# Patient Record
Sex: Male | Born: 1945 | Race: Black or African American | Hispanic: No | Marital: Single | Smoking: Never smoker
Health system: Southern US, Community
[De-identification: ages and names within clinical notes are randomized; demographics above are authoritative.]

## PROBLEM LIST (undated history)

## (undated) DIAGNOSIS — E785 Hyperlipidemia, unspecified: Secondary | ICD-10-CM

## (undated) DIAGNOSIS — I639 Cerebral infarction, unspecified: Secondary | ICD-10-CM

## (undated) DIAGNOSIS — B059 Measles without complication: Secondary | ICD-10-CM

## (undated) DIAGNOSIS — C801 Malignant (primary) neoplasm, unspecified: Secondary | ICD-10-CM

## (undated) DIAGNOSIS — F0390 Unspecified dementia without behavioral disturbance: Secondary | ICD-10-CM

## (undated) DIAGNOSIS — I10 Essential (primary) hypertension: Secondary | ICD-10-CM

## (undated) DIAGNOSIS — B019 Varicella without complication: Secondary | ICD-10-CM

## (undated) DIAGNOSIS — B269 Mumps without complication: Secondary | ICD-10-CM

## (undated) DIAGNOSIS — I519 Heart disease, unspecified: Secondary | ICD-10-CM

## (undated) DIAGNOSIS — F329 Major depressive disorder, single episode, unspecified: Secondary | ICD-10-CM

## (undated) DIAGNOSIS — F32A Depression, unspecified: Secondary | ICD-10-CM

## (undated) DIAGNOSIS — F2089 Other schizophrenia: Secondary | ICD-10-CM

## (undated) HISTORY — DX: Measles without complication: B05.9

## (undated) HISTORY — DX: Depression, unspecified: F32.A

## (undated) HISTORY — DX: Other schizophrenia: F20.89

## (undated) HISTORY — DX: Cerebral infarction, unspecified: I63.9

## (undated) HISTORY — DX: Mumps without complication: B26.9

## (undated) HISTORY — DX: Major depressive disorder, single episode, unspecified: F32.9

## (undated) HISTORY — DX: Varicella without complication: B01.9

## (undated) HISTORY — DX: Heart disease, unspecified: I51.9

---

## 2012-10-23 ENCOUNTER — Encounter (HOSPITAL_BASED_OUTPATIENT_CLINIC_OR_DEPARTMENT_OTHER): Payer: Self-pay | Admitting: *Deleted

## 2012-10-23 ENCOUNTER — Emergency Department (HOSPITAL_BASED_OUTPATIENT_CLINIC_OR_DEPARTMENT_OTHER)
Admission: EM | Admit: 2012-10-23 | Discharge: 2012-10-23 | Disposition: A | Discharge status: Home / Self Care | Payer: Medicare Other | Attending: Emergency Medicine | Admitting: Emergency Medicine

## 2012-10-23 DIAGNOSIS — Z79899 Other long term (current) drug therapy: Secondary | ICD-10-CM | POA: Insufficient documentation

## 2012-10-23 DIAGNOSIS — F3289 Other specified depressive episodes: Secondary | ICD-10-CM | POA: Insufficient documentation

## 2012-10-23 DIAGNOSIS — F0390 Unspecified dementia without behavioral disturbance: Secondary | ICD-10-CM | POA: Insufficient documentation

## 2012-10-23 DIAGNOSIS — I10 Essential (primary) hypertension: Secondary | ICD-10-CM | POA: Insufficient documentation

## 2012-10-23 DIAGNOSIS — F329 Major depressive disorder, single episode, unspecified: Secondary | ICD-10-CM | POA: Insufficient documentation

## 2012-10-23 DIAGNOSIS — E785 Hyperlipidemia, unspecified: Secondary | ICD-10-CM | POA: Insufficient documentation

## 2012-10-23 HISTORY — DX: Hyperlipidemia, unspecified: E78.5

## 2012-10-23 HISTORY — DX: Unspecified dementia, unspecified severity, without behavioral disturbance, psychotic disturbance, mood disturbance, and anxiety: F03.90

## 2012-10-23 HISTORY — DX: Essential (primary) hypertension: I10

## 2012-10-23 LAB — BASIC METABOLIC PANEL
BUN: 11 mg/dL (ref 6–23)
CO2: 28 mEq/L (ref 19–32)
Chloride: 100 mEq/L (ref 96–112)
Creatinine, Ser: 0.8 mg/dL (ref 0.50–1.35)
GFR calc Af Amer: >90 mL/min (ref >90)
Potassium: 3.9 mEq/L (ref 3.5–5.1)

## 2012-10-23 LAB — URINALYSIS, ROUTINE W REFLEX MICROSCOPIC
Bilirubin Urine: NEGATIVE
Glucose, UA: NEGATIVE mg/dL
Ketones, ur: NEGATIVE mg/dL
Leukocytes, UA: NEGATIVE
Nitrite: NEGATIVE
Specific Gravity, Urine: 1.014 (ref 1.005–1.030)
pH: 6.5 (ref 5.0–8.0)

## 2012-10-23 NOTE — ED Provider Notes (Signed)
CSN: [id number]     Arrival date & time 10/23/12  1833 History  This chart was scribed for Ethelda Chick, MD by Bennett Scrape, ED Scribe. This patient was seen in room MH11/MH11 and the patient's care was started at 7:49 PM.   First MD Initiated Contact with Patient 10/23/12 1927     Chief Complaint  Patient presents with  . Hypertension    Patient is a 67 y.o. male presenting with hypertension. The history is provided by the patient. No language interpreter was used.  Hypertension This is a chronic problem. The current episode started less than 1 hour ago. The problem has been gradually improving. Pertinent negatives include no chest pain, no headaches and no shortness of breath. Nothing aggravates the symptoms. Nothing relieves the symptoms. He has tried nothing (ran out of meds today) for the symptoms.    HPI Comments: Ronald [name] is a 67 y.o. male with a h/o HTN who presents to the Emergency Department sent by [facility] for HTN. He states that he was at a regular appointment today and had a BP of 212 systolically. He was told to come to the ED for evaluation. He reports that he ran out of his metoprolol today, but he has the prescription called in and needs to go pick it up. He denies any missed doses. BP in the ED is 188/78. He denies CP, SOB, HA, dizziness and lightheadedness as associated symptoms.   Past Medical History  Diagnosis Date  . Hypertension   . Dementia   . Depressed   . Hyperlipemia    History reviewed. No pertinent past surgical history. History reviewed. No pertinent family history. History  Substance Use Topics  . Smoking status: Never Smoker   . Smokeless tobacco: Not on file  . Alcohol Use: No    Review of Systems  Respiratory: Negative for shortness of breath.   Cardiovascular: Negative for chest pain.  Neurological: Negative for dizziness, light-headedness and headaches.  All other systems reviewed and are  negative.    Allergies  Review of patient's allergies indicates no known allergies.  Home Medications   Current Outpatient Rx  Name  Route  Sig  Dispense  Refill  . ARIPiprazole (ABILIFY) 10 MG tablet   Oral   Take 10 mg by mouth daily.         . metoprolol (LOPRESSOR) 50 MG tablet   Oral   Take 50 mg by mouth 2 (two) times daily.         . simvastatin (ZOCOR) 40 MG tablet   Oral   Take 40 mg by mouth every evening.          Triage Vitals: BP 178/80  Pulse 75  Temp(Src) 98.6 F (37 C) (Oral)  Resp 16  Ht 5\' 7"  (1.702 m)  Wt 250 lb (113.399 kg)  BMI 39.15 kg/m2  SpO2 99%  Physical Exam  Nursing note and vitals reviewed. Constitutional: He is oriented to person, place, and time. He appears well-developed and well-nourished. No distress.  HENT:  Head: Normocephalic and atraumatic.  Eyes: Conjunctivae and EOM are normal.  Neck: Normal range of motion. Neck supple. No tracheal deviation present.  Cardiovascular: Normal rate and regular rhythm.   No murmur heard. Pulmonary/Chest: Effort normal and breath sounds normal. No respiratory distress. He has no wheezes. He has no rales.  Abdominal: Soft. Bowel sounds are normal. There is no tenderness.  Musculoskeletal: Normal range of motion. He exhibits no edema.  Neurological: He is alert and oriented to person, place, and time.  Skin: Skin is warm and dry.  Psychiatric: He has a normal mood and affect. His behavior is normal.    ED Course   Procedures (including critical care time)  DIAGNOSTIC STUDIES: Oxygen Saturation is 99% on room air, normal by my interpretation.    COORDINATION OF CARE: 7:52 PM-Discussed treatment plan which includes BMP and UA with pt at bedside and pt agreed to plan. Advised pt that discharge plan will be picking up prescription.    Date: 10/23/2012  Rate: 62  Rhythm: normal sinus rhythm  QRS Axis: normal  Intervals: normal  ST/T Wave abnormalities: normal  Conduction  Disutrbances: none  Narrative Interpretation: unremarkable, no prior ekg available     Labs Reviewed  BASIC METABOLIC PANEL - Abnormal; Notable for the following:    Glucose, Bld 102 (*)    All other components within normal limits  URINALYSIS, ROUTINE W REFLEX MICROSCOPIC   No results found. 1. Hypertension     MDM  Pt presenting with asymptomatic hypertension.  He was referred to the ED from mental health clinic.  He has rx written and he is going to pick them up later today.  No evidence of end organ damage.  Discharged with strict return precautions.  Pt agreeable with plan.  I personally performed the services described in this documentation, which was scribed in my presence. The recorded information has been reviewed and is accurate.    Ethelda Chick, MD 10/24/12 1534

## 2012-10-23 NOTE — ED Notes (Signed)
Sent here from " mental health" for increased BP

## 2013-02-26 ENCOUNTER — Telehealth: Payer: Self-pay | Admitting: Physician Assistant

## 2013-02-26 ENCOUNTER — Ambulatory Visit (INDEPENDENT_AMBULATORY_CARE_PROVIDER_SITE_OTHER): Payer: Medicare Other | Admitting: Physician Assistant

## 2013-02-26 ENCOUNTER — Encounter: Payer: Self-pay | Admitting: Physician Assistant

## 2013-02-26 VITALS — BP 174/84 | HR 98 | Temp 98.3°F | Resp 18 | Ht 65.25 in | Wt 254.0 lb

## 2013-02-26 DIAGNOSIS — I10 Essential (primary) hypertension: Secondary | ICD-10-CM

## 2013-02-26 DIAGNOSIS — E785 Hyperlipidemia, unspecified: Secondary | ICD-10-CM

## 2013-02-26 DIAGNOSIS — R739 Hyperglycemia, unspecified: Secondary | ICD-10-CM

## 2013-02-26 DIAGNOSIS — R972 Elevated prostate specific antigen [PSA]: Secondary | ICD-10-CM

## 2013-02-26 DIAGNOSIS — Z0000 Encounter for general adult medical examination without abnormal findings: Secondary | ICD-10-CM

## 2013-02-26 DIAGNOSIS — K219 Gastro-esophageal reflux disease without esophagitis: Secondary | ICD-10-CM

## 2013-02-26 DIAGNOSIS — N400 Enlarged prostate without lower urinary tract symptoms: Secondary | ICD-10-CM

## 2013-02-26 DIAGNOSIS — Z23 Encounter for immunization: Secondary | ICD-10-CM

## 2013-02-26 LAB — COMPREHENSIVE METABOLIC PANEL
Albumin: 4.4 g/dL (ref 3.5–5.2)
BUN: 12 mg/dL (ref 6–23)
CO2: 26 mEq/L (ref 19–32)
Calcium: 9.8 mg/dL (ref 8.4–10.5)
Glucose, Bld: 127 mg/dL — ABNORMAL HIGH (ref 70–99)
Potassium: 4.2 mEq/L (ref 3.5–5.3)
Sodium: 136 mEq/L (ref 135–145)
Total Protein: 7.7 g/dL (ref 6.0–8.3)

## 2013-02-26 LAB — CBC WITH DIFFERENTIAL/PLATELET
HCT: 41.2 % (ref 39.0–52.0)
Hemoglobin: 13.9 g/dL (ref 13.0–17.0)
Lymphs Abs: 1.7 10*3/uL (ref 0.7–4.0)
MCH: 29.4 pg (ref 26.0–34.0)
MCHC: 33.7 g/dL (ref 30.0–36.0)
Monocytes Absolute: 0.4 10*3/uL (ref 0.1–1.0)
Monocytes Relative: 6 % (ref 3–12)
Neutro Abs: 4.6 10*3/uL (ref 1.7–7.7)
Neutrophils Relative %: 68 % (ref 43–77)
RBC: 4.72 MIL/uL (ref 4.22–5.81)

## 2013-02-26 LAB — TSH: TSH: 1.133 u[IU]/mL (ref 0.350–4.500)

## 2013-02-26 LAB — PSA, MEDICARE: PSA: 9.82 ng/mL — ABNORMAL HIGH (ref <=4.00)

## 2013-02-26 LAB — LIPID PANEL: Cholesterol: 204 mg/dL — ABNORMAL HIGH (ref 0–200)

## 2013-02-26 MED ORDER — RANITIDINE HCL 150 MG PO CAPS: 150.0000 mg | ORAL_CAPSULE | Freq: Two times a day (BID) | ORAL | Status: DC

## 2013-02-26 MED ORDER — SIMVASTATIN 40 MG PO TABS: 40.0000 mg | ORAL_TABLET | Freq: Every evening | ORAL | Status: DC

## 2013-02-26 MED ORDER — LISINOPRIL 5 MG PO TABS: 5.0000 mg | ORAL_TABLET | Freq: Every day | ORAL | Status: DC

## 2013-02-26 MED ORDER — METOPROLOL TARTRATE 50 MG PO TABS: 50.0000 mg | ORAL_TABLET | Freq: Two times a day (BID) | ORAL | Status: DC

## 2013-02-26 NOTE — Telephone Encounter (Signed)
Please inform patient that his labs showed an elevated glucose level.  I would like to repeat this lab and an A1C in 2 weeks.  Also, patient's LDL cholesterol was slightly elevated, so he should continue Zocor for cholesterol as well as monitor his diet and attempt cardio exercise.  Patient's PSA level is significantly elevated.  I would like for him to schedule an appointment with his Urologist, Dr. Raul Del?), for further evaluation.  He needs to be assessed for BPH or prostate cancer.  If he cannot get in with his doctor, I can make a referral to a different urologist.  It is very important that he sees Urology.

## 2013-02-26 NOTE — Progress Notes (Signed)
Pre visit review using our clinic review tool, if applicable. No additional management support is needed unless otherwise documented below in the visit note/SLS  

## 2013-02-26 NOTE — Patient Instructions (Addendum)
Please obtain labs.  I will call you with your results.  Your medications have been refilled and sent to your pharmacy.  Please follow-up with [facility] Fredric [facility]are at  [facility].  Please read information on high blood pressure and the DASH diet.  Hypertension As your heart beats, it forces blood through your arteries. This force is your blood pressure. If the pressure is too high, it is called hypertension (HTN) or high blood pressure. HTN is dangerous because you may have it and not know it. High blood pressure may mean that your heart has to work harder to pump blood. Your arteries may be narrow or stiff. The extra work puts you at risk for heart disease, stroke, and other problems.  Blood pressure consists of two numbers, a higher number over a lower, 110/72, for example. It is stated as "110 over 72." The ideal is below 120 for the top number (systolic) and under 80 for the bottom (diastolic). Write down your blood pressure today. You should pay close attention to your blood pressure if you have certain conditions such as:  Heart failure.  Prior heart attack.  Diabetes  Chronic kidney disease.  Prior stroke.  Multiple risk factors for heart disease. To see if you have HTN, your blood pressure should be measured while you are seated with your arm held at the level of the heart. It should be measured at least twice. A one-time elevated blood pressure reading (especially in the Emergency Department) does not mean that you need treatment. There may be conditions in which the blood pressure is different between your right and left arms. It is important to see your caregiver soon for a recheck. Most people have essential hypertension which means that there is not a specific cause. This type of high blood pressure may be lowered by changing lifestyle factors such as:  Stress.  Smoking.  Lack of exercise.  Excessive weight.  Drug/tobacco/alcohol use.  Eating less salt. Most people do not  have symptoms from high blood pressure until it has caused damage to the body. Effective treatment can often prevent, delay or reduce that damage. TREATMENT  When a cause has been identified, treatment for high blood pressure is directed at the cause. There are a large number of medications to treat HTN. These fall into several categories, and your caregiver will help you select the medicines that are best for you. Medications may have side effects. You should review side effects with your caregiver. If your blood pressure stays high after you have made lifestyle changes or started on medicines,   Your medication(s) may need to be changed.  Other problems may need to be addressed.  Be certain you understand your prescriptions, and know how and when to take your medicine.  Be sure to follow up with your caregiver within the time frame advised (usually within two weeks) to have your blood pressure rechecked and to review your medications.  If you are taking more than one medicine to lower your blood pressure, make sure you know how and at what times they should be taken. Taking two medicines at the same time can result in blood pressure that is too low. SEEK IMMEDIATE MEDICAL CARE IF:  You develop a severe headache, blurred or changing vision, or confusion.  You have unusual weakness or numbness, or a faint feeling.  You have severe chest or abdominal pain, vomiting, or breathing problems. MAKE SURE YOU:   Understand these instructions.  Will watch your condition.  Will  get help right away if you are not doing well or get worse. Document Released: 03/15/2005 Document Revised: 06/07/2011 Document Reviewed: 11/03/2007 [facility] Patient Information 2014 [place], [place].  DASH Diet The DASH diet stands for "Dietary Approaches to Stop Hypertension." It is a healthy eating plan that has been shown to reduce high blood pressure (hypertension) in as little as 14 days, while also possibly  providing other significant health benefits. These other health benefits include reducing the risk of breast cancer after menopause and reducing the risk of type 2 diabetes, heart disease, colon cancer, and stroke. Health benefits also include weight loss and slowing kidney failure in patients with chronic kidney disease.  DIET GUIDELINES  Limit salt (sodium). Your diet should contain less than 1500 mg of sodium daily.  Limit refined or processed carbohydrates. Your diet should include mostly whole grains. Desserts and added sugars should be used sparingly.  Include small amounts of heart-healthy fats. These types of fats include nuts, oils, and tub margarine. Limit saturated and trans fats. These fats have been shown to be harmful in the body. CHOOSING FOODS  The following food groups are based on a 2000 calorie diet. See your Registered Dietitian for individual calorie needs. Grains and Grain Products (6 to 8 servings daily)  Eat More Often: Whole-wheat bread, brown rice, whole-grain or wheat pasta, quinoa, popcorn without added fat or salt (air popped).  Eat Less Often: White bread, white pasta, white rice, cornbread. Vegetables (4 to 5 servings daily)  Eat More Often: Fresh, frozen, and canned vegetables. Vegetables may be raw, steamed, roasted, or grilled with a minimal amount of fat.  Eat Less Often/Avoid: Creamed or fried vegetables. Vegetables in a cheese sauce. Fruit (4 to 5 servings daily)  Eat More Often: All fresh, canned (in natural juice), or frozen fruits. Dried fruits without added sugar. One hundred percent fruit juice ( cup [237 mL] daily).  Eat Less Often: Dried fruits with added sugar. Canned fruit in light or heavy syrup. [employer], Fish, and Poultry (2 servings or less daily. One serving is 3 to 4 oz [85-114 g]).  Eat More Often: Ninety percent or leaner ground beef, tenderloin, sirloin. Round cuts of beef, chicken breast, [place] breast. All fish. Grill, bake, or  broil your meat. Nothing should be fried.  Eat Less Often/Avoid: Fatty cuts of meat, [place], or chicken leg, thigh, or wing. Fried cuts of meat or fish. Dairy (2 to 3 servings)  Eat More Often: Low-fat or fat-free milk, low-fat plain or light yogurt, reduced-fat or part-skim cheese.  Eat Less Often/Avoid: Milk (whole, 2%).Whole milk yogurt. Full-fat cheeses. Nuts, Seeds, and Legumes (4 to 5 servings per week)  Eat More Often: All without added salt.  Eat Less Often/Avoid: Salted nuts and seeds, canned beans with added salt. Fats and Sweets (limited)  Eat More Often: Vegetable oils, tub margarines without trans fats, sugar-free gelatin. Mayonnaise and salad dressings.  Eat Less Often/Avoid: Coconut oils, palm oils, butter, stick margarine, cream, half and half, cookies, candy, pie. FOR MORE INFORMATION The Dash Diet Eating Plan: www.dashdiet.org Document Released: 03/04/2011 Document Revised: 06/07/2011 Document Reviewed: 03/04/2011 [facility] Patient Information 2014 [place], [place].

## 2013-02-26 NOTE — Progress Notes (Signed)
Patient ID: Ronald [name], male   DOB: 1945-08-31, 67 y.o.   MRN: [id number]  Subjective:   Patient here for Medicare annual wellness visit and management of other chronic and acute problems.    Risk factors:   -- Hypertension, Hyperlipidemia  Chronic Issues: (1) Hypertension -- reports being out of medication for a few months.  Denies chest pain, palpitations, vision changes, LH, syncope.  BP elevated at 174/84  (2) Hyperlipidemia -- currently on zocor 40 mg, but has been out of medication for a few months.  (3) Dementia -- followed by Neurology and Mental Health clinic for associated depression.  Is currently on Abilify and doing well on medication  (4) BPH -- Patient endorses history of enlarged prostate.  States he is followed by Urology, Dr. Lindley Magnus who is monitoring his levels.  Will need records from Urology.  Patient overdue for PSA.  Patient denies symptoms at present.   Roster of Physicians Providing Medical Care to Patient: Dr. Lindley Magnus (Urology) Dr. Maudry Diego (Gastroenterology) Marcelline Mates, PA-C (Primary Care) Sees Neurologist.  Activities of Daily Living  In your present state of health, do you have any difficulty performing the following activities? Preparing food and eating?: No  Bathing yourself: No  Getting dressed: No  Using the toilet:No  Moving around from place to place: No  In the past year have you fallen or had a near fall?:No     Home Safety: Has smoke detector and wears seat belts. 1 firearm in the home.  Is kept locked up.  No children in home. No excess sun exposure.  Diet and Exercise  Current exercise habits: Walks when the weather is warmer; has exercise tape that he uses occasionally Dietary issues discussed: Endorses well-balanced diet.  A lot of fish and chicken in diet.  Depression Screen  (Note: if answer to either of the following is "Yes", then a more complete depression screening is indicated)  Q1: Over the past two weeks, have you felt down,  depressed or hopeless? no  Q2: Over the past two weeks, have you felt little interest or pleasure in doing things?  no   The following portions of the patient's history were reviewed and updated as appropriate: allergies, current medications, past family history, past medical history, past social history, past surgical history and problem list.   Review of Systems  Review of Systems  Constitutional: Negative for fever, weight loss and malaise/fatigue.  HENT: Negative for ear discharge, ear pain, hearing loss and tinnitus.   Eyes: Negative for blurred vision, double vision, photophobia and pain.  Respiratory: Negative for cough, shortness of breath and wheezing.   Cardiovascular: Negative for chest pain and palpitations.  Gastrointestinal: Negative for heartburn, nausea, vomiting, abdominal pain, diarrhea, constipation, blood in stool and melena.  Genitourinary: Negative for dysuria, urgency, frequency, hematuria and flank pain.  Musculoskeletal: Negative for myalgias.  Neurological: Negative for dizziness, seizures, loss of consciousness and headaches.  Psychiatric/Behavioral: Positive for depression and memory loss. Negative for suicidal ideas, hallucinations and substance abuse. The patient is not nervous/anxious and does not have insomnia.     Objective:   Vision: R -- 20/30; L -- 20/40; B -- 20/30 Uncorrected. Hearing: Patient can hear a forced whisper from across the room Body mass index: Body mass index is 41.96 kg/(m^2). Cognitive Impairment Assessment: cognition, memory and judgment appear normal.   Physical Exam  Vitals reviewed. Constitutional: He is oriented to person, place, and time.  Patient is a morbidly obese, pleasant african [place] gentleman,  in no acute distress.  HENT:  Head: Normocephalic and atraumatic.  Right Ear: External ear normal.  Left Ear: External ear normal.  Nose: Nose normal.  Mouth/Throat: Oropharynx is clear and moist. No oropharyngeal exudate.   Tympanic membranes within normal limits bilaterally.  Eyes: Conjunctivae and EOM are normal. Pupils are equal, round, and reactive to light. No scleral icterus.  Neck: Neck supple.  Cardiovascular: Normal rate, regular rhythm, normal heart sounds and intact distal pulses.   Pulmonary/Chest: Effort normal and breath sounds normal. No respiratory distress. He has no wheezes. He has no rales. He exhibits no tenderness.  Abdominal: Soft. Bowel sounds are normal. He exhibits no distension and no mass. There is no tenderness. There is no rebound and no guarding.  Genitourinary:  Patient defers prostate and GU exam to his Urologist.  Lymphadenopathy:    He has no cervical adenopathy.  Neurological: He is alert and oriented to person, place, and time. No cranial nerve deficit. GCS score is 15.  Skin: Skin is warm and dry. No rash noted.  Psychiatric: Mood, memory and affect normal.   Assessment:   Medicare wellness utd on preventive parameters   (1) Hypertension (2) Hyperlipidemia (3) Dementia (4) Hx of BPH  Plan:    During the course of the visit the patient was educated and counseled about appropriate screening and preventive services including:        Fall prevention   Screening PSA Diabetes screening  Nutrition counseling   (1) BP markedly elevated at 178/86 today.  Patient asymptomatic.  Endorses has been out of medication for a couple of months.  Medications refilled.  Patient instructed to restart medications as prescribed.  DASH diet handout given to patient.  Patient counseled on weight loss.  Patient to return to office for repeat BP check in   (2) Will obtain fasting lipid panel.  Medications refilled.  (3) Patient is A/O x 3.  Judgement and Insight intact.  Will continue medication as prescribed by Neurologist.  (4) Patient followed by Urology.  Will request records.  Patient agrees to allowing [place] to check PSA since he is overdue.   Vaccines / LABS Flu vaccine  today. Will obtain CBC, CMP, Lipid, UA and PSA.  Patient Instructions (the written plan) was given to the patient.

## 2013-02-27 NOTE — Telephone Encounter (Signed)
Left message on voicemail for pt to return my call.

## 2013-02-28 ENCOUNTER — Encounter: Payer: Self-pay | Admitting: Physician Assistant

## 2013-02-28 DIAGNOSIS — Z23 Encounter for immunization: Secondary | ICD-10-CM | POA: Insufficient documentation

## 2013-02-28 DIAGNOSIS — Z0000 Encounter for general adult medical examination without abnormal findings: Secondary | ICD-10-CM | POA: Insufficient documentation

## 2013-02-28 DIAGNOSIS — E785 Hyperlipidemia, unspecified: Secondary | ICD-10-CM | POA: Insufficient documentation

## 2013-02-28 DIAGNOSIS — R972 Elevated prostate specific antigen [PSA]: Secondary | ICD-10-CM

## 2013-02-28 DIAGNOSIS — N400 Enlarged prostate without lower urinary tract symptoms: Secondary | ICD-10-CM | POA: Insufficient documentation

## 2013-02-28 DIAGNOSIS — I10 Essential (primary) hypertension: Secondary | ICD-10-CM | POA: Insufficient documentation

## 2013-02-28 NOTE — Assessment & Plan Note (Addendum)
Medications refilled.  Patient to restart medications as prescribed.  BP recheck in 2 weeks.  Gave handout on DASH diet.  Encourage diet, exercise and weight loss.

## 2013-02-28 NOTE — Assessment & Plan Note (Signed)
Will obtain fasting lipid panel.  zocor refilled.

## 2013-02-28 NOTE — Telephone Encounter (Addendum)
Patient informed, understood & agreed;OV note w/labs faxed to [facility] at [phone]; future A1C lab placed/SLS

## 2013-02-28 NOTE — Assessment & Plan Note (Signed)
History updated.  Medications refilled.  Will obtain labs.

## 2013-02-28 NOTE — Assessment & Plan Note (Signed)
Will obtain PSA

## 2013-02-28 NOTE — Assessment & Plan Note (Signed)
Flu shot given by nursing staff. 

## 2013-03-09 ENCOUNTER — Telehealth: Payer: Self-pay | Admitting: Physician Assistant

## 2013-03-09 LAB — HEMOGLOBIN A1C
Hgb A1c MFr Bld: 5.8 % — ABNORMAL HIGH (ref <5.7)
Mean Plasma Glucose: 120 mg/dL — ABNORMAL HIGH (ref <117)

## 2013-03-09 NOTE — Telephone Encounter (Signed)
HE WOULD LIKE TO [facility] AT [facility]

## 2013-03-09 NOTE — Telephone Encounter (Signed)
Pt wants to see the therapist at [facility], he just wants someone to offer counseling services, pt is aware that they don't prescribe any medication.

## 2013-03-09 NOTE — Telephone Encounter (Signed)
For counseling services, the patient needs to set up an appointment on his own.  The number is [phone].  Patient should tell scheduler he wants to see a counselor at the [place] office.

## 2013-03-09 NOTE — Telephone Encounter (Signed)
Can we call patient and see if he is wanting referral to see a therapist or for medication management.  To my knowledge, [facility] only offers counseling services.  He will need to be seen by a psychiatrist if he is wishing for medication management.  I will make a referral for him, but I need to know what services he is requesting.

## 2013-03-12 ENCOUNTER — Encounter: Payer: Self-pay | Admitting: Physician Assistant

## 2013-03-12 ENCOUNTER — Ambulatory Visit (INDEPENDENT_AMBULATORY_CARE_PROVIDER_SITE_OTHER): Payer: Medicare Other | Admitting: Physician Assistant

## 2013-03-12 VITALS — BP 158/88 | HR 82 | Temp 98.0°F | Resp 18 | Wt 258.0 lb

## 2013-03-12 DIAGNOSIS — I10 Essential (primary) hypertension: Secondary | ICD-10-CM

## 2013-03-12 MED ORDER — LISINOPRIL 10 MG PO TABS: 10.0000 mg | ORAL_TABLET | Freq: Every day | ORAL | Status: DC

## 2013-03-12 NOTE — Assessment & Plan Note (Signed)
Increase lisinopril to 10 mg daily.  Continue DASH diet.  Follow-up in 2 weeks for BP recheck.

## 2013-03-12 NOTE — Patient Instructions (Signed)
Please take two 5-mg tablets a day of your current Lisinopril prescription.  When you pick up your next prescription, it will be a bottle of 10 mg tablets.  Take 1 daily.  Return to clinic in 2 weeks for blood pressure recheck.  Please have your records sent from Mental Health so we can assess your need for medication and referral to Behavioral Health here.

## 2013-03-12 NOTE — Progress Notes (Signed)
Patient ID: Ronald [name], male   DOB: November 02, 1945, 67 y.o.   MRN: [id number]  Patient presents to clinic today for 2 week follow-up of HTN.  Patient's BP at last visit was elevated at 174/84.  Patient currently on Lisinopril 5 mg daily and Metoprolol 50 mg BID.  Patient denies chest pain, palpitations, SOB, vision changes, headache, LH or syncope.  Patient's BP elevated at 158/88 today in clinic.     Past Medical History  Diagnosis Date  . Hypertension   . Dementia   . Depressed   . Hyperlipemia   . Chicken pox   . Mumps   . Measles     Current Outpatient Prescriptions on File Prior to Visit  Medication Sig Dispense Refill  . ARIPiprazole (ABILIFY) 10 MG tablet Take 10 mg by mouth daily.      . metoprolol (LOPRESSOR) 50 MG tablet Take 1 tablet (50 mg total) by mouth 2 (two) times daily.  60 tablet  2  . ranitidine (ZANTAC) 150 MG capsule Take 1 capsule (150 mg total) by mouth 2 (two) times daily.  60 capsule  2  . simvastatin (ZOCOR) 40 MG tablet Take 1 tablet (40 mg total) by mouth every evening.  30 tablet  0   No current facility-administered medications on file prior to visit.    No Known Allergies  Family History  Problem Relation Age of Onset  . Hypertension Father   . Hyperlipidemia Father   . Congestive Heart Failure Father 51    Deceased-1995  . Diabetes Mother   . Kidney failure Mother 76    Deceased-2005  . Hypertension Mother   . Congestive Heart Failure Maternal Grandmother   . Heart failure Paternal Grandmother   . Heart attack Paternal Grandmother     History   Social History  . Marital Status: Single    Spouse Name: N/A    Number of Children: N/A  . Years of Education: N/A   Social History Main Topics  . Smoking status: Never Smoker   . Smokeless tobacco: Never Used  . Alcohol Use: No  . Drug Use: No  . Sexual Activity: No   Other Topics Concern  . None   Social History Narrative  . None    Review of Systems - See HPI.  All other ROS  are negative.   Filed Vitals:   03/12/13 0903  BP: 158/88  Pulse: 82  Temp: 98 F (36.7 C)  Resp: 18   Physical Exam  Vitals reviewed. Constitutional: He is oriented to person, place, and time and well-developed, well-nourished, and in no distress.  HENT:  Head: Normocephalic.  Eyes: Conjunctivae are normal. Pupils are equal, round, and reactive to light.  Neck: Neck supple.  Cardiovascular: Normal rate, regular rhythm, normal heart sounds and intact distal pulses.   Pulmonary/Chest: Effort normal and breath sounds normal.  Neurological: He is alert and oriented to person, place, and time.  Skin: Skin is warm and dry. No rash noted.  Psychiatric: Affect normal.     Recent Results (from the past 2160 hour(s))  CBC WITH DIFFERENTIAL     Status: None   Collection Time    02/26/13 10:30 AM      Result Value Range   WBC 6.7  4.0 - 10.5 K/uL   RBC 4.72  4.22 - 5.81 MIL/uL   Hemoglobin 13.9  13.0 - 17.0 g/dL   HCT 40.9  81.1 - 91.4 %   MCV 87.3  78.0 - 100.0  fL   MCH 29.4  26.0 - 34.0 pg   MCHC 33.7  30.0 - 36.0 g/dL   RDW 14.7  82.9 - 56.2 %   Platelets 265  150 - 400 K/uL   Neutrophils Relative % 68  43 - 77 %   Neutro Abs 4.6  1.7 - 7.7 K/uL   Lymphocytes Relative 26  12 - 46 %   Lymphs Abs 1.7  0.7 - 4.0 K/uL   Monocytes Relative 6  3 - 12 %   Monocytes Absolute 0.4  0.1 - 1.0 K/uL   Eosinophils Relative 0  0 - 5 %   Eosinophils Absolute 0.0  0.0 - 0.7 K/uL   Basophils Relative 0  0 - 1 %   Basophils Absolute 0.0  0.0 - 0.1 K/uL   Smear Review Criteria for review not met    COMPREHENSIVE METABOLIC PANEL     Status: Abnormal   Collection Time    02/26/13 10:30 AM      Result Value Range   Sodium 136  135 - 145 mEq/L   Potassium 4.2  3.5 - 5.3 mEq/L   Chloride 96  96 - 112 mEq/L   CO2 26  19 - 32 mEq/L   Glucose, Bld 127 (*) 70 - 99 mg/dL   BUN 12  6 - 23 mg/dL   Creat 1.30  8.65 - 7.84 mg/dL   Total Bilirubin 0.4  0.3 - 1.2 mg/dL   Alkaline Phosphatase 101   39 - 117 U/L   AST 30  0 - 37 U/L   ALT 33  0 - 53 U/L   Total Protein 7.7  6.0 - 8.3 g/dL   Albumin 4.4  3.5 - 5.2 g/dL   Calcium 9.8  8.4 - 69.6 mg/dL  TSH     Status: None   Collection Time    02/26/13 10:30 AM      Result Value Range   TSH 1.133  0.350 - 4.500 uIU/mL  PSA, MEDICARE     Status: Abnormal   Collection Time    02/26/13 10:30 AM      Result Value Range   PSA 9.82 (*) <=4.00 ng/mL   Comment: Test Methodology: ECLIA PSA (Electrochemiluminescence Immunoassay)           For PSA values from 2.5-4.0, particularly in younger men <60 years     old, the AUA and NCCN suggest testing for % Free PSA (3515) and     evaluation of the rate of increase in PSA (PSA velocity).  LIPID PANEL     Status: Abnormal   Collection Time    02/26/13 10:30 AM      Result Value Range   Cholesterol 204 (*) 0 - 200 mg/dL   Comment: ATP III Classification:           < 200        mg/dL        Desirable          200 - 239     mg/dL        Borderline High          >= 240        mg/dL        High         Triglycerides 119  <150 mg/dL   HDL 46  >29 mg/dL   Total CHOL/HDL Ratio 4.4     VLDL 24  0 - 40 mg/dL  LDL Cholesterol 134 (*) 0 - 99 mg/dL   Comment:       Total Cholesterol/HDL Ratio:CHD Risk                            Coronary Heart Disease Risk Table                                            Men       Women              1/2 Average Risk              3.4        3.3                  Average Risk              5.0        4.4               2X Average Risk              9.6        7.1               3X Average Risk             23.4       11.0     Use the calculated Patient Ratio above and the CHD Risk table      to determine the patient's CHD Risk.     ATP III Classification (LDL):           < 100        mg/dL         Optimal          100 - 129     mg/dL         Near or Above Optimal          130 - 159     mg/dL         Borderline High          160 - 189     mg/dL         High            > 190        mg/dL         Very High        HEMOGLOBIN A1C     Status: Abnormal   Collection Time    03/09/13  9:31 AM      Result Value Range   Hemoglobin A1C 5.8 (*) <5.7 %   Comment:                                                                            According to the ADA Clinical Practice Recommendations for 2011, when     HbA1c is used as a screening test:             >=6.5%   Diagnostic of Diabetes Mellitus                (  if abnormal result is confirmed)           5.7-6.4%   Increased risk of developing Diabetes Mellitus           References:Diagnosis and Classification of Diabetes Mellitus,Diabetes     Care,2011,34(Suppl 1):S62-S69 and Standards of Medical Care in             Diabetes - 2011,Diabetes Care,2011,34 (Suppl 1):S11-S61.         Mean Plasma Glucose 120 (*) <117 mg/dL    Assessment/Plan: Essential hypertension, benign Increase lisinopril to 10 mg daily.  Continue DASH diet.  Follow-up in 2 weeks for BP recheck.

## 2013-03-13 NOTE — Telephone Encounter (Signed)
Notified pt and he voices understanding. 

## 2013-03-26 ENCOUNTER — Ambulatory Visit (INDEPENDENT_AMBULATORY_CARE_PROVIDER_SITE_OTHER): Payer: Medicare Other | Admitting: Physician Assistant

## 2013-03-26 ENCOUNTER — Encounter: Payer: Self-pay | Admitting: Physician Assistant

## 2013-03-26 VITALS — BP 140/84 | HR 71 | Temp 98.2°F | Resp 18 | Ht 65.25 in | Wt 253.5 lb

## 2013-03-26 DIAGNOSIS — I10 Essential (primary) hypertension: Secondary | ICD-10-CM

## 2013-03-26 NOTE — Patient Instructions (Signed)
Please continue medications as prescribed.  Return to clinic in 3 months.  Return sooner if you need [place].

## 2013-03-26 NOTE — Progress Notes (Signed)
Pre visit review using our clinic review tool, if applicable. No additional management support is needed unless otherwise documented below in the visit note/SLS  

## 2013-03-27 NOTE — Progress Notes (Signed)
Patient ID: Ronald [name], male   DOB: 10-08-1945, 67 y.o.   MRN: [id number]  Patient presents to clinic today for follow up of hypertension after medication change. Patient is currently taking lisinopril 10 mg daily and metoprolol 50 mg twice a day. Patient endorses taking medications as prescribed. BP at last visit was 158/88.  BP in clinic today is 140/84. Patient denies chest pain, palpitations, shortness of breath, lightheadedness, dizziness or vision changes.  Past Medical History  Diagnosis Date  . Hypertension   . Dementia   . Depressed   . Hyperlipemia   . Chicken pox   . Mumps   . Measles     Current Outpatient Prescriptions on File Prior to Visit  Medication Sig Dispense Refill  . ARIPiprazole (ABILIFY) 10 MG tablet Take 10 mg by mouth daily.      Marland Kitchen lisinopril (PRINIVIL,ZESTRIL) 10 MG tablet Take 1 tablet (10 mg total) by mouth daily.  30 tablet  2  . metoprolol (LOPRESSOR) 50 MG tablet Take 1 tablet (50 mg total) by mouth 2 (two) times daily.  60 tablet  2  . ranitidine (ZANTAC) 150 MG capsule Take 1 capsule (150 mg total) by mouth 2 (two) times daily.  60 capsule  2  . simvastatin (ZOCOR) 40 MG tablet Take 1 tablet (40 mg total) by mouth every evening.  30 tablet  0   No current facility-administered medications on file prior to visit.    No Known Allergies  Family History  Problem Relation Age of Onset  . Hypertension Father   . Hyperlipidemia Father   . Congestive Heart Failure Father 29    Deceased-1995  . Diabetes Mother   . Kidney failure Mother 6    Deceased-2005  . Hypertension Mother   . Congestive Heart Failure Maternal Grandmother   . Heart failure Paternal Grandmother   . Heart attack Paternal Grandmother     History   Social History  . Marital Status: Single    Spouse Name: N/A    Number of Children: N/A  . Years of Education: N/A   Social History Main Topics  . Smoking status: Never Smoker   . Smokeless tobacco: Never Used  . Alcohol  Use: No  . Drug Use: No  . Sexual Activity: No   Other Topics Concern  . None   Social History Narrative  . None   Review of Systems - See HPI.  All other ROS are negative.  Filed Vitals:   03/26/13 1011  BP: 140/84  Pulse: 71  Temp: 98.2 F (36.8 C)  Resp: 18   Physical Exam  Vitals reviewed. Constitutional: He is oriented to person, place, and time and well-developed, well-nourished, and in no distress.  HENT:  Head: Normocephalic and atraumatic.  Eyes: Conjunctivae are normal. Pupils are equal, round, and reactive to light.  Cardiovascular: Normal rate, regular rhythm, normal heart sounds and intact distal pulses.   Pulmonary/Chest: Effort normal and breath sounds normal. No respiratory distress. He has no wheezes. He has no rales. He exhibits no tenderness.  Neurological: He is alert and oriented to person, place, and time.  Skin: Skin is warm and dry. No rash noted.  Psychiatric: Affect normal.    Recent Results (from the past 2160 hour(s))  CBC WITH DIFFERENTIAL     Status: None   Collection Time    02/26/13 10:30 AM      Result Value Range   WBC 6.7  4.0 - 10.5 K/uL   RBC  4.72  4.22 - 5.81 MIL/uL   Hemoglobin 13.9  13.0 - 17.0 g/dL   HCT 45.4  09.8 - 11.9 %   MCV 87.3  78.0 - 100.0 fL   MCH 29.4  26.0 - 34.0 pg   MCHC 33.7  30.0 - 36.0 g/dL   RDW 14.7  82.9 - 56.2 %   Platelets 265  150 - 400 K/uL   Neutrophils Relative % 68  43 - 77 %   Neutro Abs 4.6  1.7 - 7.7 K/uL   Lymphocytes Relative 26  12 - 46 %   Lymphs Abs 1.7  0.7 - 4.0 K/uL   Monocytes Relative 6  3 - 12 %   Monocytes Absolute 0.4  0.1 - 1.0 K/uL   Eosinophils Relative 0  0 - 5 %   Eosinophils Absolute 0.0  0.0 - 0.7 K/uL   Basophils Relative 0  0 - 1 %   Basophils Absolute 0.0  0.0 - 0.1 K/uL   Smear Review Criteria for review not met    COMPREHENSIVE METABOLIC PANEL     Status: Abnormal   Collection Time    02/26/13 10:30 AM      Result Value Range   Sodium 136  135 - 145 mEq/L    Potassium 4.2  3.5 - 5.3 mEq/L   Chloride 96  96 - 112 mEq/L   CO2 26  19 - 32 mEq/L   Glucose, Bld 127 (*) 70 - 99 mg/dL   BUN 12  6 - 23 mg/dL   Creat 1.30  8.65 - 7.84 mg/dL   Total Bilirubin 0.4  0.3 - 1.2 mg/dL   Alkaline Phosphatase 101  39 - 117 U/L   AST 30  0 - 37 U/L   ALT 33  0 - 53 U/L   Total Protein 7.7  6.0 - 8.3 g/dL   Albumin 4.4  3.5 - 5.2 g/dL   Calcium 9.8  8.4 - 69.6 mg/dL  TSH     Status: None   Collection Time    02/26/13 10:30 AM      Result Value Range   TSH 1.133  0.350 - 4.500 uIU/mL  PSA, MEDICARE     Status: Abnormal   Collection Time    02/26/13 10:30 AM      Result Value Range   PSA 9.82 (*) <=4.00 ng/mL   Comment: Test Methodology: ECLIA PSA (Electrochemiluminescence Immunoassay)           For PSA values from 2.5-4.0, particularly in younger men <60 years     old, the AUA and NCCN suggest testing for % Free PSA (3515) and     evaluation of the rate of increase in PSA (PSA velocity).  LIPID PANEL     Status: Abnormal   Collection Time    02/26/13 10:30 AM      Result Value Range   Cholesterol 204 (*) 0 - 200 mg/dL   Comment: ATP III Classification:           < 200        mg/dL        Desirable          200 - 239     mg/dL        Borderline High          >= 240        mg/dL        High  Triglycerides 119  <150 mg/dL   HDL 46  >16 mg/dL   Total CHOL/HDL Ratio 4.4     VLDL 24  0 - 40 mg/dL   LDL Cholesterol 109 (*) 0 - 99 mg/dL   Comment:       Total Cholesterol/HDL Ratio:CHD Risk                            Coronary Heart Disease Risk Table                                            Men       Women              1/2 Average Risk              3.4        3.3                  Average Risk              5.0        4.4               2X Average Risk              9.6        7.1               3X Average Risk             23.4       11.0     Use the calculated Patient Ratio above and the CHD Risk table      to determine the patient's CHD  Risk.     ATP III Classification (LDL):           < 100        mg/dL         Optimal          100 - 129     mg/dL         Near or Above Optimal          130 - 159     mg/dL         Borderline High          160 - 189     mg/dL         High           > 190        mg/dL         Very High        HEMOGLOBIN A1C     Status: Abnormal   Collection Time    03/09/13  9:31 AM      Result Value Range   Hemoglobin A1C 5.8 (*) <5.7 %   Comment:                                                                            According to the ADA Clinical Practice Recommendations for 2011, when  HbA1c is used as a screening test:             >=6.5%   Diagnostic of Diabetes Mellitus                (if abnormal result is confirmed)           5.7-6.4%   Increased risk of developing Diabetes Mellitus           References:Diagnosis and Classification of Diabetes Mellitus,Diabetes     Care,2011,34(Suppl 1):S62-S69 and Standards of Medical Care in             Diabetes - 2011,Diabetes Care,2011,34 (Suppl 1):S11-S61.         Mean Plasma Glucose 120 (*) <117 mg/dL    Assessment/Plan: No problem-specific assessment & plan notes found for this encounter.

## 2013-03-27 NOTE — Assessment & Plan Note (Signed)
Continue medications as prescribed. Continue diet and lifestyle changes.

## 2013-04-09 ENCOUNTER — Other Ambulatory Visit: Payer: Self-pay | Admitting: Physician Assistant

## 2013-04-09 NOTE — Telephone Encounter (Signed)
Rx request to pharmacy/[facility]  

## 2013-05-08 ENCOUNTER — Telehealth: Payer: Self-pay | Admitting: Physician Assistant

## 2013-05-08 NOTE — Telephone Encounter (Signed)
Received medical records from [facility]

## 2013-05-14 ENCOUNTER — Encounter: Payer: Self-pay | Admitting: *Deleted

## 2013-06-20 ENCOUNTER — Other Ambulatory Visit: Payer: Self-pay | Admitting: Physician Assistant

## 2013-06-25 ENCOUNTER — Ambulatory Visit: Payer: Medicare Other | Admitting: Physician Assistant

## 2013-06-28 ENCOUNTER — Ambulatory Visit: Payer: Medicare Other | Admitting: Physician Assistant

## 2013-07-04 ENCOUNTER — Telehealth: Payer: Self-pay | Admitting: Physician Assistant

## 2013-07-04 ENCOUNTER — Ambulatory Visit (INDEPENDENT_AMBULATORY_CARE_PROVIDER_SITE_OTHER): Payer: Medicare Other | Admitting: Physician Assistant

## 2013-07-04 ENCOUNTER — Encounter: Payer: Self-pay | Admitting: Physician Assistant

## 2013-07-04 VITALS — BP 174/100 | HR 62 | Temp 99.1°F | Resp 18 | Ht 65.25 in | Wt 262.0 lb

## 2013-07-04 DIAGNOSIS — E785 Hyperlipidemia, unspecified: Secondary | ICD-10-CM

## 2013-07-04 DIAGNOSIS — K219 Gastro-esophageal reflux disease without esophagitis: Secondary | ICD-10-CM

## 2013-07-04 DIAGNOSIS — I10 Essential (primary) hypertension: Secondary | ICD-10-CM

## 2013-07-04 DIAGNOSIS — R972 Elevated prostate specific antigen [PSA]: Secondary | ICD-10-CM

## 2013-07-04 MED ORDER — RANITIDINE HCL 150 MG PO CAPS: ORAL_CAPSULE | ORAL | Status: DC

## 2013-07-04 MED ORDER — LISINOPRIL 10 MG PO TABS: 10.0000 mg | ORAL_TABLET | Freq: Every day | ORAL | Status: DC

## 2013-07-04 MED ORDER — METOPROLOL TARTRATE 50 MG PO TABS: ORAL_TABLET | ORAL | Status: DC

## 2013-07-04 MED ORDER — SIMVASTATIN 40 MG PO TABS: ORAL_TABLET | ORAL | Status: DC

## 2013-07-04 NOTE — Assessment & Plan Note (Signed)
Medications refilled

## 2013-07-04 NOTE — Telephone Encounter (Signed)
Relevant patient education mailed to patient.  

## 2013-07-04 NOTE — Assessment & Plan Note (Signed)
BP significantly elevated but asymptomatic.  Physical exam unremarkable.  Medications refilled. Patient instructed to take a Lisinopril as soon as he gets medication filled.  Take 2nd dose of Metoprolol this evening as directed.  Follow-up in 2 weeks for BP recheck.  Patient educated on alarm signs/symptoms and when to call 911 or proceed to ER.  Patient voices understanding.

## 2013-07-04 NOTE — Progress Notes (Signed)
Patient presents to clinic today for follow-up of hypertension.  Hypertension has been controlled previously with Metoprolol 50 mg BID and Lisinopril 10 mg daily.  Patient has been out of Lisinopril for 1 month.  BP significantly elevated at 174/100 today.  Patient denies chest pain, headaches, shortness of breath, lightheadedness, dizziness or palpitations.   Patient with elevated PSA ~ 9 at last office visit.  Patient had stated he was followed by [facility]. Records had been sent to urologist after abnormal result was found.  Patient instructed to set up appointment with Urology.  It is now 98-months later and patient states he has not set up an appointment with Urology.    Past Medical History  Diagnosis Date  . Hypertension   . Dementia   . Depressed   . Hyperlipemia   . Chicken pox   . Mumps   . Measles   . Schizophrenia simplex     Symptoms w/depression    Current Outpatient Prescriptions on File Prior to Visit  Medication Sig Dispense Refill  . ARIPiprazole (ABILIFY) 10 MG tablet Take 10 mg by mouth daily.       No current facility-administered medications on file prior to visit.    No Known Allergies  Family History  Problem Relation Age of Onset  . Hypertension Father   . Hyperlipidemia Father   . Congestive Heart Failure Father 38    Deceased-1995  . Diabetes Mother   . Kidney failure Mother 67    Deceased-2005  . Hypertension Mother   . Congestive Heart Failure Maternal Grandmother   . Heart failure Paternal Grandmother   . Heart attack Paternal Grandmother     History   Social History  . Marital Status: Single    Spouse Name: N/A    Number of Children: N/A  . Years of Education: N/A   Social History Main Topics  . Smoking status: Never Smoker   . Smokeless tobacco: Never Used  . Alcohol Use: No  . Drug Use: No  . Sexual Activity: No   Other Topics Concern  . Not on file   Social History Narrative  . No narrative on file   Review of  Systems - See HPI.  All other ROS are negative.  BP 174/100  Pulse 62  Temp(Src) 99.1 F (37.3 C) (Oral)  Resp 18  Ht 5' 5.25" (1.657 m)  Wt 262 lb (118.842 kg)  BMI 43.28 kg/m2  SpO2 98%  Physical Exam  Vitals reviewed. Constitutional: He is oriented to person, place, and time and well-developed, well-nourished, and in no distress.  HENT:  Head: Normocephalic and atraumatic.  Right Ear: External ear normal.  Left Ear: External ear normal.  Nose: Nose normal.  Mouth/Throat: Oropharynx is clear and moist. No oropharyngeal exudate.  Eyes: Conjunctivae and EOM are normal. Pupils are equal, round, and reactive to light.  Neck: Neck supple.  Cardiovascular: Normal rate, regular rhythm, normal heart sounds and intact distal pulses.   Pulmonary/Chest: Effort normal and breath sounds normal. No respiratory distress. He has no wheezes. He has no rales. He exhibits no tenderness.  Neurological: He is alert and oriented to person, place, and time. No cranial nerve deficit.  Skin: Skin is warm and dry. No rash noted.  Psychiatric: Affect normal.   Assessment/Plan: Essential hypertension, benign BP significantly elevated but asymptomatic.  Physical exam unremarkable.  Medications refilled. Patient instructed to take a Lisinopril as soon as he gets medication filled.  Take 2nd dose of Metoprolol  this evening as directed.  Follow-up in 2 weeks for BP recheck.  Patient educated on alarm signs/symptoms and when to call 911 or proceed to ER.  Patient voices understanding.  GERD (gastroesophageal reflux disease) Medications refilled.  Elevated PSA, less than 10 ng/ml Patient has not seen his Urologist as directed.  Called Urologist to see about setting up appointment, they state patient has not been seen in several years and is no longer a patient.  Urgent referral placed to [facility] for further evaluation and treatment.  Hyperlipidemia Medication refilled.  Due for repeat Lipid Panel in  3 months.

## 2013-07-04 NOTE — Patient Instructions (Addendum)
Please restart Lisinopril.  Continue other medications as directed.  Follow-up in 2 weeks for blood pressure recheck with me.  If you develop chest pain, palpitations or SOB, please proceed to the ER.  You will be contacted by Urology for an appointment.  I have made this urgent so they will see you within a week or two.  IT IS VERY IMPORTANT YOU GO TO THIS APPOINTMENT.  Hypertension As your heart beats, it forces blood through your arteries. This force is your blood pressure. If the pressure is too high, it is called hypertension (HTN) or high blood pressure. HTN is dangerous because you may have it and not know it. High blood pressure may mean that your heart has to work harder to pump blood. Your arteries may be narrow or stiff. The extra work puts you at risk for heart disease, stroke, and other problems.  Blood pressure consists of two numbers, a higher number over a lower, 110/72, for example. It is stated as "110 over 72." The ideal is below 120 for the top number (systolic) and under 80 for the bottom (diastolic). Write down your blood pressure today. You should pay close attention to your blood pressure if you have certain conditions such as:  Heart failure.  Prior heart attack.  Diabetes  Chronic kidney disease.  Prior stroke.  Multiple risk factors for heart disease. To see if you have HTN, your blood pressure should be measured while you are seated with your arm held at the level of the heart. It should be measured at least twice. A one-time elevated blood pressure reading (especially in the Emergency Department) does not mean that you need treatment. There may be conditions in which the blood pressure is different between your right and left arms. It is important to see your caregiver soon for a recheck. Most people have essential hypertension which means that there is not a specific cause. This type of high blood pressure may be lowered by changing lifestyle factors such  as:  Stress.  Smoking.  Lack of exercise.  Excessive weight.  Drug/tobacco/alcohol use.  Eating less salt. Most people do not have symptoms from high blood pressure until it has caused damage to the body. Effective treatment can often prevent, delay or reduce that damage. TREATMENT  When a cause has been identified, treatment for high blood pressure is directed at the cause. There are a large number of medications to treat HTN. These fall into several categories, and your caregiver will help you select the medicines that are best for you. Medications may have side effects. You should review side effects with your caregiver. If your blood pressure stays high after you have made lifestyle changes or started on medicines,   Your medication(s) may need to be changed.  Other problems may need to be addressed.  Be certain you understand your prescriptions, and know how and when to take your medicine.  Be sure to follow up with your caregiver within the time frame advised (usually within two weeks) to have your blood pressure rechecked and to review your medications.  If you are taking more than one medicine to lower your blood pressure, make sure you know how and at what times they should be taken. Taking two medicines at the same time can result in blood pressure that is too low. SEEK IMMEDIATE MEDICAL CARE IF:  You develop a severe headache, blurred or changing vision, or confusion.  You have unusual weakness or numbness, or a faint feeling.  You have severe chest or abdominal pain, vomiting, or breathing problems. MAKE SURE YOU:   Understand these instructions.  Will watch your condition.  Will get help right away if you are not doing well or get worse. Document Released: 03/15/2005 Document Revised: 06/07/2011 Document Reviewed: 11/03/2007 [facility] Patient Information 2014 [place].

## 2013-07-04 NOTE — Assessment & Plan Note (Signed)
Patient has not seen his Urologist as directed.  Called Urologist to see about setting up appointment, they state patient has not been seen in several years and is no longer a patient.  Urgent referral placed to [facility] for further evaluation and treatment.

## 2013-07-04 NOTE — Assessment & Plan Note (Signed)
Medication refilled.  Due for repeat Lipid Panel in 3 months.

## 2013-07-04 NOTE — Progress Notes (Signed)
Pre visit review using our clinic review tool, if applicable. No additional management support is needed unless otherwise documented below in the visit note/SLS  

## 2013-07-18 ENCOUNTER — Encounter: Payer: Self-pay | Admitting: Physician Assistant

## 2013-07-18 ENCOUNTER — Ambulatory Visit (INDEPENDENT_AMBULATORY_CARE_PROVIDER_SITE_OTHER): Payer: Medicare Other | Admitting: Physician Assistant

## 2013-07-18 VITALS — BP 180/98 | HR 67 | Temp 98.7°F | Resp 18 | Ht 65.25 in | Wt 258.2 lb

## 2013-07-18 DIAGNOSIS — I10 Essential (primary) hypertension: Secondary | ICD-10-CM

## 2013-07-18 MED ORDER — LISINOPRIL 20 MG PO TABS: 20.0000 mg | ORAL_TABLET | Freq: Every day | ORAL | Status: DC

## 2013-07-18 NOTE — Progress Notes (Signed)
Pre visit review using our clinic review tool, if applicable. No additional management support is needed unless otherwise documented below in the visit note/SLS  

## 2013-07-18 NOTE — Patient Instructions (Signed)
Increase Lisinopril to 20 mg daily -- Finish your current prescription of 10 mg tablets but take 2 tablets daily.  When you pick up your new prescription, it will be for a 20 mg tablet.  Take one of these daily.  Continue the metoprolol twice daily as directed.  Follow-up in 2 weeks.  Call if you develop headaches, vision changes or fatigue. If you develop chest pain or shortness of breath, call 911.  DASH Diet The DASH diet stands for "Dietary Approaches to Stop Hypertension." It is a healthy eating plan that has been shown to reduce high blood pressure (hypertension) in as little as 14 days, while also possibly providing other significant health benefits. These other health benefits include reducing the risk of breast cancer after menopause and reducing the risk of type 2 diabetes, heart disease, colon cancer, and stroke. Health benefits also include weight loss and slowing kidney failure in patients with chronic kidney disease.  DIET GUIDELINES  Limit salt (sodium). Your diet should contain less than 1500 mg of sodium daily.  Limit refined or processed carbohydrates. Your diet should include mostly whole grains. Desserts and added sugars should be used sparingly.  Include small amounts of heart-healthy fats. These types of fats include nuts, oils, and tub margarine. Limit saturated and trans fats. These fats have been shown to be harmful in the body. CHOOSING FOODS  The following food groups are based on a 2000 calorie diet. See your Registered Dietitian for individual calorie needs. Grains and Grain Products (6 to 8 servings daily)  Eat More Often: Whole-wheat bread, brown rice, whole-grain or wheat pasta, quinoa, popcorn without added fat or salt (air popped).  Eat Less Often: White bread, white pasta, white rice, cornbread. Vegetables (4 to 5 servings daily)  Eat More Often: Fresh, frozen, and canned vegetables. Vegetables may be raw, steamed, roasted, or grilled with a minimal amount of  fat.  Eat Less Often/Avoid: Creamed or fried vegetables. Vegetables in a cheese sauce. Fruit (4 to 5 servings daily)  Eat More Often: All fresh, canned (in natural juice), or frozen fruits. Dried fruits without added sugar. One hundred percent fruit juice ( cup [237 mL] daily).  Eat Less Often: Dried fruits with added sugar. Canned fruit in light or heavy syrup. [employer], Fish, and Poultry (2 servings or less daily. One serving is 3 to 4 oz [85-114 g]).  Eat More Often: Ninety percent or leaner ground beef, tenderloin, sirloin. Round cuts of beef, chicken breast, [place] breast. All fish. Grill, bake, or broil your meat. Nothing should be fried.  Eat Less Often/Avoid: Fatty cuts of meat, [place], or chicken leg, thigh, or wing. Fried cuts of meat or fish. Dairy (2 to 3 servings)  Eat More Often: Low-fat or fat-free milk, low-fat plain or light yogurt, reduced-fat or part-skim cheese.  Eat Less Often/Avoid: Milk (whole, 2%).Whole milk yogurt. Full-fat cheeses. Nuts, Seeds, and Legumes (4 to 5 servings per week)  Eat More Often: All without added salt.  Eat Less Often/Avoid: Salted nuts and seeds, canned beans with added salt. Fats and Sweets (limited)  Eat More Often: Vegetable oils, tub margarines without trans fats, sugar-free gelatin. Mayonnaise and salad dressings.  Eat Less Often/Avoid: Coconut oils, palm oils, butter, stick margarine, cream, half and half, cookies, candy, pie. FOR MORE INFORMATION The Dash Diet Eating Plan: www.dashdiet.org Document Released: 03/04/2011 Document Revised: 06/07/2011 Document Reviewed: 03/04/2011 [facility] Patient Information 2014 [place], [place].

## 2013-07-18 NOTE — Progress Notes (Signed)
Patient presents to clinic today for 2-week follow up and blood pressure recheck after his antihypertensive medications were restarted.  BP at last visit was 174/100.  Patient was asymptomatic.  Medications were restarted.  Patient endorses taking medications as directed -- Lisinopril 10 mg daily and Metoprolol 50 mg BID.  Patient still denies chest pain, palpitations, lightheadedness, frequent headaches, shortness of breath.  Patient with elevated PSA -- has upcoming appointment with Urology for evaluation.    Past Medical History  Diagnosis Date  . Hypertension   . Dementia   . Depressed   . Hyperlipemia   . Chicken pox   . Mumps   . Measles   . Schizophrenia simplex     Symptoms w/depression    Current Outpatient Prescriptions on File Prior to Visit  Medication Sig Dispense Refill  . ARIPiprazole (ABILIFY) 10 MG tablet Take 10 mg by mouth daily.      . Calcium-Vitamin D 600-200 MG-UNIT per tablet Take 1 tablet by mouth daily.      . metoprolol (LOPRESSOR) 50 MG tablet TAKE 1 TABLET BY MOUTH TWICE A DAY  60 tablet  2  . ranitidine (ZANTAC) 150 MG capsule TAKE ONE CAPSULE BY MOUTH TWICE A DAY  60 capsule  2  . simvastatin (ZOCOR) 40 MG tablet TAKE 1 TABLET BY MOUTH EVERY EVENING  30 tablet  2   No current facility-administered medications on file prior to visit.    No Known Allergies  Family History  Problem Relation Age of Onset  . Hypertension Father   . Hyperlipidemia Father   . Congestive Heart Failure Father 44    Deceased-1995  . Diabetes Mother   . Kidney failure Mother 44    Deceased-2005  . Hypertension Mother   . Congestive Heart Failure Maternal Grandmother   . Heart failure Paternal Grandmother   . Heart attack Paternal Grandmother     History   Social History  . Marital Status: Single    Spouse Name: N/A    Number of Children: N/A  . Years of Education: N/A   Social History Main Topics  . Smoking status: Never Smoker   . Smokeless tobacco: Never Used   . Alcohol Use: No  . Drug Use: No  . Sexual Activity: No   Other Topics Concern  . None   Social History Narrative  . None   Review of Systems - See HPI.  All other ROS are negative.  BP 180/98  Pulse 67  Temp(Src) 98.7 F (37.1 C) (Oral)  Resp 18  Ht 5' 5.25" (1.657 m)  Wt 258 lb 4 oz (117.141 kg)  BMI 42.66 kg/m2  SpO2 97%  Physical Exam  Vitals reviewed. Constitutional: He is oriented to person, place, and time and well-developed, well-nourished, and in no distress.  HENT:  Head: Normocephalic and atraumatic.  Eyes: Conjunctivae are normal. Pupils are equal, round, and reactive to light.  Neck: Neck supple. No thyromegaly present.  Cardiovascular: Normal rate, regular rhythm, normal heart sounds and intact distal pulses.   No murmur heard. Pulmonary/Chest: Effort normal and breath sounds normal. No respiratory distress. He has no wheezes. He has no rales. He exhibits no tenderness.  Neurological: He is alert and oriented to person, place, and time.  Skin: Skin is warm and dry. No rash noted.  Psychiatric: Affect normal.   Assessment/Plan: Essential hypertension, benign BP remains elevated despite restarting medications.  Asymptomatic.  Increase Lisinopril to 20 mg daily.  Continue current regimen of Metoprolol.  DASH handout given. Follow-up 2 weeks for reassessment and labs.  Some concern of persistent elevation of BP giving patient's elevated PSA.  Referral has already been placed several visits ago, but patient has yet to see the urologist.  States he has an appointment the first week of May. I will call Urologist to verify this.

## 2013-07-18 NOTE — Assessment & Plan Note (Signed)
BP remains elevated despite restarting medications.  Asymptomatic.  Increase Lisinopril to 20 mg daily.  Continue current regimen of Metoprolol.  DASH handout given. Follow-up 2 weeks for reassessment and labs.  Some concern of persistent elevation of BP giving patient's elevated PSA.  Referral has already been placed several visits ago, but patient has yet to see the urologist.  States he has an appointment the first week of May. I will call Urologist to verify this.

## 2013-08-02 ENCOUNTER — Encounter: Payer: Self-pay | Admitting: Physician Assistant

## 2013-08-02 ENCOUNTER — Telehealth: Payer: Self-pay | Admitting: Physician Assistant

## 2013-08-02 ENCOUNTER — Other Ambulatory Visit: Payer: Self-pay | Admitting: Physician Assistant

## 2013-08-02 ENCOUNTER — Ambulatory Visit (INDEPENDENT_AMBULATORY_CARE_PROVIDER_SITE_OTHER): Payer: Medicare Other | Admitting: Physician Assistant

## 2013-08-02 VITALS — BP 172/100 | HR 64 | Temp 98.4°F | Resp 20 | Ht 65.25 in | Wt 260.5 lb

## 2013-08-02 DIAGNOSIS — I10 Essential (primary) hypertension: Secondary | ICD-10-CM

## 2013-08-02 DIAGNOSIS — Z23 Encounter for immunization: Secondary | ICD-10-CM | POA: Insufficient documentation

## 2013-08-02 DIAGNOSIS — F319 Bipolar disorder, unspecified: Secondary | ICD-10-CM

## 2013-08-02 MED ORDER — LISINOPRIL 30 MG PO TABS: 30.0000 mg | ORAL_TABLET | Freq: Every day | ORAL | Status: DC

## 2013-08-02 MED ORDER — ARIPIPRAZOLE 10 MG PO TABS: 10.0000 mg | ORAL_TABLET | Freq: Every day | ORAL | Status: DC

## 2013-08-02 NOTE — Assessment & Plan Note (Addendum)
Increase lisinopril to 30 mg daily. Follow-up 3 weeks for BP check.

## 2013-08-02 NOTE — Progress Notes (Signed)
Pre visit review using our clinic review tool, if applicable. No additional management support is needed unless otherwise documented below in the visit note/SLS  

## 2013-08-02 NOTE — Assessment & Plan Note (Signed)
Referral placed to new psychiatrist.

## 2013-08-02 NOTE — Assessment & Plan Note (Signed)
Prevnar given

## 2013-08-02 NOTE — Patient Instructions (Signed)
Please take medications as directed.  The new prescription of Lisinopril is a 30 mg tablet.  Continue diet and exercise.  Follow-up for a BP recheck in 3 weeks.  Please see Urology as scheduled.  I will place a referral to a new psychiatrist for you.

## 2013-08-02 NOTE — Telephone Encounter (Signed)
Relevant patient education mailed to patient.  

## 2013-08-02 NOTE — Progress Notes (Signed)
Patient presents to clinic today for follow-up of hypertension.  At last visit patient's lisinopril was increased to 20 mg daily.  BP has improved some but is still above goal.  Patient endorses taking medications as directed.  Still without symptoms.  Patient has upcoming appointment with Urology due to elevated PSA.  Patient also requesting referral to new psychiatrist.    Past Medical History  Diagnosis Date  . Hypertension   . Dementia   . Depressed   . Hyperlipemia   . Chicken pox   . Mumps   . Measles   . Schizophrenia simplex     Symptoms w/depression    Current Outpatient Prescriptions on File Prior to Visit  Medication Sig Dispense Refill  . Calcium-Vitamin D 600-200 MG-UNIT per tablet Take 1 tablet by mouth daily.      . metoprolol (LOPRESSOR) 50 MG tablet TAKE 1 TABLET BY MOUTH TWICE A DAY  60 tablet  2  . simvastatin (ZOCOR) 40 MG tablet TAKE 1 TABLET BY MOUTH EVERY EVENING  30 tablet  2  . ranitidine (ZANTAC) 150 MG capsule TAKE ONE CAPSULE BY MOUTH TWICE A DAY  60 capsule  2   No current facility-administered medications on file prior to visit.    No Known Allergies  Family History  Problem Relation Age of Onset  . Hypertension Father   . Hyperlipidemia Father   . Congestive Heart Failure Father 68    Deceased-1995  . Diabetes Mother   . Kidney failure Mother 16    Deceased-2005  . Hypertension Mother   . Congestive Heart Failure Maternal Grandmother   . Heart failure Paternal Grandmother   . Heart attack Paternal Grandmother     History   Social History  . Marital Status: Single    Spouse Name: N/A    Number of Children: N/A  . Years of Education: N/A   Social History Main Topics  . Smoking status: Never Smoker   . Smokeless tobacco: Never Used  . Alcohol Use: No  . Drug Use: No  . Sexual Activity: No   Other Topics Concern  . None   Social History Narrative  . None   Review of Systems - See HPI.  All other ROS are negative.  BP 172/100   Pulse 64  Temp(Src) 98.4 F (36.9 C) (Oral)  Resp 20  Ht 5' 5.25" (1.657 m)  Wt 260 lb 8 oz (118.162 kg)  BMI 43.04 kg/m2  SpO2 97%  Physical Exam  Vitals reviewed. Constitutional: He is oriented to person, place, and time and well-developed, well-nourished, and in no distress.  HENT:  Head: Normocephalic and atraumatic.  Eyes: Conjunctivae and EOM are normal. Pupils are equal, round, and reactive to light.  Neck: Neck supple.  Cardiovascular: Normal rate, regular rhythm, normal heart sounds and intact distal pulses.   No murmur heard. Pulmonary/Chest: Effort normal and breath sounds normal. No respiratory distress. He has no wheezes. He has no rales. He exhibits no tenderness.  Neurological: He is alert and oriented to person, place, and time. No cranial nerve deficit.  Skin: Skin is warm and dry. No rash noted.  Psychiatric: Affect normal.    No results found for this or any previous visit (from the past 2160 hour(s)).  Assessment/Plan: Essential hypertension, benign Increase lisinopril to 30 mg daily. Follow-up 3 weeks for BP check.  Bipolar 1 disorder Referral placed to new psychiatrist.  Need for prophylactic vaccination against Streptococcus pneumoniae (pneumococcus) Prevnar given.

## 2013-08-03 ENCOUNTER — Telehealth: Payer: Self-pay

## 2013-08-03 NOTE — Telephone Encounter (Signed)
Rx request Denied; all Rx[s] were last received by pharmacy 04.08.15 with (2) refills-Too Soon for request/SLS

## 2013-08-06 NOTE — Telephone Encounter (Signed)
Could not leave message, mailbox full. Will try again later °

## 2013-08-07 NOTE — Telephone Encounter (Signed)
Opened in error

## 2013-08-08 NOTE — Telephone Encounter (Signed)
Could not leave message, mailbox full. Will try again later

## 2013-08-13 NOTE — Telephone Encounter (Signed)
Could not leave message, mailbox full

## 2013-08-23 ENCOUNTER — Ambulatory Visit (INDEPENDENT_AMBULATORY_CARE_PROVIDER_SITE_OTHER): Payer: Medicare Other | Admitting: Physician Assistant

## 2013-08-23 ENCOUNTER — Encounter: Payer: Self-pay | Admitting: Physician Assistant

## 2013-08-23 VITALS — BP 168/96 | HR 68 | Temp 98.5°F | Resp 20 | Ht 65.25 in | Wt 254.8 lb

## 2013-08-23 DIAGNOSIS — Z23 Encounter for immunization: Secondary | ICD-10-CM

## 2013-08-23 DIAGNOSIS — I10 Essential (primary) hypertension: Secondary | ICD-10-CM

## 2013-08-23 MED ORDER — HYDROCHLOROTHIAZIDE 12.5 MG PO TABS: 12.5000 mg | ORAL_TABLET | Freq: Every day | ORAL | Status: DC

## 2013-08-23 NOTE — Progress Notes (Signed)
Patient presents to clinic today for follow-up of hypertension since increasing lisinopril to 30 mg. BP has improved but only slightly.  Bp still in 160s/90s.  Patient endorses taking medications as directed. Patient remains asymptomatic. Denies new concerns.   Past Medical History  Diagnosis Date  . Hypertension   . Dementia   . Depressed   . Hyperlipemia   . Chicken pox   . Mumps   . Measles   . Schizophrenia simplex     Symptoms w/depression    Current Outpatient Prescriptions on File Prior to Visit  Medication Sig Dispense Refill  . ARIPiprazole (ABILIFY) 10 MG tablet Take 1 tablet (10 mg total) by mouth daily.  30 tablet  0  . Calcium-Vitamin D 600-200 MG-UNIT per tablet Take 1 tablet by mouth daily.      Marland Kitchen lisinopril (PRINIVIL,ZESTRIL) 30 MG tablet Take 1 tablet (30 mg total) by mouth daily.  30 tablet  2  . metoprolol (LOPRESSOR) 50 MG tablet TAKE 1 TABLET BY MOUTH TWICE A DAY  60 tablet  2  . ranitidine (ZANTAC) 150 MG capsule TAKE ONE CAPSULE BY MOUTH TWICE A DAY  60 capsule  2  . simvastatin (ZOCOR) 40 MG tablet TAKE 1 TABLET BY MOUTH EVERY EVENING  30 tablet  2   No current facility-administered medications on file prior to visit.    No Known Allergies  Family History  Problem Relation Age of Onset  . Hypertension Father   . Hyperlipidemia Father   . Congestive Heart Failure Father 103    Deceased-1995  . Diabetes Mother   . Kidney failure Mother 45    Deceased-2005  . Hypertension Mother   . Congestive Heart Failure Maternal Grandmother   . Heart failure Paternal Grandmother   . Heart attack Paternal Grandmother     History   Social History  . Marital Status: Single    Spouse Name: N/A    Number of Children: N/A  . Years of Education: N/A   Social History Main Topics  . Smoking status: Never Smoker   . Smokeless tobacco: Never Used  . Alcohol Use: No  . Drug Use: No  . Sexual Activity: No   Other Topics Concern  . None   Social History  Narrative  . None   Review of Systems - See HPI.  All other ROS are negative.  BP 168/96  Pulse 68  Temp(Src) 98.5 F (36.9 C) (Oral)  Resp 20  Ht 5' 5.25" (1.657 m)  Wt 254 lb 12 oz (115.554 kg)  BMI 42.09 kg/m2  SpO2 98%  Physical Exam  Vitals reviewed. Constitutional: He is oriented to person, place, and time and well-developed, well-nourished, and in no distress.  HENT:  Head: Normocephalic and atraumatic.  Eyes: Conjunctivae are normal. Pupils are equal, round, and reactive to light.  Neck: Neck supple. No thyromegaly present.  Cardiovascular: Normal rate, regular rhythm, normal heart sounds and intact distal pulses.   Pulmonary/Chest: Effort normal and breath sounds normal. No respiratory distress. He has no wheezes. He has no rales. He exhibits no tenderness.  Neurological: He is alert and oriented to person, place, and time.  Skin: Skin is warm and dry. No rash noted.  Psychiatric: Affect normal.    No results found for this or any previous visit (from the past 2160 hour(s)).  Assessment/Plan: Essential hypertension, benign Will add 12.5 mg HCTZ daily.  Follow-up in 3 weeks. If BP remains elevated will proceed with renal US to assess  for RAS.

## 2013-08-23 NOTE — Assessment & Plan Note (Signed)
Will add 12.5 mg HCTZ daily.  Follow-up in 3 weeks. If BP remains elevated will proceed with renal US to assess for RAS.

## 2013-08-23 NOTE — Patient Instructions (Signed)
Please start taking the Hydrochlorothiazide in addition to your other blood pressure medications.  Follow-up in 3-4 weeks.  If BP remains elevated, we will need to get an ultrasound of your renal arteries.

## 2013-08-23 NOTE — Progress Notes (Signed)
Pre visit review using our clinic review tool, if applicable. No additional management support is needed unless otherwise documented below in the visit note/SLS  

## 2013-08-30 ENCOUNTER — Other Ambulatory Visit: Payer: Self-pay | Admitting: Physician Assistant

## 2013-08-30 NOTE — Telephone Encounter (Signed)
Rx request to pharmacy/[facility]  

## 2013-09-05 ENCOUNTER — Other Ambulatory Visit: Payer: Self-pay | Admitting: Physician Assistant

## 2013-09-05 NOTE — Telephone Encounter (Signed)
Medication Detail      Disp Refills Start End     lisinopril (PRINIVIL,ZESTRIL) 30 MG tablet 30 tablet 2 08/02/2013     Sig - Route: Take 1 tablet (30 mg total) by mouth daily. - Oral    E-Prescribing Status: Receipt confirmed by pharmacy (08/02/2013 11:47 AM EDT)    Associated Diagnoses    Essential hypertension, benign - Primary    Pharmacy    [id number] - [place], [place] - [address] AT [address]    Rx request Denied--too soon/SLS

## 2013-09-13 ENCOUNTER — Other Ambulatory Visit (HOSPITAL_COMMUNITY): Payer: Self-pay | Admitting: Physician Assistant

## 2013-09-13 ENCOUNTER — Encounter: Payer: Self-pay | Admitting: Physician Assistant

## 2013-09-13 ENCOUNTER — Ambulatory Visit (INDEPENDENT_AMBULATORY_CARE_PROVIDER_SITE_OTHER): Payer: Medicare Other | Admitting: Physician Assistant

## 2013-09-13 VITALS — BP 172/74 | HR 61 | Temp 98.5°F | Resp 16 | Ht 65.25 in | Wt 253.2 lb

## 2013-09-13 DIAGNOSIS — I10 Essential (primary) hypertension: Secondary | ICD-10-CM

## 2013-09-13 DIAGNOSIS — I701 Atherosclerosis of renal artery: Secondary | ICD-10-CM

## 2013-09-13 DIAGNOSIS — R7309 Other abnormal glucose: Secondary | ICD-10-CM

## 2013-09-13 LAB — HEMOGLOBIN A1C
HEMOGLOBIN A1C: 5.5 % (ref <5.7)
Mean Plasma Glucose: 111 mg/dL (ref <117)

## 2013-09-13 NOTE — Progress Notes (Signed)
Pre visit review using our clinic review tool, if applicable. No additional management support is needed unless otherwise documented below in the visit note/SLS  

## 2013-09-13 NOTE — Patient Instructions (Signed)
Please obtain labs.  You will be contacted for ultrasound. I will call you with all your results.  Please continue all medications as directed.  I am increasing your HCTZ to 25 mg.  We will schedule follow-up based on results.  Hypertension Hypertension, commonly called high blood pressure, is when the force of blood pumping through your arteries is too strong. Your arteries are the blood vessels that carry blood from your heart throughout your body. A blood pressure reading consists of a higher number over a lower number, such as 110/72. The higher number (systolic) is the pressure inside your arteries when your heart pumps. The lower number (diastolic) is the pressure inside your arteries when your heart relaxes. Ideally you want your blood pressure below 120/80. Hypertension forces your heart to work harder to pump blood. Your arteries may become narrow or stiff. Having hypertension puts you at risk for heart disease, stroke, and other problems.  RISK FACTORS Some risk factors for high blood pressure are controllable. Others are not.  Risk factors you cannot control include:   Race. You may be at higher risk if you are African American.  Age. Risk increases with age.  Gender. Men are at higher risk than women before age 43 years. After age 75, women are at higher risk than men. Risk factors you can control include:  Not getting enough exercise or physical activity.  Being overweight.  Getting too much fat, sugar, calories, or salt in your diet.  Drinking too much alcohol. SIGNS AND SYMPTOMS Hypertension does not usually cause signs or symptoms. Extremely high blood pressure (hypertensive crisis) may cause headache, anxiety, shortness of breath, and nosebleed. DIAGNOSIS  To check if you have hypertension, your health care provider will measure your blood pressure while you are seated, with your arm held at the level of your heart. It should be measured at least twice using the same arm.  Certain conditions can cause a difference in blood pressure between your right and left arms. A blood pressure reading that is higher than normal on one occasion does not mean that you need treatment. If one blood pressure reading is high, ask your health care provider about having it checked again. TREATMENT  Treating high blood pressure includes making lifestyle changes and possibly taking medication. Living a healthy lifestyle can help lower high blood pressure. You may need to change some of your habits. Lifestyle changes may include:  Following the DASH diet. This diet is high in fruits, vegetables, and whole grains. It is low in salt, red meat, and added sugars.  Getting at least 2 1/2 hours of brisk physical activity every week.  Losing weight if necessary.  Not smoking.  Limiting alcoholic beverages.  Learning ways to reduce stress. If lifestyle changes are not enough to get your blood pressure under control, your health care provider may prescribe medicine. You may need to take more than one. Work closely with your health care provider to understand the risks and benefits. HOME CARE INSTRUCTIONS  Have your blood pressure rechecked as directed by your health care provider.   Only take medicine as directed by your health care provider. Follow the directions carefully. Blood pressure medicines must be taken as prescribed. The medicine does not work as well when you skip doses. Skipping doses also puts you at risk for problems.   Do not smoke.   Monitor your blood pressure at home as directed by your health care provider. SEEK MEDICAL CARE IF:   You  think you are having a reaction to medicines taken.  You have recurrent headaches or feel dizzy.  You have swelling in your ankles.  You have trouble with your vision. SEEK IMMEDIATE MEDICAL CARE IF:  You develop a severe headache or confusion.  You have unusual weakness, numbness, or feel faint.  You have severe chest  or abdominal pain.  You vomit repeatedly.  You have trouble breathing. MAKE SURE YOU:   Understand these instructions.  Will watch your condition.  Will get help right away if you are not doing well or get worse. Document Released: 03/15/2005 Document Revised: 03/20/2013 Document Reviewed: 01/05/2013 [facility] Patient Information 2015 [place], [place]. This information is not intended to replace advice given to you by your health care provider. Make sure you discuss any questions you have with your health care provider.

## 2013-09-13 NOTE — Progress Notes (Signed)
Patient presents to clinic today for follow-up of Hypertension.  Patient's hypertension previously controlled with one hypertensive agent, but now requiring three.  At last visit patient was placed on HCTZ with some improvement noted in BP.  Again patient denies chest pain, palpitations, lightheadedness, SOB or vision changes.  Endorses taking medications as prescribed.  Past Medical History  Diagnosis Date  . Hypertension   . Dementia   . Depressed   . Hyperlipemia   . Chicken pox   . Mumps   . Measles   . Schizophrenia simplex     Symptoms w/depression    Current Outpatient Prescriptions on File Prior to Visit  Medication Sig Dispense Refill  . ABILIFY 10 MG tablet TAKE 1 TABLET BY MOUTH EVERY DAY  30 tablet  5  . Calcium-Vitamin D 600-200 MG-UNIT per tablet Take 1 tablet by mouth daily.      . finasteride (PROSCAR) 5 MG tablet Take 5 mg by mouth daily.      . hydrochlorothiazide (HYDRODIURIL) 12.5 MG tablet Take 1 tablet (12.5 mg total) by mouth daily.  30 tablet  3  . lisinopril (PRINIVIL,ZESTRIL) 30 MG tablet Take 1 tablet (30 mg total) by mouth daily.  30 tablet  2  . metoprolol (LOPRESSOR) 50 MG tablet TAKE 1 TABLET BY MOUTH TWICE A DAY  60 tablet  2  . ranitidine (ZANTAC) 150 MG capsule TAKE ONE CAPSULE BY MOUTH TWICE A DAY  60 capsule  2  . simvastatin (ZOCOR) 40 MG tablet TAKE 1 TABLET BY MOUTH EVERY EVENING  30 tablet  2   No current facility-administered medications on file prior to visit.    No Known Allergies  Family History  Problem Relation Age of Onset  . Hypertension Father   . Hyperlipidemia Father   . Congestive Heart Failure Father 50    Deceased-1995  . Diabetes Mother   . Kidney failure Mother 4    Deceased-2005  . Hypertension Mother   . Congestive Heart Failure Maternal Grandmother   . Heart failure Paternal Grandmother   . Heart attack Paternal Grandmother     History   Social History  . Marital Status: Single    Spouse Name: N/A    Number  of Children: N/A  . Years of Education: N/A   Social History Main Topics  . Smoking status: Never Smoker   . Smokeless tobacco: Never Used  . Alcohol Use: No  . Drug Use: No  . Sexual Activity: No   Other Topics Concern  . None   Social History Narrative  . None   Review of Systems - See HPI.  All other ROS are negative.  BP 172/74  Pulse 61  Temp(Src) 98.5 F (36.9 C) (Oral)  Resp 16  Ht 5' 5.25" (1.657 m)  Wt 253 lb 4 oz (114.873 kg)  BMI 41.84 kg/m2  SpO2 98%  Physical Exam  Constitutional: He is oriented to person, place, and time and well-developed, well-nourished, and in no distress.  HENT:  Head: Normocephalic and atraumatic.  Right Ear: External ear normal.  Left Ear: External ear normal.  Nose: Nose normal.  Mouth/Throat: Oropharynx is clear and moist. No oropharyngeal exudate.  Eyes: Conjunctivae are normal. Pupils are equal, round, and reactive to light.  Neck: Neck supple.  Cardiovascular: Normal rate, regular rhythm, normal heart sounds and intact distal pulses.   Pulmonary/Chest: Effort normal and breath sounds normal. No respiratory distress. He has no wheezes. He has no rales. He exhibits no  tenderness.  Abdominal: He exhibits no abdominal bruit.  Neurological: He is alert and oriented to person, place, and time.  Skin: Skin is warm and dry. No rash noted.  Psychiatric: Affect normal.   Assessment/Plan: Essential hypertension, benign 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  Will increase dose to 25 mg daily.  Will obtain US to r/o RAS.  Elevated hemoglobin A1c Due for repeat A1C.  Will obtain at today's visit.

## 2013-09-14 ENCOUNTER — Ambulatory Visit (HOSPITAL_COMMUNITY): Payer: Medicare Other

## 2013-09-16 DIAGNOSIS — R7309 Other abnormal glucose: Secondary | ICD-10-CM | POA: Insufficient documentation

## 2013-09-16 NOTE — Assessment & Plan Note (Signed)
Due for repeat A1C.  Will obtain at today's visit.

## 2013-09-16 NOTE — Assessment & Plan Note (Signed)
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  Will increase dose to 25 mg daily.  Will obtain US to r/o RAS.

## 2013-09-17 ENCOUNTER — Ambulatory Visit (HOSPITAL_COMMUNITY)
Admission: RE | Admit: 2013-09-17 | Discharge: 2013-09-17 | Disposition: A | Discharge status: Home / Self Care | Payer: Medicare Other | Source: Ambulatory Visit | Attending: Physician Assistant | Admitting: Physician Assistant

## 2013-09-17 DIAGNOSIS — I701 Atherosclerosis of renal artery: Secondary | ICD-10-CM

## 2013-09-17 DIAGNOSIS — I10 Essential (primary) hypertension: Secondary | ICD-10-CM

## 2013-09-17 NOTE — Progress Notes (Signed)
*  PRELIMINARY RESULTS* Vascular Ultrasound Renal Artery Duplex has been completed.   There is no obvious evidence of hemodynamically significant renal artery stenosis.  09/17/2013 10:39 AM Maudry Mayhew, RVT, RDCS, RDMS

## 2013-09-18 ENCOUNTER — Telehealth: Payer: Self-pay | Admitting: Physician Assistant

## 2013-09-18 NOTE — Telephone Encounter (Signed)
Patient needs [place] to notify his pharmacy that we increase his Hydrochlorothiazide 25mg  so they can fill it correctly

## 2013-09-19 ENCOUNTER — Telehealth: Payer: Self-pay | Admitting: Physician Assistant

## 2013-09-19 DIAGNOSIS — I10 Essential (primary) hypertension: Secondary | ICD-10-CM

## 2013-09-19 MED ORDER — HYDROCHLOROTHIAZIDE 25 MG PO TABS: 25.0000 mg | ORAL_TABLET | Freq: Every day | ORAL | Status: DC

## 2013-09-19 NOTE — Telephone Encounter (Signed)
Called patient regarding results.  A1C looks good. [place] without evidence of renal artery stenosis.  His new Rx for HCTZ 25 mg daily has been sent to his pharmacy.  Patient did not answer.  LMOM.

## 2013-09-19 NOTE — Telephone Encounter (Signed)
Taken care of per provider/SLS

## 2013-10-03 ENCOUNTER — Telehealth: Payer: Self-pay | Admitting: Physician Assistant

## 2013-10-03 ENCOUNTER — Encounter: Payer: Self-pay | Admitting: *Deleted

## 2013-10-03 DIAGNOSIS — I10 Essential (primary) hypertension: Secondary | ICD-10-CM

## 2013-10-03 DIAGNOSIS — E785 Hyperlipidemia, unspecified: Secondary | ICD-10-CM

## 2013-10-03 DIAGNOSIS — K219 Gastro-esophageal reflux disease without esophagitis: Secondary | ICD-10-CM

## 2013-10-03 MED ORDER — SIMVASTATIN 40 MG PO TABS: ORAL_TABLET | ORAL | Status: DC

## 2013-10-03 MED ORDER — METOPROLOL TARTRATE 50 MG PO TABS: ORAL_TABLET | ORAL | Status: DC

## 2013-10-03 MED ORDER — LISINOPRIL 30 MG PO TABS: 30.0000 mg | ORAL_TABLET | Freq: Every day | ORAL | Status: DC

## 2013-10-03 MED ORDER — RANITIDINE HCL 150 MG PO CAPS: ORAL_CAPSULE | ORAL | Status: DC

## 2013-10-03 NOTE — Telephone Encounter (Signed)
Rx request that were due to pharmacy/SLS

## 2013-10-03 NOTE — Telephone Encounter (Signed)
Refill- lisinopril  Refill- simvastatin  Refill- metoprolol  Refill- hydrochlorothiazide  Refill- ranitidine  Refill- aripiprazole  [facility] [address]

## 2013-10-03 NOTE — Telephone Encounter (Signed)
Patient wants to know when he needs to come back in for a follow up?

## 2013-10-04 ENCOUNTER — Other Ambulatory Visit: Payer: Self-pay | Admitting: *Deleted

## 2013-10-04 MED ORDER — ARIPIPRAZOLE 10 MG PO TABS: ORAL_TABLET | ORAL | Status: DC

## 2013-10-04 NOTE — Progress Notes (Signed)
Per fax request from pharmacy for 90-day request/SLS

## 2013-10-04 NOTE — Telephone Encounter (Signed)
Patient scheduled for 1-mth F/U for Fri, 07.17.15 at 8:15am; discussed lab & [place] results with pt as well/SLS

## 2013-10-05 ENCOUNTER — Other Ambulatory Visit: Payer: Self-pay | Admitting: Physician Assistant

## 2013-10-05 NOTE — Telephone Encounter (Signed)
Per [facility], they did not receive Rx[s] sent over on 07.08.15; resent escript/SLS

## 2013-10-12 ENCOUNTER — Ambulatory Visit (INDEPENDENT_AMBULATORY_CARE_PROVIDER_SITE_OTHER): Payer: Medicare Other | Admitting: Physician Assistant

## 2013-10-12 ENCOUNTER — Encounter: Payer: Self-pay | Admitting: Physician Assistant

## 2013-10-12 VITALS — BP 142/88 | HR 62 | Temp 98.8°F | Resp 18 | Ht 65.25 in | Wt 250.5 lb

## 2013-10-12 DIAGNOSIS — E785 Hyperlipidemia, unspecified: Secondary | ICD-10-CM

## 2013-10-12 DIAGNOSIS — I10 Essential (primary) hypertension: Secondary | ICD-10-CM

## 2013-10-12 NOTE — Patient Instructions (Signed)
Please obtain labs.  I will call you with your results.  Please continue medications as directed.  Stay active in your garden and getting exercise.  Read information below on the DASH.  Follow-up in 3 months.  I am glad you are doing well.  DASH Eating Plan DASH stands for "Dietary Approaches to Stop Hypertension." The DASH eating plan is a healthy eating plan that has been shown to reduce high blood pressure (hypertension). Additional health benefits may include reducing the risk of type 2 diabetes mellitus, heart disease, and stroke. The DASH eating plan may also help with weight loss. WHAT DO I NEED TO KNOW ABOUT THE DASH EATING PLAN? For the DASH eating plan, you will follow these general guidelines:  Choose foods with a percent daily value for sodium of less than 5% (as listed on the food label).  Use salt-free seasonings or herbs instead of table salt or sea salt.  Check with your health care provider or pharmacist before using salt substitutes.  Eat lower-sodium products, often labeled as "lower sodium" or "no salt added."  Eat fresh foods.  Eat more vegetables, fruits, and low-fat dairy products.  Choose whole grains. Look for the word "whole" as the first word in the ingredient list.  Choose fish and skinless chicken or [place] more often than red meat. Limit fish, poultry, and meat to 6 oz (170 g) each day.  Limit sweets, desserts, sugars, and sugary drinks.  Choose heart-healthy fats.  Limit cheese to 1 oz (28 g) per day.  Eat more home-cooked food and less restaurant, buffet, and fast food.  Limit fried foods.  Cook foods using methods other than frying.  Limit canned vegetables. If you do use them, rinse them well to decrease the sodium.  When eating at a restaurant, ask that your food be prepared with less salt, or no salt if possible. WHAT FOODS CAN I EAT? Seek help from a dietitian for individual calorie needs. Grains Whole grain or whole wheat bread. Brown  rice. Whole grain or whole wheat pasta. Quinoa, bulgur, and whole grain cereals. Low-sodium cereals. Corn or whole wheat flour tortillas. Whole grain cornbread. Whole grain crackers. Low-sodium crackers. Vegetables Fresh or frozen vegetables (raw, steamed, roasted, or grilled). Low-sodium or reduced-sodium tomato and vegetable juices. Low-sodium or reduced-sodium tomato sauce and paste. Low-sodium or reduced-sodium canned vegetables.  Fruits All fresh, canned (in natural juice), or frozen fruits. Meat and Other Protein Products Ground beef (85% or leaner), grass-fed beef, or beef trimmed of fat. Skinless chicken or [place]. Ground chicken or [place]. Pork trimmed of fat. All fish and seafood. Eggs. Dried beans, peas, or lentils. Unsalted nuts and seeds. Unsalted canned beans. Dairy Low-fat dairy products, such as skim or 1% milk, 2% or reduced-fat cheeses, low-fat ricotta or cottage cheese, or plain low-fat yogurt. Low-sodium or reduced-sodium cheeses. Fats and Oils Tub margarines without trans fats. Light or reduced-fat mayonnaise and salad dressings (reduced sodium). Avocado. Safflower, olive, or canola oils. Natural peanut or almond butter. Other Unsalted popcorn and pretzels. The items listed above may not be a complete list of recommended foods or beverages. Contact your dietitian for more options. WHAT FOODS ARE NOT RECOMMENDED? Grains White bread. White pasta. White rice. Refined cornbread. Bagels and croissants. Crackers that contain trans fat. Vegetables Creamed or fried vegetables. Vegetables in a cheese sauce. Regular canned vegetables. Regular canned tomato sauce and paste. Regular tomato and vegetable juices. Fruits Dried fruits. Canned fruit in light or heavy syrup. Fruit juice. Meat  and Other Protein Products Fatty cuts of meat. Ribs, chicken wings, bacon, sausage, bologna, salami, chitterlings, fatback, hot dogs, bratwurst, and packaged luncheon meats. Salted nuts and seeds.  Canned beans with salt. Dairy Whole or 2% milk, cream, half-and-half, and cream cheese. Whole-fat or sweetened yogurt. Full-fat cheeses or blue cheese. Nondairy creamers and whipped toppings. Processed cheese, cheese spreads, or cheese curds. Condiments Onion and garlic salt, seasoned salt, table salt, and sea salt. Canned and packaged gravies. Worcestershire sauce. Tartar sauce. Barbecue sauce. Teriyaki sauce. Soy sauce, including reduced sodium. Steak sauce. Fish sauce. Oyster sauce. Cocktail sauce. Horseradish. Ketchup and mustard. Meat flavorings and tenderizers. Bouillon cubes. Hot sauce. Tabasco sauce. Marinades. Taco seasonings. Relishes. Fats and Oils Butter, stick margarine, lard, shortening, ghee, and bacon fat. Coconut, palm kernel, or palm oils. Regular salad dressings. Other Pickles and olives. Salted popcorn and pretzels. The items listed above may not be a complete list of foods and beverages to avoid. Contact your dietitian for more information. WHERE CAN I FIND MORE INFORMATION? National Heart, Lung, and Blood Institute: travelstabloid.com Document Released: 03/04/2011 Document Revised: 03/20/2013 Document Reviewed: 01/17/2013 [facility] Patient Information 2015 [place], [place]. This information is not intended to replace advice given to you by your health care provider. Make sure you discuss any questions you have with your health care provider.

## 2013-10-12 NOTE — Assessment & Plan Note (Signed)
Will obtain fasting lipid panel 

## 2013-10-12 NOTE — Progress Notes (Signed)
Pre visit review using our clinic review tool, if applicable. No additional management support is needed unless otherwise documented below in the visit note/SLS  

## 2013-10-12 NOTE — Assessment & Plan Note (Signed)
Doing better. BP stable.  Continue current regimen. Continue exercise and DASH diet.  Will check BMP today.  Follow-up 3 months.

## 2013-10-12 NOTE — Progress Notes (Signed)
Patient presents to clinic today for follow-up of hypertension.  At last visit, patient has shown some response to the addition of HCTZ12.5 mg to his regimen.  HCTZ was increased to 25 mg.  Renal artery [place] was also obtained to r/o cause of resistant HTN.  US unremarkable. Patient still asymptomatic.  Endorses taking all medications as directed.  BP is 142/88 in clinic today.  Past Medical History  Diagnosis Date  . Hypertension   . Dementia   . Depressed   . Hyperlipemia   . Chicken pox   . Mumps   . Measles   . Schizophrenia simplex     Symptoms w/depression    Current Outpatient Prescriptions on File Prior to Visit  Medication Sig Dispense Refill  . ARIPiprazole (ABILIFY) 10 MG tablet TAKE 1 TABLET BY MOUTH EVERY DAY  90 tablet  1  . Calcium-Vitamin D 600-200 MG-UNIT per tablet Take 1 tablet by mouth daily.      . finasteride (PROSCAR) 5 MG tablet Take 5 mg by mouth daily.      . hydrochlorothiazide (HYDRODIURIL) 25 MG tablet Take 1 tablet (25 mg total) by mouth daily.  30 tablet  3  . lisinopril (PRINIVIL,ZESTRIL) 30 MG tablet Take 1 tablet (30 mg total) by mouth daily.  30 tablet  2  . metoprolol (LOPRESSOR) 50 MG tablet TAKE 1 TABLET BY MOUTH TWICE A DAY  60 tablet  2  . ranitidine (ZANTAC) 150 MG capsule TAKE ONE CAPSULE BY MOUTH TWICE A DAY  60 capsule  2  . simvastatin (ZOCOR) 40 MG tablet TAKE 1 TABLET BY MOUTH EVERY EVENING  30 tablet  2   No current facility-administered medications on file prior to visit.    No Known Allergies  Family History  Problem Relation Age of Onset  . Hypertension Father   . Hyperlipidemia Father   . Congestive Heart Failure Father 64    Deceased-1995  . Diabetes Mother   . Kidney failure Mother 34    Deceased-2005  . Hypertension Mother   . Congestive Heart Failure Maternal Grandmother   . Heart failure Paternal Grandmother   . Heart attack Paternal Grandmother     History   Social History  . Marital Status: Single    Spouse  Name: N/A    Number of Children: N/A  . Years of Education: N/A   Social History Main Topics  . Smoking status: Never Smoker   . Smokeless tobacco: Never Used  . Alcohol Use: No  . Drug Use: No  . Sexual Activity: No   Other Topics Concern  . Not on file   Social History Narrative  . No narrative on file    Review of Systems - See HPI.  All other ROS are negative.  BP 142/88  Pulse 62  Temp(Src) 98.8 F (37.1 C) (Oral)  Resp 18  Ht 5' 5.25" (1.657 m)  Wt 250 lb 8 oz (113.626 kg)  BMI 41.38 kg/m2  SpO2 96%  Physical Exam  Vitals reviewed. Constitutional: He is oriented to person, place, and time.  HENT:  Head: Normocephalic and atraumatic.  Eyes: Conjunctivae are normal. Pupils are equal, round, and reactive to light.  Neck: Neck supple.  Cardiovascular: Normal rate, regular rhythm, normal heart sounds and intact distal pulses.   Pulmonary/Chest: Effort normal and breath sounds normal. No respiratory distress. He has no wheezes. He has no rales. He exhibits no tenderness.  Neurological: He is alert and oriented to person, place, and  time.  Skin: Skin is warm and dry. No rash noted.  Psychiatric: Affect normal.    Recent Results (from the past 2160 hour(s))  HEMOGLOBIN A1C     Status: None   Collection Time    09/13/13 10:49 AM      Result Value Ref Range   Hemoglobin A1C 5.5  <5.7 %   Comment:                                                                            According to the ADA Clinical Practice Recommendations for 2011, when     HbA1c is used as a screening test:             >=6.5%   Diagnostic of Diabetes Mellitus                (if abnormal result is confirmed)           5.7-6.4%   Increased risk of developing Diabetes Mellitus           References:Diagnosis and Classification of Diabetes Mellitus,Diabetes     YWVP,7106,26(RSWNI 1):S62-S69 and Standards of Medical Care in             Diabetes - 2011,Diabetes Care,2011,34 (Suppl 1):S11-S61.          Mean Plasma Glucose 111  <117 mg/dL    Assessment/Plan: Essential hypertension, benign Doing better. BP stable.  Continue current regimen. Continue exercise and DASH diet.  Will check BMP today.  Follow-up 3 months.  Hyperlipidemia Will obtain fasting lipid panel.

## 2013-10-15 ENCOUNTER — Other Ambulatory Visit: Payer: Self-pay | Admitting: Physician Assistant

## 2013-10-15 LAB — BASIC METABOLIC PANEL
BUN: 11 mg/dL (ref 6–23)
CHLORIDE: 100 meq/L (ref 96–112)
CO2: 32 mEq/L (ref 19–32)
Calcium: 9.3 mg/dL (ref 8.4–10.5)
Creat: 0.99 mg/dL (ref 0.50–1.35)
Glucose, Bld: 109 mg/dL — ABNORMAL HIGH (ref 70–99)
Potassium: 4.5 mEq/L (ref 3.5–5.3)
Sodium: 139 mEq/L (ref 135–145)

## 2013-10-15 LAB — LIPID PANEL
Cholesterol: 132 mg/dL (ref 0–200)
HDL: 45 mg/dL (ref >39)
LDL CALC: 69 mg/dL (ref 0–99)
Total CHOL/HDL Ratio: 2.9 Ratio
Triglycerides: 91 mg/dL (ref <150)
VLDL: 18 mg/dL (ref 0–40)

## 2013-10-16 ENCOUNTER — Telehealth: Payer: Self-pay

## 2013-10-16 NOTE — Telephone Encounter (Signed)
Pt was contacted and Lab results were given/Also Pt was advised to cont. Current med regimen as well as RTC  Within 3 months.Pt voiced understanding of this

## 2013-12-31 ENCOUNTER — Encounter: Payer: Self-pay | Admitting: Podiatry

## 2013-12-31 ENCOUNTER — Ambulatory Visit (INDEPENDENT_AMBULATORY_CARE_PROVIDER_SITE_OTHER): Payer: Medicare Other | Admitting: Podiatry

## 2013-12-31 VITALS — BP 199/97 | HR 69 | Resp 14 | Ht 67.0 in | Wt 238.0 lb

## 2013-12-31 DIAGNOSIS — M79676 Pain in unspecified toe(s): Secondary | ICD-10-CM

## 2013-12-31 DIAGNOSIS — B351 Tinea unguium: Secondary | ICD-10-CM

## 2013-12-31 NOTE — Progress Notes (Signed)
   Subjective:    Patient ID: Ronald [name], male    DOB: Jan 21, 1946, 68 y.o.   MRN: [id number]  HPI Comments: N debridement L 10 toenails D and O long-term C enlongated, thick and tender sometimes A difficult to cut T none     Review of Systems  All other systems reviewed and are negative.      Objective:   Physical Exam Orientated x3 patient presents with sister and brother  Vascular: DP pulses 1/4 bilaterally PT pulses 1/4 bilaterally  Neurological: Sensation to 10 g monofilament wire intact 5/5 bilaterally Vibratory sensation intact bilaterally Ankle reflex equal and reactive bilaterally  Dermatological: The toenails are hypertrophic, discolored, hypertrophied 6-10  Musculoskeletal: Pes planus bilaterally No restriction ankle, subtalar, midtarsal joints bilaterally       Assessment & Plan:   Assessment: Symptomatic onychomycoses 6-10  Plan: Debrided toenails x10 without a bleeding  Reappoint at three-month intervals

## 2014-01-01 ENCOUNTER — Encounter: Payer: Self-pay | Admitting: Podiatry

## 2014-01-16 ENCOUNTER — Encounter: Payer: Self-pay | Admitting: Physician Assistant

## 2014-01-16 ENCOUNTER — Ambulatory Visit (INDEPENDENT_AMBULATORY_CARE_PROVIDER_SITE_OTHER): Payer: Medicare Other | Admitting: Physician Assistant

## 2014-01-16 VITALS — BP 166/69 | HR 57 | Temp 97.8°F | Resp 16 | Ht 65.25 in | Wt 247.2 lb

## 2014-01-16 DIAGNOSIS — I10 Essential (primary) hypertension: Secondary | ICD-10-CM

## 2014-01-16 LAB — BASIC METABOLIC PANEL
BUN: 14 mg/dL (ref 6–23)
CHLORIDE: 101 meq/L (ref 96–112)
CO2: 31 mEq/L (ref 19–32)
Calcium: 9.4 mg/dL (ref 8.4–10.5)
Creatinine, Ser: 1.2 mg/dL (ref 0.4–1.5)
GFR: 81.45 mL/min (ref >60.00)
GLUCOSE: 87 mg/dL (ref 70–99)
Potassium: 4 mEq/L (ref 3.5–5.1)
Sodium: 140 mEq/L (ref 135–145)

## 2014-01-16 MED ORDER — HYDROCHLOROTHIAZIDE 25 MG PO TABS: 25.0000 mg | ORAL_TABLET | Freq: Two times a day (BID) | ORAL | Status: DC

## 2014-01-16 MED ORDER — POTASSIUM CHLORIDE CRYS ER 20 MEQ PO TBCR: 20.0000 meq | EXTENDED_RELEASE_TABLET | Freq: Every day | ORAL | Status: DC

## 2014-01-16 NOTE — Assessment & Plan Note (Signed)
Asymptomatic.  Physical exam unremarkable.  Patient endorses taking medications as directed.  Renal Artery US unremarkable.  Increase HCTZ to 25 mg BID.  Potassium supplement given.  Continue other medications as directed. Will check BMP today. Follow-up in 1 month.

## 2014-01-16 NOTE — Patient Instructions (Signed)
I have sent in a new bottle of your Hydrochlorothiazide.  You are to take 1 tablet by mouth twice daily.  Take the potassium supplement I have sent in as directed.  Watch your salt intake.  Follow-up with me in 1 month.   Do not forget to stop by the lab today.  Hypertension Hypertension, commonly called high blood pressure, is when the force of blood pumping through your arteries is too strong. Your arteries are the blood vessels that carry blood from your heart throughout your body. A blood pressure reading consists of a higher number over a lower number, such as 110/72. The higher number (systolic) is the pressure inside your arteries when your heart pumps. The lower number (diastolic) is the pressure inside your arteries when your heart relaxes. Ideally you want your blood pressure below 120/80. Hypertension forces your heart to work harder to pump blood. Your arteries may become narrow or stiff. Having hypertension puts you at risk for heart disease, stroke, and other problems.  RISK FACTORS Some risk factors for high blood pressure are controllable. Others are not.  Risk factors you cannot control include:   Race. You may be at higher risk if you are African American.  Age. Risk increases with age.  Gender. Men are at higher risk than women before age 36 years. After age 70, women are at higher risk than men. Risk factors you can control include:  Not getting enough exercise or physical activity.  Being overweight.  Getting too much fat, sugar, calories, or salt in your diet.  Drinking too much alcohol. SIGNS AND SYMPTOMS Hypertension does not usually cause signs or symptoms. Extremely high blood pressure (hypertensive crisis) may cause headache, anxiety, shortness of breath, and nosebleed. DIAGNOSIS  To check if you have hypertension, your health care provider will measure your blood pressure while you are seated, with your arm held at the level of your heart. It should be measured  at least twice using the same arm. Certain conditions can cause a difference in blood pressure between your right and left arms. A blood pressure reading that is higher than normal on one occasion does not mean that you need treatment. If one blood pressure reading is high, ask your health care provider about having it checked again. TREATMENT  Treating high blood pressure includes making lifestyle changes and possibly taking medicine. Living a healthy lifestyle can help lower high blood pressure. You may need to change some of your habits. Lifestyle changes may include:  Following the DASH diet. This diet is high in fruits, vegetables, and whole grains. It is low in salt, red meat, and added sugars.  Getting at least 2 hours of brisk physical activity every week.  Losing weight if necessary.  Not smoking.  Limiting alcoholic beverages.  Learning ways to reduce stress. If lifestyle changes are not enough to get your blood pressure under control, your health care provider may prescribe medicine. You may need to take more than one. Work closely with your health care provider to understand the risks and benefits. HOME CARE INSTRUCTIONS  Have your blood pressure rechecked as directed by your health care provider.   Take medicines only as directed by your health care provider. Follow the directions carefully. Blood pressure medicines must be taken as prescribed. The medicine does not work as well when you skip doses. Skipping doses also puts you at risk for problems.   Do not smoke.   Monitor your blood pressure at home as directed  by your health care provider. SEEK MEDICAL CARE IF:   You think you are having a reaction to medicines taken.  You have recurrent headaches or feel dizzy.  You have swelling in your ankles.  You have trouble with your vision. SEEK IMMEDIATE MEDICAL CARE IF:  You develop a severe headache or confusion.  You have unusual weakness, numbness, or feel  faint.  You have severe chest or abdominal pain.  You vomit repeatedly.  You have trouble breathing. MAKE SURE YOU:   Understand these instructions.  Will watch your condition.  Will get help right away if you are not doing well or get worse. Document Released: 03/15/2005 Document Revised: 07/30/2013 Document Reviewed: 01/05/2013 [facility] Patient Information 2015 [place], [place]. This information is not intended to replace advice given to you by your health care provider. Make sure you discuss any questions you have with your health care provider.

## 2014-01-16 NOTE — Progress Notes (Signed)
Pre visit review using our clinic review tool, if applicable. No additional management support is needed unless otherwise documented below in the visit note/SLS  

## 2014-01-16 NOTE — Progress Notes (Signed)
Patient presents to clinic today for 68-month follow-up of hypertension.  Patient previously controlled on Metoprolol 50mg  QD, HCTZ 25 mg QD, and Lisinopril 30 mg QD.    BP Readings from Last 3 Encounters:  01/16/14 166/69  12/31/13 199/97  10/12/13 142/88   BP significantly elevated at visit with Podiatry.  Also elevated at 166/69 today.  Pulse at 57.  Patient endorses taking medications as directed.  Denies chest pain, palpitations, vision changes, headaches, dizziness or nosebleed.  Renal artery [place] within normal limits. Endorses limiting salt and drinking plenty of water.  Past Medical History  Diagnosis Date  . Hypertension   . Dementia   . Depressed   . Hyperlipemia   . Chicken pox   . Mumps   . Measles   . Schizophrenia simplex     Symptoms w/depression    Current Outpatient Prescriptions on File Prior to Visit  Medication Sig Dispense Refill  . ARIPiprazole (ABILIFY) 10 MG tablet TAKE 1 TABLET BY MOUTH EVERY DAY  90 tablet  1  . Calcium-Vitamin D 600-200 MG-UNIT per tablet Take 1 tablet by mouth daily.      . finasteride (PROSCAR) 5 MG tablet Take 5 mg by mouth daily.      Marland Kitchen lisinopril (PRINIVIL,ZESTRIL) 30 MG tablet Take 1 tablet (30 mg total) by mouth daily.  30 tablet  2  . metoprolol (LOPRESSOR) 50 MG tablet TAKE 1 TABLET BY MOUTH TWICE A DAY  60 tablet  2  . ranitidine (ZANTAC) 150 MG capsule TAKE ONE CAPSULE BY MOUTH TWICE A DAY  60 capsule  2  . simvastatin (ZOCOR) 40 MG tablet TAKE 1 TABLET BY MOUTH EVERY EVENING  30 tablet  2   No current facility-administered medications on file prior to visit.    No Known Allergies  Family History  Problem Relation Age of Onset  . Hypertension Father   . Hyperlipidemia Father   . Congestive Heart Failure Father 25    Deceased-1995  . Diabetes Mother   . Kidney failure Mother 87    Deceased-2005  . Hypertension Mother   . Congestive Heart Failure Maternal Grandmother   . Heart failure Paternal Grandmother   .  Heart attack Paternal Grandmother     History   Social History  . Marital Status: Single    Spouse Name: N/A    Number of Children: N/A  . Years of Education: N/A   Social History Main Topics  . Smoking status: Never Smoker   . Smokeless tobacco: Never Used  . Alcohol Use: No  . Drug Use: No  . Sexual Activity: No   Other Topics Concern  . None   Social History Narrative  . None   Review of Systems - See HPI.  All other ROS are negative.  BP 166/69  Pulse 57  Temp(Src) 97.8 F (36.6 C) (Oral)  Resp 16  Ht 5' 5.25" (1.657 m)  Wt 247 lb 4 oz (112.152 kg)  BMI 40.85 kg/m2  SpO2 100%  Physical Exam  Vitals reviewed. Constitutional: He is oriented to person, place, and time and well-developed, well-nourished, and in no distress.  HENT:  Head: Normocephalic and atraumatic.  Eyes: Conjunctivae are normal.  Neck: No thyromegaly present.  Cardiovascular: Normal rate, regular rhythm, normal heart sounds and intact distal pulses.   Pulmonary/Chest: Effort normal and breath sounds normal. No respiratory distress. He has no wheezes. He has no rales. He exhibits no tenderness.  Neurological: He is alert and  oriented to person, place, and time.  Skin: Skin is warm and dry. No rash noted.  Psychiatric: Affect normal.   Assessment/Plan: Essential hypertension Asymptomatic.  Physical exam unremarkable.  Patient endorses taking medications as directed.  Renal Artery US unremarkable.  Increase HCTZ to 25 mg BID.  Potassium supplement given.  Continue other medications as directed. Will check BMP today. Follow-up in 1 month.

## 2014-01-28 ENCOUNTER — Telehealth: Payer: Self-pay | Admitting: Physician Assistant

## 2014-01-28 NOTE — Telephone Encounter (Signed)
Received report from patient's urologist, Dr. Estill Dooms in regards to last visit.  Patient with significantly elevated PSA level concerning for prostate cancer.  Patient is refusing biopsy.  Patient has also has resistant HTN recently despite being well-controlled on current regimen. Attempting to contact patient to encourage him to have the prostate biopsy.  Number listed in chart states has been disconnected.  Patient has upcoming follow-up scheduled.  Will address with him at that time.

## 2014-02-02 ENCOUNTER — Other Ambulatory Visit: Payer: Self-pay | Admitting: Physician Assistant

## 2014-02-04 NOTE — Telephone Encounter (Signed)
Rx request to pharmacy/[facility]  

## 2014-02-06 ENCOUNTER — Other Ambulatory Visit: Payer: Self-pay | Admitting: Physician Assistant

## 2014-02-06 NOTE — Telephone Encounter (Signed)
Medication Detail      Disp Refills Start End     hydrochlorothiazide (HYDRODIURIL) 25 MG tablet 180 tablet 1 01/16/2014     Sig - Route: Take 1 tablet (25 mg total) by mouth 2 (two) times daily. - Oral    E-Prescribing Status: Receipt confirmed by pharmacy (01/16/2014 8:06 AM EDT)     Associated Diagnoses    Essential hypertension - Primary       Pharmacy    [id number] [place], [place] - [address]

## 2014-02-18 ENCOUNTER — Ambulatory Visit: Payer: Medicare Other | Admitting: Physician Assistant

## 2014-04-01 ENCOUNTER — Ambulatory Visit: Payer: Medicare Other | Admitting: Podiatry

## 2014-04-02 ENCOUNTER — Encounter: Payer: Self-pay | Admitting: Physician Assistant

## 2014-04-02 ENCOUNTER — Ambulatory Visit (INDEPENDENT_AMBULATORY_CARE_PROVIDER_SITE_OTHER): Payer: Medicare Other | Admitting: Physician Assistant

## 2014-04-02 VITALS — BP 166/66 | HR 59 | Temp 98.0°F | Resp 18 | Ht 65.25 in | Wt 242.1 lb

## 2014-04-02 DIAGNOSIS — I10 Essential (primary) hypertension: Secondary | ICD-10-CM

## 2014-04-02 MED ORDER — POTASSIUM CHLORIDE CRYS ER 20 MEQ PO TBCR: 20.0000 meq | EXTENDED_RELEASE_TABLET | Freq: Every day | ORAL | Status: DC

## 2014-04-02 MED ORDER — LISINOPRIL 30 MG PO TABS: 30.0000 mg | ORAL_TABLET | Freq: Every day | ORAL | Status: DC

## 2014-04-02 NOTE — Patient Instructions (Signed)
Please take medications as directed now that refills have been sent to your pharmacy.  Please call to schedule your prostate biopsy with your Urologist as I am concerned this resistant high blood pressure is a symptom of untreated prostate cancer.  Hypertension Hypertension, commonly called high blood pressure, is when the force of blood pumping through your arteries is too strong. Your arteries are the blood vessels that carry blood from your heart throughout your body. A blood pressure reading consists of a higher number over a lower number, such as 110/72. The higher number (systolic) is the pressure inside your arteries when your heart pumps. The lower number (diastolic) is the pressure inside your arteries when your heart relaxes. Ideally you want your blood pressure below 120/80. Hypertension forces your heart to work harder to pump blood. Your arteries may become narrow or stiff. Having hypertension puts you at risk for heart disease, stroke, and other problems.  RISK FACTORS Some risk factors for high blood pressure are controllable. Others are not.  Risk factors you cannot control include:   Race. You may be at higher risk if you are African American.  Age. Risk increases with age.  Gender. Men are at higher risk than women before age 6 years. After age 30, women are at higher risk than men. Risk factors you can control include:  Not getting enough exercise or physical activity.  Being overweight.  Getting too much fat, sugar, calories, or salt in your diet.  Drinking too much alcohol. SIGNS AND SYMPTOMS Hypertension does not usually cause signs or symptoms. Extremely high blood pressure (hypertensive crisis) may cause headache, anxiety, shortness of breath, and nosebleed. DIAGNOSIS  To check if you have hypertension, your health care provider will measure your blood pressure while you are seated, with your arm held at the level of your heart. It should be measured at least twice  using the same arm. Certain conditions can cause a difference in blood pressure between your right and left arms. A blood pressure reading that is higher than normal on one occasion does not mean that you need treatment. If one blood pressure reading is high, ask your health care provider about having it checked again. TREATMENT  Treating high blood pressure includes making lifestyle changes and possibly taking medicine. Living a healthy lifestyle can help lower high blood pressure. You may need to change some of your habits. Lifestyle changes may include:  Following the DASH diet. This diet is high in fruits, vegetables, and whole grains. It is low in salt, red meat, and added sugars.  Getting at least 2 hours of brisk physical activity every week.  Losing weight if necessary.  Not smoking.  Limiting alcoholic beverages.  Learning ways to reduce stress. If lifestyle changes are not enough to get your blood pressure under control, your health care provider may prescribe medicine. You may need to take more than one. Work closely with your health care provider to understand the risks and benefits. HOME CARE INSTRUCTIONS  Have your blood pressure rechecked as directed by your health care provider.   Take medicines only as directed by your health care provider. Follow the directions carefully. Blood pressure medicines must be taken as prescribed. The medicine does not work as well when you skip doses. Skipping doses also puts you at risk for problems.   Do not smoke.   Monitor your blood pressure at home as directed by your health care provider. SEEK MEDICAL CARE IF:   You think you  are having a reaction to medicines taken.  You have recurrent headaches or feel dizzy.  You have swelling in your ankles.  You have trouble with your vision. SEEK IMMEDIATE MEDICAL CARE IF:  You develop a severe headache or confusion.  You have unusual weakness, numbness, or feel faint.  You  have severe chest or abdominal pain.  You vomit repeatedly.  You have trouble breathing. MAKE SURE YOU:   Understand these instructions.  Will watch your condition.  Will get help right away if you are not doing well or get worse. Document Released: 03/15/2005 Document Revised: 07/30/2013 Document Reviewed: 01/05/2013 [facility] Patient Information 2015 [employer], [place]. This information is not intended to replace advice given to you by your health care provider. Make sure you discuss any questions you have with your health care provider.

## 2014-04-02 NOTE — Progress Notes (Signed)
Pre visit review using our clinic review tool, if applicable. No additional management support is needed unless otherwise documented below in the visit note/SLS  

## 2014-04-06 NOTE — Progress Notes (Signed)
Patient presents to clinic today for follow-up of treatment-resistant hypertension.  Has completed renal US that was without evidence of artery stenosis.  BP medications were adjusted at last visit to help with BP elevation.  Patient endorses taking as directed.  Has been out of his Lisinopril for several days. Denies chest pain, palpitations, lightheadedness or dizziness.  Past Medical History  Diagnosis Date  . Hypertension   . Dementia   . Depressed   . Hyperlipemia   . Chicken pox   . Mumps   . Measles   . Schizophrenia simplex     Symptoms w/depression    Current Outpatient Prescriptions on File Prior to Visit  Medication Sig Dispense Refill  . ARIPiprazole (ABILIFY) 10 MG tablet TAKE 1 TABLET BY MOUTH EVERY DAY 90 tablet 1  . Calcium-Vitamin D 600-200 MG-UNIT per tablet Take 1 tablet by mouth daily.    . finasteride (PROSCAR) 5 MG tablet Take 5 mg by mouth daily.    . hydrochlorothiazide (HYDRODIURIL) 25 MG tablet Take 1 tablet (25 mg total) by mouth 2 (two) times daily. 180 tablet 1  . metoprolol (LOPRESSOR) 50 MG tablet TAKE 1 TABLET BY MOUTH TWICE A DAY 180 tablet 0  . ranitidine (ZANTAC) 150 MG capsule TAKE ONE CAPSULE BY MOUTH TWICE A DAY 60 capsule 2  . simvastatin (ZOCOR) 40 MG tablet TAKE 1 TABLET BY MOUTH EVERY EVENING 30 tablet 2   No current facility-administered medications on file prior to visit.    No Known Allergies  Family History  Problem Relation Age of Onset  . Hypertension Father   . Hyperlipidemia Father   . Congestive Heart Failure Father 60    Deceased-1995  . Diabetes Mother   . Kidney failure Mother 33    Deceased-2005  . Hypertension Mother   . Congestive Heart Failure Maternal Grandmother   . Heart failure Paternal Grandmother   . Heart attack Paternal Grandmother     History   Social History  . Marital Status: Single    Spouse Name: N/A    Number of Children: N/A  . Years of Education: N/A   Social History Main Topics  .  Smoking status: Never Smoker   . Smokeless tobacco: Never Used  . Alcohol Use: No  . Drug Use: No  . Sexual Activity: No   Other Topics Concern  . None   Social History Narrative   Review of Systems - See HPI.  All other ROS are negative.  BP 166/66 mmHg  Pulse 59  Temp(Src) 98 F (36.7 C) (Oral)  Resp 18  Ht 5' 5.25" (1.657 m)  Wt 242 lb 2 oz (109.827 kg)  BMI 40.00 kg/m2  SpO2 97%  Physical Exam  Constitutional: He is oriented to person, place, and time and well-developed, well-nourished, and in no distress.  HENT:  Head: Normocephalic and atraumatic.  Cardiovascular: Normal rate, regular rhythm, normal heart sounds and intact distal pulses.   Pulmonary/Chest: Effort normal and breath sounds normal. No respiratory distress. He has no wheezes. He has no rales. He exhibits no tenderness.  Neurological: He is alert and oriented to person, place, and time.  Skin: Skin is warm and dry. No rash noted.  Psychiatric: Affect normal.  Vitals reviewed.   Recent Results (from the past 2160 hour(s))  Basic Metabolic Panel (BMET)     Status: None   Collection Time: 01/16/14  8:16 AM  Result Value Ref Range   Sodium 140 135 - 145 mEq/L  Potassium 4.0 3.5 - 5.1 mEq/L   Chloride 101 96 - 112 mEq/L   CO2 31 19 - 32 mEq/L   Glucose, Bld 87 70 - 99 mg/dL   BUN 14 6 - 23 mg/dL   Creatinine, Ser 1.2 0.4 - 1.5 mg/dL   Calcium 9.4 8.4 - 10.5 mg/dL   GFR 81.45 >60.00 mL/min    Assessment/Plan: Essential hypertension Medications refilled.  Discussed importance of medication compliance with patient.  Will need to recheck BP when patient is taking all medications as directed.  Does have elevated PSA with significant concern for prostate cancer per Urologist.  Patient previously declined biopsy but is willing now.  Concern in resistant HTN may be symptom of malignancy.

## 2014-04-06 NOTE — Assessment & Plan Note (Signed)
Medications refilled.  Discussed importance of medication compliance with patient.  Will need to recheck BP when patient is taking all medications as directed.  Does have elevated PSA with significant concern for prostate cancer per Urologist.  Patient previously declined biopsy but is willing now.  Concern in resistant HTN may be symptom of malignancy.

## 2014-04-07 ENCOUNTER — Other Ambulatory Visit: Payer: Self-pay | Admitting: Physician Assistant

## 2014-04-08 NOTE — Telephone Encounter (Signed)
Rx request to pharmacy/[facility]  

## 2014-05-03 ENCOUNTER — Other Ambulatory Visit: Payer: Self-pay | Admitting: Physician Assistant

## 2014-05-03 NOTE — Telephone Encounter (Signed)
Rx request to pharmacy/[facility]  

## 2014-05-10 ENCOUNTER — Other Ambulatory Visit: Payer: Self-pay | Admitting: Physician Assistant

## 2014-05-10 NOTE — Telephone Encounter (Signed)
Rx request to pharmacy/[facility]  

## 2014-09-30 ENCOUNTER — Other Ambulatory Visit: Payer: Self-pay | Admitting: Physician Assistant

## 2014-10-02 ENCOUNTER — Encounter: Payer: Self-pay | Admitting: Physician Assistant

## 2014-10-02 ENCOUNTER — Ambulatory Visit (INDEPENDENT_AMBULATORY_CARE_PROVIDER_SITE_OTHER): Payer: Medicare Other | Admitting: Physician Assistant

## 2014-10-02 ENCOUNTER — Other Ambulatory Visit: Payer: Self-pay | Admitting: Physician Assistant

## 2014-10-02 VITALS — BP 186/70 | HR 58 | Temp 97.9°F | Ht 65.25 in | Wt 241.4 lb

## 2014-10-02 DIAGNOSIS — I10 Essential (primary) hypertension: Secondary | ICD-10-CM

## 2014-10-02 LAB — BASIC METABOLIC PANEL
BUN: 14 mg/dL (ref 6–23)
CHLORIDE: 99 meq/L (ref 96–112)
CO2: 31 meq/L (ref 19–32)
Calcium: 9.5 mg/dL (ref 8.4–10.5)
Creatinine, Ser: 1.01 mg/dL (ref 0.40–1.50)
GFR: 94.41 mL/min (ref >60.00)
Glucose, Bld: 111 mg/dL — ABNORMAL HIGH (ref 70–99)
Potassium: 3.9 mEq/L (ref 3.5–5.1)
Sodium: 138 mEq/L (ref 135–145)

## 2014-10-02 LAB — HEPATIC FUNCTION PANEL
ALT: 21 U/L (ref 0–53)
AST: 16 U/L (ref 0–37)
Albumin: 3.8 g/dL (ref 3.5–5.2)
Alkaline Phosphatase: 83 U/L (ref 39–117)
Bilirubin, Direct: 0.1 mg/dL (ref 0.0–0.3)
Total Bilirubin: 0.5 mg/dL (ref 0.2–1.2)
Total Protein: 7.5 g/dL (ref 6.0–8.3)

## 2014-10-02 MED ORDER — METOPROLOL TARTRATE 50 MG PO TABS: 50.0000 mg | ORAL_TABLET | Freq: Two times a day (BID) | ORAL | Status: DC

## 2014-10-02 MED ORDER — LISINOPRIL 30 MG PO TABS: 30.0000 mg | ORAL_TABLET | Freq: Every day | ORAL | Status: DC

## 2014-10-02 MED ORDER — HYDROCHLOROTHIAZIDE 25 MG PO TABS: 25.0000 mg | ORAL_TABLET | Freq: Two times a day (BID) | ORAL | Status: DC

## 2014-10-02 MED ORDER — POTASSIUM CHLORIDE CRYS ER 20 MEQ PO TBCR: 20.0000 meq | EXTENDED_RELEASE_TABLET | Freq: Every day | ORAL | Status: DC

## 2014-10-02 MED ORDER — CLONIDINE HCL 0.1 MG PO TABS: 0.1000 mg | ORAL_TABLET | Freq: Three times a day (TID) | ORAL | Status: DC

## 2014-10-02 MED ORDER — SIMVASTATIN 40 MG PO TABS: 40.0000 mg | ORAL_TABLET | Freq: Every evening | ORAL | Status: DC

## 2014-10-02 MED ORDER — ARIPIPRAZOLE 10 MG PO TABS: 10.0000 mg | ORAL_TABLET | Freq: Every day | ORAL | Status: DC

## 2014-10-02 MED ORDER — RANITIDINE HCL 150 MG PO CAPS: 150.0000 mg | ORAL_CAPSULE | Freq: Two times a day (BID) | ORAL | Status: DC

## 2014-10-02 NOTE — Patient Instructions (Signed)
Please continue medications as directed. Start the Clonidine three times daily. Watch your salt intake.   Please schedule an appointment with Dr. Estill Dooms as soon as possible. There is concern for prostate cancer and you need to be seen.

## 2014-10-02 NOTE — Progress Notes (Signed)
Pre visit review using our clinic review tool, if applicable. No additional management support is needed unless otherwise documented below in the visit note. 

## 2014-10-02 NOTE — Progress Notes (Signed)
    Patient presents to clinic today for follow-up of hypertension. Patient currently on multi-drug regimen including Metoprolol 50 mg BID, HCTZ 25 mg BID and lisinopril 30 mg QD. Endorses taking medications as directed. Pill count correct. Patient denies chest pain, palpitations, lightheadedness, dizziness, vision changes or frequent headaches.  BP Readings from Last 3 Encounters:  10/02/14 186/70  04/02/14 166/66  01/16/14 166/69   Past Medical History  Diagnosis Date  . Hypertension   . Dementia   . Depressed   . Hyperlipemia   . Chicken pox   . Mumps   . Measles   . Schizophrenia simplex     Symptoms w/depression    Current Outpatient Prescriptions on File Prior to Visit  Medication Sig Dispense Refill  . Calcium-Vitamin D 600-200 MG-UNIT per tablet Take 1 tablet by mouth daily.    . finasteride (PROSCAR) 5 MG tablet Take 5 mg by mouth daily.     No current facility-administered medications on file prior to visit.    No Known Allergies  Family History  Problem Relation Age of Onset  . Hypertension Father   . Hyperlipidemia Father   . Congestive Heart Failure Father 59    Deceased-1995  . Diabetes Mother   . Kidney failure Mother 44    Deceased-2005  . Hypertension Mother   . Congestive Heart Failure Maternal Grandmother   . Heart failure Paternal Grandmother   . Heart attack Paternal Grandmother     History   Social History  . Marital Status: Single    Spouse Name: N/A  . Number of Children: N/A  . Years of Education: N/A   Social History Main Topics  . Smoking status: Never Smoker   . Smokeless tobacco: Never Used  . Alcohol Use: No  . Drug Use: No  . Sexual Activity: No   Other Topics Concern  . None   Social History Narrative   Review of Systems - See HPI.  All other ROS are negative.  BP 186/70 mmHg  Pulse 58  Temp(Src) 97.9 F (36.6 C) (Oral)  Ht 5' 5.25" (1.657 m)  Wt 241 lb 6.4 oz (109.498 kg)  BMI 39.88 kg/m2  SpO2  100%  Physical Exam  Constitutional: He is oriented to person, place, and time and well-developed, well-nourished, and in no distress.  HENT:  Head: Normocephalic and atraumatic.  Eyes: Conjunctivae and EOM are normal. Pupils are equal, round, and reactive to light.  Neck: Neck supple.  Cardiovascular: Normal rate, regular rhythm, normal heart sounds and intact distal pulses.   Pulmonary/Chest: Effort normal and breath sounds normal. No respiratory distress. He has no wheezes. He has no rales. He exhibits no tenderness.  Neurological: He is alert and oriented to person, place, and time.  Skin: Skin is warm and dry. No rash noted.  Psychiatric: Affect normal.  Vitals reviewed.  Assessment/Plan: Essential hypertension Still uncontrolled. Previous Renal artery [place] negative for RAS. Labs previously unremarkable. Will continue current regimen and add Clonidine 0.1 mg TID. DASH diet again encouraged. Follow-up 2 weeks for reassessment of BP.

## 2014-10-02 NOTE — Assessment & Plan Note (Signed)
Still uncontrolled. Previous Renal artery [place] negative for RAS. Labs previously unremarkable. Will continue current regimen and add Clonidine 0.1 mg TID. DASH diet again encouraged. Follow-up 2 weeks for reassessment of BP.

## 2014-10-03 ENCOUNTER — Other Ambulatory Visit: Payer: Self-pay | Admitting: Physician Assistant

## 2014-10-16 ENCOUNTER — Ambulatory Visit (INDEPENDENT_AMBULATORY_CARE_PROVIDER_SITE_OTHER): Payer: Medicare Other | Admitting: Physician Assistant

## 2014-10-16 ENCOUNTER — Encounter: Payer: Self-pay | Admitting: Physician Assistant

## 2014-10-16 VITALS — BP 132/82 | HR 57 | Temp 98.5°F | Ht 67.0 in | Wt 239.1 lb

## 2014-10-16 DIAGNOSIS — Z23 Encounter for immunization: Secondary | ICD-10-CM

## 2014-10-16 DIAGNOSIS — I10 Essential (primary) hypertension: Secondary | ICD-10-CM

## 2014-10-16 NOTE — Patient Instructions (Signed)
Your blood pressure is much improved, which is fantastic. Please continue your medications as directed. We will follow-up in 6 months to reassess your BP.  We can do your physical at that time as well.

## 2014-10-16 NOTE — Progress Notes (Signed)
Pre visit review using our clinic review tool, if applicable. No additional management support is needed unless otherwise documented below in the visit note. 

## 2014-10-16 NOTE — Assessment & Plan Note (Signed)
BP finally improved with addition of clonidine 0.1 mg TID. BP normotensive today. Asymptomatic. Will continue current regimen. Will follow-up 6 months.

## 2014-10-16 NOTE — Progress Notes (Signed)
Patient presents to clinic today for follow-up of hypertension, previously uncontrolled. Patient previously on Lisinopril 30 mg QD, HCTZ 25 mg QD and Lopressor 50 mg BID. At last visit patient started on Clonidine 0.1 TID. Patient endorses taking as directed. Denies side effect of medication. Patient denies chest pain, palpitations, lightheadedness, dizziness, vision changes or frequent headaches.  BP Readings from Last 3 Encounters:  10/16/14 132/82  10/02/14 186/70  04/02/14 166/66    Past Medical History  Diagnosis Date  . Hypertension   . Dementia   . Depressed   . Hyperlipemia   . Chicken pox   . Mumps   . Measles   . Schizophrenia simplex     Symptoms w/depression    Current Outpatient Prescriptions on File Prior to Visit  Medication Sig Dispense Refill  . ARIPiprazole (ABILIFY) 10 MG tablet Take 1 tablet (10 mg total) by mouth daily. 90 tablet 1  . Calcium-Vitamin D 600-200 MG-UNIT per tablet Take 1 tablet by mouth daily.    . cloNIDine (CATAPRES) 0.1 MG tablet Take 1 tablet (0.1 mg total) by mouth 3 (three) times daily. 90 tablet 3  . finasteride (PROSCAR) 5 MG tablet Take 5 mg by mouth daily.    . hydrochlorothiazide (HYDRODIURIL) 25 MG tablet Take 1 tablet (25 mg total) by mouth 2 (two) times daily. 180 tablet 0  . KLOR-CON M20 20 MEQ tablet TAKE 1 TABLET (20 MEQ TOTAL) BY MOUTH DAILY. 90 tablet 1  . lisinopril (PRINIVIL,ZESTRIL) 30 MG tablet Take 1 tablet (30 mg total) by mouth daily. 90 tablet 1  . metoprolol (LOPRESSOR) 50 MG tablet Take 1 tablet (50 mg total) by mouth 2 (two) times daily. 180 tablet 1  . potassium chloride SA (K-DUR,KLOR-CON) 20 MEQ tablet Take 1 tablet (20 mEq total) by mouth daily. 90 tablet 1  . ranitidine (ZANTAC) 150 MG capsule Take 1 capsule (150 mg total) by mouth 2 (two) times daily. 180 capsule 1  . simvastatin (ZOCOR) 40 MG tablet Take 1 tablet (40 mg total) by mouth every evening. 90 tablet 1   No current facility-administered  medications on file prior to visit.    No Known Allergies  Family History  Problem Relation Age of Onset  . Hypertension Father   . Hyperlipidemia Father   . Congestive Heart Failure Father 57    Deceased-1995  . Diabetes Mother   . Kidney failure Mother 45    Deceased-2005  . Hypertension Mother   . Congestive Heart Failure Maternal Grandmother   . Heart failure Paternal Grandmother   . Heart attack Paternal Grandmother     History   Social History  . Marital Status: Single    Spouse Name: N/A  . Number of Children: N/A  . Years of Education: N/A   Social History Main Topics  . Smoking status: Never Smoker   . Smokeless tobacco: Never Used  . Alcohol Use: No  . Drug Use: No  . Sexual Activity: No   Other Topics Concern  . None   Social History Narrative   Review of Systems - See HPI.  All other ROS are negative.  BP 132/82 mmHg  Pulse 57  Temp(Src) 98.5 F (36.9 C) (Oral)  Ht 5\' 7"  (1.702 m)  Wt 239 lb 2 oz (108.466 kg)  BMI 37.44 kg/m2  SpO2 96%  Physical Exam  Constitutional: He is oriented to person, place, and time and well-developed, well-nourished, and in no distress.  HENT:  Head: Normocephalic and atraumatic.  Eyes: Conjunctivae are normal.  Neck: Neck supple.  Cardiovascular: Normal rate, regular rhythm, normal heart sounds and intact distal pulses.   Pulmonary/Chest: Effort normal and breath sounds normal. No respiratory distress. He has no wheezes. He has no rales. He exhibits no tenderness.  Neurological: He is alert and oriented to person, place, and time.  Skin: Skin is warm and dry. No rash noted.  Vitals reviewed.   Recent Results (from the past 2160 hour(s))  Basic Metabolic Panel (BMET)     Status: Abnormal   Collection Time: 10/02/14  7:46 AM  Result Value Ref Range   Sodium 138 135 - 145 mEq/L   Potassium 3.9 3.5 - 5.1 mEq/L   Chloride 99 96 - 112 mEq/L   CO2 31 19 - 32 mEq/L   Glucose, Bld 111 (H) 70 - 99 mg/dL   BUN 14 6  - 23 mg/dL   Creatinine, Ser 1.01 0.40 - 1.50 mg/dL   Calcium 9.5 8.4 - 10.5 mg/dL   GFR 94.41 >60.00 mL/min  Hepatic function panel     Status: None   Collection Time: 10/02/14  7:46 AM  Result Value Ref Range   Total Bilirubin 0.5 0.2 - 1.2 mg/dL   Bilirubin, Direct 0.1 0.0 - 0.3 mg/dL   Alkaline Phosphatase 83 39 - 117 U/L   AST 16 0 - 37 U/L   ALT 21 0 - 53 U/L   Total Protein 7.5 6.0 - 8.3 g/dL   Albumin 3.8 3.5 - 5.2 g/dL    Assessment/Plan: Essential hypertension BP finally improved with addition of clonidine 0.1 mg TID. BP normotensive today. Asymptomatic. Will continue current regimen. Will follow-up 6 months.

## 2014-10-16 NOTE — Addendum Note (Signed)
Addended by: Sharon Seller B on: 10/16/2014 07:38 AM   Modules accepted: Orders

## 2014-12-29 ENCOUNTER — Other Ambulatory Visit: Payer: Self-pay | Admitting: Physician Assistant

## 2014-12-30 NOTE — Telephone Encounter (Signed)
Medication Detail      Disp Refills Start End     ARIPiprazole (ABILIFY) 10 MG tablet 90 tablet 1 10/02/2014     Sig - Route: Take 1 tablet (10 mg total) by mouth daily. - Oral    E-Prescribing Status: Receipt confirmed by pharmacy (10/02/2014 7:34 AM EDT)   Pharmacy    [id number] - [place], [place] [address]

## 2015-02-11 ENCOUNTER — Other Ambulatory Visit: Payer: Self-pay | Admitting: Physician Assistant

## 2015-04-18 ENCOUNTER — Other Ambulatory Visit: Payer: Self-pay | Admitting: Physician Assistant

## 2015-04-18 ENCOUNTER — Encounter: Payer: Self-pay | Admitting: Physician Assistant

## 2015-04-18 ENCOUNTER — Ambulatory Visit (INDEPENDENT_AMBULATORY_CARE_PROVIDER_SITE_OTHER): Payer: Medicare Other | Admitting: Physician Assistant

## 2015-04-18 VITALS — BP 118/62 | HR 53 | Temp 95.3°F | Ht 67.0 in | Wt 232.6 lb

## 2015-04-18 DIAGNOSIS — E785 Hyperlipidemia, unspecified: Secondary | ICD-10-CM

## 2015-04-18 DIAGNOSIS — Z23 Encounter for immunization: Secondary | ICD-10-CM

## 2015-04-18 DIAGNOSIS — R739 Hyperglycemia, unspecified: Secondary | ICD-10-CM | POA: Diagnosis not present

## 2015-04-18 DIAGNOSIS — F319 Bipolar disorder, unspecified: Secondary | ICD-10-CM

## 2015-04-18 DIAGNOSIS — Z0000 Encounter for general adult medical examination without abnormal findings: Secondary | ICD-10-CM

## 2015-04-18 DIAGNOSIS — R972 Elevated prostate specific antigen [PSA]: Secondary | ICD-10-CM

## 2015-04-18 DIAGNOSIS — Z136 Encounter for screening for cardiovascular disorders: Secondary | ICD-10-CM

## 2015-04-18 DIAGNOSIS — I10 Essential (primary) hypertension: Secondary | ICD-10-CM | POA: Diagnosis not present

## 2015-04-18 LAB — COMPREHENSIVE METABOLIC PANEL
ALBUMIN: 4 g/dL (ref 3.5–5.2)
ALK PHOS: 82 U/L (ref 39–117)
ALT: 12 U/L (ref 0–53)
AST: 15 U/L (ref 0–37)
BUN: 18 mg/dL (ref 6–23)
CALCIUM: 9.5 mg/dL (ref 8.4–10.5)
CHLORIDE: 98 meq/L (ref 96–112)
CO2: 31 mEq/L (ref 19–32)
CREATININE: 1.15 mg/dL (ref 0.40–1.50)
GFR: 81.15 mL/min (ref >60.00)
Glucose, Bld: 103 mg/dL — ABNORMAL HIGH (ref 70–99)
Potassium: 4.6 mEq/L (ref 3.5–5.1)
Sodium: 135 mEq/L (ref 135–145)
TOTAL PROTEIN: 7.7 g/dL (ref 6.0–8.3)
Total Bilirubin: 0.4 mg/dL (ref 0.2–1.2)

## 2015-04-18 LAB — URINALYSIS, ROUTINE W REFLEX MICROSCOPIC
Bilirubin Urine: NEGATIVE
HGB URINE DIPSTICK: NEGATIVE
Ketones, ur: NEGATIVE
LEUKOCYTES UA: NEGATIVE
Nitrite: NEGATIVE
RBC / HPF: NONE SEEN (ref 0–?)
SPECIFIC GRAVITY, URINE: 1.015 (ref 1.000–1.030)
Total Protein, Urine: NEGATIVE
URINE GLUCOSE: NEGATIVE
Urobilinogen, UA: 0.2 (ref 0.0–1.0)
pH: 7.5 (ref 5.0–8.0)

## 2015-04-18 LAB — LIPID PANEL
CHOLESTEROL: 119 mg/dL (ref 0–200)
HDL: 37.4 mg/dL — AB (ref >39.00)
LDL CALC: 63 mg/dL (ref 0–99)
NonHDL: 81.94
TRIGLYCERIDES: 97 mg/dL (ref 0.0–149.0)
Total CHOL/HDL Ratio: 3
VLDL: 19.4 mg/dL (ref 0.0–40.0)

## 2015-04-18 LAB — CBC
HCT: 42.2 % (ref 39.0–52.0)
Hemoglobin: 13.5 g/dL (ref 13.0–17.0)
MCHC: 32 g/dL (ref 30.0–36.0)
MCV: 91.3 fl (ref 78.0–100.0)
PLATELETS: 233 10*3/uL (ref 150.0–400.0)
RBC: 4.62 Mil/uL (ref 4.22–5.81)
RDW: 13.3 % (ref 11.5–15.5)
WBC: 6 10*3/uL (ref 4.0–10.5)

## 2015-04-18 LAB — TSH: TSH: 1.32 u[IU]/mL (ref 0.35–4.50)

## 2015-04-18 LAB — HEMOGLOBIN A1C: HEMOGLOBIN A1C: 5.2 % (ref 4.6–6.5)

## 2015-04-18 MED ORDER — CLONIDINE HCL 0.1 MG PO TABS: 0.1000 mg | ORAL_TABLET | Freq: Three times a day (TID) | ORAL | Status: DC

## 2015-04-18 NOTE — Patient Instructions (Signed)
Please go to the lab for blood work. I will call you with your results. Please continue medications as directed. I have refilled your clonidine.   You will be contacted to establish care with a new Urologist but recommend you still follow-up with Dr. Estill Dooms this month.   Preventive Care for Adults, Male A healthy lifestyle and preventive care can promote health and wellness. Preventive health guidelines for men include the following key practices:  A routine yearly physical is a good way to check with your health care provider about your health and preventative screening. It is a chance to share any concerns and updates on your health and to receive a thorough exam.  Visit your dentist for a routine exam and preventative care every 6 months. Brush your teeth twice a day and floss once a day. Good oral hygiene prevents tooth decay and gum disease.  The frequency of eye exams is based on your age, health, family medical history, use of contact lenses, and other factors. Follow your health care provider's recommendations for frequency of eye exams.  Eat a healthy diet. Foods such as vegetables, fruits, whole grains, low-fat dairy products, and lean protein foods contain the nutrients you need without too many calories. Decrease your intake of foods high in solid fats, added sugars, and salt. Eat the right amount of calories for you.Get information about a proper diet from your health care provider, if necessary.  Regular physical exercise is one of the most important things you can do for your health. Most adults should get at least 150 minutes of moderate-intensity exercise (any activity that increases your heart rate and causes you to sweat) each week. In addition, most adults need muscle-strengthening exercises on 2 or more days a week.  Maintain a healthy weight. The body mass index (BMI) is a screening tool to identify possible weight problems. It provides an estimate of body fat based on height  and weight. Your health care provider can find your BMI and can help you achieve or maintain a healthy weight.For adults 20 years and older:  A BMI below 18.5 is considered underweight.  A BMI of 18.5 to 24.9 is normal.  A BMI of 25 to 29.9 is considered overweight.  A BMI of 30 and above is considered obese.  Maintain normal blood lipids and cholesterol levels by exercising and minimizing your intake of saturated fat. Eat a balanced diet with plenty of fruit and vegetables. Blood tests for lipids and cholesterol should begin at age 31 and be repeated every 5 years. If your lipid or cholesterol levels are high, you are over 50, or you are at high risk for heart disease, you may need your cholesterol levels checked more frequently.Ongoing high lipid and cholesterol levels should be treated with medicines if diet and exercise are not working.  If you smoke, find out from your health care provider how to quit. If you do not use tobacco, do not start.  Lung cancer screening is recommended for adults aged 5-80 years who are at high risk for developing lung cancer because of a history of smoking. A yearly low-dose CT scan of the lungs is recommended for people who have at least a 30-pack-year history of smoking and are a current smoker or have quit within the past 15 years. A pack year of smoking is smoking an average of 1 pack of cigarettes a day for 1 year (for example: 1 pack a day for 30 years or 2 packs a day  for 15 years). Yearly screening should continue until the smoker has stopped smoking for at least 15 years. Yearly screening should be stopped for people who develop a health problem that would prevent them from having lung cancer treatment.  If you choose to drink alcohol, do not have more than 2 drinks per day. One drink is considered to be 12 ounces (355 mL) of beer, 5 ounces (148 mL) of wine, or 1.5 ounces (44 mL) of liquor.  Avoid use of street drugs. Do not share needles with anyone.  Ask for help if you need support or instructions about stopping the use of drugs.  High blood pressure causes heart disease and increases the risk of stroke. Your blood pressure should be checked at least every 1-2 years. Ongoing high blood pressure should be treated with medicines, if weight loss and exercise are not effective.  If you are 33-57 years old, ask your health care provider if you should take aspirin to prevent heart disease.  Diabetes screening is done by taking a blood sample to check your blood glucose level after you have not eaten for a certain period of time (fasting). If you are not overweight and you do not have risk factors for diabetes, you should be screened once every 3 years starting at age 80. If you are overweight or obese and you are 38-15 years of age, you should be screened for diabetes every year as part of your cardiovascular risk assessment.  Colorectal cancer can be detected and often prevented. Most routine colorectal cancer screening begins at the age of 78 and continues through age 24. However, your health care provider may recommend screening at an earlier age if you have risk factors for colon cancer. On a yearly basis, your health care provider may provide home test kits to check for hidden blood in the stool. Use of a small camera at the end of a tube to directly examine the colon (sigmoidoscopy or colonoscopy) can detect the earliest forms of colorectal cancer. Talk to your health care provider about this at age 11, when routine screening begins. Direct exam of the colon should be repeated every 5-10 years through age 25, unless early forms of precancerous polyps or small growths are found.  People who are at an increased risk for hepatitis B should be screened for this virus. You are considered at high risk for hepatitis B if:  You were born in a country where hepatitis B occurs often. Talk with your health care provider about which countries are considered  high risk.  Your parents were born in a high-risk country and you have not received a shot to protect against hepatitis B (hepatitis B vaccine).  You have HIV or AIDS.  You use needles to inject street drugs.  You live with, or have sex with, someone who has hepatitis B.  You are a man who has sex with other men (MSM).  You get hemodialysis treatment.  You take certain medicines for conditions such as cancer, organ transplantation, and autoimmune conditions.  Hepatitis C blood testing is recommended for all people born from [birth year] through 1965 and any individual with known risks for hepatitis C.  Practice safe sex. Use condoms and avoid high-risk sexual practices to reduce the spread of sexually transmitted infections (STIs). STIs include gonorrhea, chlamydia, syphilis, trichomonas, herpes, HPV, and human immunodeficiency virus (HIV). Herpes, HIV, and HPV are viral illnesses that have no cure. They can result in disability, cancer, and death.  If you  are a man who has sex with other men, you should be screened at least once per year for:  HIV.  Urethral, rectal, and pharyngeal infection of gonorrhea, chlamydia, or both.  If you are at risk of being infected with HIV, it is recommended that you take a prescription medicine daily to prevent HIV infection. This is called preexposure prophylaxis (PrEP). You are considered at risk if:  You are a man who has sex with other men (MSM) and have other risk factors.  You are a heterosexual man, are sexually active, and are at increased risk for HIV infection.  You take drugs by injection.  You are sexually active with a partner who has HIV.  Talk with your health care provider about whether you are at high risk of being infected with HIV. If you choose to begin PrEP, you should first be tested for HIV. You should then be tested every 3 months for as long as you are taking PrEP.  A one-time screening for abdominal aortic aneurysm (AAA) and  surgical repair of large AAAs by ultrasound are recommended for men ages 56 to 43 years who are current or former smokers.  Healthy men should no longer receive prostate-specific antigen (PSA) blood tests as part of routine cancer screening. Talk with your health care provider about prostate cancer screening.  Testicular cancer screening is not recommended for adult males who have no symptoms. Screening includes self-exam, a health care provider exam, and other screening tests. Consult with your health care provider about any symptoms you have or any concerns you have about testicular cancer.  Use sunscreen. Apply sunscreen liberally and repeatedly throughout the day. You should seek shade when your shadow is shorter than you. Protect yourself by wearing long sleeves, pants, a wide-brimmed hat, and sunglasses year round, whenever you are outdoors.  Once a month, do a whole-body skin exam, using a mirror to look at the skin on your back. Tell your health care provider about new moles, moles that have irregular borders, moles that are larger than a pencil eraser, or moles that have changed in shape or color.  Stay current with required vaccines (immunizations).  Influenza vaccine. All adults should be immunized every year.  Tetanus, diphtheria, and acellular pertussis (Td, Tdap) vaccine. An adult who has not previously received Tdap or who does not know his vaccine status should receive 1 dose of Tdap. This initial dose should be followed by tetanus and diphtheria toxoids (Td) booster doses every 10 years. Adults with an unknown or incomplete history of completing a 3-dose immunization series with Td-containing vaccines should begin or complete a primary immunization series including a Tdap dose. Adults should receive a Td booster every 10 years.  Varicella vaccine. An adult without evidence of immunity to varicella should receive 2 doses or a second dose if he has previously received 1 dose.  Human  papillomavirus (HPV) vaccine. Males aged 11-21 years who have not received the vaccine previously should receive the 3-dose series. Males aged 22-26 years may be immunized. Immunization is recommended through the age of 57 years for any male who has sex with males and did not get any or all doses earlier. Immunization is recommended for any person with an immunocompromised condition through the age of 26 years if he did not get any or all doses earlier. During the 3-dose series, the second dose should be obtained 4-8 weeks after the first dose. The third dose should be obtained 24 weeks after the first  dose and 16 weeks after the second dose.  Zoster vaccine. One dose is recommended for adults aged 78 years or older unless certain conditions are present.  Measles, mumps, and rubella (MMR) vaccine. Adults born before [birth year] generally are considered immune to measles and mumps. Adults born in [birth year] or later should have 1 or more doses of MMR vaccine unless there is a contraindication to the vaccine or there is laboratory evidence of immunity to each of the three diseases. A routine second dose of MMR vaccine should be obtained at least 28 days after the first dose for students attending postsecondary schools, health care workers, or international travelers. People who received inactivated measles vaccine or an unknown type of measles vaccine during 1963-1967 should receive 2 doses of MMR vaccine. People who received inactivated mumps vaccine or an unknown type of mumps vaccine before 1979 and are at high risk for mumps infection should consider immunization with 2 doses of MMR vaccine. Unvaccinated health care workers born before [birth year] who lack laboratory evidence of measles, mumps, or rubella immunity or laboratory confirmation of disease should consider measles and mumps immunization with 2 doses of MMR vaccine or rubella immunization with 1 dose of MMR vaccine.  Pneumococcal 13-valent conjugate (PCV13) vaccine.  When indicated, a person who is uncertain of his immunization history and has no record of immunization should receive the PCV13 vaccine. All adults 55 years of age and older should receive this vaccine. An adult aged 1 years or older who has certain medical conditions and has not been previously immunized should receive 1 dose of PCV13 vaccine. This PCV13 should be followed with a dose of pneumococcal polysaccharide (PPSV23) vaccine. Adults who are at high risk for pneumococcal disease should obtain the PPSV23 vaccine at least 8 weeks after the dose of PCV13 vaccine. Adults older than 69 years of age who have normal immune system function should obtain the PPSV23 vaccine dose at least 1 year after the dose of PCV13 vaccine.  Pneumococcal polysaccharide (PPSV23) vaccine. When PCV13 is also indicated, PCV13 should be obtained first. All adults aged 4 years and older should be immunized. An adult younger than age 23 years who has certain medical conditions should be immunized. Any person who resides in a nursing home or long-term care facility should be immunized. An adult smoker should be immunized. People with an immunocompromised condition and certain other conditions should receive both PCV13 and PPSV23 vaccines. People with human immunodeficiency virus (HIV) infection should be immunized as soon as possible after diagnosis. Immunization during chemotherapy or radiation therapy should be avoided. Routine use of PPSV23 vaccine is not recommended for American Indians, Blue Natives, or people younger than 65 years unless there are medical conditions that require PPSV23 vaccine. When indicated, people who have unknown immunization and have no record of immunization should receive PPSV23 vaccine. One-time revaccination 5 years after the first dose of PPSV23 is recommended for people aged 19-64 years who have chronic kidney failure, nephrotic syndrome, asplenia, or immunocompromised conditions. People who received  1-2 doses of PPSV23 before age 7 years should receive another dose of PPSV23 vaccine at age 20 years or later if at least 5 years have passed since the previous dose. Doses of PPSV23 are not needed for people immunized with PPSV23 at or after age 88 years.  Meningococcal vaccine. Adults with asplenia or persistent complement component deficiencies should receive 2 doses of quadrivalent meningococcal conjugate (MenACWY-D) vaccine. The doses should be obtained at least 2 months apart. Microbiologists  working with certain meningococcal bacteria, military recruits, people at risk during an outbreak, and people who travel to or live in countries with a high rate of meningitis should be immunized. A first-year college student up through age 31 years who is living in a residence hall should receive a dose if he did not receive a dose on or after his 16th birthday. Adults who have certain high-risk conditions should receive one or more doses of vaccine.  Hepatitis A vaccine. Adults who wish to be protected from this disease, have chronic liver disease, work with hepatitis A-infected animals, work in hepatitis A research labs, or travel to or work in countries with a high rate of hepatitis A should be immunized. Adults who were previously unvaccinated and who anticipate close contact with an international adoptee during the first 60 days after arrival in the [place] from a country with a high rate of hepatitis A should be immunized.  Hepatitis B vaccine. Adults should be immunized if they wish to be protected from this disease, are under age 24 years and have diabetes, have chronic liver disease, have had more than one sex partner in the past 6 months, may be exposed to blood or other infectious body fluids, are household contacts or sex partners of hepatitis B positive people, are clients or workers in certain care facilities, or travel to or work in countries with a high rate of hepatitis B.  Haemophilus  influenzae type b (Hib) vaccine. A previously unvaccinated person with asplenia or sickle cell disease or having a scheduled splenectomy should receive 1 dose of Hib vaccine. Regardless of previous immunization, a recipient of a hematopoietic stem cell transplant should receive a 3-dose series 6-12 months after his successful transplant. Hib vaccine is not recommended for adults with HIV infection. Preventive Service / Frequency Ages 55 to 31  Blood pressure check.** / Every 3-5 years.  Lipid and cholesterol check.** / Every 5 years beginning at age 72.  Hepatitis C blood test.** / For any individual with known risks for hepatitis C.  Skin self-exam. / Monthly.  Influenza vaccine. / Every year.  Tetanus, diphtheria, and acellular pertussis (Tdap, Td) vaccine.** / Consult your health care provider. 1 dose of Td every 10 years.  Varicella vaccine.** / Consult your health care provider.  HPV vaccine. / 3 doses over 6 months, if 26 or younger.  Measles, mumps, rubella (MMR) vaccine.** / You need at least 1 dose of MMR if you were born in 1957 or later. You may also need a second dose.  Pneumococcal 13-valent conjugate (PCV13) vaccine.** / Consult your health care provider.  Pneumococcal polysaccharide (PPSV23) vaccine.** / 1 to 2 doses if you smoke cigarettes or if you have certain conditions.  Meningococcal vaccine.** / 1 dose if you are age 58 to 98 years and a Orthoptist living in a residence hall, or have one of several medical conditions. You may also need additional booster doses.  Hepatitis A vaccine.** / Consult your health care provider.  Hepatitis B vaccine.** / Consult your health care provider.  Haemophilus influenzae type b (Hib) vaccine.** / Consult your health care provider. Ages 48 to 29  Blood pressure check.** / Every year.  Lipid and cholesterol check.** / Every 5 years beginning at age 56.  Lung cancer screening. / Every year if you are aged  55-80 years and have a 30-pack-year history of smoking and currently smoke or have quit within the past 15 years. Yearly screening is stopped  once you have quit smoking for at least 15 years or develop a health problem that would prevent you from having lung cancer treatment.  Fecal occult blood test (FOBT) of stool. / Every year beginning at age 81 and continuing until age 74. You may not have to do this test if you get a colonoscopy every 10 years.  Flexible sigmoidoscopy** or colonoscopy.** / Every 5 years for a flexible sigmoidoscopy or every 10 years for a colonoscopy beginning at age 6 and continuing until age 79.  Hepatitis C blood test.** / For all people born from [birth year] through 1965 and any individual with known risks for hepatitis C.  Skin self-exam. / Monthly.  Influenza vaccine. / Every year.  Tetanus, diphtheria, and acellular pertussis (Tdap/Td) vaccine.** / Consult your health care provider. 1 dose of Td every 10 years.  Varicella vaccine.** / Consult your health care provider.  Zoster vaccine.** / 1 dose for adults aged 64 years or older.  Measles, mumps, rubella (MMR) vaccine.** / You need at least 1 dose of MMR if you were born in 1956 or later. You may also need a second dose.  Pneumococcal 13-valent conjugate (PCV13) vaccine.** / Consult your health care provider.  Pneumococcal polysaccharide (PPSV23) vaccine.** / 1 to 2 doses if you smoke cigarettes or if you have certain conditions.  Meningococcal vaccine.** / Consult your health care provider.  Hepatitis A vaccine.** / Consult your health care provider.  Hepatitis B vaccine.** / Consult your health care provider.  Haemophilus influenzae type b (Hib) vaccine.** / Consult your health care provider. Ages 4 and over  Blood pressure check.** / Every year.  Lipid and cholesterol check.**/ Every 5 years beginning at age 74.  Lung cancer screening. / Every year if you are aged 55-80 years and have a 30-pack-year  history of smoking and currently smoke or have quit within the past 15 years. Yearly screening is stopped once you have quit smoking for at least 15 years or develop a health problem that would prevent you from having lung cancer treatment.  Fecal occult blood test (FOBT) of stool. / Every year beginning at age 54 and continuing until age 61. You may not have to do this test if you get a colonoscopy every 10 years.  Flexible sigmoidoscopy** or colonoscopy.** / Every 5 years for a flexible sigmoidoscopy or every 10 years for a colonoscopy beginning at age 41 and continuing until age 15.  Hepatitis C blood test.** / For all people born from [birth year] through 1964 and any individual with known risks for hepatitis C.  Abdominal aortic aneurysm (AAA) screening.** / A one-time screening for ages 43 to 73 years who are current or former smokers.  Skin self-exam. / Monthly.  Influenza vaccine. / Every year.  Tetanus, diphtheria, and acellular pertussis (Tdap/Td) vaccine.** / 1 dose of Td every 10 years.  Varicella vaccine.** / Consult your health care provider.  Zoster vaccine.** / 1 dose for adults aged 38 years or older.  Pneumococcal 13-valent conjugate (PCV13) vaccine.** / 1 dose for all adults aged 52 years and older.  Pneumococcal polysaccharide (PPSV23) vaccine.** / 1 dose for all adults aged 25 years and older.  Meningococcal vaccine.** / Consult your health care provider.  Hepatitis A vaccine.** / Consult your health care provider.  Hepatitis B vaccine.** / Consult your health care provider.  Haemophilus influenzae type b (Hib) vaccine.** / Consult your health care provider. **Family history and personal history of risk and conditions may change your health  care provider's recommendations.   This information is not intended to replace advice given to you by your health care provider. Make sure you discuss any questions you have with your health care provider.   Document Released:  05/11/2001 Document Revised: 04/05/2014 Document Reviewed: 08/10/2010 Elsevier Interactive Patient Education [employer].

## 2015-04-18 NOTE — Progress Notes (Signed)
Pre visit review using our clinic review tool, if applicable. No additional management support is needed unless otherwise documented below in the visit note. 

## 2015-04-18 NOTE — Progress Notes (Signed)
Subjective:    Ronald [name] is a 70 y.o. male who presents for Medicare Annual/Subsequent preventive examination and annual physical exam through [facility]  Tobacco History  Smoking status  . Never Smoker   Smokeless tobacco  . Never Used   Problems Prior to Visit Hypertension -- Patient currently on regimen of HCTZ 25 mg daily, lisinopril 30 mg daily, metoprolol 50 mg BID. Endorses taking as directed. Patient denies chest pain, palpitations, lightheadedness, dizziness, vision changes or frequent headaches.  BP Readings from Last 3 Encounters:  04/18/15 118/62  10/16/14 132/82  10/02/14 186/70   Hyperlipidemia -- Endorses taking simvastatin 40 mg daily as directed without myalgias or arthralgias.  Hyperglycemia -- Noted on previous blood work. Patient denies polyuria, polydipsia or polyphagia.   Bipolar Disorder -- Currently on Abilify 10 mg daily. Endorses stable mood with this medication regimen. Denies breakthrough symptoms or manic episode.   Current Problems (verified) Patient Active Problem List   Diagnosis Date Noted  . Essential hypertension 01/16/2014  . Elevated hemoglobin A1c 09/16/2013  . Bipolar 1 disorder ([place]) 08/02/2013  . Need for prophylactic vaccination against Streptococcus pneumoniae (pneumococcus) 08/02/2013  . GERD (gastroesophageal reflux disease) 07/04/2013  . Elevated PSA, less than 10 ng/ml 07/04/2013  . BPH with elevated PSA 02/28/2013  . Encounter for Medicare annual wellness exam 02/28/2013  . Hyperlipidemia 02/28/2013  . Need for prophylactic vaccination and inoculation against influenza 02/28/2013    Medications Prior to Visit Current Outpatient Prescriptions on File Prior to Visit  Medication Sig Dispense Refill  . ARIPiprazole (ABILIFY) 10 MG tablet TAKE 1 TABLET BY MOUTH EVERY DAY 90 tablet 1  . Calcium-Vitamin D 600-200 MG-UNIT per tablet Take 1 tablet by mouth daily.    .  finasteride (PROSCAR) 5 MG tablet Take 5 mg by mouth daily.    Marland Kitchen KLOR-CON M20 20 MEQ tablet TAKE 1 TABLET (20 MEQ TOTAL) BY MOUTH DAILY. 90 tablet 1   No current facility-administered medications on file prior to visit.    Current Medications (verified) Current Outpatient Prescriptions  Medication Sig Dispense Refill  . ARIPiprazole (ABILIFY) 10 MG tablet TAKE 1 TABLET BY MOUTH EVERY DAY 90 tablet 1  . Calcium-Vitamin D 600-200 MG-UNIT per tablet Take 1 tablet by mouth daily.    . cloNIDine (CATAPRES) 0.1 MG tablet Take 1 tablet (0.1 mg total) by mouth 3 (three) times daily. 90 tablet 3  . finasteride (PROSCAR) 5 MG tablet Take 5 mg by mouth daily.    Marland Kitchen KLOR-CON M20 20 MEQ tablet TAKE 1 TABLET (20 MEQ TOTAL) BY MOUTH DAILY. 90 tablet 1  . hydrochlorothiazide (HYDRODIURIL) 25 MG tablet TAKE 1 TABLET BY MOUTH TWICE A DAY 180 tablet 0  . lisinopril (PRINIVIL,ZESTRIL) 30 MG tablet TAKE 1 TABLET BY MOUTH EVERY DAY 90 tablet 1  . metoprolol (LOPRESSOR) 50 MG tablet TAKE 1 TABLET BY MOUTH TWICE A DAY 180 tablet 1  . ranitidine (ZANTAC) 150 MG capsule TAKE ONE CAPSULE BY MOUTH TWICE A DAY 180 capsule 1  . simvastatin (ZOCOR) 40 MG tablet TAKE 1 TABLET BY MOUTH EVERY EVENING 90 tablet 1   No current facility-administered medications for this visit.    Allergies (verified) Review of patient's allergies indicates no known allergies.   PAST HISTORY  Family History Family History  Problem Relation Age of Onset  . Hypertension Father   . Hyperlipidemia Father   . Congestive Heart Failure Father 22    Deceased-1995  .  Diabetes Mother   . Kidney failure Mother 22    Deceased-2005  . Hypertension Mother   . Congestive Heart Failure Maternal Grandmother   . Heart failure Paternal Grandmother   . Heart attack Paternal Grandmother     Social History Social History  Substance Use Topics  . Smoking status: Never Smoker   . Smokeless tobacco: Never Used  . Alcohol Use: No   Are there  smokers in +your home (other than you)?  No  Risk Factors Current exercise habits: Home exercise routine includes walking 1 hrs per day and works in his garden.  Dietary issues discussed: Body mass index is 36.42 kg/(m^2). Endorses well-balanced diet overall. Does snack.   Cardiac risk factors: advanced age (older than 13 for men, 39 for women), dyslipidemia, family history of premature cardiovascular disease, hypertension, male gender and obesity (BMI >= 30 kg/m2).  Depression Screen (Note: if answer to either of the following is "Yes", a more complete depression screening is indicated)   Q1: Over the past two weeks, have you felt down, depressed or hopeless? No  Q2: Over the past two weeks, have you felt little interest or pleasure in doing things? No  Have you lost interest or pleasure in daily life? No  Do you often feel hopeless? No  Do you cry easily over simple problems? No  Activities of Daily Living In your present state of health, do you have any difficulty performing the following activities?:  Driving? Yes -- does not drive. Mana2ging money?  No Feeding yourself? No Getting from bed to chair? No Climbing a flight of stairs? No Preparing food and eating?: No Bathing or showering? No Getting dressed: No Getting to the toilet? No Using the toilet:No Moving around from place to place: No In the past year have you fallen or had a near fall?:No   Are you sexually active?  No  Do you have more than one partner?  N/A  Hearing Difficulties: No Do you often ask people to speak up or repeat themselves? No Do you experience ringing or noises in your ears? No Do you have difficulty understanding soft or whispered voices? No   Do you feel that you have a problem with memory? No  Do you often misplace items? No  Do you feel safe at home?  Yes  Cognitive Testing  Alert? Yes  Normal Appearance?Yes  Oriented to person? Yes  Place? Yes   Time? Yes  Recall of three objects?   Yes  Can perform simple calculations? Yes  Displays appropriate judgment?Yes    Advanced Directives have been discussed with the patient? Yes   List the Names of Other Physician/Practitioners you currently use:  See EMR for care team.  Indicate any recent Medical Services you may have received from other than [facility] providers in the past year (date may be approximate).  Immunization History  Administered Date(s) Administered  . Influenza, High Dose Seasonal PF 02/26/2013  . Influenza,inj,Quad PF,36+ Mos 04/18/2015  . Pneumococcal Conjugate-13 08/02/2013  . Pneumococcal Polysaccharide-23 10/16/2014  . Td 08/23/2013  . Tdap 03/29/1997    Screening Tests Health Maintenance  Topic Date Due  . Hepatitis C Screening  [birth year]  . ZOSTAVAX  07/17/2006  . INFLUENZA VACCINE  10/28/2015  . COLONOSCOPY  05/21/2018  . DTaP/Tdap/Td (3 - Td) 08/24/2023  . TETANUS/TDAP  08/24/2023  . PNA vac Low Risk Adult  Completed    All answers were reviewed with the patient and necessary referrals were made:  Leeanne Rio, PA-C   04/27/2015   History reviewed: allergies, current medications, past family history, past medical history, past social history, past surgical history and problem list  Review of Systems Pertinent items noted in HPI and remainder of comprehensive ROS otherwise negative.    Objective:      Blood pressure 118/62, pulse 53, temperature 95.3 F (35.2 C), temperature source Tympanic, height 5\' 7"  (1.702 m), weight 232 lb 9.6 oz (105.507 kg), SpO2 99 %. Body mass index is 36.42 kg/(m^2).  General appearance: alert, cooperative, appears stated age and no distress Head: Normocephalic, without obvious abnormality, atraumatic Eyes: conjunctivae/corneas clear. PERRL, EOM's intact. Fundi benign. Ears: normal TM's and external ear canals both ears Nose: Nares normal. Septum midline. Mucosa normal. No drainage or sinus tenderness. Throat: lips, mucosa, and tongue normal;  teeth and gums normal Lungs: clear to auscultation bilaterally Heart: regular rate and rhythm, S1, S2 normal, no murmur, click, rub or gallop Abdomen: soft, non-tender; bowel sounds normal; no masses,  no organomegaly Extremities: extremities normal, atraumatic, no cyanosis or edema Pulses: 2+ and symmetric     Assessment:     (1) Medicare Wellness, Subsequent (2) Annual Physical Examination (3) Hypertension (4) Hyperlipidemia (5) Hyperglycemia (6) Bipolar Disorder     Plan:     (1) During the course of the visit the patient was educated and counseled about appropriate screening and preventive services including:    Influenza vaccine  Screening electrocardiogram  Prostate cancer screening  Diabetes screening  Nutrition counseling   Patient Instructions (the written plan) was given to the patient.  (2) Depression screen negative. Health Maintenance reviewed -- Flu shot updated. Colonoscopy, TDaP, Pneumonia all up-to-date. Is followed by Urology who monitors PSA.Marland Kitchen Preventive schedule discussed and handout given in AVS. Will obtain fasting labs today.  (3) Well-controlled. Asymptomatic. Continue current regimen. Will check BMP.  (4) Tolerating statin well. Will check lipid panel today. Continue ASA.   (5) Will repeat BMP and check and A1C today.  (6) Mood has been stable for some time. Continue current regimen.  Medicare Attestation I have personally reviewed: The patient's medical and social history Their use of alcohol, tobacco or illicit drugs Their current medications and supplements The patient's functional ability including ADLs,fall risks, home safety risks, cognitive, and hearing and visual impairment Diet and physical activities Evidence for depression or mood disorders  The patient's weight, height, BMI, and visual acuity have been recorded in the chart.  I have made referrals, counseling, and provided education to the patient based on review of the  above and I have provided the patient with a written personalized care plan for preventive services.     Raiford Noble [place], [place]   04/27/2015

## 2015-04-24 ENCOUNTER — Encounter: Payer: Self-pay | Admitting: Physician Assistant

## 2015-07-27 ENCOUNTER — Other Ambulatory Visit: Payer: Self-pay | Admitting: Physician Assistant

## 2015-08-21 ENCOUNTER — Other Ambulatory Visit: Payer: Self-pay | Admitting: Physician Assistant

## 2015-10-21 ENCOUNTER — Encounter: Payer: Self-pay | Admitting: Physician Assistant

## 2015-10-21 ENCOUNTER — Ambulatory Visit (INDEPENDENT_AMBULATORY_CARE_PROVIDER_SITE_OTHER): Payer: Medicare Other | Admitting: Physician Assistant

## 2015-10-21 VITALS — BP 118/70 | HR 66 | Temp 98.0°F | Resp 20 | Ht 67.0 in | Wt 232.4 lb

## 2015-10-21 DIAGNOSIS — J302 Other seasonal allergic rhinitis: Secondary | ICD-10-CM

## 2015-10-21 DIAGNOSIS — F319 Bipolar disorder, unspecified: Secondary | ICD-10-CM

## 2015-10-21 DIAGNOSIS — I10 Essential (primary) hypertension: Secondary | ICD-10-CM

## 2015-10-21 DIAGNOSIS — C61 Malignant neoplasm of prostate: Secondary | ICD-10-CM | POA: Diagnosis not present

## 2015-10-21 MED ORDER — METOPROLOL TARTRATE 50 MG PO TABS: 50.0000 mg | ORAL_TABLET | Freq: Two times a day (BID) | ORAL | 1 refills | Status: DC

## 2015-10-21 MED ORDER — LISINOPRIL 30 MG PO TABS: 30.0000 mg | ORAL_TABLET | Freq: Every day | ORAL | 1 refills | Status: DC

## 2015-10-21 MED ORDER — FLUTICASONE PROPIONATE 50 MCG/ACT NA SUSP: 2.0000 | Freq: Every day | NASAL | 6 refills | Status: DC

## 2015-10-21 MED ORDER — CLONIDINE HCL 0.1 MG PO TABS: 0.1000 mg | ORAL_TABLET | Freq: Three times a day (TID) | ORAL | 5 refills | Status: DC

## 2015-10-21 MED ORDER — HYDROCHLOROTHIAZIDE 25 MG PO TABS: 25.0000 mg | ORAL_TABLET | Freq: Two times a day (BID) | ORAL | 0 refills | Status: DC

## 2015-10-21 MED ORDER — SIMVASTATIN 40 MG PO TABS: 40.0000 mg | ORAL_TABLET | Freq: Every evening | ORAL | 1 refills | Status: DC

## 2015-10-21 NOTE — Patient Instructions (Signed)
Please go to the lab for blood work. I will call you with your results.  Please continue chronic medications as directed. Stay active and exercise.  Start the Flonase as directed for nasal allergy symptoms.  Follow-up with me in 6 months for an annual exam.

## 2015-10-21 NOTE — Assessment & Plan Note (Signed)
Will avoid use of antihistamines for this due to prostate enlargement and cancer. Rx Flonase to use as directed. FU if symptoms are not improving.

## 2015-10-21 NOTE — Progress Notes (Signed)
Patient presents to clinic today for follow-up regarding chronic medical issues.  Hypertension -- Endorses taking medications as directed. Has been well-controlled recently. Patient denies chest pain, palpitations, lightheadedness, dizziness, vision changes or frequent headaches.  BP Readings from Last 3 Encounters:  10/21/15 118/70  04/18/15 118/62  10/16/14 132/82   Bipolar Disorder -- Is currently on Abilify 10 mg daily. Has done extremely well on this regimen for some time. Denies breakthrough depressed mood or anhedonia. Denies anxiety or manic episodes. Loves to work in his garden. Has been mindful of sun and heat. Is happy that he is able to get out and go.   Prostate Cancer -- Followed by Urology, Dr. Estill [name]. Is doing very well. Is currently under radiation therapy and Lupron injections. Last appointment was 10/17/15. Patient scheduled for next FU in 6 months.  Patient also wishes to discuss treatment for nasal allergy symptoms. Endorses runny nose and sneezing when working outside. Denies fever, chills, cough or chest congestion.  Past Medical History:  Diagnosis Date  . Chicken pox   . Dementia   . Depressed   . Hyperlipemia   . Hypertension   . Measles   . Mumps   . 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 ARIPiprazole (ABILIFY) 10 MG tablet TAKE 1 TABLET BY MOUTH EVERY DAY 90 tablet 1  . Calcium-Vitamin D 600-200 MG-UNIT per tablet Take 1 tablet by mouth daily.    . cloNIDine (CATAPRES) 0.1 MG tablet Take 1 tablet (0.1 mg total) by mouth 3 (three) times daily. 90 tablet 3  . finasteride (PROSCAR) 5 MG tablet Take 5 mg by mouth daily.    . hydrochlorothiazide (HYDRODIURIL) 25 MG tablet Take 1 tablet (25 mg total) by mouth 2 (two) times daily. 180 tablet 0  . KLOR-CON M20 20 MEQ tablet TAKE 1 TABLET (20 MEQ TOTAL) BY MOUTH DAILY. 90 tablet 1  . lisinopril  (PRINIVIL,ZESTRIL) 30 MG tablet TAKE 1 TABLET BY MOUTH EVERY DAY 90 tablet 1  . metoprolol (LOPRESSOR) 50 MG tablet TAKE 1 TABLET BY MOUTH TWICE A DAY 180 tablet 1  . ranitidine (ZANTAC) 150 MG capsule TAKE ONE CAPSULE BY MOUTH TWICE A DAY 180 capsule 1  . simvastatin (ZOCOR) 40 MG tablet TAKE 1 TABLET BY MOUTH EVERY EVENING 90 tablet 1   No current facility-administered medications on file prior to visit.     Allergies  Allergen Reactions  . Dust Mite Extract Other (See Comments)    Allergy symptoms   . Pollen Extract Other (See Comments)    Allergy symptoms   . Rye Grass Flower Pollen Extract [Gramineae Pollens] Other (See Comments)    Allergy Symptoms     Family History  Problem Relation Age of Onset  . Hypertension Father   . Hyperlipidemia Father   . Congestive Heart Failure Father 10    Deceased-1995  . Diabetes Mother   . Kidney failure Mother 36    Deceased-2005  . Hypertension Mother   . Congestive Heart Failure Maternal Grandmother   . Heart failure Paternal Grandmother   . Heart attack Paternal Grandmother     Social History   Social History  . Marital status: Single    Spouse name: N/A  . Number of children: N/A  . Years of education: N/A   Social History Main Topics  . Smoking status: Never Smoker  . Smokeless tobacco: Never  Used  . Alcohol use No  . Drug use: No  . Sexual activity: No   Other Topics Concern  . None   Social History Narrative  . None   Review of Systems - See HPI.  All other ROS are negative.  BP 118/70 (BP Location: Left Arm, Patient Position: Sitting, Cuff Size: Large)   Pulse 66   Temp 98 F (36.7 C) (Oral)   Resp 20   Ht 5\' 7"  (1.702 m)   Wt 232 lb 6 oz (105.4 kg)   SpO2 98%   BMI 36.40 kg/m   Physical Exam  Constitutional: He is oriented to person, place, and time and well-developed, well-nourished, and in no distress.  HENT:  Head: Normocephalic and atraumatic.  Nose: Rhinorrhea present.  Eyes: Conjunctivae  are normal.  Neck: Neck supple.  Cardiovascular: Normal rate, regular rhythm, normal heart sounds and intact distal pulses.   Pulmonary/Chest: Effort normal and breath sounds normal. No respiratory distress. He has no wheezes. He has no rales. He exhibits no tenderness.  Neurological: He is alert and oriented to person, place, and time.  Skin: Skin is warm and dry. No rash noted.  Psychiatric: Affect normal.  Vitals reviewed.  Assessment/Plan: Essential hypertension BP well-controlled. Asymptomatic. Will check CMP today. Continue current medication regimen.  Other seasonal allergic rhinitis Will avoid use of antihistamines for this due to prostate enlargement and cancer. Rx Flonase to use as directed. FU if symptoms are not improving.  Prostate cancer [facility]) Followed by Urology. Improving. Is completing radiation therapy and Lupron injections. FU scheduled for 6 months with Urology. No complaints today.  Bipolar 1 disorder Mood has been stable for quite some time on Abilify. Patient compliant with medications. Continue care as directed. FU 6 months.    Leeanne Rio, PA-C

## 2015-10-21 NOTE — Assessment & Plan Note (Signed)
Followed by Urology. Improving. Is completing radiation therapy and Lupron injections. FU scheduled for 6 months with Urology. No complaints today.

## 2015-10-21 NOTE — Assessment & Plan Note (Signed)
Mood has been stable for quite some time on Abilify. Patient compliant with medications. Continue care as directed. FU 6 months.

## 2015-10-21 NOTE — Assessment & Plan Note (Signed)
BP well-controlled. Asymptomatic. Will check CMP today. Continue current medication regimen.

## 2015-10-22 LAB — COMPREHENSIVE METABOLIC PANEL
ALBUMIN: 4 g/dL (ref 3.5–5.2)
ALK PHOS: 82 U/L (ref 39–117)
ALT: 24 U/L (ref 0–53)
AST: 17 U/L (ref 0–37)
BILIRUBIN TOTAL: 0.3 mg/dL (ref 0.2–1.2)
BUN: 28 mg/dL — ABNORMAL HIGH (ref 6–23)
CALCIUM: 9.8 mg/dL (ref 8.4–10.5)
CO2: 31 meq/L (ref 19–32)
CREATININE: 1.02 mg/dL (ref 0.40–1.50)
Chloride: 98 mEq/L (ref 96–112)
GFR: 93.06 mL/min (ref >60.00)
Glucose, Bld: 77 mg/dL (ref 70–99)
Potassium: 4 mEq/L (ref 3.5–5.1)
Sodium: 135 mEq/L (ref 135–145)
TOTAL PROTEIN: 7.9 g/dL (ref 6.0–8.3)

## 2015-10-30 ENCOUNTER — Other Ambulatory Visit: Payer: Self-pay | Admitting: Physician Assistant

## 2015-10-31 ENCOUNTER — Other Ambulatory Visit: Payer: Self-pay | Admitting: *Deleted

## 2015-10-31 MED ORDER — POTASSIUM CHLORIDE CRYS ER 20 MEQ PO TBCR: EXTENDED_RELEASE_TABLET | ORAL | 1 refills | Status: DC

## 2015-10-31 NOTE — Telephone Encounter (Signed)
Rx sent to the pharmacy by e-script,and was also phoned to the pharmacy.//AB/CMA

## 2015-10-31 NOTE — Telephone Encounter (Signed)
Error//AB/CMA 

## 2015-10-31 NOTE — Telephone Encounter (Signed)
Rx requested Denied, already sent 90-day supply with refill on 10/21/15; too soon/SLS 08/04

## 2016-01-14 ENCOUNTER — Encounter (INDEPENDENT_AMBULATORY_CARE_PROVIDER_SITE_OTHER): Payer: Medicare Other | Admitting: Podiatry

## 2016-01-15 ENCOUNTER — Other Ambulatory Visit: Payer: Self-pay | Admitting: Physician Assistant

## 2016-01-15 DIAGNOSIS — I10 Essential (primary) hypertension: Secondary | ICD-10-CM

## 2016-01-15 NOTE — Progress Notes (Signed)
Patient ID: Ronald [name], male   DOB: 1945-08-28, 70 y.o.   MRN: [id number]   No show

## 2016-04-14 ENCOUNTER — Other Ambulatory Visit: Payer: Self-pay | Admitting: Physician Assistant

## 2016-04-14 DIAGNOSIS — I10 Essential (primary) hypertension: Secondary | ICD-10-CM

## 2016-04-26 ENCOUNTER — Encounter: Payer: Medicare Other | Admitting: Physician Assistant

## 2016-05-12 ENCOUNTER — Other Ambulatory Visit: Payer: Self-pay | Admitting: Physician Assistant

## 2016-05-26 ENCOUNTER — Other Ambulatory Visit: Payer: Self-pay | Admitting: Physician Assistant

## 2016-05-26 DIAGNOSIS — I10 Essential (primary) hypertension: Secondary | ICD-10-CM

## 2016-05-26 NOTE — Telephone Encounter (Signed)
Spoke with patient about scheduling and CPE since last visit was 10/21/15. He states he is not out of Clonidine, he just picked up rx. He is transferring to [facility].

## 2016-05-27 ENCOUNTER — Other Ambulatory Visit: Payer: Self-pay | Admitting: Physician Assistant

## 2016-05-27 DIAGNOSIS — I10 Essential (primary) hypertension: Secondary | ICD-10-CM

## 2016-06-03 ENCOUNTER — Encounter: Payer: Medicare Other | Admitting: Physician Assistant

## 2016-06-03 DIAGNOSIS — Z0289 Encounter for other administrative examinations: Secondary | ICD-10-CM

## 2016-07-08 ENCOUNTER — Other Ambulatory Visit: Payer: Self-pay | Admitting: Physician Assistant

## 2016-07-08 DIAGNOSIS — I10 Essential (primary) hypertension: Secondary | ICD-10-CM

## 2016-07-14 ENCOUNTER — Encounter (HOSPITAL_BASED_OUTPATIENT_CLINIC_OR_DEPARTMENT_OTHER): Payer: Self-pay | Admitting: Emergency Medicine

## 2016-07-14 ENCOUNTER — Emergency Department (HOSPITAL_BASED_OUTPATIENT_CLINIC_OR_DEPARTMENT_OTHER): Payer: Medicare Other

## 2016-07-14 ENCOUNTER — Inpatient Hospital Stay (HOSPITAL_BASED_OUTPATIENT_CLINIC_OR_DEPARTMENT_OTHER)
Admission: EM | Admit: 2016-07-14 | Discharge: 2016-07-21 | DRG: 193 | Disposition: A | Discharge status: Home / Self Care | Payer: Medicare Other | Attending: Internal Medicine | Admitting: Internal Medicine

## 2016-07-14 DIAGNOSIS — Z7951 Long term (current) use of inhaled steroids: Secondary | ICD-10-CM

## 2016-07-14 DIAGNOSIS — R8290 Unspecified abnormal findings in urine: Secondary | ICD-10-CM | POA: Diagnosis present

## 2016-07-14 DIAGNOSIS — W07XXXA Fall from chair, initial encounter: Secondary | ICD-10-CM | POA: Diagnosis present

## 2016-07-14 DIAGNOSIS — Z91048 Other nonmedicinal substance allergy status: Secondary | ICD-10-CM

## 2016-07-14 DIAGNOSIS — F0390 Unspecified dementia without behavioral disturbance: Secondary | ICD-10-CM | POA: Diagnosis present

## 2016-07-14 DIAGNOSIS — R4781 Slurred speech: Secondary | ICD-10-CM | POA: Diagnosis present

## 2016-07-14 DIAGNOSIS — R29898 Other symptoms and signs involving the musculoskeletal system: Secondary | ICD-10-CM

## 2016-07-14 DIAGNOSIS — I639 Cerebral infarction, unspecified: Secondary | ICD-10-CM | POA: Diagnosis present

## 2016-07-14 DIAGNOSIS — I6330 Cerebral infarction due to thrombosis of unspecified cerebral artery: Secondary | ICD-10-CM

## 2016-07-14 DIAGNOSIS — M79602 Pain in left arm: Secondary | ICD-10-CM | POA: Diagnosis present

## 2016-07-14 DIAGNOSIS — G8194 Hemiplegia, unspecified affecting left nondominant side: Secondary | ICD-10-CM | POA: Diagnosis present

## 2016-07-14 DIAGNOSIS — R778 Other specified abnormalities of plasma proteins: Secondary | ICD-10-CM | POA: Diagnosis present

## 2016-07-14 DIAGNOSIS — R7401 Elevation of levels of liver transaminase levels: Secondary | ICD-10-CM | POA: Diagnosis present

## 2016-07-14 DIAGNOSIS — Z79899 Other long term (current) drug therapy: Secondary | ICD-10-CM

## 2016-07-14 DIAGNOSIS — K219 Gastro-esophageal reflux disease without esophagitis: Secondary | ICD-10-CM | POA: Diagnosis present

## 2016-07-14 DIAGNOSIS — E860 Dehydration: Secondary | ICD-10-CM | POA: Diagnosis present

## 2016-07-14 DIAGNOSIS — M6282 Rhabdomyolysis: Secondary | ICD-10-CM | POA: Diagnosis present

## 2016-07-14 DIAGNOSIS — Z8249 Family history of ischemic heart disease and other diseases of the circulatory system: Secondary | ICD-10-CM

## 2016-07-14 DIAGNOSIS — J189 Pneumonia, unspecified organism: Principal | ICD-10-CM | POA: Diagnosis present

## 2016-07-14 DIAGNOSIS — I6523 Occlusion and stenosis of bilateral carotid arteries: Secondary | ICD-10-CM | POA: Diagnosis present

## 2016-07-14 DIAGNOSIS — N390 Urinary tract infection, site not specified: Secondary | ICD-10-CM | POA: Diagnosis present

## 2016-07-14 DIAGNOSIS — I10 Essential (primary) hypertension: Secondary | ICD-10-CM | POA: Diagnosis present

## 2016-07-14 DIAGNOSIS — J181 Lobar pneumonia, unspecified organism: Secondary | ICD-10-CM

## 2016-07-14 DIAGNOSIS — Z923 Personal history of irradiation: Secondary | ICD-10-CM

## 2016-07-14 DIAGNOSIS — R740 Nonspecific elevation of levels of transaminase and lactic acid dehydrogenase [LDH]: Secondary | ICD-10-CM

## 2016-07-14 DIAGNOSIS — Z6835 Body mass index (BMI) 35.0-35.9, adult: Secondary | ICD-10-CM

## 2016-07-14 DIAGNOSIS — E669 Obesity, unspecified: Secondary | ICD-10-CM | POA: Diagnosis present

## 2016-07-14 DIAGNOSIS — R7989 Other specified abnormal findings of blood chemistry: Secondary | ICD-10-CM

## 2016-07-14 DIAGNOSIS — I63231 Cerebral infarction due to unspecified occlusion or stenosis of right carotid arteries: Secondary | ICD-10-CM

## 2016-07-14 DIAGNOSIS — F319 Bipolar disorder, unspecified: Secondary | ICD-10-CM | POA: Diagnosis present

## 2016-07-14 DIAGNOSIS — M1712 Unilateral primary osteoarthritis, left knee: Secondary | ICD-10-CM | POA: Diagnosis present

## 2016-07-14 DIAGNOSIS — C61 Malignant neoplasm of prostate: Secondary | ICD-10-CM | POA: Diagnosis present

## 2016-07-14 DIAGNOSIS — F209 Schizophrenia, unspecified: Secondary | ICD-10-CM | POA: Diagnosis present

## 2016-07-14 DIAGNOSIS — E785 Hyperlipidemia, unspecified: Secondary | ICD-10-CM | POA: Diagnosis present

## 2016-07-14 DIAGNOSIS — R531 Weakness: Secondary | ICD-10-CM

## 2016-07-14 DIAGNOSIS — M25462 Effusion, left knee: Secondary | ICD-10-CM

## 2016-07-14 HISTORY — DX: Malignant (primary) neoplasm, unspecified: C80.1

## 2016-07-14 LAB — CBC WITH DIFFERENTIAL/PLATELET
BASOS PCT: 0 %
Basophils Absolute: 0 10*3/uL (ref 0.0–0.1)
EOS ABS: 0 10*3/uL (ref 0.0–0.7)
EOS PCT: 0 %
HCT: 37 % — ABNORMAL LOW (ref 39.0–52.0)
Hemoglobin: 11.9 g/dL — ABNORMAL LOW (ref 13.0–17.0)
LYMPHS ABS: 1.2 10*3/uL (ref 0.7–4.0)
Lymphocytes Relative: 13 %
MCH: 29.2 pg (ref 26.0–34.0)
MCHC: 32.2 g/dL (ref 30.0–36.0)
MCV: 90.9 fL (ref 78.0–100.0)
Monocytes Absolute: 0.7 10*3/uL (ref 0.1–1.0)
Monocytes Relative: 8 %
Neutro Abs: 7.5 10*3/uL (ref 1.7–7.7)
Neutrophils Relative %: 79 %
PLATELETS: 276 10*3/uL (ref 150–400)
RBC: 4.07 MIL/uL — AB (ref 4.22–5.81)
RDW: 14.1 % (ref 11.5–15.5)
WBC: 9.4 10*3/uL (ref 4.0–10.5)

## 2016-07-14 LAB — COMPREHENSIVE METABOLIC PANEL
ALK PHOS: 84 U/L (ref 38–126)
ALT: 38 U/L (ref 17–63)
AST: 106 U/L — AB (ref 15–41)
Albumin: 3.7 g/dL (ref 3.5–5.0)
Anion gap: 13 (ref 5–15)
BUN: 21 mg/dL — AB (ref 6–20)
CALCIUM: 9.7 mg/dL (ref 8.9–10.3)
CHLORIDE: 97 mmol/L — AB (ref 101–111)
CO2: 24 mmol/L (ref 22–32)
CREATININE: 1.11 mg/dL (ref 0.61–1.24)
GFR calc Af Amer: >60 mL/min (ref >60)
GFR calc non Af Amer: >60 mL/min (ref >60)
GLUCOSE: 108 mg/dL — AB (ref 65–99)
Potassium: 4.2 mmol/L (ref 3.5–5.1)
SODIUM: 134 mmol/L — AB (ref 135–145)
Total Bilirubin: 0.7 mg/dL (ref 0.3–1.2)
Total Protein: 7.5 g/dL (ref 6.5–8.1)

## 2016-07-14 LAB — URINALYSIS, ROUTINE W REFLEX MICROSCOPIC
Glucose, UA: NEGATIVE mg/dL
HGB URINE DIPSTICK: NEGATIVE
Ketones, ur: >80 mg/dL — AB
NITRITE: NEGATIVE
PROTEIN: NEGATIVE mg/dL
Specific Gravity, Urine: 1.028 (ref 1.005–1.030)
pH: 5.5 (ref 5.0–8.0)

## 2016-07-14 LAB — TROPONIN I: TROPONIN I: 0.03 ng/mL — AB (ref <0.03)

## 2016-07-14 LAB — RAPID URINE DRUG SCREEN, HOSP PERFORMED
AMPHETAMINES: NOT DETECTED
BARBITURATES: NOT DETECTED
BENZODIAZEPINES: NOT DETECTED
Cocaine: NOT DETECTED
Opiates: NOT DETECTED
TETRAHYDROCANNABINOL: NOT DETECTED

## 2016-07-14 LAB — CBG MONITORING, ED: Glucose-Capillary: 105 mg/dL — ABNORMAL HIGH (ref 65–99)

## 2016-07-14 LAB — URINALYSIS, MICROSCOPIC (REFLEX)

## 2016-07-14 LAB — ETHANOL

## 2016-07-14 MED ORDER — DEXTROSE 5 % IV SOLN
500.0000 mg | Freq: Once | INTRAVENOUS | Status: AC
  Administered 2016-07-14: 500 mg via INTRAVENOUS

## 2016-07-14 MED ORDER — AZITHROMYCIN 500 MG IV SOLR
INTRAVENOUS | Status: AC
  Filled 2016-07-14: qty 500

## 2016-07-14 MED ORDER — DEXTROSE 5 % IV SOLN
1.0000 g | Freq: Once | INTRAVENOUS | Status: AC
  Administered 2016-07-14: 1 g via INTRAVENOUS
  Filled 2016-07-14: qty 10

## 2016-07-14 MED ORDER — ASPIRIN 81 MG PO CHEW
324.0000 mg | CHEWABLE_TABLET | Freq: Once | ORAL | Status: AC
  Administered 2016-07-14: 324 mg via ORAL
  Filled 2016-07-14: qty 4

## 2016-07-14 NOTE — ED Notes (Signed)
Iv attempt x 2 unsuccessful

## 2016-07-14 NOTE — ED Notes (Signed)
Patient hooked up to cardiac monitor with vitals set to Q 30 minutes.

## 2016-07-14 NOTE — ED Triage Notes (Signed)
Family member at the bedside states that the patient has had increased dizziness today with increased slurred speech. The patient states that he fell out of his chair x 2 today - he is having pain to his left arm. The patient has decreased grip to his left hand, patient reports that he is unable to stand now. Family member reports a course cough "for a while"

## 2016-07-14 NOTE — ED Notes (Signed)
Patient transported to X-ray 

## 2016-07-14 NOTE — ED Notes (Signed)
ED Provider at bedside. 

## 2016-07-14 NOTE — ED Notes (Signed)
Date and time results received: 07/14/16 2044 (use smartphrase ".now" to insert current time)  Test: troponin Critical Value: 0.03  Name of Provider Notified: Dr. Alvino Chapel  Orders Received? Or Actions Taken?: No new orders

## 2016-07-14 NOTE — Progress Notes (Signed)
Called by [place]  Patient came in with intermittent cough, temp 100.21F.  CXR showed a lobar infiltrate consistent with pneumonia (pictures hazy, given clinical picture likely pneumonia).  Also noted to not be moving his left leg very well, patient states it hurts, but EDP concerned for true weakness.  CT scan of head showed only chronic issues.  Ordered therapy for CAP.  Needs re-eval for neuro status when gets here and MRI if deemed appropriate.  EDP will get hip xray prior to leaving [facility] ED.   Accepted to Telemetry bed (vitals are stable with some HTN.  Does not appear to be septic), inpatient.   Gilles Chiquito, MD

## 2016-07-14 NOTE — ED Provider Notes (Signed)
[facility] DEPT [facility] Provider Note   CSN: [id number] Arrival date & time: 07/14/16  9629  By signing my name below, I, [name], attest that this documentation has been prepared under the direction and in the presence of physician practitioner, Davonna Belling, MD. Electronically Signed: [name], Scribe. 07/14/2016. 7:45 PM.  History   Chief Complaint Chief Complaint  Patient presents with  . Dizziness    The history is provided by the patient and a relative. No language interpreter was used.     HPI Comments: Ronald [name] is a 71 y.o. male with PMHx including dementia, HLD, HTN, and prostate cancer who presents to the Emergency Department for evaluation of persistent dizziness and slurred speech today. Patient is reporting some focal weakness of his left lower extremity and some intermittent fevers today as well. He states he has had gradually worsening pain in his left lower extremity for three months that is severe today. He states he cannot walk currently due to his pain and weakness. Patient has additionally had a persistent cough that is intermittently productive for about one month. He has a h/o prostate cancer and is undergoing treatment. He denies numbness/tingling, facial asymmetry, nausea, vomiting, syncope, or any other associated symptoms.  Past Medical History:  Diagnosis Date  . Chicken pox   . Dementia   . Depressed   . Hyperlipemia   . Hypertension   . Measles   . Mumps   . 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 Prostate cancer ([place]) 10/21/2015  . Other seasonal allergic rhinitis 10/21/2015  . Essential hypertension 01/16/2014  . Bipolar 1 disorder ([place]) 08/02/2013  . GERD (gastroesophageal reflux disease) 07/04/2013  . Elevated PSA, less than 10 ng/ml 07/04/2013  . BPH with elevated PSA 02/28/2013  . Encounter for Medicare annual wellness exam 02/28/2013  .  Hyperlipidemia 02/28/2013    Past Surgical History:  Procedure Laterality Date  . Unremarkable         Home Medications    Prior to Admission medications   Medication Sig Start Date End Date Taking? Authorizing Provider  ARIPiprazole (ABILIFY) 10 MG tablet TAKE 1 TABLET BY MOUTH EVERY DAY 01/16/16   Brunetta Jeans, PA-C  Calcium-Vitamin D 600-200 MG-UNIT per tablet Take 1 tablet by mouth daily.    Historical Provider, MD  cloNIDine (CATAPRES) 0.1 MG tablet Take 1 tablet (0.1 mg total) by mouth 3 (three) times daily. 10/21/15   Brunetta Jeans, PA-C  finasteride (PROSCAR) 5 MG tablet Take 5 mg by mouth daily.    Historical Provider, MD  fluticasone (FLONASE) 50 MCG/ACT nasal spray Place 2 sprays into both nostrils daily. 10/21/15   Brunetta Jeans, PA-C  hydrochlorothiazide (HYDRODIURIL) 25 MG tablet TAKE 1 TABLET (25 MG TOTAL) BY MOUTH 2 (TWO) TIMES DAILY. 01/16/16   Brunetta Jeans, PA-C  lisinopril (PRINIVIL,ZESTRIL) 30 MG tablet TAKE 1 TABLET (30 MG TOTAL) BY MOUTH DAILY. 05/28/16   Brunetta Jeans, PA-C  metoprolol (LOPRESSOR) 50 MG tablet TAKE 1 TABLET BY MOUTH TWICE A DAY 05/28/16   Brunetta Jeans, PA-C  potassium chloride SA (KLOR-CON M20) 20 MEQ tablet TAKE 1 TABLET (20 MEQ TOTAL) BY MOUTH DAILY. 10/31/15   Brunetta Jeans, PA-C  ranitidine (ZANTAC) 150 MG capsule TAKE ONE CAPSULE BY MOUTH TWICE A DAY 04/18/15   Brunetta Jeans, PA-C  simvastatin (ZOCOR) 40 MG tablet TAKE 1 TABLET (40 MG  TOTAL) BY MOUTH EVERY EVENING. 04/16/16   Brunetta Jeans, PA-C    Family History Family History  Problem Relation Age of Onset  . Hypertension Father   . Hyperlipidemia Father   . Congestive Heart Failure Father 55    Deceased-1995  . Diabetes Mother   . Kidney failure Mother 12    Deceased-2005  . Hypertension Mother   . Congestive Heart Failure Maternal Grandmother   . Heart failure Paternal Grandmother   . Heart attack Paternal Grandmother     Social History Social History    Substance Use Topics  . Smoking status: Never Smoker  . Smokeless tobacco: Never Used  . Alcohol use No     Allergies   Dust mite extract; Pollen extract; and Rye grass flower pollen extract [gramineae pollens]   Review of Systems Review of Systems  Constitutional: Positive for fever.  Respiratory: Positive for cough.   Gastrointestinal: Negative for nausea and vomiting.  Musculoskeletal: Positive for myalgias.  Allergic/Immunologic: Positive for immunocompromised state.  Neurological: Positive for dizziness, speech difficulty and weakness. Negative for syncope, facial asymmetry and numbness.   Physical Exam Updated Vital Signs BP (!) 150/66 (BP Location: Right Arm) Comment: Simultaneous filing. User may not have seen previous data.  Pulse 98 Comment: Simultaneous filing. User may not have seen previous data.  Temp 98.9 F (37.2 C) (Oral)   Resp (!) 22 Comment: Simultaneous filing. User may not have seen previous data.  Ht 5\' 7"  (1.702 m)   Wt 240 lb (108.9 kg)   SpO2 100% Comment: Simultaneous filing. User may not have seen previous data.  BMI 37.59 kg/m   Physical Exam  Constitutional: He appears well-developed and well-nourished. No distress.  Awake, appropriate. Seems slow to answer questions.  HENT:  Head: Normocephalic and atraumatic.  Face is symmetric. Smile is equal.  Eyes: Conjunctivae and EOM are normal.  Neck: Neck supple. No tracheal deviation present.  Cardiovascular: Normal rate.   Pulmonary/Chest: Effort normal. No respiratory distress.  Abdominal: There is no tenderness. There is no CVA tenderness.  Musculoskeletal: Normal range of motion.  Good flexion and extension of bilateral elbows and wrists.  Neurological: He is alert.  Good grip strength bilaterally. Decreased strength with straight leg raise on the left. Good straight leg raise on the right.   Skin: Skin is warm and dry.  Psychiatric: He has a normal mood and affect. His behavior is normal.   Nursing note and vitals reviewed.  ED Treatments / Results  Labs (all labs ordered are listed, but only abnormal results are displayed) Labs Reviewed  TROPONIN I - Abnormal; Notable for the following:       Result Value   Troponin I 0.03 (*)    All other components within normal limits  COMPREHENSIVE METABOLIC PANEL - Abnormal; Notable for the following:    Sodium 134 (*)    Chloride 97 (*)    Glucose, Bld 108 (*)    BUN 21 (*)    AST 106 (*)    All other components within normal limits  CBC WITH DIFFERENTIAL/PLATELET - Abnormal; Notable for the following:    RBC 4.07 (*)    Hemoglobin 11.9 (*)    HCT 37.0 (*)    All other components within normal limits  URINALYSIS, ROUTINE W REFLEX MICROSCOPIC - Abnormal; Notable for the following:    Color, Urine AMBER (*)    Bilirubin Urine MODERATE (*)    Ketones, ur >80 (*)    Leukocytes,  UA MODERATE (*)    All other components within normal limits  URINALYSIS, MICROSCOPIC (REFLEX) - Abnormal; Notable for the following:    Bacteria, UA RARE (*)    Squamous Epithelial / LPF 0-5 (*)    All other components within normal limits  CBG MONITORING, ED - Abnormal; Notable for the following:    Glucose-Capillary 105 (*)    All other components within normal limits  ETHANOL  RAPID URINE DRUG SCREEN, HOSP PERFORMED    EKG  EKG Interpretation  Date/Time:  Wednesday July 14 2016 19:38:28 EDT Ventricular Rate:  105 PR Interval:    QRS Duration: 96 QT Interval:  332 QTC Calculation: 439 R Axis:   78 Text Interpretation:  Sinus tachycardia Borderline low voltage, extremity leads Confirmed by Alvino Chapel  MD, Brittain Hosie [phone]) on 07/14/2016 8:11:50 PM       Radiology Dg Chest 2 View  Result Date: 07/14/2016 CLINICAL DATA:  Persistent cough. The cough is intermittently productive. EXAM: CHEST  2 VIEW COMPARISON:  Chest and rib radiographs 07/16/2015 FINDINGS: The heart size is exaggerated by low lung volumes. Left lower lobe pneumonia is  present. The right lung is clear. A sclerotic changes are noted at the aortic arch. There is no significant effusion or edema. The visualized soft tissues and bony thorax are unremarkable. IMPRESSION: 1. Left lower lobe pneumonia. 2. Atherosclerosis of the aorta. Electronically Signed   By: San Morelle M.D.   On: 07/14/2016 21:53   Ct Head Wo Contrast  Result Date: 07/14/2016 CLINICAL DATA:  Increased dizziness and slurred speech today. [place] out of chair twice. Initial encounter. EXAM: CT HEAD WITHOUT CONTRAST TECHNIQUE: Contiguous axial images were obtained from the base of the skull through the vertex without intravenous contrast. COMPARISON:  None. FINDINGS: Brain: Mild atrophy and white matter changes are present bilaterally. No acute infarct, hemorrhage, or mass lesion is present. The basal ganglia are intact. Insular ribbon is normal. No focal cortical defect is evident. The ventricles are proportionate to the degree of atrophy. No significant extra-axial fluid collection is present. Incidental calcifications are noted at the ideal gland. Vascular: Negative Skull: Calvarium is intact. No focal lytic or blastic lesions are present. Sinuses/Orbits: The paranasal sinuses an mastoid air cells are clear. The globes and orbits are unremarkable. IMPRESSION: 1. Mild age advanced atrophy and white matter disease likely reflects the sequela of chronic microvascular ischemia. 2. No acute intracranial abnormality. Electronically Signed   By: San Morelle M.D.   On: 07/14/2016 21:57    Procedures Procedures (including critical care time)  DIAGNOSTIC STUDIES: Oxygen Saturation is 100% on RA, normal by my interpretation.    COORDINATION OF CARE: 7:55 PM Discussed treatment plan with pt and his brother at bedside and they agreed to plan.  Medications Ordered in ED Medications  azithromycin (ZITHROMAX) 500 MG injection (not administered)  cefTRIAXone (ROCEPHIN) 1 g in dextrose 5 % 50 mL IVPB  (0 g Intravenous Stopped 07/14/16 2245)  azithromycin (ZITHROMAX) 500 mg in dextrose 5 % 250 mL IVPB (500 mg Intravenous New Bag/Given 07/14/16 2304)  aspirin chewable tablet 324 mg (324 mg Oral Given 07/14/16 2348)     Initial Impression / Assessment and Plan / ED Course  I have reviewed the triage vital signs and the nursing notes.  Pertinent labs & imaging results that were available during my care of the patient were reviewed by me and considered in my medical decision making (see chart for details).     Patient with fever.  Some worsening confusion. Had left leg weakness also. Found to have pneumonia. Troponin minimally elevated. EKG reassuring. Will admit to internal medicine.Head CT reassuring. Weakness potentially could be ischemic. Later told that he did have a fall. X-ray of left hip were ordered. Will admit to Dr. Daryll Drown at [facility].  Final Clinical Impressions(s) / ED Diagnoses   Final diagnoses:  Community acquired pneumonia of left lower lobe of lung ([place])  Weakness of left leg    New Prescriptions New Prescriptions   No medications on file   I personally performed the services described in this documentation, which was scribed in my presence. The recorded information has been reviewed and is accurate.      Davonna Belling, MD 07/15/16 [phone]

## 2016-07-15 ENCOUNTER — Inpatient Hospital Stay (HOSPITAL_COMMUNITY): Payer: Medicare Other

## 2016-07-15 ENCOUNTER — Inpatient Hospital Stay (HOSPITAL_BASED_OUTPATIENT_CLINIC_OR_DEPARTMENT_OTHER): Payer: Medicare Other

## 2016-07-15 DIAGNOSIS — Z91048 Other nonmedicinal substance allergy status: Secondary | ICD-10-CM | POA: Diagnosis not present

## 2016-07-15 DIAGNOSIS — M6289 Other specified disorders of muscle: Secondary | ICD-10-CM

## 2016-07-15 DIAGNOSIS — J181 Lobar pneumonia, unspecified organism: Secondary | ICD-10-CM | POA: Diagnosis not present

## 2016-07-15 DIAGNOSIS — K219 Gastro-esophageal reflux disease without esophagitis: Secondary | ICD-10-CM | POA: Diagnosis present

## 2016-07-15 DIAGNOSIS — I639 Cerebral infarction, unspecified: Secondary | ICD-10-CM | POA: Diagnosis present

## 2016-07-15 DIAGNOSIS — N390 Urinary tract infection, site not specified: Secondary | ICD-10-CM | POA: Diagnosis present

## 2016-07-15 DIAGNOSIS — R4781 Slurred speech: Secondary | ICD-10-CM | POA: Diagnosis present

## 2016-07-15 DIAGNOSIS — Z8249 Family history of ischemic heart disease and other diseases of the circulatory system: Secondary | ICD-10-CM | POA: Diagnosis not present

## 2016-07-15 DIAGNOSIS — I10 Essential (primary) hypertension: Secondary | ICD-10-CM | POA: Diagnosis present

## 2016-07-15 DIAGNOSIS — R748 Abnormal levels of other serum enzymes: Secondary | ICD-10-CM

## 2016-07-15 DIAGNOSIS — G8194 Hemiplegia, unspecified affecting left nondominant side: Secondary | ICD-10-CM | POA: Diagnosis not present

## 2016-07-15 DIAGNOSIS — W19XXXA Unspecified fall, initial encounter: Secondary | ICD-10-CM | POA: Diagnosis not present

## 2016-07-15 DIAGNOSIS — I6521 Occlusion and stenosis of right carotid artery: Secondary | ICD-10-CM | POA: Diagnosis not present

## 2016-07-15 DIAGNOSIS — E785 Hyperlipidemia, unspecified: Secondary | ICD-10-CM | POA: Diagnosis present

## 2016-07-15 DIAGNOSIS — R55 Syncope and collapse: Secondary | ICD-10-CM | POA: Diagnosis not present

## 2016-07-15 DIAGNOSIS — R7401 Elevation of levels of liver transaminase levels: Secondary | ICD-10-CM | POA: Diagnosis present

## 2016-07-15 DIAGNOSIS — M79602 Pain in left arm: Secondary | ICD-10-CM | POA: Diagnosis present

## 2016-07-15 DIAGNOSIS — I679 Cerebrovascular disease, unspecified: Secondary | ICD-10-CM | POA: Diagnosis not present

## 2016-07-15 DIAGNOSIS — E860 Dehydration: Secondary | ICD-10-CM | POA: Diagnosis present

## 2016-07-15 DIAGNOSIS — I6330 Cerebral infarction due to thrombosis of unspecified cerebral artery: Secondary | ICD-10-CM | POA: Diagnosis not present

## 2016-07-15 DIAGNOSIS — E669 Obesity, unspecified: Secondary | ICD-10-CM | POA: Diagnosis present

## 2016-07-15 DIAGNOSIS — C61 Malignant neoplasm of prostate: Secondary | ICD-10-CM

## 2016-07-15 DIAGNOSIS — J40 Bronchitis, not specified as acute or chronic: Secondary | ICD-10-CM | POA: Insufficient documentation

## 2016-07-15 DIAGNOSIS — J189 Pneumonia, unspecified organism: Secondary | ICD-10-CM | POA: Diagnosis present

## 2016-07-15 DIAGNOSIS — M6282 Rhabdomyolysis: Secondary | ICD-10-CM | POA: Diagnosis not present

## 2016-07-15 DIAGNOSIS — M25462 Effusion, left knee: Secondary | ICD-10-CM | POA: Diagnosis present

## 2016-07-15 DIAGNOSIS — R778 Other specified abnormalities of plasma proteins: Secondary | ICD-10-CM | POA: Diagnosis present

## 2016-07-15 DIAGNOSIS — R8290 Unspecified abnormal findings in urine: Secondary | ICD-10-CM | POA: Diagnosis not present

## 2016-07-15 DIAGNOSIS — F319 Bipolar disorder, unspecified: Secondary | ICD-10-CM | POA: Diagnosis not present

## 2016-07-15 DIAGNOSIS — R7989 Other specified abnormal findings of blood chemistry: Secondary | ICD-10-CM | POA: Diagnosis present

## 2016-07-15 DIAGNOSIS — I6523 Occlusion and stenosis of bilateral carotid arteries: Secondary | ICD-10-CM | POA: Diagnosis present

## 2016-07-15 DIAGNOSIS — F0150 Vascular dementia without behavioral disturbance: Secondary | ICD-10-CM

## 2016-07-15 DIAGNOSIS — F209 Schizophrenia, unspecified: Secondary | ICD-10-CM | POA: Diagnosis present

## 2016-07-15 DIAGNOSIS — Z7951 Long term (current) use of inhaled steroids: Secondary | ICD-10-CM | POA: Diagnosis not present

## 2016-07-15 DIAGNOSIS — Z6835 Body mass index (BMI) 35.0-35.9, adult: Secondary | ICD-10-CM | POA: Diagnosis not present

## 2016-07-15 DIAGNOSIS — R740 Nonspecific elevation of levels of transaminase and lactic acid dehydrogenase [LDH]: Secondary | ICD-10-CM

## 2016-07-15 DIAGNOSIS — I63231 Cerebral infarction due to unspecified occlusion or stenosis of right carotid arteries: Secondary | ICD-10-CM | POA: Diagnosis not present

## 2016-07-15 DIAGNOSIS — Z79899 Other long term (current) drug therapy: Secondary | ICD-10-CM | POA: Diagnosis not present

## 2016-07-15 DIAGNOSIS — M1712 Unilateral primary osteoarthritis, left knee: Secondary | ICD-10-CM | POA: Diagnosis present

## 2016-07-15 DIAGNOSIS — F0390 Unspecified dementia without behavioral disturbance: Secondary | ICD-10-CM | POA: Diagnosis present

## 2016-07-15 DIAGNOSIS — W07XXXA Fall from chair, initial encounter: Secondary | ICD-10-CM | POA: Diagnosis present

## 2016-07-15 LAB — STREP PNEUMONIAE URINARY ANTIGEN: STREP PNEUMO URINARY ANTIGEN: NEGATIVE

## 2016-07-15 LAB — MAGNESIUM: MAGNESIUM: 2.1 mg/dL (ref 1.7–2.4)

## 2016-07-15 LAB — INFLUENZA PANEL BY PCR (TYPE A & B)
Influenza A By PCR: NEGATIVE
Influenza B By PCR: NEGATIVE

## 2016-07-15 LAB — TROPONIN I
Troponin I: <0.03 ng/mL (ref <0.03)
Troponin I: <0.03 ng/mL (ref <0.03)

## 2016-07-15 LAB — SEDIMENTATION RATE: SED RATE: 47 mm/h — AB (ref 0–16)

## 2016-07-15 LAB — URIC ACID: Uric Acid, Serum: 8.4 mg/dL — ABNORMAL HIGH (ref 4.4–7.6)

## 2016-07-15 LAB — CK: Total CK: 3148 U/L — ABNORMAL HIGH (ref 49–397)

## 2016-07-15 LAB — HIV ANTIBODY (ROUTINE TESTING W REFLEX): HIV SCREEN 4TH GENERATION: NONREACTIVE

## 2016-07-15 LAB — TSH: TSH: 1.142 u[IU]/mL (ref 0.350–4.500)

## 2016-07-15 LAB — PHOSPHORUS: Phosphorus: 3.4 mg/dL (ref 2.5–4.6)

## 2016-07-15 MED ORDER — CALCIUM CARBONATE-VITAMIN D 500-200 MG-UNIT PO TABS
1.0000 | ORAL_TABLET | Freq: Every day | ORAL | Status: DC
  Administered 2016-07-16 – 2016-07-21 (×6): 1 via ORAL
  Filled 2016-07-15 (×6): qty 1

## 2016-07-15 MED ORDER — CEFTRIAXONE SODIUM 1 G IJ SOLR
1.0000 g | INTRAMUSCULAR | Status: DC
  Administered 2016-07-15: 1 g via INTRAVENOUS
  Filled 2016-07-15 (×2): qty 10

## 2016-07-15 MED ORDER — ACETAMINOPHEN 650 MG RE SUPP: 650.0000 mg | Freq: Four times a day (QID) | RECTAL | Status: DC | PRN

## 2016-07-15 MED ORDER — LISINOPRIL 20 MG PO TABS
30.0000 mg | ORAL_TABLET | Freq: Every day | ORAL | Status: DC
  Administered 2016-07-15 – 2016-07-21 (×7): 30 mg via ORAL
  Filled 2016-07-15 (×7): qty 1

## 2016-07-15 MED ORDER — OXYCODONE HCL 5 MG PO TABS
5.0000 mg | ORAL_TABLET | ORAL | Status: DC | PRN
  Administered 2016-07-17 – 2016-07-19 (×2): 5 mg via ORAL
  Filled 2016-07-15 (×2): qty 1

## 2016-07-15 MED ORDER — SODIUM CHLORIDE 0.9 % IV SOLN
INTRAVENOUS | Status: DC
  Administered 2016-07-15 – 2016-07-17 (×4): via INTRAVENOUS

## 2016-07-15 MED ORDER — ACETAMINOPHEN 325 MG PO TABS: 650.0000 mg | ORAL_TABLET | Freq: Four times a day (QID) | ORAL | Status: DC | PRN

## 2016-07-15 MED ORDER — CLONIDINE HCL 0.1 MG PO TABS
0.1000 mg | ORAL_TABLET | Freq: Three times a day (TID) | ORAL | Status: DC
  Administered 2016-07-15 – 2016-07-21 (×20): 0.1 mg via ORAL
  Filled 2016-07-15 (×20): qty 1

## 2016-07-15 MED ORDER — METOPROLOL TARTRATE 50 MG PO TABS
50.0000 mg | ORAL_TABLET | Freq: Two times a day (BID) | ORAL | Status: DC
  Administered 2016-07-15 – 2016-07-21 (×12): 50 mg via ORAL
  Filled 2016-07-15 (×12): qty 1

## 2016-07-15 MED ORDER — ENOXAPARIN SODIUM 40 MG/0.4ML ~~LOC~~ SOLN
40.0000 mg | SUBCUTANEOUS | Status: DC
  Administered 2016-07-15 – 2016-07-18 (×4): 40 mg via SUBCUTANEOUS
  Filled 2016-07-15 (×4): qty 0.4

## 2016-07-15 MED ORDER — ONDANSETRON HCL 4 MG/2ML IJ SOLN
4.0000 mg | Freq: Four times a day (QID) | INTRAMUSCULAR | Status: DC | PRN
Start: 2016-07-15 — End: 2016-07-21

## 2016-07-15 MED ORDER — ONDANSETRON HCL 4 MG PO TABS: 4.0000 mg | ORAL_TABLET | Freq: Four times a day (QID) | ORAL | Status: DC | PRN

## 2016-07-15 MED ORDER — ARIPIPRAZOLE 10 MG PO TABS
10.0000 mg | ORAL_TABLET | Freq: Every day | ORAL | Status: DC
  Administered 2016-07-15 – 2016-07-21 (×7): 10 mg via ORAL
  Filled 2016-07-15 (×7): qty 1

## 2016-07-15 MED ORDER — FINASTERIDE 5 MG PO TABS
5.0000 mg | ORAL_TABLET | Freq: Every day | ORAL | Status: DC
  Administered 2016-07-16 – 2016-07-21 (×6): 5 mg via ORAL
  Filled 2016-07-15 (×6): qty 1

## 2016-07-15 MED ORDER — FLUTICASONE PROPIONATE 50 MCG/ACT NA SUSP
2.0000 | Freq: Every day | NASAL | Status: DC
  Administered 2016-07-15 – 2016-07-21 (×6): 2 via NASAL
  Filled 2016-07-15: qty 16

## 2016-07-15 MED ORDER — SIMVASTATIN 40 MG PO TABS: 40.0000 mg | ORAL_TABLET | Freq: Every evening | ORAL | Status: DC

## 2016-07-15 MED ORDER — FAMOTIDINE 20 MG PO TABS
20.0000 mg | ORAL_TABLET | Freq: Every day | ORAL | Status: DC
  Administered 2016-07-16 – 2016-07-21 (×6): 20 mg via ORAL
  Filled 2016-07-15 (×6): qty 1

## 2016-07-15 MED ORDER — CALCIUM-VITAMIN D 600-200 MG-UNIT PO TABS: 1.0000 | ORAL_TABLET | Freq: Every day | ORAL | Status: DC

## 2016-07-15 MED ORDER — DEXTROSE 5 % IV SOLN
500.0000 mg | INTRAVENOUS | Status: DC
  Administered 2016-07-16: 500 mg via INTRAVENOUS
  Filled 2016-07-15 (×2): qty 500

## 2016-07-15 NOTE — Progress Notes (Signed)
Patient received from [facility] via bed. Vital signs are stable except temp 99.9. Patient is on telemetry sinus rhythm and continuous pulse oxymetre. Skin assessment done with another nurse small abrasion on left elbow. Patient given instruction about phone, call bell and bed alarm.bed side rail up x 2. Patient is on npo.

## 2016-07-15 NOTE — Progress Notes (Addendum)
Uric acid elevated at 8.4 with CK elevated at 3148 troponin is normal. At this time x-ray of knee and MR brain has not resulted. Check ESR. Unclear at this juncture if dealing with acute gout versus septic arthritis. Has no leukocytosis although does have low-grade fevers with rest x-ray demonstrating left lower lobe pneumonia. Blood cultures are pending-we'll hold his statin in relation to elevated CK and repeat CK in a.m.  Erin Hearing ANP

## 2016-07-15 NOTE — H&P (Signed)
History and Physical    Ronald [name] IWP:[id number] DOB: [birth year] DOA: 07/14/2016   PCP: Leeanne Rio, PA-C   Patient coming from/Resides with: Private residence/family  Admission status: Inpatient/telemetry -medically necessary to stay a minimum 2 midnights to rule out impending and/or unexpected changes in physiologic status that may differ from initial evaluation performed in the ER and/or at time of admission. Presents with cough, fever and x-ray findings c/w left lower lobe pneumonia. Also has left-sided weakness ongoing since April 9 with negative CT of the head. Recent fall now with left knee pain and effusion. Patient will require IV fluids for hydration, empiric antibiotics for community-acquired pneumonia, he will also require MRI of the brain to rule out subtle stroke but more concerning possible metastatic disease given known history of prostate cancer. He will require PT and OT because of the left-sided weakness/left knee pain and recent falls.  Chief Complaint: Recent fall; cough, fever, congestion and left knee pain  HPI: Ronald [name] is a 71 y.o. male with medical history significant for dementia/severe bipolar disorder, prostate cancer on medical therapy, hypertension, dyslipidemia, and GERD. Patient reports that for at least one week he has had nonproductive cough and fevers with congestion. Family reported to triage nurse that the patient had reported increased dizziness and they noticed slurred speech and that he fell out of the chair twice and was reporting left arm pain. He was unable to stand at home. Patient reports that he also fell out of a chair on March 9 went to his physician for evaluation. Since that time he has had left knee pain and has been unable to walk. He noticed on April 9 left side weakness. He is not having any shoulder or arm pain on the left. He denies choking or coughing with eating. He currently denies shortness of breath.   ED Course:    Vital Signs: BP (!) 158/60 (BP Location: Right Arm)   Pulse 93   Temp 99.1 F (37.3 C) (Oral)   Resp 18   Ht 5\' 9"  (1.753 m)   Wt 112.8 kg (248 lb 9.6 oz)   SpO2 97%   BMI 36.71 kg/m  2 view CXR: Left lower lobe pneumonia  CT head without contrast: No acute abnormality DG left hip: Neg Lab data: Sodium 134, chloride 97, potassium 4.2, CO2 24, glucose 108, BUN 21, creatinine 1.11, AST 106 number ALT 38, troponin 0.03, white count 9400 with neutrophils 79% and absolute neutrophils 7.5%, hemoglobin 11.9, platelets 276,000, urinalysis abnormal with rare bacteria, moderate bilirubin, amber color, ketones greater then 80, leukocytes moderate, WBCs 6-30, alcohol level less than 5, urine drug screen negative, blood cultures obtained in the ER. Medications and treatments: Rocephin 1 g IV 1, Zithromax 500 mg IV 1, aspirin 324 mg 1  Review of Systems:  In addition to the HPI above,  No Headache, changes with Vision or hearing, new weakness, tingling, numbness in any extremity,  dysarthria or word finding difficulty, tremors or seizure activity No problems swallowing food or Liquids, indigestion/reflux, choking or coughing while eating, abdominal pain with or after eating No Chest pain, Cough or Shortness of Breath, palpitations, orthopnea or DOE No Abdominal pain, N/V, melena,hematochezia, dark tarry stools, constipation No dysuria, malodorous urine, hematuria or flank pain No new skin rashes, lesions, masses or bruises, No recent unintentional weight gain or loss No polyuria, polydypsia or polyphagia   Past Medical History:  Diagnosis Date  . Chicken pox   . Dementia   . Depressed   .  Hyperlipemia   . Hypertension   . Measles   . Mumps   . 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 Unremarkable      Social History   Social History  . Marital status: Single    Spouse name: N/A  . Number of children: N/A  .  Years of education: N/A   Occupational History  . Not on file.   Social History Main Topics  . Smoking status: Never Smoker  . Smokeless tobacco: Never Used  . Alcohol use No  . Drug use: No  . Sexual activity: No   Other Topics Concern  . Not on file   Social History Narrative  . No narrative on file    Mobility: RW Work history: Not obtained   Allergies  Allergen Reactions  . Dust Mite Extract Other (See Comments)    Allergy symptoms   . Pollen Extract Other (See Comments)    Allergy symptoms   . Rye Grass Flower Pollen Extract [Gramineae Pollens] Other (See Comments)    Allergy Symptoms     Family History  Problem Relation Age of Onset  . Hypertension Father   . Hyperlipidemia Father   . Congestive Heart Failure Father 46    Deceased-1995  . Diabetes Mother   . Kidney failure Mother 73    Deceased-2005  . Hypertension Mother   . Congestive Heart Failure Maternal Grandmother   . Heart failure Paternal Grandmother   . Heart attack Paternal Grandmother       Prior to Admission medications   Medication Sig Start Date End Date Taking? Authorizing Provider  ARIPiprazole (ABILIFY) 10 MG tablet TAKE 1 TABLET BY MOUTH EVERY DAY 01/16/16   Brunetta Jeans, PA-C  Calcium-Vitamin D 600-200 MG-UNIT per tablet Take 1 tablet by mouth daily.    Historical Provider, MD  cloNIDine (CATAPRES) 0.1 MG tablet Take 1 tablet (0.1 mg total) by mouth 3 (three) times daily. 10/21/15   Brunetta Jeans, PA-C  finasteride (PROSCAR) 5 MG tablet Take 5 mg by mouth daily.    Historical Provider, MD  fluticasone (FLONASE) 50 MCG/ACT nasal spray Place 2 sprays into both nostrils daily. 10/21/15   Brunetta Jeans, PA-C  hydrochlorothiazide (HYDRODIURIL) 25 MG tablet TAKE 1 TABLET (25 MG TOTAL) BY MOUTH 2 (TWO) TIMES DAILY. 01/16/16   Brunetta Jeans, PA-C  lisinopril (PRINIVIL,ZESTRIL) 30 MG tablet TAKE 1 TABLET (30 MG TOTAL) BY MOUTH DAILY. 05/28/16   Brunetta Jeans, PA-C  metoprolol  (LOPRESSOR) 50 MG tablet TAKE 1 TABLET BY MOUTH TWICE A DAY 05/28/16   Brunetta Jeans, PA-C  potassium chloride SA (KLOR-CON M20) 20 MEQ tablet TAKE 1 TABLET (20 MEQ TOTAL) BY MOUTH DAILY. 10/31/15   Brunetta Jeans, PA-C  ranitidine (ZANTAC) 150 MG capsule TAKE ONE CAPSULE BY MOUTH TWICE A DAY 04/18/15   Brunetta Jeans, PA-C  simvastatin (ZOCOR) 40 MG tablet TAKE 1 TABLET (40 MG TOTAL) BY MOUTH EVERY EVENING. 04/16/16   Brunetta Jeans, PA-C    Physical Exam: Vitals:   07/15/16 0200 07/15/16 0328 07/15/16 0553 07/15/16 0758  BP: (!) 169/78 (!) 148/69 (!) 149/56 (!) 158/60  Pulse: 97 97 92 93  Resp: (!) 22 19 19 18   Temp:  99.9 F (37.7 C) 98.1 F (36.7 C) 99.1 F (37.3 C)  TempSrc:  Oral Oral Oral  SpO2: 97% 97% 96% 97%  Weight:  112.8  kg (248 lb 9.6 oz)    Height:  5\' 9"  (1.753 m)        Constitutional: NAD, calm, comfortable Eyes: PERRL, lids and conjunctivae normal ENMT: Mucous membranes are moist. Posterior pharynx clear of any exudate or lesions.Normal dentition.  Neck: normal, supple, no masses, no thyromegaly Respiratory: Coarse to auscultation bilaterally, no wheezing, no crackles. Normal respiratory effort without accessory muscle use. RA Cardiovascular: Regular rate and rhythm, no murmurs / rubs / gallops. No extremity edema. 2+ pedal pulses. No carotid bruits.  Abdomen: no tenderness, no masses palpated. No hepatosplenomegaly. Bowel sounds positive.  Musculoskeletal: no clubbing / cyanosis. No joint deformity upper and lower extremities. Left knee swollen and warm to the touch without definitive erythema with effusive changes predominantly over her patella bursa. Good ROM, no contractures. Normal muscle tone.  Skin: no rashes, lesions, ulcers. No induration Neurologic: CN 2-12 grossly intact. Sensation intact, DTR normal (did not test patellar on left secondary to pain). Strength 5/5 on right and 4/5 on left. Notable decreased extensor and flexor strength and left upper  extremity, unable to lift left lower extremity on the bed and decreased extensor and flexor strength. Uncertain how much pain in left lower extremity continuing to neurological abnormalities Psychiatric: Alert and appears to be oriented x 3. Normal mood.    Labs on Admission: I have personally reviewed following labs and imaging studies  CBC:  Recent Labs Lab 07/14/16 1954  WBC 9.4  NEUTROABS 7.5  HGB 11.9*  HCT 37.0*  MCV 90.9  PLT 182   Basic Metabolic Panel:  Recent Labs Lab 07/14/16 1943 07/15/16 0827  NA 134*  --   K 4.2  --   CL 97*  --   CO2 24  --   GLUCOSE 108*  --   BUN 21*  --   CREATININE 1.11  --   CALCIUM 9.7  --   MG  --  2.1  PHOS  --  3.4   GFR: Estimated Creatinine Clearance: 77.7 mL/min (by C-G formula based on SCr of 1.11 mg/dL). Liver Function Tests:  Recent Labs Lab 07/14/16 1943  AST 106*  ALT 38  ALKPHOS 84  BILITOT 0.7  PROT 7.5  ALBUMIN 3.7   No results for input(s): LIPASE, AMYLASE in the last 168 hours. No results for input(s): AMMONIA in the last 168 hours. Coagulation Profile: No results for input(s): INR, PROTIME in the last 168 hours. Cardiac Enzymes:  Recent Labs Lab 07/14/16 1943  TROPONINI 0.03*   BNP (last 3 results) No results for input(s): PROBNP in the last 8760 hours. HbA1C: No results for input(s): HGBA1C in the last 72 hours. CBG:  Recent Labs Lab 07/14/16 1945  GLUCAP 105*   Lipid Profile: No results for input(s): CHOL, HDL, LDLCALC, TRIG, CHOLHDL, LDLDIRECT in the last 72 hours. Thyroid Function Tests: No results for input(s): TSH, T4TOTAL, FREET4, T3FREE, THYROIDAB in the last 72 hours. Anemia Panel: No results for input(s): VITAMINB12, FOLATE, FERRITIN, TIBC, IRON, RETICCTPCT in the last 72 hours. Urine analysis:    Component Value Date/Time   COLORURINE AMBER (A) 07/14/2016 2130   APPEARANCEUR CLEAR 07/14/2016 2130   LABSPEC 1.028 07/14/2016 2130   PHURINE 5.5 07/14/2016 2130   GLUCOSEU  NEGATIVE 07/14/2016 2130   GLUCOSEU NEGATIVE 04/18/2015 0820   HGBUR NEGATIVE 07/14/2016 2130   BILIRUBINUR MODERATE (A) 07/14/2016 2130   KETONESUR >80 (A) 07/14/2016 2130   PROTEINUR NEGATIVE 07/14/2016 2130   UROBILINOGEN 0.2 04/18/2015 0820   NITRITE  NEGATIVE 07/14/2016 2130   LEUKOCYTESUR MODERATE (A) 07/14/2016 2130   Sepsis Labs: @LABRCNTIP (procalcitonin:4,lacticidven:4) )No results found for this or any previous visit (from the past 240 hour(s)).   Radiological Exams on Admission: Dg Chest 2 View  Result Date: 07/14/2016 CLINICAL DATA:  Persistent cough. The cough is intermittently productive. EXAM: CHEST  2 VIEW COMPARISON:  Chest and rib radiographs 07/16/2015 FINDINGS: The heart size is exaggerated by low lung volumes. Left lower lobe pneumonia is present. The right lung is clear. A sclerotic changes are noted at the aortic arch. There is no significant effusion or edema. The visualized soft tissues and bony thorax are unremarkable. IMPRESSION: 1. Left lower lobe pneumonia. 2. Atherosclerosis of the aorta. Electronically Signed   By: San Morelle M.D.   On: 07/14/2016 21:53   Ct Head Wo Contrast  Result Date: 07/14/2016 CLINICAL DATA:  Increased dizziness and slurred speech today. [place] out of chair twice. Initial encounter. EXAM: CT HEAD WITHOUT CONTRAST TECHNIQUE: Contiguous axial images were obtained from the base of the skull through the vertex without intravenous contrast. COMPARISON:  None. FINDINGS: Brain: Mild atrophy and white matter changes are present bilaterally. No acute infarct, hemorrhage, or mass lesion is present. The basal ganglia are intact. Insular ribbon is normal. No focal cortical defect is evident. The ventricles are proportionate to the degree of atrophy. No significant extra-axial fluid collection is present. Incidental calcifications are noted at the ideal gland. Vascular: Negative Skull: Calvarium is intact. No focal lytic or blastic lesions are  present. Sinuses/Orbits: The paranasal sinuses an mastoid air cells are clear. The globes and orbits are unremarkable. IMPRESSION: 1. Mild age advanced atrophy and white matter disease likely reflects the sequela of chronic microvascular ischemia. 2. No acute intracranial abnormality. Electronically Signed   By: San Morelle M.D.   On: 07/14/2016 21:57   Dg Hip Unilat W Or Wo Pelvis 2-3 Views Left  Result Date: 07/15/2016 CLINICAL DATA:  Left hip pain after a fall last night. EXAM: DG HIP (WITH OR WITHOUT PELVIS) 2-3V LEFT COMPARISON:  None. FINDINGS: There is no evidence of hip fracture or dislocation. There is no evidence of arthropathy or other focal bone abnormality. IMPRESSION: Negative. Electronically Signed   By: Lucienne Capers M.D.   On: 07/15/2016 02:48    EKG: (Independently reviewed) sinus tachycardia ventricular rate 105 bpm, QTC 439 ms, normal R-wave rotation, no ischemic changes  Assessment/Plan Principal Problem:   CAP (community acquired pneumonia) -Presents with fever, cough and congestion with left lower lobe pneumonia noted on chest x-ray -Continue empiric Rocephin and Zithromax -Blood cultures (they were not obtained in the ER prior to administration of antibiotics) -Sputum culture -Urinary Legionella and strep -Influenza PCR -Gentle IV fluid hydration for 24 hours -2 view chest x-ray in a.m. after hydration  Active Problems:   Dementia -Monitor for acute delirium in setting of hospitalization and acute illness    Acute left-sided weakness -Left upper and lower extremity weakness without facial involvement noted on exam -MRI brain -History of prostate cancer- ?? Mets -PT/OT evaluation -We'll need to notify neurology and institute ischemic stroke order set if MRI positive but it is noted that patient reported onset of symptoms greater than 10 days ago and initial CT negative    Effusion of left knee -Onset after fall -X-ray left knee -Uric acid to rule  out gout    Essential hypertension -Continue preadmission Catapres, Lopressor and Prinivil -Appears dehydrated in setting of pneumonia so we'll hold Isaiah diuretic  Prostate cancer  -Load by urology/[place] -Previously treated with radiation and Lupron -MRI as above -Continue Proscar    Abnormal urinalysis -Appears consistent with UTI -No symptoms consistent with retention-A she does have history of prostate cancer -Urine culture -Rocephin as ordered above would cover for typical UTI    Elevated AST (SGOT) -New finding -Patient denies use of alcohol -UDS and alcohol screen negative -Repeat lab in a.m. -?? Due to dehydration -Obtain CK    Elevated troponin -No chest pain -EKG unremarkable -Continue telemetry -Cycle    Hyperlipidemia -Continue Zocor    GERD (gastroesophageal reflux disease) -Continue H2 blocker    Bipolar 1 disorder  -Continue Abilify      DVT prophylaxis: Lovenox  Code Status: Full Family Communication: No family at bedside  Disposition Plan: Home Consults called: None    Gearald Stonebraker L. ANP-BC [facility] Pager [phone]   If 7PM-7AM, please contact night-coverage [email or web address] Password [facility]  07/15/2016, 9:53 AM

## 2016-07-16 ENCOUNTER — Inpatient Hospital Stay (HOSPITAL_COMMUNITY): Payer: Medicare Other

## 2016-07-16 DIAGNOSIS — W19XXXA Unspecified fall, initial encounter: Secondary | ICD-10-CM

## 2016-07-16 DIAGNOSIS — I6521 Occlusion and stenosis of right carotid artery: Secondary | ICD-10-CM

## 2016-07-16 DIAGNOSIS — I10 Essential (primary) hypertension: Secondary | ICD-10-CM

## 2016-07-16 DIAGNOSIS — J189 Pneumonia, unspecified organism: Principal | ICD-10-CM

## 2016-07-16 DIAGNOSIS — I679 Cerebrovascular disease, unspecified: Secondary | ICD-10-CM

## 2016-07-16 DIAGNOSIS — G8194 Hemiplegia, unspecified affecting left nondominant side: Secondary | ICD-10-CM

## 2016-07-16 DIAGNOSIS — M6282 Rhabdomyolysis: Secondary | ICD-10-CM

## 2016-07-16 LAB — URINE CULTURE: CULTURE: NO GROWTH

## 2016-07-16 LAB — COMPREHENSIVE METABOLIC PANEL
ALBUMIN: 2.7 g/dL — AB (ref 3.5–5.0)
ALT: 30 U/L (ref 17–63)
ANION GAP: 8 (ref 5–15)
AST: 54 U/L — ABNORMAL HIGH (ref 15–41)
Alkaline Phosphatase: 67 U/L (ref 38–126)
BUN: 17 mg/dL (ref 6–20)
CALCIUM: 8.7 mg/dL — AB (ref 8.9–10.3)
CO2: 26 mmol/L (ref 22–32)
Chloride: 101 mmol/L (ref 101–111)
Creatinine, Ser: 0.96 mg/dL (ref 0.61–1.24)
GFR calc non Af Amer: >60 mL/min (ref >60)
GLUCOSE: 105 mg/dL — AB (ref 65–99)
Potassium: 4.1 mmol/L (ref 3.5–5.1)
SODIUM: 135 mmol/L (ref 135–145)
Total Bilirubin: 0.7 mg/dL (ref 0.3–1.2)
Total Protein: 5.9 g/dL — ABNORMAL LOW (ref 6.5–8.1)

## 2016-07-16 LAB — CBC
HCT: 33.1 % — ABNORMAL LOW (ref 39.0–52.0)
HEMOGLOBIN: 10.1 g/dL — AB (ref 13.0–17.0)
MCH: 28 pg (ref 26.0–34.0)
MCHC: 30.5 g/dL (ref 30.0–36.0)
MCV: 91.7 fL (ref 78.0–100.0)
PLATELETS: 224 10*3/uL (ref 150–400)
RBC: 3.61 MIL/uL — ABNORMAL LOW (ref 4.22–5.81)
RDW: 14.4 % (ref 11.5–15.5)
WBC: 6.1 10*3/uL (ref 4.0–10.5)

## 2016-07-16 LAB — CK: CK TOTAL: 1897 U/L — AB (ref 49–397)

## 2016-07-16 MED ORDER — LEVOFLOXACIN 500 MG PO TABS
500.0000 mg | ORAL_TABLET | Freq: Every day | ORAL | Status: AC
Start: 2016-07-16 — End: 2016-07-19
  Administered 2016-07-16 – 2016-07-19 (×4): 500 mg via ORAL
  Filled 2016-07-16 (×4): qty 1

## 2016-07-16 MED ORDER — ASPIRIN EC 81 MG PO TBEC
81.0000 mg | DELAYED_RELEASE_TABLET | Freq: Every day | ORAL | Status: DC
  Administered 2016-07-16 – 2016-07-19 (×4): 81 mg via ORAL
  Filled 2016-07-16 (×4): qty 1

## 2016-07-16 MED ORDER — ATORVASTATIN CALCIUM 80 MG PO TABS
80.0000 mg | ORAL_TABLET | Freq: Every day | ORAL | Status: DC
  Administered 2016-07-16 – 2016-07-20 (×5): 80 mg via ORAL
  Filled 2016-07-16 (×5): qty 1

## 2016-07-16 NOTE — Care Management Note (Signed)
Case Management Note  Patient Details  Name: Ronald [name] MRN: [id number] Date of Birth: [birth year]  Subjective/Objective:                 Spoke with patient and family at the bedside. Patient would like to have [facility] provided by [facility] at DC, would also like a RW. States family is able to provide 24 hour supervision.    Action/Plan:  CM will continue to follow for DC planning.  Expected Discharge Date:                  Expected Discharge Plan:  [place]  In-House Referral:     Discharge planning Services  CM Consult  Post Acute Care Choice:    Choice offered to:     DME Arranged:    DME Agency:     HH Arranged:    Park Agency:     Status of Service:  In process, will continue to follow  If discussed at Long Length of Stay Meetings, dates discussed:    Additional Comments:  Carles Collet, RN 07/16/2016, 1:38 PM

## 2016-07-16 NOTE — Evaluation (Signed)
Physical Therapy Evaluation Patient Details Name: Ronald [name] MRN: [id number] DOB: [birth year] Today's Date: 07/16/2016    History of Present Illness  71 yo male with onset of CAP, fall after L side became weak, R ICA stenosis noted, gliosis of R cerebellar white matter, L knee OA and L LL PNA.  Clinical Impression  Pt was seen for assessment of mobility and noted some confusion on his part about what his day had been like prior to PT arrival.   He is appropriate for SNF transition due to his loss of independence with gait and new LLE weakness with poor control of transitions out of the bed.  He will be recommended to continue acutely to strengthen LE's and progress with gait as he tolerates,  Focusing on standing control and safety awareness.      Follow Up Recommendations SNF    Equipment Recommendations  None recommended by PT    Recommendations for Other Services       Precautions / Restrictions Precautions Precautions: Fall (telemetry) Restrictions Weight Bearing Restrictions: No      Mobility  Bed Mobility Overal bed mobility: Needs Assistance Bed Mobility: Supine to Sit     Supine to sit: Mod assist     General bed mobility comments: assisted under his trunk to lift up and sit with some control  Transfers Overall transfer level: Needs assistance Equipment used: Rolling walker (2 wheeled);1 person hand held assist Transfers: Sit to/from [facility] Sit to Stand: Min assist Stand pivot transfers: Min assist       General transfer comment: reminders for hand placement and sequence  Ambulation/Gait Ambulation/Gait assistance: Min assist Ambulation Distance (Feet): 6 Feet Assistive device: Rolling walker (2 wheeled);1 person hand held assist Gait Pattern/deviations: Step-through pattern;Trunk flexed;Wide base of support;Shuffle;Decreased stride length Gait velocity: reduced Gait velocity interpretation: Below normal speed for  age/gender General Gait Details: pt is struggling to wb on LLE and move it effectively but improved with practice  Stairs            Wheelchair Mobility    Modified Rankin (Stroke Patients Only) Modified Rankin (Stroke Patients Only) Pre-Morbid Rankin Score: Slight disability Modified Rankin: Moderately severe disability     Balance Overall balance assessment: History of Falls                                           Pertinent Vitals/Pain Pain Assessment: Faces Pain Score: 2  Pain Location: L knee Pain Descriptors / Indicators: Sore Pain Intervention(s): Monitored during session;Repositioned    Home Living Family/patient expects to be discharged to:: Skilled nursing facility                      Prior Function Level of Independence: Independent with assistive device(s)         Comments: used rollator, RW at home     Hand Dominance        Extremity/Trunk Assessment   Upper Extremity Assessment Upper Extremity Assessment: Generalized weakness    Lower Extremity Assessment Lower Extremity Assessment: Generalized weakness    Cervical / Trunk Assessment Cervical / Trunk Assessment: Normal  Communication   Communication: No difficulties  Cognition Arousal/Alertness: Awake/alert Behavior During Therapy: WFL for tasks assessed/performed Overall Cognitive Status: No family/caregiver present to determine baseline cognitive functioning  General Comments: pt is mildly confused about the events of his day      General Comments      Exercises     Assessment/Plan    PT Assessment Patient needs continued PT services  PT Problem List Decreased strength;Decreased range of motion;Decreased activity tolerance;Decreased balance;Decreased mobility;Decreased coordination;Decreased cognition;Decreased knowledge of use of DME;Decreased safety awareness;Cardiopulmonary status limiting  activity;Obesity;Pain       PT Treatment Interventions DME instruction;Gait training;Functional mobility training;Therapeutic exercise;Therapeutic activities;Balance training;Neuromuscular re-education;Patient/family education    PT Goals (Current goals can be found in the Care Plan section)  Acute Rehab PT Goals Patient Stated Goal: to start walking again. PT Goal Formulation: With patient Time For Goal Achievement: 07/30/16 Potential to Achieve Goals: Good    Frequency Min 3X/week   Barriers to discharge Inaccessible home environment stairs to enter house    Co-evaluation               End of Session Equipment Utilized During Treatment: Gait belt Activity Tolerance: Patient tolerated treatment well;Patient limited by fatigue Patient left: in chair;with call bell/phone within reach;with chair alarm set Nurse Communication: Mobility status PT Visit Diagnosis: Unsteadiness on feet (R26.81);History of falling (Z91.81);Ataxic gait (R26.0)    Time: 0962-8366 PT Time Calculation (min) (ACUTE ONLY): 26 min   Charges:   PT Evaluation $PT Eval Moderate Complexity: 1 Procedure PT Treatments $Gait Training: 8-22 mins   PT G Codes:   PT G-Codes **NOT FOR INPATIENT CLASS** Functional Assessment Tool Used: AM-PAC 6 Clicks Basic Mobility    Ramond Dial 07/16/2016, 8:58 PM   Mee Hives, PT MS Acute Rehab Dept. Number: [place] and [place]

## 2016-07-16 NOTE — Consult Note (Signed)
Neurology Consult Note  Reason for Consultation: Abnormal MRI, left-sided weakness  Requesting provider: Lawson Radar, MD  CC: "I'm weak on my left side"   HPI: This is a 71 year old right-handed man who presented to the emergency department with cough and fever on 07/14/16. He also complained of left-sided weakness and fall. History is obtained directly from the patient who is a fair historian. I've also reviewed available medical records for additional information as needed. No family is present at the bedside at the time of my visit.  The patient reports that he has had weakness in his left arm for about 3 weeks. He says that this has improved slightly since its onset but he still has difficulty using his left arm normally. He states that he initially did not have any problems with his left leg. However, he says that on the day of admission, he was eating breakfast when he felt dizzy and thinks he fell asleep sitting in his chair. He says that he then fell out of his chair to the ground, landing on his left side. Ever since this fall, he has had pain in his left leg located between the knee and the hip. He says the pain increases with movement of the leg. He feels like his difficulty moving his leg is related to pain rather than actual weakness.  According to the chart, family reported to ED staff that the patient complained of left arm pain after falling out of his chair. They also noted that he was unable to stand up. They indicated a prior fall on March 9, after which he was seen by his primary physician. After the initial fall he did been complaining of left knee pain and had been unable to walk.  In the emergency department, chest x-ray revealed left lower lobe pneumonia. He was admitted and placed on antibiotics and fluids. MRI scan of the brain was obtained due to concern for possible stroke. This showed no evidence of acute ischemia but did reveal some asymmetric chronic ischemic change in  the right cerebral hemisphere with abnormal flow void in the right internal carotid artery. Neurology consultation is now requested for further recommendations.  PMH:  Past Medical History:  Diagnosis Date  . Chicken pox   . Dementia   . Depressed   . Hyperlipemia   . Hypertension   . Measles   . Mumps   . 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 Unremarkable      Family history: Family History  Problem Relation Age of Onset  . Hypertension Father   . Hyperlipidemia Father   . Congestive Heart Failure Father 25    Deceased-1995  . Diabetes Mother   . Kidney failure Mother 59    Deceased-2005  . Hypertension Mother   . Congestive Heart Failure Maternal Grandmother   . Heart failure Paternal Grandmother   . Heart attack Paternal Grandmother     Social history:  Social History   Social History  . Marital status: Single    Spouse name: N/A  . Number of children: N/A  . Years of education: N/A   Occupational History  . Not on file.   Social History Main Topics  . Smoking status: Never Smoker  . Smokeless tobacco: Never Used  . Alcohol use No  . Drug use: No  . Sexual activity: No   Other Topics Concern  . Not on file  Social History Narrative  . No narrative on file    Current outpatient meds: Medications reviewed and reconciled.  Current Meds  Medication Sig  . fluticasone (FLONASE) 50 MCG/ACT nasal spray Place 2 sprays into both nostrils daily.  . hydrochlorothiazide (HYDRODIURIL) 25 MG tablet TAKE 1 TABLET (25 MG TOTAL) BY MOUTH 2 (TWO) TIMES DAILY.  Marland Kitchen lisinopril (PRINIVIL,ZESTRIL) 30 MG tablet TAKE 1 TABLET (30 MG TOTAL) BY MOUTH DAILY.  . metoprolol (LOPRESSOR) 50 MG tablet TAKE 1 TABLET BY MOUTH TWICE A DAY    Current inpatient meds: Medications reviewed and reconciled.  Current Facility-Administered Medications  Medication Dose Route Frequency Provider Last Rate Last  Dose  . 0.9 %  sodium chloride infusion   Intravenous Continuous Samella Parr, NP 75 mL/hr at 07/16/16 0032    . acetaminophen (TYLENOL) tablet 650 mg  650 mg Oral Q6H PRN Samella Parr, NP       Or  . acetaminophen (TYLENOL) suppository 650 mg  650 mg Rectal Q6H PRN Samella Parr, NP      . ARIPiprazole (ABILIFY) tablet 10 mg  10 mg Oral Daily Samella Parr, NP   10 mg at 07/15/16 1205  . azithromycin (ZITHROMAX) 500 mg in dextrose 5 % 250 mL IVPB  500 mg Intravenous Q24H Samella Parr, NP 250 mL/hr at 07/16/16 0032 500 mg at 07/16/16 0032  . calcium-vitamin D (OSCAL WITH D) 500-200 MG-UNIT per tablet 1 tablet  1 tablet Oral Q breakfast Sid Falcon, MD      . cefTRIAXone (ROCEPHIN) 1 g in dextrose 5 % 50 mL IVPB  1 g Intravenous Q24H Samella Parr, NP 100 mL/hr at 07/15/16 2334 1 g at 07/15/16 2334  . cloNIDine (CATAPRES) tablet 0.1 mg  0.1 mg Oral TID Samella Parr, NP   0.1 mg at 07/15/16 2334  . enoxaparin (LOVENOX) injection 40 mg  40 mg Subcutaneous Q24H Samella Parr, NP   40 mg at 07/15/16 9528  . famotidine (PEPCID) tablet 20 mg  20 mg Oral Daily Samella Parr, NP      . finasteride (PROSCAR) tablet 5 mg  5 mg Oral Daily Samella Parr, NP      . fluticasone (FLONASE) 50 MCG/ACT nasal spray 2 spray  2 spray Each Nare Daily Samella Parr, NP   2 spray at 07/15/16 1205  . lisinopril (PRINIVIL,ZESTRIL) tablet 30 mg  30 mg Oral Daily Samella Parr, NP   30 mg at 07/15/16 1811  . metoprolol (LOPRESSOR) tablet 50 mg  50 mg Oral BID Samella Parr, NP   50 mg at 07/15/16 2333  . ondansetron (ZOFRAN) tablet 4 mg  4 mg Oral Q6H PRN Samella Parr, NP       Or  . ondansetron [facility]) injection 4 mg  4 mg Intravenous Q6H PRN Samella Parr, NP      . oxyCODONE (Oxy IR/ROXICODONE) immediate release tablet 5 mg  5 mg Oral Q4H PRN Samella Parr, NP        Allergies: Allergies  Allergen Reactions  . Dust Mite Extract Other (See Comments)    Allergy symptoms   .  Pollen Extract Other (See Comments)    Allergy symptoms   . Rye Grass Flower Pollen Extract [Gramineae Pollens] Other (See Comments)    Allergy Symptoms     ROS: As per HPI. A full 14-point review of systems was performed and is otherwise unremarkable.  PE:  BP (!) 125/56 (BP Location: Right Arm)   Pulse 71   Temp 98.5 F (36.9 C)   Resp 18   Ht '5\' 9"'$  (1.753 m)   Wt 110.5 kg (243 lb 9.7 oz)   SpO2 96%   BMI 35.97 kg/m   General: WD obese African-American man sitting in bed watching TV. He is in no acute distress. AAO x4. Speech clear, no significant dysarthria. No aphasia. Follows commands briskly. Affect is bright with congruent mood. Comportment is normal.  HEENT: Normocephalic. Neck supple without LAD. MMM, OP clear. Dentition poor. Sclerae anicteric. No conjunctival injection.  CV: Regular, no murmur. Carotid pulses full and symmetric, no bruits. Distal pulses 2+ and symmetric.  Lungs: Coarse breath sounds noted both bases. He seems to be breathing comfortably.  Abdomen: Soft, obese, non-distended, non-tender. Bowel sounds present x4.  Extremities: He has mild edema both lower extremities. Significant toenail fungus noted both feet. Neuro:  CN: Pupils are equal and round. They are symmetrically reactive from 3-->2 mm. Visual fields are full to confrontation. EOMI without nystagmus. There is breakup of smooth pursuit in all directions of gaze. No reported diplopia. Facial sensation is intact to light touch. Face is symmetric at rest with normal strength and mobility. Hearing is intact to conversational voice. Palate elevates symmetrically and uvula is midline. Voice is normal in tone, pitch and quality. Bilateral SCM and trapezii are 5/5. Tongue is midline with normal bulk and mobility.  Motor: Normal bulk, tone. He has 5/5 strength on the right side. On the left, he has 4/5 strength with triceps, wrist extension, dorsiflexion, plantar flexion; 4+/5 strength with finger extension,  grip, knee flexion; 4-/5 hip flexion. Testing of the left lower extremity is somewhat clouded by complaints of pain in the left thigh when he moves that leg. There is an element of give way at times but he does appear to have some true weakness in that leg. No tremor or other abnormal movements.  Sensation: Light touch and pinprick are reduced in the left arm and leg.Marland Kitchen  DTRs: 2+ on the right, 3+ on the left. Both ankle jerks are absent. Toes mute bilaterally.  Coordination: Finger-to-nose is slower on the left than the right because of weakness but there is no overt dysmetria. Finger taps are slower on the left than the right.  Gait: This was not attempted due to the patient's left hemiparesis.   Labs:  Lab Results  Component Value Date   WBC 6.1 07/16/2016   HGB 10.1 (L) 07/16/2016   HCT 33.1 (L) 07/16/2016   PLT 224 07/16/2016   GLUCOSE 105 (H) 07/16/2016   CHOL 119 04/18/2015   TRIG 97.0 04/18/2015   HDL 37.40 (L) 04/18/2015   LDLCALC 63 04/18/2015   ALT 30 07/16/2016   AST 54 (H) 07/16/2016   NA 135 07/16/2016   K 4.1 07/16/2016   CL 101 07/16/2016   CREATININE 0.96 07/16/2016   BUN 17 07/16/2016   CO2 26 07/16/2016   TSH 1.142 07/15/2016   PSA 9.82 (H) 02/26/2013   HGBA1C 5.2 04/18/2015   CK 3148--> 1897 Troponin 0.03 -- <0.03 -- <0.03 -- <0.03 ESR 47 Blood cultures pending 2 Phosphorus 3.4 Magnesium 2.1 HIV antibodies nonreactive Urinalysis with moderate bilirubin, ketones greater than 80, moderate leukocyte esterase Urine drug screen negative Urine culture pending  Imaging:  I have personally and independently reviewed the MRI scan of the brain without contrast from 07/15/16. This shows patchy T2/flair hyperintensity in the right hemispheric white  matter with minimal white matter abnormalities noted in the left cerebral hemisphere. Also noted is abnormal flow void in the right internal carotid artery which could represent occlusion versus slow flow. This scan is  somewhat limited by patient motion. There is no evidence of acute ischemic infarction. There is no evidence of hemorrhage.  Other diagnostic studies:  Carotid Dopplers pending  Assessment and Plan:  1. Left hemiparesis: This appears to be subacute as best I can tell, though it somewhat difficult to get a precise timeliness to when symptoms presented. It is unclear to me if the arm and leg became weak at the same time or if this was a sequential process. He clearly has some give way in the left leg due to pain but I do believe that he has actual weakness underlying this. He has not had an acute stroke based on MRI scan, noticed certainly possible that he has had one in the past which has contributed to these symptoms given obvious evidence of chronic ischemic changes in the right hemispheric white matter. Recommend PT/OT/rehabilitation as needed.  2. Cerebrovascular disease: MRI shows asymmetric chronic ischemic change in the right cerebral hemisphere with abnormal flow void in the right internal carotid artery. This is certainly suspicious for right internal carotid artery stenosis/occlusion. Agree with further evaluation. Carotid Dopplers have been ordered. May need to consider CT angiography of the head and neck as well. Known risk factors for cerebrovascular disease in this patient include age, obesity, hyperlipidemia, and hypertension. Continue with risk factor modification, ensuring normotension, normoglycemia, and LDL less than 70. Will check fasting lipids and hemoglobin A1c. Carotid Dopplers have been ordered as noted below. Will defer echocardiogram at this time as imaging findings are not suggestive of embolic stroke. Initiate aspirin 81 mg daily for secondary prevention. I will also initiate atorvastatin 80 mg daily.  3. Possible right internal carotid artery stenosis: MRI findings show an abnormal flow void in the right internal carotid artery. This needs to be investigated further. Carotid  Dopplers are pending. May need to consider CT angiography of the head and neck for full evaluation. In the meantime, continue risk factor modification, aspirin, statin as noted above.  4. Fall: He had reportedly fallen the day of presentation. It is unclear what led up to this fall. The patient states that he fell asleep at the table and then fell out of his chair. This may have been precipitated by dizziness, suggesting potential for a syncopal event. It does not sound as if there is any seizure activity. At this point, he has left hemiparesis which was certainly predispose him to falls. Family had indicated that the patient had a fall in early March and has not been able to walk since. Physical therapy to evaluate and treat with rehabilitation as needed.  The patient would likely benefit from acute rehab services. Recommend consultation of PT/OT/SLP with consideration for PM&R consult as appropriate depending upon clinical progress and therapists' recommendations.   Fall risk: Risk factors for falls include age, prior history of falls, polypharmacy, baseline cognitive dysfunction, male gender, impaired mobility/gait, visual impairments, and pain. Patient educated on risk of falls and use of call bell. Patient was counseled not to get up without assistance. Strict fall precautions. Limit psychoactive medications and sedating medications.  Delirium risk: Risk factors for delirium include history of dementia/cognitive impairment, history of psychiatric illness, age, pneumonia, medications/polypharmacy, immobility, sensory impairment (decreased vision), urinary catheterization, metabolic derangement, decreased albumin. Continue to optimize metabolic status as you are.  Continue to treat any underlying infection. Minimize the use of opiates, benzos or any medication with strong anticholinergic properties as much as possible. Optimize sleep-wake cycles as much as you can by keeping the room bright with activity  during the day and dark and quiet at night.   This was discussed with the patient. Education was provided on the diagnosis and expected evaluation and treatment. He is in agreement with the plan as noted. He was given the opportunity to ask any questions and these were addressed to his satisfaction.

## 2016-07-16 NOTE — Progress Notes (Signed)
CSW received consult regarding PT recommendation of SNF at discharge.  Patient is refusing SNF. He would like to discharge home with his sister and brother. Patient asked CSW for the name of the PT place near his cancer center- [facility]. CSW called them and provided patient with info.  CSW signing off.   Ronald [name] LCSWA [phone]

## 2016-07-16 NOTE — Progress Notes (Signed)
Pharmacy Antibiotic Note  Ronald [name] is a 71 y.o. male admitted on day # 3 antibiotics for CAP. To change from Ceftriaxone and Azithromycin IV to Levaquin.  PO Levaquin okay per Dr. Carolin Sicks. Improving.   Plan:  Levaquin 500 mg PO daily.  Will follow up renal function, but do not expect any need to adjust dose.  Height: 5\' 9"  (175.3 cm) Weight: 243 lb 9.7 oz (110.5 kg) IBW/kg (Calculated) : 70.7  Temp (24hrs), Avg:98.4 F (36.9 C), Min:98.2 F (36.8 C), Max:98.6 F (37 C)   Recent Labs Lab 07/14/16 1943 07/14/16 1954 07/16/16 0506  WBC  --  9.4 6.1  CREATININE 1.11  --  0.96    Estimated Creatinine Clearance: 87.7 mL/min (by C-G formula based on SCr of 0.96 mg/dL).    Allergies  Allergen Reactions  . Dust Mite Extract Other (See Comments)    Allergy symptoms   . Pollen Extract Other (See Comments)    Allergy symptoms   . Rye Grass Flower Pollen Extract [Gramineae Pollens] Other (See Comments)    Allergy Symptoms     Antimicrobials this admission:  Azithromycin 4/19>>4/21  CTX 4/19>>4/20 (dc'd 4/21 but last dose 4/20 pm)  Levaquin PO 4/21>>  Dose adjustments this admission:  n/a  Microbiology results:  4/19 blood x 2 - sent, no results yet  4/19 urine - negative  4/?? Sputum - no sample yet  4/19 Influenza PCR - negative  4/19 Strep pneumo Ag - negative  Thank you for allowing pharmacy to be a part of this patient's care.  Arty Baumgartner, [place] Pager: [phone] 07/16/2016 3:40 PM

## 2016-07-16 NOTE — Progress Notes (Signed)
PROGRESS NOTE    Ronald [name]  JAS:[id number] DOB: 09-22-1945 DOA: 07/14/2016 PCP: Leeanne Rio, PA-C   Brief Narrative: 71 year old male with history of bipolar mood disorder, prostate cancer, hypertension, dyslipidemia, acid reflux presented with fall, nonproductive cough, fever, congestion and dizziness. Patient reported that he had a fall at home and was on the floor associated with weakness on left lower extremities. He also has worsening dizziness, slurred space and confusion. Patient was found to have pneumonia and abnormal MRI finding.  Assessment & Plan:   # Community-acquired pneumonia: Patient presented with a fever, cough, congestion and x-ray consistent with left lower lobe pneumonia on 4/18. Repeat chest x-ray on 4/20 looks clear. Change abx to Levaquin.  Patient is not hypoxic and clinically improving.  #Left hemiparesis likely subacute vs chronic CVA: Patient with fall and left hemiparesis. MRI of the brain showed asymmetric chronic ischemic changes. I discussed with Dr. Shon Hale from neurologist for the consult. The right internal carotid artery has stenosis therefore I ordered carotid Doppler for further evaluation. Check lipid panel and A1c level. -Currently on aspirin, Lipitor -PT, OT evaluation -Further imaging testing and evaluation as per neurologist.  # Left knee osteoarthritis changes: Continue supportive care, PT OT evaluation and pain management.  #Essential hypertension: Continue home medication. Monitor blood pressure closely.  #History of prostate cancer: Outpatient follow-up with urology recommended.  #Asymptomatic bacteriuria: Follow up culture results. Denies urinary symptoms.  #Rhabdomyolysis, nontraumatic: Likely in the setting of fall. Patient reported that he was probably on the ground for some time, exact duration unknown. Continue IV fluid. CK level trending down. Mild elevation in AST is likely due to rhabdomyolysis.  #History of bipolar  mood disorder: Continue home medication, Abilify.   Principal Problem:   CAP (community acquired pneumonia) Active Problems:   Hyperlipidemia   GERD (gastroesophageal reflux disease)   Bipolar 1 disorder (HCC)   Essential hypertension   Prostate cancer ([place])   Acute left-sided weakness   Abnormal urinalysis  Elevated AST (SGOT)   Elevated troponin   DVT prophylaxis: Lovenox subcutaneous Code Status: Full code Family Communication: No family present bedside Disposition Plan: Likely discharge home versus rehabilitation in 1-2 days    Consultants:   Neurologist  Procedures: None Antimicrobials: Ceftriaxone and azithromycin 4/19-4/20 Levaquin 4/20  Subjective: Patient was seen and examined at bedside. He reported weakness and had fall at home. Denied headache, dizziness, nausea, vomiting, chest pressure shortness of breath.  Objective: Vitals:   07/15/16 2122 07/16/16 0613 07/16/16 0614 07/16/16 0948  BP: (!) 107/53  (!) 125/56 131/60  Pulse: 82  71 79  Resp: 18  18   Temp: 98.6 F (37 C)  98.5 F (36.9 C)   TempSrc: Oral     SpO2: 98%  96%   Weight:  110.5 kg (243 lb 9.7 oz)    Height:        Intake/Output Summary (Last 24 hours) at 07/16/16 1449 Last data filed at 07/16/16 4193  Gross per 24 hour  Intake             1320 ml  Output              200 ml  Net             1120 ml   Filed Weights   07/14/16 1930 07/15/16 0328 07/16/16 0613  Weight: 108.9 kg (240 lb) 112.8 kg (248 lb 9.6 oz) 110.5 kg (243 lb 9.7 oz)    Examination:  General exam:  Appears calm and comfortable  Respiratory system: Clear to auscultation. Respiratory effort normal. No wheezing or crackle Cardiovascular system: S1 & S2 heard, RRR.  No pedal edema. Gastrointestinal system: Abdomen is nondistended, soft and nontender. Normal bowel sounds heard. Central nervous system: Alert and oriented.  Extremities: Weakness in left lower extremity. Skin: No rashes, lesions or  ulcers Psychiatry: Judgement and insight appear normal. Mood & affect appropriate.     Data Reviewed: I have personally reviewed following labs and imaging studies  CBC:  Recent Labs Lab 07/14/16 1954 07/16/16 0506  WBC 9.4 6.1  NEUTROABS 7.5  --   HGB 11.9* 10.1*  HCT 37.0* 33.1*  MCV 90.9 91.7  PLT 276 161   Basic Metabolic Panel:  Recent Labs Lab 07/14/16 1943 07/15/16 0827 07/16/16 0506  NA 134*  --  135  K 4.2  --  4.1  CL 97*  --  101  CO2 24  --  26  GLUCOSE 108*  --  105*  BUN 21*  --  17  CREATININE 1.11  --  0.96  CALCIUM 9.7  --  8.7*  MG  --  2.1  --   PHOS  --  3.4  --    GFR: Estimated Creatinine Clearance: 87.7 mL/min (by C-G formula based on SCr of 0.96 mg/dL). Liver Function Tests:  Recent Labs Lab 07/14/16 1943 07/16/16 0506  AST 106* 54*  ALT 38 30  ALKPHOS 84 67  BILITOT 0.7 0.7  PROT 7.5 5.9*  ALBUMIN 3.7 2.7*   No results for input(s): LIPASE, AMYLASE in the last 168 hours. No results for input(s): AMMONIA in the last 168 hours. Coagulation Profile: No results for input(s): INR, PROTIME in the last 168 hours. Cardiac Enzymes:  Recent Labs Lab 07/14/16 1943 07/15/16 1241 07/15/16 1612 07/15/16 2200 07/16/16 0506  CKTOTAL  --  3,148*  --   --  1,897*  TROPONINI 0.03* <0.03 <0.03 <0.03  --    BNP (last 3 results) No results for input(s): PROBNP in the last 8760 hours. HbA1C: No results for input(s): HGBA1C in the last 72 hours. CBG:  Recent Labs Lab 07/14/16 1945  GLUCAP 105*   Lipid Profile: No results for input(s): CHOL, HDL, LDLCALC, TRIG, CHOLHDL, LDLDIRECT in the last 72 hours. Thyroid Function Tests:  Recent Labs  07/15/16 0827  TSH 1.142   Anemia Panel: No results for input(s): VITAMINB12, FOLATE, FERRITIN, TIBC, IRON, RETICCTPCT in the last 72 hours. Sepsis Labs: No results for input(s): PROCALCITON, LATICACIDVEN in the last 168 hours.  No results found for this or any previous visit (from the  past 240 hour(s)).       Radiology Studies: Dg Chest 2 View  Result Date: 07/16/2016 CLINICAL DATA:  Followup pneumonia.  Asymptomatic, but has dementia EXAM: CHEST  2 VIEW COMPARISON:  07/14/2016 FINDINGS: Mild cardiomegaly. Mediastinal contours are distorted by rightward rotation. No acute infiltrate or edema. No effusion or pneumothorax. No acute osseous findings. IMPRESSION: Lungs appear clear today.  No evidence of pneumonia. Electronically Signed   By: Monte Fantasia M.D.   On: 07/16/2016 08:38   Dg Chest 2 View  Result Date: 07/14/2016 CLINICAL DATA:  Persistent cough. The cough is intermittently productive. EXAM: CHEST  2 VIEW COMPARISON:  Chest and rib radiographs 07/16/2015 FINDINGS: The heart size is exaggerated by low lung volumes. Left lower lobe pneumonia is present. The right lung is clear. A sclerotic changes are noted at the aortic arch. There is no significant effusion  or edema. The visualized soft tissues and bony thorax are unremarkable. IMPRESSION: 1. Left lower lobe pneumonia. 2. Atherosclerosis of the aorta. Electronically Signed   By: San Morelle M.D.   On: 07/14/2016 21:53   Dg Knee 1-2 Views Left  Result Date: 07/15/2016 CLINICAL DATA:  Onset of left knee pain yesterday with stiffness. Improved range of motion today. No known injury. EXAM: LEFT KNEE - 1-2 VIEW COMPARISON:  None in PACs FINDINGS: The bones are subjectively adequately mineralized. There is beaking of the tibial spines. Small spurs arise from the articular margins of the femoral condyles and tibial plateaus. There are spurs associated with the superior and inferior articular margins of the patella. There is no acute fracture or dislocation. There is no joint effusion. IMPRESSION: There are mild osteoarthritic changes of all 3 joint compartments. No acute fracture or dislocation. No significant joint space loss. Electronically Signed   By: David  Martinique M.D.   On: 07/15/2016 10:49   Ct Head Wo  Contrast  Result Date: 07/14/2016 CLINICAL DATA:  Increased dizziness and slurred speech today. [place] out of chair twice. Initial encounter. EXAM: CT HEAD WITHOUT CONTRAST TECHNIQUE: Contiguous axial images were obtained from the base of the skull through the vertex without intravenous contrast. COMPARISON:  None. FINDINGS: Brain: Mild atrophy and white matter changes are present bilaterally. No acute infarct, hemorrhage, or mass lesion is present. The basal ganglia are intact. Insular ribbon is normal. No focal cortical defect is evident. The ventricles are proportionate to the degree of atrophy. No significant extra-axial fluid collection is present. Incidental calcifications are noted at the ideal gland. Vascular: Negative Skull: Calvarium is intact. No focal lytic or blastic lesions are present. Sinuses/Orbits: The paranasal sinuses an mastoid air cells are clear. The globes and orbits are unremarkable. IMPRESSION: 1. Mild age advanced atrophy and white matter disease likely reflects the sequela of chronic microvascular ischemia. 2. No acute intracranial abnormality. Electronically Signed   By: San Morelle M.D.   On: 07/14/2016 21:57   Mr Brain Wo Contrast  Result Date: 07/15/2016 CLINICAL DATA:  Left-sided weakness.  History of prostate cancer. EXAM: MRI HEAD WITHOUT CONTRAST TECHNIQUE: Multiplanar, multiecho pulse sequences of the brain and surrounding structures were obtained without intravenous contrast. COMPARISON:  Head CT from yesterday FINDINGS: Brain: No acute infarction, hemorrhage, hydrocephalus, extra-axial collection or mass lesion. Asymmetric white matter gliosis roughly aligned along the right lateral ventricle. The right lateral ventricle is slightly larger than the left, see coronal T2 acquisition. Few small remote infarcts in the peripheral right cerebellum. Vascular: Asymmetric abnormal signal in the right ICA, suspect stenosis and slow flow. Skull and upper cervical spine:  Negative Sinuses/Orbits: Negative Other: Motion degraded study which could obscure pathology. IMPRESSION: 1. No acute finding. 2. Asymmetric gliosis in the right cerebral white matter, deep watershed pattern. Asymmetric abnormal appearance of the right ICA, suspect chronic flow limiting stenosis. Electronically Signed   By: Monte Fantasia M.D.   On: 07/15/2016 13:32   Dg Hip Unilat W Or Wo Pelvis 2-3 Views Left  Result Date: 07/15/2016 CLINICAL DATA:  Left hip pain after a fall last night. EXAM: DG HIP (WITH OR WITHOUT PELVIS) 2-3V LEFT COMPARISON:  None. FINDINGS: There is no evidence of hip fracture or dislocation. There is no evidence of arthropathy or other focal bone abnormality. IMPRESSION: Negative. Electronically Signed   By: Lucienne Capers M.D.   On: 07/15/2016 02:48        Scheduled Meds: . ARIPiprazole  10  mg Oral Daily  . aspirin EC  81 mg Oral Daily  . atorvastatin  80 mg Oral q1800  . calcium-vitamin D  1 tablet Oral Q breakfast  . cloNIDine  0.1 mg Oral TID  . enoxaparin (LOVENOX) injection  40 mg Subcutaneous Q24H  . famotidine  20 mg Oral Daily  . finasteride  5 mg Oral Daily  . fluticasone  2 spray Each Nare Daily  . lisinopril  30 mg Oral Daily  . metoprolol  50 mg Oral BID   Continuous Infusions: . sodium chloride 75 mL/hr at 07/16/16 0032  . azithromycin 500 mg (07/16/16 0032)  . cefTRIAXone (ROCEPHIN)  IV 1 g (07/15/16 2334)     LOS: 1 day    Whitt Auletta Tanna Furry, MD [facility] Pager [phone]  If 7PM-7AM, please contact night-coverage [email or web address] Password [facility] 07/16/2016, 2:49 PM

## 2016-07-17 ENCOUNTER — Inpatient Hospital Stay (HOSPITAL_COMMUNITY): Payer: Medicare Other

## 2016-07-17 DIAGNOSIS — R55 Syncope and collapse: Secondary | ICD-10-CM

## 2016-07-17 LAB — LIPID PANEL
CHOLESTEROL: 146 mg/dL (ref 0–200)
HDL: 40 mg/dL — ABNORMAL LOW (ref >40)
LDL Cholesterol: 86 mg/dL (ref 0–99)
TRIGLYCERIDES: 100 mg/dL (ref <150)
Total CHOL/HDL Ratio: 3.7 RATIO
VLDL: 20 mg/dL (ref 0–40)

## 2016-07-17 LAB — BASIC METABOLIC PANEL
Anion gap: 6 (ref 5–15)
BUN: 12 mg/dL (ref 6–20)
CHLORIDE: 105 mmol/L (ref 101–111)
CO2: 25 mmol/L (ref 22–32)
Calcium: 8.7 mg/dL — ABNORMAL LOW (ref 8.9–10.3)
Creatinine, Ser: 0.81 mg/dL (ref 0.61–1.24)
GFR calc Af Amer: >60 mL/min (ref >60)
GLUCOSE: 104 mg/dL — AB (ref 65–99)
POTASSIUM: 4.1 mmol/L (ref 3.5–5.1)
Sodium: 136 mmol/L (ref 135–145)

## 2016-07-17 LAB — CK: Total CK: 1240 U/L — ABNORMAL HIGH (ref 49–397)

## 2016-07-17 LAB — LEGIONELLA PNEUMOPHILA SEROGP 1 UR AG: L. PNEUMOPHILA SEROGP 1 UR AG: NEGATIVE

## 2016-07-17 LAB — HEMOGLOBIN A1C
HEMOGLOBIN A1C: 5.4 % (ref 4.8–5.6)
Mean Plasma Glucose: 108 mg/dL

## 2016-07-17 NOTE — Progress Notes (Signed)
PROGRESS NOTE    Ronald [name]  POE:[id number] DOB: 24-Feb-1946 DOA: 07/14/2016 PCP: Leeanne Rio, PA-C   Brief Narrative: 71 year old male with history of bipolar mood disorder, prostate cancer, hypertension, dyslipidemia, acid reflux presented with fall, nonproductive cough, fever, congestion and dizziness. Patient reported that he had a fall at home and was on the floor associated with weakness on left lower extremities. He also has worsening dizziness, slurred space and confusion. Patient was found to have pneumonia and abnormal MRI finding.  Assessment & Plan:   # Community-acquired pneumonia: Patient presented with a fever, cough, congestion and x-ray consistent with left lower lobe pneumonia on 4/18. Repeat chest x-ray on 4/20 looks clear. Changed abx to Levaquin, may continue for total 5 days.  Patient is not hypoxic and clinically improving.   #Left hemiparesis likely subacute vs chronic CVA: Patient with fall and left hemiparesis. MRI of the brain showed asymmetric chronic ischemic changes. Neurology consult requested and consult appreciated. The right internal carotid artery has stenosis therefore I ordered carotid Doppler for further evaluation.  -LDL 86, A1c 5.4. Continue aspirin and Lipitor. -PT, OT evaluation recommended SNF however patient declined. He is willing to go home with home care services. -Further imaging testing and evaluation as per neurologist. As per neurology note echocardiogram is deferred at this time  # Left knee osteoarthritis changes: Continue supportive care, PT OT evaluation and pain management.  #Essential hypertension: Continue home medication. Monitor blood pressure closely.  #History of prostate cancer: Outpatient follow-up with urology recommended.  #Asymptomatic bacteriuria: Urine culture negative. Denies urinary symptoms.  #Rhabdomyolysis, nontraumatic: Likely in the setting of fall. Patient reported that he was probably on the ground  for some time, exact duration unknown. Continue IV fluid. CK level trending down to 1240 today. Mild elevation in AST is likely due to rhabdomyolysis. -Currently on normal saline @ 75 mL an hour.  #History of bipolar mood disorder: Continue home medication, Abilify.   Principal Problem:   CAP (community acquired pneumonia) Active Problems:   Hyperlipidemia   GERD (gastroesophageal reflux disease)   Bipolar 1 disorder (HCC)   Essential hypertension   Prostate cancer (HCC)   Acute left-sided weakness   Abnormal urinalysis  Elevated AST (SGOT)   Elevated troponin   DVT prophylaxis: Lovenox subcutaneous Code Status: Full code Family Communication: No family present bedside Disposition Plan: Likely discharge home with home care services in 1-2 days.    Consultants:   Neurologist  Procedures: None Antimicrobials: Ceftriaxone and azithromycin 4/19-4/20 Levaquin 4/20  Subjective: Patient was seen and examined at bedside. Denied headache, dizziness, nausea, vomiting, chest pain or shortness of breath. No cough  Objective: Vitals:   07/16/16 1509 07/16/16 2234 07/17/16 0300 07/17/16 0604  BP: (!) 138/54 139/63  (!) 126/53  Pulse: 79 72  63  Resp: 18 18  18   Temp: 98.2 F (36.8 C) 98.6 F (37 C)  98.6 F (37 C)  TempSrc:      SpO2: 95% 100%  98%  Weight:   113.6 kg (250 lb 7.1 oz)   Height:        Intake/Output Summary (Last 24 hours) at 07/17/16 1229 Last data filed at 07/17/16 0900  Gross per 24 hour  Intake          1821.25 ml  Output             1450 ml  Net           371.25 ml   [employer]  07/15/16 0328 07/16/16 0613 07/17/16 0300  Weight: 112.8 kg (248 lb 9.6 oz) 110.5 kg (243 lb 9.7 oz) 113.6 kg (250 lb 7.1 oz)    Examination:  General exam: Not in distress  Respiratory system: Clear bilateral. Respiratory effort normal. No wheezing or crackle Cardiovascular system: S1 & S2 heard, RRR.  No pedal edema. Gastrointestinal system: Abdomen is  nondistended, soft and nontender. Normal bowel sounds heard. Central nervous system: Alert and oriented.  Extremities: Weakness in left lower extremity, unchanged Skin: No rashes, lesions or ulcers Psychiatry: Judgement and insight appear normal. Mood & affect appropriate.     Data Reviewed: I have personally reviewed following labs and imaging studies  CBC:  Recent Labs Lab 07/14/16 1954 07/16/16 0506  WBC 9.4 6.1  NEUTROABS 7.5  --   HGB 11.9* 10.1*  HCT 37.0* 33.1*  MCV 90.9 91.7  PLT 276 154   Basic Metabolic Panel:  Recent Labs Lab 07/14/16 1943 07/15/16 0827 07/16/16 0506 07/17/16 0743  NA 134*  --  135 136  K 4.2  --  4.1 4.1  CL 97*  --  101 105  CO2 24  --  26 25  GLUCOSE 108*  --  105* 104*  BUN 21*  --  17 12  CREATININE 1.11  --  0.96 0.81  CALCIUM 9.7  --  8.7* 8.7*  MG  --  2.1  --   --   PHOS  --  3.4  --   --    GFR: Estimated Creatinine Clearance: 105.5 mL/min (by C-G formula based on SCr of 0.81 mg/dL). Liver Function Tests:  Recent Labs Lab 07/14/16 1943 07/16/16 0506  AST 106* 54*  ALT 38 30  ALKPHOS 84 67  BILITOT 0.7 0.7  PROT 7.5 5.9*  ALBUMIN 3.7 2.7*   No results for input(s): LIPASE, AMYLASE in the last 168 hours. No results for input(s): AMMONIA in the last 168 hours. Coagulation Profile: No results for input(s): INR, PROTIME in the last 168 hours. Cardiac Enzymes:  Recent Labs Lab 07/14/16 1943 07/15/16 1241 07/15/16 1612 07/15/16 2200 07/16/16 0506 07/17/16 0743  CKTOTAL  --  3,148*  --   --  1,897* 1,240*  TROPONINI 0.03* <0.03 <0.03 <0.03  --   --    BNP (last 3 results) No results for input(s): PROBNP in the last 8760 hours. HbA1C:  Recent Labs  07/16/16 1017  HGBA1C 5.4   CBG:  Recent Labs Lab 07/14/16 1945  GLUCAP 105*   Lipid Profile:  Recent Labs  07/17/16 0743  CHOL 146  HDL 40*  LDLCALC 86  TRIG 100  CHOLHDL 3.7   Thyroid Function Tests:  Recent Labs  07/15/16 0827  TSH  1.142   Anemia Panel: No results for input(s): VITAMINB12, FOLATE, FERRITIN, TIBC, IRON, RETICCTPCT in the last 72 hours. Sepsis Labs: No results for input(s): PROCALCITON, LATICACIDVEN in the last 168 hours.  Recent Results (from the past 240 hour(s))  Urine culture     Status: None   Collection Time: 07/15/16  8:14 AM  Result Value Ref Range Status   Specimen Description URINE, CLEAN CATCH  Final   Special Requests NONE  Final   Culture NO GROWTH  Final   Report Status 07/16/2016 FINAL  Final  Culture, blood (routine x 2) Call MD if unable to obtain prior to antibiotics being given     Status: None (Preliminary result)   Collection Time: 07/15/16  8:27 AM  Result Value Ref Range  Status   Specimen Description BLOOD RIGHT ARM  Final   Special Requests IN PEDIATRIC BOTTLE Blood Culture adequate volume  Final   Culture NO GROWTH 2 DAYS  Final   Report Status PENDING  Incomplete  Culture, blood (routine x 2) Call MD if unable to obtain prior to antibiotics being given     Status: None (Preliminary result)   Collection Time: 07/15/16  8:27 AM  Result Value Ref Range Status   Specimen Description BLOOD RIGHT HAND  Final   Special Requests IN PEDIATRIC BOTTLE Blood Culture adequate volume  Final   Culture NO GROWTH 2 DAYS  Final   Report Status PENDING  Incomplete         Radiology Studies: Dg Chest 2 View  Result Date: 07/16/2016 CLINICAL DATA:  Followup pneumonia.  Asymptomatic, but has dementia EXAM: CHEST  2 VIEW COMPARISON:  07/14/2016 FINDINGS: Mild cardiomegaly. Mediastinal contours are distorted by rightward rotation. No acute infiltrate or edema. No effusion or pneumothorax. No acute osseous findings. IMPRESSION: Lungs appear clear today.  No evidence of pneumonia. Electronically Signed   By: Monte Fantasia M.D.   On: 07/16/2016 08:38        Scheduled Meds: . ARIPiprazole  10 mg Oral Daily  . aspirin EC  81 mg Oral Daily  . atorvastatin  80 mg Oral q1800  .  calcium-vitamin D  1 tablet Oral Q breakfast  . cloNIDine  0.1 mg Oral TID  . enoxaparin (LOVENOX) injection  40 mg Subcutaneous Q24H  . famotidine  20 mg Oral Daily  . finasteride  5 mg Oral Daily  . fluticasone  2 spray Each Nare Daily  . levofloxacin  500 mg Oral Daily  . lisinopril  30 mg Oral Daily  . metoprolol  50 mg Oral BID   Continuous Infusions: . sodium chloride 75 mL/hr at 07/17/16 0526     LOS: 2 days    Leonid Manus Tanna Furry, MD [facility] Pager [phone]  If 7PM-7AM, please contact night-coverage [email or web address] Password [facility] 07/17/2016, 12:29 PM

## 2016-07-17 NOTE — Progress Notes (Addendum)
VASCULAR LAB PRELIMINARY  PRELIMINARY  PRELIMINARY  PRELIMINARY  Carotid duplex completed.    Preliminary report:  The right proximal ICA exhibits a "thumping" signal which may be consistent with a more distal occlusion.  Unable to visualize the distal ICA secondary to high bifurcation and short neck.  There is 40-59% left ICA stenosis, proximally. Unable to visualize distal ICA secondary to tortuosity and high bifurcation. Bilateral vertebral artery flow is antegrade.   Safaa Stingley, RVT 07/17/2016, 6:25 PM

## 2016-07-18 ENCOUNTER — Encounter (HOSPITAL_COMMUNITY): Payer: Self-pay | Admitting: Radiology

## 2016-07-18 ENCOUNTER — Inpatient Hospital Stay (HOSPITAL_COMMUNITY): Payer: Medicare Other

## 2016-07-18 DIAGNOSIS — F319 Bipolar disorder, unspecified: Secondary | ICD-10-CM

## 2016-07-18 LAB — CK
CK TOTAL: 819 U/L — AB (ref 49–397)
CK TOTAL: 788 U/L — AB (ref 49–397)

## 2016-07-18 MED ORDER — ENOXAPARIN SODIUM 60 MG/0.6ML ~~LOC~~ SOLN
60.0000 mg | SUBCUTANEOUS | Status: DC
  Administered 2016-07-19: 60 mg via SUBCUTANEOUS
  Filled 2016-07-18: qty 0.6

## 2016-07-18 MED ORDER — IOPAMIDOL (ISOVUE-370) INJECTION 76%
INTRAVENOUS | Status: AC
  Administered 2016-07-18: 50 mL
  Filled 2016-07-18: qty 50

## 2016-07-18 NOTE — Progress Notes (Signed)
Pharmacy: Lovenox Dose Adjustment for VTE prophylaxis  OBJECTIVE:  Wt: 114   Ht: 69 inches  BMI~36 SCr 0.81  CrCl>80 ml/min  ASSESSMENT:  71 YOM on lovenox for VTE prophylaxis requiring a dose adjustment for BMI>30 and CrCl>30 ml/min  PLAN:  1. Adjust Lovenox to 60 mg (~0.5 mg/kg) SQ every 24 hours 2. Pharmacy will monitor peripherally for s/sx of bleeding and any necessary dose adjustments   Thank you for allowing pharmacy to be a part of this patient's care.  Alycia Rossetti, PharmD, BCPS Clinical Pharmacist 07/18/2016 12:08 PM

## 2016-07-18 NOTE — Progress Notes (Signed)
PROGRESS NOTE    Witten Certain  MBW:[id number] DOB: 11/01/1945 DOA: 07/14/2016 PCP: Leeanne Rio, PA-C    Brief Narrative:  71 yo male presented to the hospital with the chief complain of ambulatory dysfunction, fever, congestion and left knee pain. Know to have dementia and bipolar. Multiple falls at home with right sided weakness. On the initial physical examination, hemodynamically stable with evident left sided weakness. Chest film positive for infiltrate. Admitted with community acquired pneumonia complicated with possible CVA. MRI with ischemic changes.    Assessment & Plan:   Principal Problem:   CAP (community acquired pneumonia) Active Problems:   Hyperlipidemia   GERD (gastroesophageal reflux disease)   Bipolar 1 disorder (HCC)   Essential hypertension   Prostate cancer ([place])   Dementia   Acute left-sided weakness   Abnormal urinalysis   Effusion of left knee   Elevated AST (SGOT)   Elevated troponin   Lobar pneumonia (HCC)   Non-traumatic rhabdomyolysis   1. Community acquired pneumonia. Follow chest film with improvement on infiltrate, patient with no dyspnea or chest pain, wbc at 6,1, cultures no growth. Will plan to complete levofloxacin on 04/23, to complete 5 days.   2. 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 Will continue asa, atorvastatin, continue blood pressure control. Physical therapy evaluation. Carotid US pending, MRI with asymmetric gliosis on the right cerebral white matter. Patient may need clopidogrel, will follow on final neurology recommendations. Patient has chronic left hemiparesis.   3. HTN. Will continue clonidine, lisinopril and metoprolol for blood pressure control.  Blood pressure 130 to 160.   4. Rhabdomyolysis. CPK trending down, renal function preserved with cr at 0.81, patient tolerating po well, will hold on IV fluids and follow on cpk in am.   5. Left knee osteoarthritis. Continue pain control  and physical therapy evaluation.   6. Dementia/ bipolar. No agitation or confusion, continue neuro checks per unit protocol. Continue abilify.   DVT prophylaxis: enoxaparin  Code Status: full  Family Communication: No family at the bedside  Disposition Plan: home    Consultants:   Neurology   Procedures:    Antimicrobials:     Subjective: Patient feeling better, continue to have left hemiparesis, no nausea or vomiting, tolerating po well, no dyspnea or chest pain.   Objective: Vitals:   07/17/16 2158 07/18/16 0637 07/18/16 0939 07/18/16 1239  BP: (!) 148/61 (!) 162/68 (!) 144/63 133/61  Pulse: 65 65  62  Resp: 18 18  18   Temp: 98.4 F (36.9 C) 98.7 F (37.1 C)  98.2 F (36.8 C)  TempSrc: Oral Oral  Oral  SpO2: 98% 99%  98%  Weight:  114 kg (251 lb 5.2 oz)    Height:        Intake/Output Summary (Last 24 hours) at 07/18/16 1241 Last data filed at 07/18/16 0800  Gross per 24 hour  Intake           2122.5 ml  Output             1150 ml  Net            972.5 ml   Filed Weights   07/16/16 0613 07/17/16 0300 07/18/16 0637  Weight: 110.5 kg (243 lb 9.7 oz) 113.6 kg (250 lb 7.1 oz) 114 kg (251 lb 5.2 oz)    Examination:  General exam: not in pain or dyspnea E ENT: mild pallor, oral mucosa moist Respiratory system: Clear to auscultation. Respiratory effort normal. No  wheezing, rales or rhonchi.  Cardiovascular system: S1 & S2 heard, RRR. No JVD, murmurs, rubs, gallops or clicks. No pedal edema. Gastrointestinal system: Abdomen is nondistended, soft and nontender. No organomegaly or masses felt. Normal bowel sounds heard. Central nervous system: Alert and oriented. No focal neurological deficits.  Extremities: Left hemiparesis, decreased hand grip on the left, unable to move left leg against gravity.  Skin: No rashes, lesions or ulcers   Data Reviewed: I have personally reviewed following labs and imaging studies  CBC:  Recent Labs Lab 07/14/16 1954  07/16/16 0506  WBC 9.4 6.1  NEUTROABS 7.5  --   HGB 11.9* 10.1*  HCT 37.0* 33.1*  MCV 90.9 91.7  PLT 276 093   Basic Metabolic Panel:  Recent Labs Lab 07/14/16 1943 07/15/16 0827 07/16/16 0506 07/17/16 0743  NA 134*  --  135 136  K 4.2  --  4.1 4.1  CL 97*  --  101 105  CO2 24  --  26 25  GLUCOSE 108*  --  105* 104*  BUN 21*  --  17 12  CREATININE 1.11  --  0.96 0.81  CALCIUM 9.7  --  8.7* 8.7*  MG  --  2.1  --   --   PHOS  --  3.4  --   --    GFR: Estimated Creatinine Clearance: 105.6 mL/min (by C-G formula based on SCr of 0.81 mg/dL). Liver Function Tests:  Recent Labs Lab 07/14/16 1943 07/16/16 0506  AST 106* 54*  ALT 38 30  ALKPHOS 84 67  BILITOT 0.7 0.7  PROT 7.5 5.9*  ALBUMIN 3.7 2.7*   No results for input(s): LIPASE, AMYLASE in the last 168 hours. No results for input(s): AMMONIA in the last 168 hours. Coagulation Profile: No results for input(s): INR, PROTIME in the last 168 hours. Cardiac Enzymes:  Recent Labs Lab 07/14/16 1943 07/15/16 1241 07/15/16 1612 07/15/16 2200 07/16/16 0506 07/17/16 0743 07/18/16 0710  CKTOTAL  --  3,148*  --   --  1,897* 1,240* 819*  TROPONINI 0.03* <0.03 <0.03 <0.03  --   --   --    BNP (last 3 results) No results for input(s): PROBNP in the last 8760 hours. HbA1C:  Recent Labs  07/16/16 1017  HGBA1C 5.4   CBG:  Recent Labs Lab 07/14/16 1945  GLUCAP 105*   Lipid Profile:  Recent Labs  07/17/16 0743  CHOL 146  HDL 40*  LDLCALC 86  TRIG 100  CHOLHDL 3.7   Thyroid Function Tests: No results for input(s): TSH, T4TOTAL, FREET4, T3FREE, THYROIDAB in the last 72 hours. Anemia Panel: No results for input(s): VITAMINB12, FOLATE, FERRITIN, TIBC, IRON, RETICCTPCT in the last 72 hours. Sepsis Labs: No results for input(s): PROCALCITON, LATICACIDVEN in the last 168 hours.  Recent Results (from the past 240 hour(s))  Urine culture     Status: None   Collection Time: 07/15/16  8:14 AM  Result  Value Ref Range Status   Specimen Description URINE, CLEAN CATCH  Final   Special Requests NONE  Final   Culture NO GROWTH  Final   Report Status 07/16/2016 FINAL  Final  Culture, blood (routine x 2) Call MD if unable to obtain prior to antibiotics being given     Status: None (Preliminary result)   Collection Time: 07/15/16  8:27 AM  Result Value Ref Range Status   Specimen Description BLOOD RIGHT ARM  Final   Special Requests IN PEDIATRIC BOTTLE Blood Culture adequate volume  Final   Culture NO GROWTH 2 DAYS  Final   Report Status PENDING  Incomplete  Culture, blood (routine x 2) Call MD if unable to obtain prior to antibiotics being given     Status: None (Preliminary result)   Collection Time: 07/15/16  8:27 AM  Result Value Ref Range Status   Specimen Description BLOOD RIGHT HAND  Final   Special Requests IN PEDIATRIC BOTTLE Blood Culture adequate volume  Final   Culture NO GROWTH 2 DAYS  Final   Report Status PENDING  Incomplete         Radiology Studies: Ct Angio Head W Or Wo Contrast  Result Date: 07/18/2016 CLINICAL DATA:  Acute left hemiparesis. EXAM: CT ANGIOGRAPHY HEAD AND NECK TECHNIQUE: Multidetector CT imaging of the head and neck was performed using the standard protocol during bolus administration of intravenous contrast. Multiplanar CT image reconstructions and MIPs were obtained to evaluate the vascular anatomy. Carotid stenosis measurements (when applicable) are obtained utilizing NASCET criteria, using the distal internal carotid diameter as the denominator. CONTRAST:  50 mL Isovue 370 COMPARISON:  Brain MRI 07/15/2016 and CT 07/14/2016. No prior angiographic imaging. FINDINGS: CT HEAD FINDINGS Brain: There is no evidence of acute cortical infarct, intracranial hemorrhage, mass, midline shift, or extra-axial fluid collection. Mild right cerebral white matter hypoattenuation is again noted predominantly at the level of the centrum semiovale corresponding to the  watershed pattern gliosis described on MRI. There is slight enlargement of the right lateral ventricle. Vascular: Calcified atherosclerosis at the skullbase. Skull: No fracture or focal osseous lesion. Sinuses: Minimal right maxillary sinus mucosal thickening. Clear mastoid air cells. Orbits: Unremarkable orbits. Review of the MIP images confirms the above findings CTA NECK FINDINGS Aortic arch: Standard 3 vessel aortic arch with mild-to-moderate calcified and soft plaque. Brachiocephalic and subclavian arteries are widely patent. Right carotid system: Widely patent common carotid artery with mild atherosclerotic luminal irregularity. Widely patent ICA origin with mild calcified plaque. The ICA abruptly tapers in caliber beyond the bulb and is patent but diffusely small throughout the remainder of the neck without focal stenosis or dissection flap identified. This likely reflects decreased flow due to the distal occlusion, although an underlying chronic dissection is not excluded. Left carotid system: Patent without evidence of stenosis or dissection. Mild soft plaque in the bulb. Vertebral arteries: The left vertebral artery is patent without evidence of dissection or significant stenosis. The right vertebral artery is occluded at its origin. There is distal reconstitution of diminutive distal V2 and V3 segments at the C2-3 level. Skeleton: Mild cervical spondylosis. Other neck: Poor dentition with multiple caries and periapical lucencies. No neck mass. Upper chest: Clear lung apices. Review of the MIP images confirms the above findings CTA HEAD FINDINGS Anterior circulation: The intracranial right ICA is patent in the petrous and cavernous segments but occludes at the ophthalmic level. There is distal reconstitution via the right posterior communicating artery. The intracranial left ICA is patent with mild atherosclerotic plaque resulting in mild proximal cavernous stenosis. ACAs are patent with a severe origin  stenosis noted on the right. MCAs are patent with mild-to-moderate branch vessel irregularity but no evidence of proximal branch occlusion or significant proximal stenosis. Moderate ACA branch vessel irregularity is also noted. No aneurysm. Posterior circulation: The intracranial left vertebral artery is patent with mild atherosclerotic irregularity but no significant stenosis. The intracranial right vertebral artery is grossly patent but extremely small in caliber. PICAs and SCAs are grossly patent proximally. The basilar artery is  patent and mildly irregular without significant stenosis. There are right larger than left posterior communicating artery is with both P1 segments appearing mildly hypoplastic. There is a moderate to severe distal right P1 stenosis. Left PCA is patent without significant proximal stenosis. No aneurysm. Venous sinuses: Patent. Anatomic variants: None. Delayed phase: No abnormal enhancement. Review of the MIP images confirms the above findings IMPRESSION: 1. 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 Distal reconstitution via the posterior communicating artery. 2. Patent right MCA without significant proximal stenosis. 3. Severe right ACA origin stenosis. 4. Severe distal right P1 PCA stenosis. 5. 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 Electronically Signed   By: Logan Bores M.D.   On: 07/18/2016 12:25   Ct Angio Neck W Or Wo Contrast  Result Date: 07/18/2016 CLINICAL DATA:  Acute left hemiparesis. EXAM: CT ANGIOGRAPHY HEAD AND NECK TECHNIQUE: Multidetector CT imaging of the head and neck was performed using the standard protocol during bolus administration of intravenous contrast. Multiplanar CT image reconstructions and MIPs were obtained to evaluate the vascular anatomy. Carotid stenosis measurements (when applicable) are obtained utilizing NASCET criteria, using the distal internal carotid diameter as the  denominator. CONTRAST:  50 mL Isovue 370 COMPARISON:  Brain MRI 07/15/2016 and CT 07/14/2016. No prior angiographic imaging. FINDINGS: CT HEAD FINDINGS Brain: There is no evidence of acute cortical infarct, intracranial hemorrhage, mass, midline shift, or extra-axial fluid collection. Mild right cerebral white matter hypoattenuation is again noted predominantly at the level of the centrum semiovale corresponding to the watershed pattern gliosis described on MRI. There is slight enlargement of the right lateral ventricle. Vascular: Calcified atherosclerosis at the skullbase. Skull: No fracture or focal osseous lesion. Sinuses: Minimal right maxillary sinus mucosal thickening. Clear mastoid air cells. Orbits: Unremarkable orbits. Review of the MIP images confirms the above findings CTA NECK FINDINGS Aortic arch: Standard 3 vessel aortic arch with mild-to-moderate calcified and soft plaque. Brachiocephalic and subclavian arteries are widely patent. Right carotid system: Widely patent common carotid artery with mild atherosclerotic luminal irregularity. Widely patent ICA origin with mild calcified plaque. The ICA abruptly tapers in caliber beyond the bulb and is patent but diffusely small throughout the remainder of the neck without focal stenosis or dissection flap identified. This likely reflects decreased flow due to the distal occlusion, although an underlying chronic dissection is not excluded. Left carotid system: Patent without evidence of stenosis or dissection. Mild soft plaque in the bulb. Vertebral arteries: The left vertebral artery is patent without evidence of dissection or significant stenosis. The right vertebral artery is occluded at its origin. There is distal reconstitution of diminutive distal V2 and V3 segments at the C2-3 level. Skeleton: Mild cervical spondylosis. Other neck: Poor dentition with multiple caries and periapical lucencies. No neck mass. Upper chest: Clear lung apices. Review of the  MIP images confirms the above findings CTA HEAD FINDINGS Anterior circulation: The intracranial right ICA is patent in the petrous and cavernous segments but occludes at the ophthalmic level. There is distal reconstitution via the right posterior communicating artery. The intracranial left ICA is patent with mild atherosclerotic plaque resulting in mild proximal cavernous stenosis. ACAs are patent with a severe origin stenosis noted on the right. MCAs are patent with mild-to-moderate branch vessel irregularity but no evidence of proximal branch occlusion or significant proximal stenosis. Moderate ACA branch vessel irregularity is also noted. No aneurysm. Posterior circulation: The intracranial left vertebral artery is patent with mild atherosclerotic  irregularity but no significant stenosis. The intracranial right vertebral artery is grossly patent but extremely small in caliber. PICAs and SCAs are grossly patent proximally. The basilar artery is patent and mildly irregular without significant stenosis. There are right larger than left posterior communicating artery is with both P1 segments appearing mildly hypoplastic. There is a moderate to severe distal right P1 stenosis. Left PCA is patent without significant proximal stenosis. No aneurysm. Venous sinuses: Patent. Anatomic variants: None. Delayed phase: No abnormal enhancement. Review of the MIP images confirms the above findings IMPRESSION: 1. 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 Distal reconstitution via the posterior communicating artery. 2. Patent right MCA without significant proximal stenosis. 3. Severe right ACA origin stenosis. 4. Severe distal right P1 PCA stenosis. 5. 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 Electronically Signed   By: Logan Bores M.D.   On: 07/18/2016 12:25        Scheduled Meds: . ARIPiprazole  10 mg Oral Daily  . aspirin EC  81 mg Oral Daily  .  atorvastatin  80 mg Oral q1800  . calcium-vitamin D  1 tablet Oral Q breakfast  . cloNIDine  0.1 mg Oral TID  . [START ON 07/19/2016] enoxaparin (LOVENOX) injection  60 mg Subcutaneous Q24H  . famotidine  20 mg Oral Daily  . finasteride  5 mg Oral Daily  . fluticasone  2 spray Each Nare Daily  . levofloxacin  500 mg Oral Daily  . lisinopril  30 mg Oral Daily  . metoprolol  50 mg Oral BID   Continuous Infusions: . sodium chloride 75 mL/hr at 07/17/16 0526     LOS: 3 days       Mauricio Gerome Apley, MD [facility] Pager [phone]  If 7PM-7AM, please contact night-coverage [email or web address] Password [facility] 07/18/2016, 12:41 PM

## 2016-07-18 NOTE — Evaluation (Signed)
Occupational Therapy Evaluation Patient Details Name: Ronald [name] MRN: [id number] DOB: 01/04/1946 Today's Date: 07/18/2016    History of Present Illness 71 yo male with onset of CAP, fall after L side became weak, R ICA stenosis noted, gliosis of R cerebellar white matter, L knee OA and L LL PNA.   Clinical Impression   Pt reports he was independent with ADL PTA. Currently pt overall min assist for basic transfers and max assist for LB ADL. Pt presenting with LE weakness, impaired balance, pain, and hx of falls impacting his independence and safety with ADL and functional mobility. Recommending SNF for follow up to maximize independence and safety with ADL and functional mobility prior to return home. Pt would benefit from continued skilled OT to address established goals.    Follow Up Recommendations  SNF;Supervision/Assistance - 24 hour    Equipment Recommendations  Other (comment) (TBD at next venue)    Recommendations for Other Services       Precautions / Restrictions Precautions Precautions: Fall Restrictions Weight Bearing Restrictions: No      Mobility Bed Mobility Overal bed mobility: Needs Assistance Bed Mobility: Supine to Sit     Supine to sit: Mod assist     General bed mobility comments: Assist for LLE to EOB and trunk elevation to sitting.   Transfers Overall transfer level: Needs assistance Equipment used: Rolling walker (2 wheeled) Transfers: Sit to/from [facility] Sit to Stand: Min assist Stand pivot transfers: Min assist       General transfer comment: Cues for hand placement. Min assist to boost up from EOB and for steadying balance with pivot to chair.    Balance Overall balance assessment: Needs assistance;History of Falls Sitting-balance support: Feet supported;Bilateral upper extremity supported Sitting balance-Leahy Scale: Fair     Standing balance support: Bilateral upper extremity supported Standing balance-Leahy  Scale: Poor                             ADL either performed or assessed with clinical judgement   ADL Overall ADL's : Needs assistance/impaired Eating/Feeding: Set up;Sitting   Grooming: Min guard;Sitting;Cueing for safety   Upper Body Bathing: Min guard;Sitting   Lower Body Bathing: Maximal assistance;Sit to/from stand   Upper Body Dressing : Min guard;Sitting   Lower Body Dressing: Maximal assistance;Sit to/from stand   Toilet Transfer: Minimal assistance;Stand-pivot;RW Armed forces technical officer Details (indicate cue type and reason): Simulated by stand pivot from bed > chair         Functional mobility during ADLs: Minimal assistance;Rolling walker (stand pivot)       Vision   Additional Comments: Appears WFL. Needs further assessment.     Perception     Praxis      Pertinent Vitals/Pain Pain Assessment: Faces Faces Pain Scale: Hurts little more Pain Location: L thigh Pain Descriptors / Indicators: Sore Pain Intervention(s): Monitored during session;Repositioned     Hand Dominance     Extremity/Trunk Assessment Upper Extremity Assessment Upper Extremity Assessment: Overall WFL for tasks assessed   Lower Extremity Assessment Lower Extremity Assessment: Defer to PT evaluation   Cervical / Trunk Assessment Cervical / Trunk Assessment: Normal   Communication Communication Communication: No difficulties   Cognition Arousal/Alertness: Awake/alert Behavior During Therapy: WFL for tasks assessed/performed Overall Cognitive Status: Within Functional Limits for tasks assessed  General Comments       Exercises     Shoulder Instructions      Home Living Family/patient expects to be discharged to:: Private residence Living Arrangements: Other relatives Available Help at Discharge: Family;Available 24 hours/day Type of Home: House             Bathroom Shower/Tub: Engineer, site: Standard     Home Equipment: None          Prior Functioning/Environment Level of Independence: Independent with assistive device(s)        Comments: used rollator, RW at home        OT Problem List: Decreased strength;Impaired balance (sitting and/or standing);Decreased safety awareness;Decreased knowledge of use of DME or AE;Obesity;Pain      OT Treatment/Interventions: Self-care/ADL training;Energy conservation;DME and/or AE instruction;Therapeutic activities;Patient/family education;Balance training    OT Goals(Current goals can be found in the care plan section) Acute Rehab OT Goals Patient Stated Goal: get L leg working again OT Goal Formulation: With patient Time For Goal Achievement: 08/01/16 Potential to Achieve Goals: Good ADL Goals Pt Will Perform Grooming: with min guard assist;standing (x3 tasks) Pt Will Perform Lower Body Bathing: with min guard assist;sit to/from stand Pt Will Perform Lower Body Dressing: with min guard assist;sit to/from stand Pt Will Transfer to Toilet: with min guard assist;ambulating;bedside commode (over toilet)  OT Frequency: Min 2X/week   Barriers to D/C:            Co-evaluation              End of Session Equipment Utilized During Treatment: Gait belt;Rolling walker Nurse Communication: Mobility status;Other (comment) (IV beeping; RN tech)  Activity Tolerance: Patient tolerated treatment well Patient left: in chair;with call bell/phone within reach;with chair alarm set  OT Visit Diagnosis: Unsteadiness on feet (R26.81);Other abnormalities of gait and mobility (R26.89);History of falling (Z91.81)                Time: 0301-3143 OT Time Calculation (min): 15 min Charges:  OT General Charges $OT Visit: 1 Procedure OT Evaluation $OT Eval Moderate Complexity: 1 Procedure G-Codes:     Jordain Radin A. Ulice Brilliant, M.S., OTR/L Pager: [place] 07/18/2016, 3:17 PM

## 2016-07-18 NOTE — Progress Notes (Signed)
Neurology progress note  Carotid Dopplers were completed yesterday evening. Preliminary report indicates evidence of distal occlusion of the right internal carotid artery with 40-59 percent stenosis of the proximal left internal carotid artery.  CT angiogram of the head and neck was obtained to further evaluate Doppler findings. This was completed today. This showed occlusion of the intracranial right internal carotid artery at the level of the ophthalmic artery. There is distal reconstitution of the vessel, likely from the posterior communicating artery. Right middle cerebral artery appears to be patent with good flow, no significant stenosis. Severe stenosis is noted the portion of the right anterior cerebral artery. Additional severe stenosis is seen in the P1 segment of the right posterior cerebral artery. The right vertebral artery appears to be occluded at its origin and is diminutive along its course. No significant stenosis is seen in the left carotid. Scattered mild atherosclerotic changes are noted through multiple vessels.  Impression: 1. Intracranial right internal carotid artery occlusion: No intervention available for this occluded vessel, which appears to be chronic in nature. This would certainly help to explain the asymmetric watershed distribution abnormalities seen in the white matter of the right cerebral hemisphere. Management is aggressive risk factor modification with tight control of blood pressure, glucose, and lipids. Continue antiplatelet therapy. Given additional areas of high-grade stenosis in the right ACA and right PCA, consideration should be given to dual antiplatelet therapy with combination Plavix and aspirin for 90 days followed by aspirin monotherapy. Continue high intensity statin with atorvastatin 80 mg daily.  2. Intracranial arterial stenosis: As noted above, he has severe stenosis of the right ACA and PCA. Risk factor modification, antiplatelet therapy, statin as  above. While aggressive management of hypertension is necessary, this will also need to be balanced against possible hypotension as reduced cerebral perfusion could be problematic with stenotic vessels, and could result in infarct.  3. Left hemiparesis: I suspect this may be more chronic than initially reported. MRI scan does not support an acute stroke. His symptoms could be explained by the chronic changes noted on imaging. Avoid hypotension and ensure adequate volume status to optimize cerebral perfusion. PT/OT/rehabilitation as needed.  At this time, I have no additional recommendations and will sign off. Please call if any further questions should arise.

## 2016-07-19 DIAGNOSIS — I63231 Cerebral infarction due to unspecified occlusion or stenosis of right carotid arteries: Secondary | ICD-10-CM

## 2016-07-19 LAB — BASIC METABOLIC PANEL
ANION GAP: 9 (ref 5–15)
BUN: 10 mg/dL (ref 6–20)
CALCIUM: 9.2 mg/dL (ref 8.9–10.3)
CHLORIDE: 101 mmol/L (ref 101–111)
CO2: 28 mmol/L (ref 22–32)
Creatinine, Ser: 0.89 mg/dL (ref 0.61–1.24)
GFR calc non Af Amer: >60 mL/min (ref >60)
Glucose, Bld: 121 mg/dL — ABNORMAL HIGH (ref 65–99)
Potassium: 4.1 mmol/L (ref 3.5–5.1)
Sodium: 138 mmol/L (ref 135–145)

## 2016-07-19 MED ORDER — CLOPIDOGREL BISULFATE 75 MG PO TABS
75.0000 mg | ORAL_TABLET | Freq: Every day | ORAL | Status: DC
  Administered 2016-07-19 – 2016-07-21 (×3): 75 mg via ORAL
  Filled 2016-07-19 (×3): qty 1

## 2016-07-19 MED ORDER — ENOXAPARIN SODIUM 60 MG/0.6ML ~~LOC~~ SOLN
60.0000 mg | SUBCUTANEOUS | Status: DC
  Administered 2016-07-21: 60 mg via SUBCUTANEOUS
  Filled 2016-07-19: qty 0.6

## 2016-07-19 MED ORDER — ASPIRIN EC 325 MG PO TBEC
325.0000 mg | DELAYED_RELEASE_TABLET | Freq: Every day | ORAL | Status: DC
  Administered 2016-07-20 – 2016-07-21 (×2): 325 mg via ORAL
  Filled 2016-07-19 (×2): qty 1

## 2016-07-19 NOTE — Progress Notes (Signed)
PROGRESS NOTE    Ronald [name]  WLN:[id number] DOB: 01/06/1946 DOA: 07/14/2016 PCP: Leeanne Rio, PA-C    Brief Narrative:  71 yo male presented to the hospital with the chief complain of ambulatory dysfunction, fever, congestion and left knee pain. Know to have dementia and bipolar. Multiple falls at home with right sided weakness. On the initial physical examination, hemodynamically stable with evident left sided weakness. Chest film positive for infiltrate. Admitted with community acquired pneumonia complicated with possible CVA. MRI with ischemic changes.    Assessment & Plan:   Principal Problem:   CAP (community acquired pneumonia) Active Problems:   Hyperlipidemia   GERD (gastroesophageal reflux disease)   Bipolar 1 disorder (HCC)   Essential hypertension   Prostate cancer ([place])   Dementia   Acute left-sided weakness   Abnormal urinalysis   Effusion of left knee   Elevated AST (SGOT)   Elevated troponin   Lobar pneumonia (HCC)   Non-traumatic rhabdomyolysis   1. Community acquired pneumonia. Follow chest film with improvement on infiltrate, patient with no dyspnea or chest pain, wbc at 6,1, cultures no growth. Will plan to complete levofloxacin on 04/23, to complete 5 days.   2. 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 With left sided weakness which is improving per patient Severe intracranial stenosis, neurology reconsulted  Who recommended repeat mir brain and cervicocerebral angiogram by interventional radiology.  clopidogrel/asa/ statin.    3. HTN. Will continue clonidine, lisinopril and metoprolol for blood pressure control.  Blood pressure 130 to 160.   4. Rhabdomyolysis. CPK trending down, renal function preserved with cr at 0.81, patient tolerating po well, will hold on IV fluids and follow on cpk in am.   5. Left knee osteoarthritis. Continue pain control and physical therapy evaluation.   6. Dementia/ bipolar. No  agitation or confusion, continue neuro checks per unit protocol. Continue abilify.   DVT prophylaxis: enoxaparin  Code Status: full  Family Communication: No family at the bedside  Disposition Plan: SNF   Consultants:   Neurology   Interventional radiology  Procedures:    Antimicrobials:  levoquin   Subjective: c/o dry cough, report left sided weakness is improving, still has some left sided leg pain, some times wake him up from sleep He is on room air, sitting up in chair.   Objective: Vitals:   07/18/16 2128 07/19/16 0532 07/19/16 0533 07/19/16 0945  BP: (!) 150/77 (!) 117/47  (!) 157/66  Pulse: 69 (!) 55    Resp: 17 16    Temp: 98.1 F (36.7 C) 97.6 F (36.4 C)    TempSrc:      SpO2: 98% 98%    Weight:   115 kg (253 lb 8.5 oz)   Height:        Intake/Output Summary (Last 24 hours) at 07/19/16 1018 Last data filed at 07/19/16 0920  Gross per 24 hour  Intake          1026.25 ml  Output             1875 ml  Net          -848.75 ml   Filed Weights   07/17/16 0300 07/18/16 0637 07/19/16 0533  Weight: 113.6 kg (250 lb 7.1 oz) 114 kg (251 lb 5.2 oz) 115 kg (253 lb 8.5 oz)    Examination:  General exam: not in pain or dyspnea E ENT: mild pallor, oral mucosa moist Respiratory system: Clear to auscultation. Respiratory effort normal. No  wheezing, rales or rhonchi.  Cardiovascular system: S1 & S2 heard, RRR. No JVD, murmurs, rubs, gallops or clicks. No pedal edema. Gastrointestinal system: Abdomen is nondistended, soft and nontender. No organomegaly or masses felt. Normal bowel sounds heard. Central nervous system: Alert and oriented. No focal neurological deficits.  Extremities: Left hemiparesis, decreased hand grip on the left, unable to move left leg against gravity.  Skin: No rashes, lesions or ulcers   Data Reviewed: I have personally reviewed following labs and imaging studies  CBC:  Recent Labs Lab 07/14/16 1954 07/16/16 0506  WBC 9.4 6.1    NEUTROABS 7.5  --   HGB 11.9* 10.1*  HCT 37.0* 33.1*  MCV 90.9 91.7  PLT 276 323   Basic Metabolic Panel:  Recent Labs Lab 07/14/16 1943 07/15/16 0827 07/16/16 0506 07/17/16 0743  NA 134*  --  135 136  K 4.2  --  4.1 4.1  CL 97*  --  101 105  CO2 24  --  26 25  GLUCOSE 108*  --  105* 104*  BUN 21*  --  17 12  CREATININE 1.11  --  0.96 0.81  CALCIUM 9.7  --  8.7* 8.7*  MG  --  2.1  --   --   PHOS  --  3.4  --   --    GFR: Estimated Creatinine Clearance: 106.1 mL/min (by C-G formula based on SCr of 0.81 mg/dL). Liver Function Tests:  Recent Labs Lab 07/14/16 1943 07/16/16 0506  AST 106* 54*  ALT 38 30  ALKPHOS 84 67  BILITOT 0.7 0.7  PROT 7.5 5.9*  ALBUMIN 3.7 2.7*   No results for input(s): LIPASE, AMYLASE in the last 168 hours. No results for input(s): AMMONIA in the last 168 hours. Coagulation Profile: No results for input(s): INR, PROTIME in the last 168 hours. Cardiac Enzymes:  Recent Labs Lab 07/14/16 1943 07/15/16 1241 07/15/16 1612 07/15/16 2200 07/16/16 0506 07/17/16 0743 07/18/16 0710 07/18/16 1414  CKTOTAL  --  3,148*  --   --  1,897* 1,240* 819* 788*  TROPONINI 0.03* <0.03 <0.03 <0.03  --   --   --   --    BNP (last 3 results) No results for input(s): PROBNP in the last 8760 hours. HbA1C: No results for input(s): HGBA1C in the last 72 hours. CBG:  Recent Labs Lab 07/14/16 1945  GLUCAP 105*   Lipid Profile:  Recent Labs  07/17/16 0743  CHOL 146  HDL 40*  LDLCALC 86  TRIG 100  CHOLHDL 3.7   Thyroid Function Tests: No results for input(s): TSH, T4TOTAL, FREET4, T3FREE, THYROIDAB in the last 72 hours. Anemia Panel: No results for input(s): VITAMINB12, FOLATE, FERRITIN, TIBC, IRON, RETICCTPCT in the last 72 hours. Sepsis Labs: No results for input(s): PROCALCITON, LATICACIDVEN in the last 168 hours.  Recent Results (from the past 240 hour(s))  Urine culture     Status: None   Collection Time: 07/15/16  8:14 AM  Result  Value Ref Range Status   Specimen Description URINE, CLEAN CATCH  Final   Special Requests NONE  Final   Culture NO GROWTH  Final   Report Status 07/16/2016 FINAL  Final  Culture, blood (routine x 2) Call MD if unable to obtain prior to antibiotics being given     Status: None (Preliminary result)   Collection Time: 07/15/16  8:27 AM  Result Value Ref Range Status   Specimen Description BLOOD RIGHT ARM  Final   Special Requests IN  PEDIATRIC BOTTLE Blood Culture adequate volume  Final   Culture NO GROWTH 3 DAYS  Final   Report Status PENDING  Incomplete  Culture, blood (routine x 2) Call MD if unable to obtain prior to antibiotics being given     Status: None (Preliminary result)   Collection Time: 07/15/16  8:27 AM  Result Value Ref Range Status   Specimen Description BLOOD RIGHT HAND  Final   Special Requests IN PEDIATRIC BOTTLE Blood Culture adequate volume  Final   Culture NO GROWTH 3 DAYS  Final   Report Status PENDING  Incomplete         Radiology Studies: Ct Angio Head W Or Wo Contrast  Result Date: 07/18/2016 CLINICAL DATA:  Acute left hemiparesis. EXAM: CT ANGIOGRAPHY HEAD AND NECK TECHNIQUE: Multidetector CT imaging of the head and neck was performed using the standard protocol during bolus administration of intravenous contrast. Multiplanar CT image reconstructions and MIPs were obtained to evaluate the vascular anatomy. Carotid stenosis measurements (when applicable) are obtained utilizing NASCET criteria, using the distal internal carotid diameter as the denominator. CONTRAST:  50 mL Isovue 370 COMPARISON:  Brain MRI 07/15/2016 and CT 07/14/2016. No prior angiographic imaging. FINDINGS: CT HEAD FINDINGS Brain: There is no evidence of acute cortical infarct, intracranial hemorrhage, mass, midline shift, or extra-axial fluid collection. Mild right cerebral white matter hypoattenuation is again noted predominantly at the level of the centrum semiovale corresponding to the  watershed pattern gliosis described on MRI. There is slight enlargement of the right lateral ventricle. Vascular: Calcified atherosclerosis at the skullbase. Skull: No fracture or focal osseous lesion. Sinuses: Minimal right maxillary sinus mucosal thickening. Clear mastoid air cells. Orbits: Unremarkable orbits. Review of the MIP images confirms the above findings CTA NECK FINDINGS Aortic arch: Standard 3 vessel aortic arch with mild-to-moderate calcified and soft plaque. Brachiocephalic and subclavian arteries are widely patent. Right carotid system: Widely patent common carotid artery with mild atherosclerotic luminal irregularity. Widely patent ICA origin with mild calcified plaque. The ICA abruptly tapers in caliber beyond the bulb and is patent but diffusely small throughout the remainder of the neck without focal stenosis or dissection flap identified. This likely reflects decreased flow due to the distal occlusion, although an underlying chronic dissection is not excluded. Left carotid system: Patent without evidence of stenosis or dissection. Mild soft plaque in the bulb. Vertebral arteries: The left vertebral artery is patent without evidence of dissection or significant stenosis. The right vertebral artery is occluded at its origin. There is distal reconstitution of diminutive distal V2 and V3 segments at the C2-3 level. Skeleton: Mild cervical spondylosis. Other neck: Poor dentition with multiple caries and periapical lucencies. No neck mass. Upper chest: Clear lung apices. Review of the MIP images confirms the above findings CTA HEAD FINDINGS Anterior circulation: The intracranial right ICA is patent in the petrous and cavernous segments but occludes at the ophthalmic level. There is distal reconstitution via the right posterior communicating artery. The intracranial left ICA is patent with mild atherosclerotic plaque resulting in mild proximal cavernous stenosis. ACAs are patent with a severe origin  stenosis noted on the right. MCAs are patent with mild-to-moderate branch vessel irregularity but no evidence of proximal branch occlusion or significant proximal stenosis. Moderate ACA branch vessel irregularity is also noted. No aneurysm. Posterior circulation: The intracranial left vertebral artery is patent with mild atherosclerotic irregularity but no significant stenosis. The intracranial right vertebral artery is grossly patent but extremely small in caliber. PICAs and SCAs are  grossly patent proximally. The basilar artery is patent and mildly irregular without significant stenosis. There are right larger than left posterior communicating artery is with both P1 segments appearing mildly hypoplastic. There is a moderate to severe distal right P1 stenosis. Left PCA is patent without significant proximal stenosis. No aneurysm. Venous sinuses: Patent. Anatomic variants: None. Delayed phase: No abnormal enhancement. Review of the MIP images confirms the above findings IMPRESSION: 1. 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 Distal reconstitution via the posterior communicating artery. 2. Patent right MCA without significant proximal stenosis. 3. Severe right ACA origin stenosis. 4. Severe distal right P1 PCA stenosis. 5. 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 Electronically Signed   By: Logan Bores M.D.   On: 07/18/2016 12:25   Ct Angio Neck W Or Wo Contrast  Result Date: 07/18/2016 CLINICAL DATA:  Acute left hemiparesis. EXAM: CT ANGIOGRAPHY HEAD AND NECK TECHNIQUE: Multidetector CT imaging of the head and neck was performed using the standard protocol during bolus administration of intravenous contrast. Multiplanar CT image reconstructions and MIPs were obtained to evaluate the vascular anatomy. Carotid stenosis measurements (when applicable) are obtained utilizing NASCET criteria, using the distal internal carotid diameter as the  denominator. CONTRAST:  50 mL Isovue 370 COMPARISON:  Brain MRI 07/15/2016 and CT 07/14/2016. No prior angiographic imaging. FINDINGS: CT HEAD FINDINGS Brain: There is no evidence of acute cortical infarct, intracranial hemorrhage, mass, midline shift, or extra-axial fluid collection. Mild right cerebral white matter hypoattenuation is again noted predominantly at the level of the centrum semiovale corresponding to the watershed pattern gliosis described on MRI. There is slight enlargement of the right lateral ventricle. Vascular: Calcified atherosclerosis at the skullbase. Skull: No fracture or focal osseous lesion. Sinuses: Minimal right maxillary sinus mucosal thickening. Clear mastoid air cells. Orbits: Unremarkable orbits. Review of the MIP images confirms the above findings CTA NECK FINDINGS Aortic arch: Standard 3 vessel aortic arch with mild-to-moderate calcified and soft plaque. Brachiocephalic and subclavian arteries are widely patent. Right carotid system: Widely patent common carotid artery with mild atherosclerotic luminal irregularity. Widely patent ICA origin with mild calcified plaque. The ICA abruptly tapers in caliber beyond the bulb and is patent but diffusely small throughout the remainder of the neck without focal stenosis or dissection flap identified. This likely reflects decreased flow due to the distal occlusion, although an underlying chronic dissection is not excluded. Left carotid system: Patent without evidence of stenosis or dissection. Mild soft plaque in the bulb. Vertebral arteries: The left vertebral artery is patent without evidence of dissection or significant stenosis. The right vertebral artery is occluded at its origin. There is distal reconstitution of diminutive distal V2 and V3 segments at the C2-3 level. Skeleton: Mild cervical spondylosis. Other neck: Poor dentition with multiple caries and periapical lucencies. No neck mass. Upper chest: Clear lung apices. Review of the  MIP images confirms the above findings CTA HEAD FINDINGS Anterior circulation: The intracranial right ICA is patent in the petrous and cavernous segments but occludes at the ophthalmic level. There is distal reconstitution via the right posterior communicating artery. The intracranial left ICA is patent with mild atherosclerotic plaque resulting in mild proximal cavernous stenosis. ACAs are patent with a severe origin stenosis noted on the right. MCAs are patent with mild-to-moderate branch vessel irregularity but no evidence of proximal branch occlusion or significant proximal stenosis. Moderate ACA branch vessel irregularity is also noted. No aneurysm. Posterior circulation: The intracranial left  vertebral artery is patent with mild atherosclerotic irregularity but no significant stenosis. The intracranial right vertebral artery is grossly patent but extremely small in caliber. PICAs and SCAs are grossly patent proximally. The basilar artery is patent and mildly irregular without significant stenosis. There are right larger than left posterior communicating artery is with both P1 segments appearing mildly hypoplastic. There is a moderate to severe distal right P1 stenosis. Left PCA is patent without significant proximal stenosis. No aneurysm. Venous sinuses: Patent. Anatomic variants: None. Delayed phase: No abnormal enhancement. Review of the MIP images confirms the above findings IMPRESSION: 1. 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 Distal reconstitution via the posterior communicating artery. 2. Patent right MCA without significant proximal stenosis. 3. Severe right ACA origin stenosis. 4. Severe distal right P1 PCA stenosis. 5. 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 Electronically Signed   By: Logan Bores M.D.   On: 07/18/2016 12:25        Scheduled Meds: . ARIPiprazole  10 mg Oral Daily  . aspirin EC  81 mg Oral Daily  .  atorvastatin  80 mg Oral q1800  . calcium-vitamin D  1 tablet Oral Q breakfast  . cloNIDine  0.1 mg Oral TID  . enoxaparin (LOVENOX) injection  60 mg Subcutaneous Q24H  . famotidine  20 mg Oral Daily  . finasteride  5 mg Oral Daily  . fluticasone  2 spray Each Nare Daily  . levofloxacin  500 mg Oral Daily  . lisinopril  30 mg Oral Daily  . metoprolol  50 mg Oral BID   Continuous Infusions:    LOS: 4 days    Time spent: >67mins   Gerson Fauth, MD PhD [facility] Pager [phone]  If 7PM-7AM, please contact night-coverage [email or web address] Password [facility] 07/19/2016, 10:18 AM

## 2016-07-19 NOTE — Progress Notes (Signed)
Pharmacy Antibiotic Note  Ronald [name] is a 71 y.o. male admitted on 4/19 on day #5 antibiotics for CAP. Continues on PO Levaquin.  Renal fxn stable.  Plan:  Levaquin 500 mg PO daily.  Rx will sign off.  Height: 5\' 9"  (175.3 cm) Weight: 253 lb 8.5 oz (115 kg) IBW/kg (Calculated) : 70.7  Temp (24hrs), Avg:97.9 F (36.6 C), Min:97.6 F (36.4 C), Max:98.1 F (36.7 C)   Recent Labs Lab 07/14/16 1943 07/14/16 1954 07/16/16 0506 07/17/16 0743 07/19/16 0934  WBC  --  9.4 6.1  --   --   CREATININE 1.11  --  0.96 0.81 0.89    Estimated Creatinine Clearance: 96.6 mL/min (by C-G formula based on SCr of 0.89 mg/dL).    Allergies  Allergen Reactions  . Dust Mite Extract Other (See Comments)    Allergy symptoms   . Pollen Extract Other (See Comments)    Allergy symptoms   . Rye Grass Flower Pollen Extract [Gramineae Pollens] Other (See Comments)    Allergy Symptoms     Antimicrobials this admission: Azithromycin 4/18>>4/20 CTX 4/18>>4/19 (dc'd 4/20 but last dose 4/19 pm) Levaquin 4/21>>  Dose adjustments this admission:  n/a  Microbiology results:  4/19 blood x 2 - ngtd  4/19 urine - negative  4/19 Influenza PCR - negative  4/19 Strep pneumo Ag - negative  Thank you for allowing pharmacy to be a part of this patient's care.  [employer], Pharm.D., BCPS Clinical Pharmacist Pager: [phone] Clinical phone for 07/19/2016 from 8:30-4:00 is [phone]. After 4pm, please call Main Rx [phone]) for assistance. 07/19/2016 1:16 PM

## 2016-07-19 NOTE — Care Management Important Message (Signed)
Important Message  Patient Details  Name: Ronald [name] MRN: [id number] Date of Birth: [birth year]   Medicare Important Message Given:  Yes    Makenize Messman [address] 07/19/2016, 3:13 PM

## 2016-07-19 NOTE — Progress Notes (Signed)
STROKE TEAM PROGRESS NOTE   HISTORY OF PRESENT ILLNESS (per record) This is a 71 year old right-handed man who presented to the emergency department with cough and fever on 07/14/16. He also complained of left-sided weakness and fall. History is obtained directly from the patient who is a fair historian. Dr. Shon Hale also reviewed available medical records for additional information as needed. No family is present at the bedside at the time of his visit.  The patient reports that he has had weakness in his left arm for about 3 weeks. He says that this has improved slightly since its onset but he still has difficulty using his left arm normally. He states that he initially did not have any problems with his left leg. However, he says that on the day of admission, he was eating breakfast when he felt dizzy and thinks he fell asleep sitting in his chair. He says that he then fell out of his chair to the ground, landing on his left side. Ever since this fall, he has had pain in his left leg located between the knee and the hip. He says the pain increases with movement of the leg. He feels like his difficulty moving his leg is related to pain rather than actual weakness.  According to the chart, family reported to ED staff that the patient complained of left arm pain after falling out of his chair. They also noted that he was unable to stand up. They indicated a prior fall on March 9, after which he was seen by his primary physician. After the initial fall he did been complaining of left knee pain and had been unable to walk.  In the emergency department, chest x-ray revealed left lower lobe pneumonia. He was admitted and placed on antibiotics and fluids. MRI scan of the brain was obtained due to concern for possible stroke. This showed no evidence of acute ischemia but did reveal some asymmetric chronic ischemic change in the right cerebral hemisphere with abnormal flow void in the right internal carotid artery.  Neurology consultation is now requested for further recommendations.   SUBJECTIVE (INTERVAL HISTORY) No family is at bedside. Dr. Florencia Reasons was at bedside discussing with pt for his care and placement. Pt still has LUE pronator drift but LLE only 2/5. Will need MRI repeat to confirm the stroke diagnosis and cerebral angio for further evaluation of right ICA occlusion.     OBJECTIVE Temp:  [97.6 F (36.4 C)-98.2 F (36.8 C)] 97.6 F (36.4 C) (04/23 0532) Pulse Rate:  [55-69] 55 (04/23 0532) Cardiac Rhythm: Sinus bradycardia (04/23 0700) Resp:  [16-18] 16 (04/23 0532) BP: (117-157)/(47-77) 157/66 (04/23 0945) SpO2:  [98 %] 98 % (04/23 0532) Weight:  [115 kg (253 lb 8.5 oz)] 115 kg (253 lb 8.5 oz) (04/23 0533)  CBC:  Recent Labs Lab 07/14/16 1954 07/16/16 0506  WBC 9.4 6.1  NEUTROABS 7.5  --   HGB 11.9* 10.1*  HCT 37.0* 33.1*  MCV 90.9 91.7  PLT 276 277    Basic Metabolic Panel:  Recent Labs Lab 07/15/16 0827  07/17/16 0743 07/19/16 0934  NA  --   < > 136 138  K  --   < > 4.1 4.1  CL  --   < > 105 101  CO2  --   < > 25 28  GLUCOSE  --   < > 104* 121*  BUN  --   < > 12 10  CREATININE  --   < > 0.81  0.89  CALCIUM  --   < > 8.7* 9.2  MG 2.1  --   --   --   PHOS 3.4  --   --   --   < > = values in this interval not displayed.  Lipid Panel:    Component Value Date/Time   CHOL 146 07/17/2016 0743   TRIG 100 07/17/2016 0743   HDL 40 (L) 07/17/2016 0743   CHOLHDL 3.7 07/17/2016 0743   VLDL 20 07/17/2016 0743   LDLCALC 86 07/17/2016 0743   HgbA1c:  Lab Results  Component Value Date   HGBA1C 5.4 07/16/2016   Urine Drug Screen:    Component Value Date/Time   LABOPIA NONE DETECTED 07/14/2016 2135   COCAINSCRNUR NONE DETECTED 07/14/2016 2135   LABBENZ NONE DETECTED 07/14/2016 2135   AMPHETMU NONE DETECTED 07/14/2016 2135   THCU NONE DETECTED 07/14/2016 2135   LABBARB NONE DETECTED 07/14/2016 2135    Alcohol Level     Component Value Date/Time   ETH <5  07/14/2016 1952    IMAGING I have personally reviewed the radiological images below and agree with the radiology interpretations.  Ct Head Wo Contrast 07/14/2016 1. Mild age advanced atrophy and white matter disease likely reflects the sequela of chronic microvascular ischemia. 2. No acute intracranial abnormality.   Mr Brain Wo Contrast 07/15/2016 1. No acute finding. 2. Asymmetric gliosis in the right cerebral white matter, deep watershed pattern. Asymmetric abnormal appearance of the right ICA, suspect chronic flow limiting stenosis.   Ct Angio Head W Or Wo Contrast Ct Angio Neck W Or Wo Contrast 07/18/2016 1. 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 Distal reconstitution via the posterior communicating artery. 2. Patent right MCA without significant proximal stenosis. 3. Severe right ACA origin stenosis. 4. Severe distal right P1 PCA stenosis. 5. 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   Dg Hip Unilat W Or Wo Pelvis 2-3 Views Left 07/15/2016 Negative.    Dg Knee 1-2 Views Left 07/15/2016 There are mild osteoarthritic changes of all 3 joint compartments. No acute fracture or dislocation. No significant joint space loss.   MRI limited - pending  Cerebral angio - pending   PHYSICAL EXAM  Temp:  [97.6 F (36.4 C)-98.2 F (36.8 C)] 97.6 F (36.4 C) (04/23 0532) Pulse Rate:  [55-69] 55 (04/23 0532) Resp:  [16-18] 16 (04/23 0532) BP: (117-157)/(47-77) 157/66 (04/23 0945) SpO2:  [98 %] 98 % (04/23 0532) Weight:  [253 lb 8.5 oz (115 kg)] 253 lb 8.5 oz (115 kg) (04/23 0533)  General - Well nourished, well developed, in no apparent distress.  Ophthalmologic - Fundi not visualized due to eye movement.  Cardiovascular - Regular rate and rhythm.  Mental Status -  Level of arousal and orientation to time, place, and person were intact. Language including expression, naming, repetition, comprehension was  assessed and found intact. Fund of Knowledge was assessed and was intact.  Cranial Nerves II - XII - II - Visual field intact OU. III, IV, VI - Extraocular movements intact. V - Facial sensation intact bilaterally. VII - Facial movement intact bilaterally. VIII - Hearing & vestibular intact bilaterally. X - Palate elevates symmetrically. XI - Chin turning & shoulder shrug intact bilaterally. XII - Tongue protrusion intact.  Motor Strength - The patient's strength was 4+/5 LUE with decreased dexterity and 2/5 LLE, 5/5 RUE and RLE, and pronator drift was present on the left.  Bulk was normal and  fasciculations were absent.   Motor Tone - Muscle tone was assessed at the neck and appendages and was increased on the LLE.  Reflexes - The patient's reflexes were 1+ in all extremities and he had no pathological reflexes.  Sensory - Light touch, temperature/pinprick and were decreased on the left UE and LE, about 80% of the right.    Coordination - The patient had left FTN mild dysmetria.  Tremor was absent.  Gait and Station - not able to test   ASSESSMENT/PLAN Ronald [name] is a 71 y.o. male with history of ementia/severe bipolar disorder, prostate cancer on medical therapy, hypertension, dyslipidemia, and GERD presenting with recent fall, L knee pain, inability to walk; cough, fever, congestion. Found to have LLL PNA.MRI negative for acute stroke.   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 High grade stenosis  CT head no acute abnormality. small vessel disease  MRI 07/15/16 No acute stroke. R cerebral WM gliosis. Abnormal appearance R ICA  CTA Head/neck read as R ICA occlusion at opthalmic level with distal reconstitution. Severe R ACA origin and distal R P1 stenosis. Occluded R VA or origin.  Cerebral angio pending  Repeat limited MRI pending  LDL 86  HgbA1c 5.4  Lovenox 40 mg sq daily for VTE prophylaxis  Diet Heart Room service appropriate? Yes;  Fluid consistency: Thin  No antithrombotic prior to admission, now on DAPT due to intracranial stenosis. Continue DAPT for 3 months and then plavix alone.   Patient counseled to be compliant with his antithrombotic medications  Ongoing aggressive stroke risk factor management  Therapy recommendations:  SNF  Disposition:  pending   Right ICA occlusion vs. High grade stenosis  Right ICA decreased caliber at origin  Occlusion vs. High grade stenosis near ophthalmic artery segment  Cerebral angio pending  Hypertension  Stable at goal  On clonidine, metoprolol and lisinopril  BP goal 130-150 due to right ICA occlusion vs. High grade stenosis  Hyperlipidemia  Home meds:  zocor 40  LDL 86, goal < 70  Now on lipitor 80  Continue statin at discharge  Other Stroke Risk Factors  Advanced age  Obesity, Body mass index is 37.44 kg/m., recommend weight loss, diet and exercise as appropriate   Other Active Problems  CAP on abx x 5 days  rhabdomylosis - improving  L knee osteoarthritis  [facility] day # 4  [name], MD PhD Stroke Neurology 07/19/2016 11:42 AM   To contact Stroke Continuity provider, please refer to [email or web address] After hours, contact General Neurology

## 2016-07-19 NOTE — Consult Note (Signed)
Chief Complaint: Patient was seen in consultation today for cerebral arteriogram Chief Complaint  Patient presents with  . Dizziness   at the request of Dr Lavera Guise  Referring Physician(s): Dr Lavera Guise  Supervising Physician: Corrie Mckusick  Patient Status: [facility] - In-pt  History of Present Illness: Ronald [name] is a 71 y.o. male   [address] at home with weakness left low leg Admission 4/18 Hx: Bipolar; prostate Ca; HTN; HLD Work up in ED revealed PNA and CVA  Doppler 4/22: Preliminary report indicates evidence of distal occlusion of the right internal carotid artery with 40-59 percent stenosis of the proximal left internal carotid artery.  CTA 4/22 IMPRESSION: 1. 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 Distal reconstitution via the posterior communicating artery. 2. Patent right MCA without significant proximal stenosis. 3. Severe right ACA origin stenosis. 4. Severe distal right P1 PCA stenosis. 5. 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  Request for cerebral arteriogram in IR Pt has eaten breakfast and has received Lovenox Inj this am Plan for arteriogram 4/24  Past Medical History:  Diagnosis Date  . Cancer ([place])   . Chicken pox   . Dementia   . Depressed   . Hyperlipemia   . Hypertension   . Measles   . Mumps   . 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 Unremarkable      Allergies: Dust mite extract; Pollen extract; and Rye grass flower pollen extract [gramineae pollens]  Medications: Prior to Admission medications   Medication Sig Start Date End Date Taking? Authorizing Provider  fluticasone (FLONASE) 50 MCG/ACT nasal spray Place 2 sprays into both nostrils daily. 10/21/15  Yes Brunetta Jeans, PA-C  hydrochlorothiazide (HYDRODIURIL) 25 MG tablet TAKE 1 TABLET (25 MG TOTAL) BY MOUTH 2 (TWO) TIMES DAILY. 01/16/16   Yes Brunetta Jeans, PA-C  lisinopril (PRINIVIL,ZESTRIL) 30 MG tablet TAKE 1 TABLET (30 MG TOTAL) BY MOUTH DAILY. 05/28/16  Yes Brunetta Jeans, PA-C  metoprolol (LOPRESSOR) 50 MG tablet TAKE 1 TABLET BY MOUTH TWICE A DAY 05/28/16  Yes Brunetta Jeans, PA-C  ARIPiprazole (ABILIFY) 10 MG tablet TAKE 1 TABLET BY MOUTH EVERY DAY Patient not taking: Reported on 07/15/2016 01/16/16   Brunetta Jeans, PA-C  Calcium-Vitamin D 600-200 MG-UNIT per tablet Take 1 tablet by mouth daily.    Historical Provider, MD  cloNIDine (CATAPRES) 0.1 MG tablet Take 1 tablet (0.1 mg total) by mouth 3 (three) times daily. Patient not taking: Reported on 07/15/2016 10/21/15   Brunetta Jeans, PA-C  potassium chloride SA (KLOR-CON M20) 20 MEQ tablet TAKE 1 TABLET (20 MEQ TOTAL) BY MOUTH DAILY. Patient not taking: Reported on 07/15/2016 10/31/15   Brunetta Jeans, PA-C  ranitidine (ZANTAC) 150 MG capsule TAKE ONE CAPSULE BY MOUTH TWICE A DAY Patient not taking: Reported on 07/15/2016 04/18/15   Brunetta Jeans, PA-C  simvastatin (ZOCOR) 40 MG tablet TAKE 1 TABLET (40 MG TOTAL) BY MOUTH EVERY EVENING. Patient not taking: Reported on 07/15/2016 04/16/16   Brunetta Jeans, PA-C     Family History  Problem Relation Age of Onset  . Hypertension Father   . Hyperlipidemia Father   . Congestive Heart Failure Father 27    Deceased-1995  . Diabetes Mother   . Kidney failure Mother 56    Deceased-2005  . Hypertension Mother   . Congestive  Heart Failure Maternal Grandmother   . Heart failure Paternal Grandmother   . Heart attack Paternal Grandmother     Social History   Social History  . Marital status: Single    Spouse name: N/A  . Number of children: N/A  . Years of education: N/A   Social History Main Topics  . Smoking status: Never Smoker  . Smokeless tobacco: Never Used  . Alcohol use No  . Drug use: No  . Sexual activity: No   Other Topics Concern  . None   Social History Narrative  . None    Review of  Systems: A 12 point ROS discussed and pertinent positives are indicated in the HPI above.  All other systems are negative.  Review of Systems  Constitutional: Positive for activity change and fatigue. Negative for appetite change and fever.  HENT: Negative for tinnitus and trouble swallowing.   Eyes: Negative for visual disturbance.  Respiratory: Negative for cough and shortness of breath.   Cardiovascular: Negative for chest pain.  Gastrointestinal: Negative for abdominal pain.  Musculoskeletal: Positive for gait problem. Negative for back pain.  Neurological: Positive for weakness. Negative for dizziness, tremors, seizures, syncope, facial asymmetry, speech difficulty, light-headedness, numbness and headaches.  Psychiatric/Behavioral: Negative for behavioral problems and confusion.    Vital Signs: BP (!) 157/66   Pulse (!) 55   Temp 97.6 F (36.4 C)   Resp 16   Ht 5\' 9"  (1.753 m)   Wt 253 lb 8.5 oz (115 kg)   SpO2 98%   BMI 37.44 kg/m   Physical Exam  Constitutional: He is oriented to person, place, and time. He appears well-nourished.  HENT:  Face symmetrical Tongue midline  Eyes: EOM are normal.  Neck: Neck supple.  Cardiovascular: Normal rate, regular rhythm and normal heart sounds.   Pulmonary/Chest: Effort normal and breath sounds normal. He has no wheezes.  Abdominal: Soft. Bowel sounds are normal. There is no tenderness.  Musculoskeletal: Normal range of motion.  Moves all 4s Left low extr is slow; weaker Sensation intact all 4s   Neurological: He is alert and oriented to person, place, and time.  Skin: Skin is warm and dry.  Psychiatric: He has a normal mood and affect. His behavior is normal. Judgment and thought content normal.  Nursing note and vitals reviewed.   Mallampati Score:  MD Evaluation Airway: WNL Heart: WNL Abdomen: WNL Chest/ Lungs: WNL ASA  Classification: 3 Mallampati/Airway Score: One  Imaging: Ct Angio Head W Or Wo  Contrast  Result Date: 07/18/2016 CLINICAL DATA:  Acute left hemiparesis. EXAM: CT ANGIOGRAPHY HEAD AND NECK TECHNIQUE: Multidetector CT imaging of the head and neck was performed using the standard protocol during bolus administration of intravenous contrast. Multiplanar CT image reconstructions and MIPs were obtained to evaluate the vascular anatomy. Carotid stenosis measurements (when applicable) are obtained utilizing NASCET criteria, using the distal internal carotid diameter as the denominator. CONTRAST:  50 mL Isovue 370 COMPARISON:  Brain MRI 07/15/2016 and CT 07/14/2016. No prior angiographic imaging. FINDINGS: CT HEAD FINDINGS Brain: There is no evidence of acute cortical infarct, intracranial hemorrhage, mass, midline shift, or extra-axial fluid collection. Mild right cerebral white matter hypoattenuation is again noted predominantly at the level of the centrum semiovale corresponding to the watershed pattern gliosis described on MRI. There is slight enlargement of the right lateral ventricle. Vascular: Calcified atherosclerosis at the skullbase. Skull: No fracture or focal osseous lesion. Sinuses: Minimal right maxillary sinus mucosal thickening. Clear mastoid air cells.  Orbits: Unremarkable orbits. Review of the MIP images confirms the above findings CTA NECK FINDINGS Aortic arch: Standard 3 vessel aortic arch with mild-to-moderate calcified and soft plaque. Brachiocephalic and subclavian arteries are widely patent. Right carotid system: Widely patent common carotid artery with mild atherosclerotic luminal irregularity. Widely patent ICA origin with mild calcified plaque. The ICA abruptly tapers in caliber beyond the bulb and is patent but diffusely small throughout the remainder of the neck without focal stenosis or dissection flap identified. This likely reflects decreased flow due to the distal occlusion, although an underlying chronic dissection is not excluded. Left carotid system: Patent without  evidence of stenosis or dissection. Mild soft plaque in the bulb. Vertebral arteries: The left vertebral artery is patent without evidence of dissection or significant stenosis. The right vertebral artery is occluded at its origin. There is distal reconstitution of diminutive distal V2 and V3 segments at the C2-3 level. Skeleton: Mild cervical spondylosis. Other neck: Poor dentition with multiple caries and periapical lucencies. No neck mass. Upper chest: Clear lung apices. Review of the MIP images confirms the above findings CTA HEAD FINDINGS Anterior circulation: The intracranial right ICA is patent in the petrous and cavernous segments but occludes at the ophthalmic level. There is distal reconstitution via the right posterior communicating artery. The intracranial left ICA is patent with mild atherosclerotic plaque resulting in mild proximal cavernous stenosis. ACAs are patent with a severe origin stenosis noted on the right. MCAs are patent with mild-to-moderate branch vessel irregularity but no evidence of proximal branch occlusion or significant proximal stenosis. Moderate ACA branch vessel irregularity is also noted. No aneurysm. Posterior circulation: The intracranial left vertebral artery is patent with mild atherosclerotic irregularity but no significant stenosis. The intracranial right vertebral artery is grossly patent but extremely small in caliber. PICAs and SCAs are grossly patent proximally. The basilar artery is patent and mildly irregular without significant stenosis. There are right larger than left posterior communicating artery is with both P1 segments appearing mildly hypoplastic. There is a moderate to severe distal right P1 stenosis. Left PCA is patent without significant proximal stenosis. No aneurysm. Venous sinuses: Patent. Anatomic variants: None. Delayed phase: No abnormal enhancement. Review of the MIP images confirms the above findings IMPRESSION: 1. Intracranial right ICA occlusion  at the ophthalmic level. Distal reconstitution via the posterior communicating artery. 2. Patent right MCA without significant proximal stenosis. 3. Severe right ACA origin stenosis. 4. Severe distal right P1 PCA stenosis. 5. 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 Electronically Signed   By: Logan Bores M.D.   On: 07/18/2016 12:25   Dg Chest 2 View  Result Date: 07/16/2016 CLINICAL DATA:  Followup pneumonia.  Asymptomatic, but has dementia EXAM: CHEST  2 VIEW COMPARISON:  07/14/2016 FINDINGS: Mild cardiomegaly. Mediastinal contours are distorted by rightward rotation. No acute infiltrate or edema. No effusion or pneumothorax. No acute osseous findings. IMPRESSION: Lungs appear clear today.  No evidence of pneumonia. Electronically Signed   By: Monte Fantasia M.D.   On: 07/16/2016 08:38   Dg Chest 2 View  Result Date: 07/14/2016 CLINICAL DATA:  Persistent cough. The cough is intermittently productive. EXAM: CHEST  2 VIEW COMPARISON:  Chest and rib radiographs 07/16/2015 FINDINGS: The heart size is exaggerated by low lung volumes. Left lower lobe pneumonia is present. The right lung is clear. A sclerotic changes are noted at the aortic arch. There is no significant effusion or edema. The visualized soft tissues  and bony thorax are unremarkable. IMPRESSION: 1. Left lower lobe pneumonia. 2. Atherosclerosis of the aorta. Electronically Signed   By: San Morelle M.D.   On: 07/14/2016 21:53   Dg Knee 1-2 Views Left  Result Date: 07/15/2016 CLINICAL DATA:  Onset of left knee pain yesterday with stiffness. Improved range of motion today. No known injury. EXAM: LEFT KNEE - 1-2 VIEW COMPARISON:  None in PACs FINDINGS: The bones are subjectively adequately mineralized. There is beaking of the tibial spines. Small spurs arise from the articular margins of the femoral condyles and tibial plateaus. There are spurs associated with the superior and  inferior articular margins of the patella. There is no acute fracture or dislocation. There is no joint effusion. IMPRESSION: There are mild osteoarthritic changes of all 3 joint compartments. No acute fracture or dislocation. No significant joint space loss. Electronically Signed   By: David  Martinique M.D.   On: 07/15/2016 10:49   Ct Head Wo Contrast  Result Date: 07/14/2016 CLINICAL DATA:  Increased dizziness and slurred speech today. [place] out of chair twice. Initial encounter. EXAM: CT HEAD WITHOUT CONTRAST TECHNIQUE: Contiguous axial images were obtained from the base of the skull through the vertex without intravenous contrast. COMPARISON:  None. FINDINGS: Brain: Mild atrophy and white matter changes are present bilaterally. No acute infarct, hemorrhage, or mass lesion is present. The basal ganglia are intact. Insular ribbon is normal. No focal cortical defect is evident. The ventricles are proportionate to the degree of atrophy. No significant extra-axial fluid collection is present. Incidental calcifications are noted at the ideal gland. Vascular: Negative Skull: Calvarium is intact. No focal lytic or blastic lesions are present. Sinuses/Orbits: The paranasal sinuses an mastoid air cells are clear. The globes and orbits are unremarkable. IMPRESSION: 1. Mild age advanced atrophy and white matter disease likely reflects the sequela of chronic microvascular ischemia. 2. No acute intracranial abnormality. Electronically Signed   By: San Morelle M.D.   On: 07/14/2016 21:57   Ct Angio Neck W Or Wo Contrast  Result Date: 07/18/2016 CLINICAL DATA:  Acute left hemiparesis. EXAM: CT ANGIOGRAPHY HEAD AND NECK TECHNIQUE: Multidetector CT imaging of the head and neck was performed using the standard protocol during bolus administration of intravenous contrast. Multiplanar CT image reconstructions and MIPs were obtained to evaluate the vascular anatomy. Carotid stenosis measurements (when applicable) are  obtained utilizing NASCET criteria, using the distal internal carotid diameter as the denominator. CONTRAST:  50 mL Isovue 370 COMPARISON:  Brain MRI 07/15/2016 and CT 07/14/2016. No prior angiographic imaging. FINDINGS: CT HEAD FINDINGS Brain: There is no evidence of acute cortical infarct, intracranial hemorrhage, mass, midline shift, or extra-axial fluid collection. Mild right cerebral white matter hypoattenuation is again noted predominantly at the level of the centrum semiovale corresponding to the watershed pattern gliosis described on MRI. There is slight enlargement of the right lateral ventricle. Vascular: Calcified atherosclerosis at the skullbase. Skull: No fracture or focal osseous lesion. Sinuses: Minimal right maxillary sinus mucosal thickening. Clear mastoid air cells. Orbits: Unremarkable orbits. Review of the MIP images confirms the above findings CTA NECK FINDINGS Aortic arch: Standard 3 vessel aortic arch with mild-to-moderate calcified and soft plaque. Brachiocephalic and subclavian arteries are widely patent. Right carotid system: Widely patent common carotid artery with mild atherosclerotic luminal irregularity. Widely patent ICA origin with mild calcified plaque. The ICA abruptly tapers in caliber beyond the bulb and is patent but diffusely small throughout the remainder of the neck without focal stenosis or dissection flap  identified. This likely reflects decreased flow due to the distal occlusion, although an underlying chronic dissection is not excluded. Left carotid system: Patent without evidence of stenosis or dissection. Mild soft plaque in the bulb. Vertebral arteries: The left vertebral artery is patent without evidence of dissection or significant stenosis. The right vertebral artery is occluded at its origin. There is distal reconstitution of diminutive distal V2 and V3 segments at the C2-3 level. Skeleton: Mild cervical spondylosis. Other neck: Poor dentition with multiple caries  and periapical lucencies. No neck mass. Upper chest: Clear lung apices. Review of the MIP images confirms the above findings CTA HEAD FINDINGS Anterior circulation: The intracranial right ICA is patent in the petrous and cavernous segments but occludes at the ophthalmic level. There is distal reconstitution via the right posterior communicating artery. The intracranial left ICA is patent with mild atherosclerotic plaque resulting in mild proximal cavernous stenosis. ACAs are patent with a severe origin stenosis noted on the right. MCAs are patent with mild-to-moderate branch vessel irregularity but no evidence of proximal branch occlusion or significant proximal stenosis. Moderate ACA branch vessel irregularity is also noted. No aneurysm. Posterior circulation: The intracranial left vertebral artery is patent with mild atherosclerotic irregularity but no significant stenosis. The intracranial right vertebral artery is grossly patent but extremely small in caliber. PICAs and SCAs are grossly patent proximally. The basilar artery is patent and mildly irregular without significant stenosis. There are right larger than left posterior communicating artery is with both P1 segments appearing mildly hypoplastic. There is a moderate to severe distal right P1 stenosis. Left PCA is patent without significant proximal stenosis. No aneurysm. Venous sinuses: Patent. Anatomic variants: None. Delayed phase: No abnormal enhancement. Review of the MIP images confirms the above findings IMPRESSION: 1. 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 Distal reconstitution via the posterior communicating artery. 2. Patent right MCA without significant proximal stenosis. 3. Severe right ACA origin stenosis. 4. Severe distal right P1 PCA stenosis. 5. 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 Electronically Signed   By: Logan Bores M.D.   On: 07/18/2016 12:25   Mr Brain Wo  Contrast  Result Date: 07/15/2016 CLINICAL DATA:  Left-sided weakness.  History of prostate cancer. EXAM: MRI HEAD WITHOUT CONTRAST TECHNIQUE: Multiplanar, multiecho pulse sequences of the brain and surrounding structures were obtained without intravenous contrast. COMPARISON:  Head CT from yesterday FINDINGS: Brain: No acute infarction, hemorrhage, hydrocephalus, extra-axial collection or mass lesion. Asymmetric white matter gliosis roughly aligned along the right lateral ventricle. The right lateral ventricle is slightly larger than the left, see coronal T2 acquisition. Few small remote infarcts in the peripheral right cerebellum. Vascular: Asymmetric abnormal signal in the right ICA, suspect stenosis and slow flow. Skull and upper cervical spine: Negative Sinuses/Orbits: Negative Other: Motion degraded study which could obscure pathology. IMPRESSION: 1. No acute finding. 2. Asymmetric gliosis in the right cerebral white matter, deep watershed pattern. Asymmetric abnormal appearance of the right ICA, suspect chronic flow limiting stenosis. Electronically Signed   By: Monte Fantasia M.D.   On: 07/15/2016 13:32   Dg Hip Unilat W Or Wo Pelvis 2-3 Views Left  Result Date: 07/15/2016 CLINICAL DATA:  Left hip pain after a fall last night. EXAM: DG HIP (WITH OR WITHOUT PELVIS) 2-3V LEFT COMPARISON:  None. FINDINGS: There is no evidence of hip fracture or dislocation. There is no evidence of arthropathy or other focal bone abnormality. IMPRESSION: Negative. Electronically Signed  By: Lucienne Capers M.D.   On: 07/15/2016 02:48    Labs:  CBC:  Recent Labs  07/14/16 1954 07/16/16 0506  WBC 9.4 6.1  HGB 11.9* 10.1*  HCT 37.0* 33.1*  PLT 276 224    COAGS: No results for input(s): INR, APTT in the last 8760 hours.  BMP:  Recent Labs  07/14/16 1943 07/16/16 0506 07/17/16 0743 07/19/16 0934  NA 134* 135 136 138  K 4.2 4.1 4.1 4.1  CL 97* 101 105 101  CO2 24 26 25 28   GLUCOSE 108* 105*  104* 121*  BUN 21* 17 12 10   CALCIUM 9.7 8.7* 8.7* 9.2  CREATININE 1.11 0.96 0.81 0.89  GFRNONAA >60 >60 >60 >60  GFRAA >60 >60 >60 >60    LIVER FUNCTION TESTS:  Recent Labs  10/21/15 1456 07/14/16 1943 07/16/16 0506  BILITOT 0.3 0.7 0.7  AST 17 106* 54*  ALT 24 38 30  ALKPHOS 82 84 67  PROT 7.9 7.5 5.9*  ALBUMIN 4.0 3.7 2.7*    TUMOR MARKERS: No results for input(s): AFPTM, CEA, CA199, CHROMGRNA in the last 8760 hours.  Assessment and Plan:  1. Intracranial right internal carotid artery occlusion 2. Intracranial arterial stenosis 3. Left hemiparesis Scheduled for cerebral arteriogram - planned for 4/24 Risks and Benefits discussed with the patient including, but not limited to bleeding, infection, vascular injury, contrast induced renal failure, stroke or even death. All of the patient's questions were answered, patient is agreeable to proceed. Consent signed and in chart.  Thank you for this interesting consult.  I greatly enjoyed meeting Ronald [name] and look forward to participating in their care.  A copy of this report was sent to the requesting provider on this date.  Electronically Signed: Monia Sabal A 07/19/2016, 11:11 AM   I spent a total of 40 Minutes    in face to face in clinical consultation, greater than 50% of which was counseling/coordinating care for cerebral arteriogram

## 2016-07-20 ENCOUNTER — Encounter: Payer: Self-pay | Admitting: *Deleted

## 2016-07-20 ENCOUNTER — Inpatient Hospital Stay (HOSPITAL_COMMUNITY): Payer: Medicare Other

## 2016-07-20 ENCOUNTER — Encounter (HOSPITAL_COMMUNITY): Payer: Self-pay | Admitting: Interventional Radiology

## 2016-07-20 DIAGNOSIS — I6330 Cerebral infarction due to thrombosis of unspecified cerebral artery: Secondary | ICD-10-CM | POA: Insufficient documentation

## 2016-07-20 DIAGNOSIS — Z006 Encounter for examination for normal comparison and control in clinical research program: Secondary | ICD-10-CM

## 2016-07-20 HISTORY — PX: IR ANGIO EXTERNAL CAROTID SEL EXT CAROTID UNI R MOD SED: IMG5371

## 2016-07-20 HISTORY — PX: IR ANGIO INTRA EXTRACRAN SEL INTERNAL CAROTID BILAT MOD SED: IMG5363

## 2016-07-20 HISTORY — PX: IR ANGIO VERTEBRAL SEL SUBCLAVIAN INNOMINATE UNI L MOD SED: IMG5364

## 2016-07-20 HISTORY — PX: IR ANGIOGRAM EXTREMITY RIGHT: IMG652

## 2016-07-20 LAB — PROTIME-INR
INR: 1.1
PROTHROMBIN TIME: 14.2 s (ref 11.4–15.2)

## 2016-07-20 LAB — CULTURE, BLOOD (ROUTINE X 2)
CULTURE: NO GROWTH
Culture: NO GROWTH
SPECIAL REQUESTS: ADEQUATE
Special Requests: ADEQUATE

## 2016-07-20 MED ORDER — IOPAMIDOL (ISOVUE-300) INJECTION 61%
INTRAVENOUS | Status: AC
  Administered 2016-07-20: 100 mL
  Filled 2016-07-20: qty 150

## 2016-07-20 MED ORDER — HEPARIN SODIUM (PORCINE) 1000 UNIT/ML IJ SOLN
INTRAMUSCULAR | Status: AC
  Filled 2016-07-20: qty 2

## 2016-07-20 MED ORDER — FENTANYL CITRATE (PF) 100 MCG/2ML IJ SOLN
INTRAMUSCULAR | Status: AC
  Filled 2016-07-20: qty 2

## 2016-07-20 MED ORDER — FENTANYL CITRATE (PF) 100 MCG/2ML IJ SOLN
INTRAMUSCULAR | Status: AC | PRN
  Administered 2016-07-20: 25 ug via INTRAVENOUS

## 2016-07-20 MED ORDER — MIDAZOLAM HCL 2 MG/2ML IJ SOLN
INTRAMUSCULAR | Status: AC
  Filled 2016-07-20: qty 2

## 2016-07-20 MED ORDER — LIDOCAINE HCL 1 % IJ SOLN
INTRAMUSCULAR | Status: AC
Start: 2016-07-20 — End: 2016-07-21
  Filled 2016-07-20: qty 20

## 2016-07-20 MED ORDER — MIDAZOLAM HCL 2 MG/2ML IJ SOLN
INTRAMUSCULAR | Status: AC | PRN
  Administered 2016-07-20: 1 mg via INTRAVENOUS

## 2016-07-20 MED ORDER — MIDAZOLAM HCL 2 MG/2ML IJ SOLN
INTRAMUSCULAR | Status: AC
  Filled 2016-07-20: qty 4

## 2016-07-20 MED ORDER — LIDOCAINE HCL 1 % IJ SOLN
INTRAMUSCULAR | Status: AC | PRN
  Administered 2016-07-20: 10 mL

## 2016-07-20 MED ORDER — IOPAMIDOL (ISOVUE-300) INJECTION 61%
INTRAVENOUS | Status: AC
  Administered 2016-07-20: 125 mL
  Filled 2016-07-20: qty 100

## 2016-07-20 NOTE — Sedation Documentation (Addendum)
Pt transported to Rm 5W15 on bed with RN.  Groin and pulses assessed with pt nurse, Meredeth Ide, RN,  and unchanged.

## 2016-07-20 NOTE — Progress Notes (Signed)
STROKE TEAM PROGRESS NOTE   SUBJECTIVE (INTERVAL HISTORY) No family is at bedside. Left LE weakness improved. MRI brain showed right hemisphere watershed punctate infarcts. Pending cerebral angiogram. Candidate for STROKE AF trial.      OBJECTIVE Temp:  [97.9 F (36.6 C)-98.9 F (37.2 C)] 97.9 F (36.6 C) (04/24 0520) Pulse Rate:  [54-83] 54 (04/24 1405) Cardiac Rhythm: Sinus bradycardia (04/24 1410) Resp:  [13-21] 21 (04/24 1405) BP: (117-152)/(52-92) 145/68 (04/24 1405) SpO2:  [97 %-100 %] 100 % (04/24 1405) Weight:  [246 lb 4.1 oz (111.7 kg)] 246 lb 4.1 oz (111.7 kg) (04/24 0423)  CBC:   Recent Labs Lab 07/14/16 1954 07/16/16 0506  WBC 9.4 6.1  NEUTROABS 7.5  --   HGB 11.9* 10.1*  HCT 37.0* 33.1*  MCV 90.9 91.7  PLT 276 627    Basic Metabolic Panel:  Recent Labs Lab 07/15/16 0827  07/17/16 0743 07/19/16 0934  NA  --   < > 136 138  K  --   < > 4.1 4.1  CL  --   < > 105 101  CO2  --   < > 25 28  GLUCOSE  --   < > 104* 121*  BUN  --   < > 12 10  CREATININE  --   < > 0.81 0.89  CALCIUM  --   < > 8.7* 9.2  MG 2.1  --   --   --   PHOS 3.4  --   --   --   < > = values in this interval not displayed.  Lipid Panel:     Component Value Date/Time   CHOL 146 07/17/2016 0743   TRIG 100 07/17/2016 0743   HDL 40 (L) 07/17/2016 0743   CHOLHDL 3.7 07/17/2016 0743   VLDL 20 07/17/2016 0743   LDLCALC 86 07/17/2016 0743   HgbA1c:  Lab Results  Component Value Date   HGBA1C 5.4 07/16/2016   Urine Drug Screen:     Component Value Date/Time   LABOPIA NONE DETECTED 07/14/2016 2135   COCAINSCRNUR NONE DETECTED 07/14/2016 2135   LABBENZ NONE DETECTED 07/14/2016 2135   AMPHETMU NONE DETECTED 07/14/2016 2135   THCU NONE DETECTED 07/14/2016 2135   LABBARB NONE DETECTED 07/14/2016 2135    Alcohol Level     Component Value Date/Time   ETH <5 07/14/2016 1952    IMAGING I have personally reviewed the radiological images below and agree with the radiology  interpretations.  Ct Head Wo Contrast 07/14/2016 1. Mild age advanced atrophy and white matter disease likely reflects the sequela of chronic microvascular ischemia. 2. No acute intracranial abnormality.   Mr Brain Wo Contrast 07/15/2016 1. No acute finding. 2. Asymmetric gliosis in the right cerebral white matter, deep watershed pattern. Asymmetric abnormal appearance of the right ICA, suspect chronic flow limiting stenosis.   Ct Angio Head W Or Wo Contrast Ct Angio Neck W Or Wo Contrast 07/18/2016 1. 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 Distal reconstitution via the posterior communicating artery. 2. Patent right MCA without significant proximal stenosis. 3. Severe right ACA origin stenosis. 4. Severe distal right P1 PCA stenosis. 5. 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   Dg Hip Unilat W Or Wo Pelvis 2-3 Views Left 07/15/2016 Negative.    Dg Knee 1-2 Views Left 07/15/2016 There are mild osteoarthritic changes of all 3 joint compartments. No acute fracture or dislocation. No significant joint space loss.  Mr Brain Limited Wo Contrast 07/20/2016 IMPRESSION: In this patient with a pattern of chronic watershed ischemia on the right, there are several punctate foci of acute infarction in the right watershed distribution seen on today's study.     Cerebral angio - pending   PHYSICAL EXAM  Temp:  [97.9 F (36.6 C)-98.9 F (37.2 C)] 97.9 F (36.6 C) (04/24 0520) Pulse Rate:  [54-83] 54 (04/24 1405) Resp:  [13-21] 21 (04/24 1405) BP: (117-152)/(52-92) 145/68 (04/24 1405) SpO2:  [97 %-100 %] 100 % (04/24 1405) Weight:  [246 lb 4.1 oz (111.7 kg)] 246 lb 4.1 oz (111.7 kg) (04/24 0423)  General - Well nourished, well developed, in no apparent distress.  Ophthalmologic - Fundi not visualized due to eye movement.  Cardiovascular - Regular rate and rhythm.  Mental Status -  Level of arousal and orientation  to time, place, and person were intact. Language including expression, naming, repetition, comprehension was assessed and found intact. Fund of Knowledge was assessed and was intact.  Cranial Nerves II - XII - II - Visual field intact OU. III, IV, VI - Extraocular movements intact. V - Facial sensation intact bilaterally. VII - Facial movement intact bilaterally. VIII - Hearing & vestibular intact bilaterally. X - Palate elevates symmetrically. XI - Chin turning & shoulder shrug intact bilaterally. XII - Tongue protrusion intact.  Motor Strength - The patient's strength was 4+/5 LUE with decreased dexterity and 2/5 LLE proximal but 3/5 LLE knee extension, 5/5 RUE and RLE, and pronator drift was present on the left.  Bulk was normal and fasciculations were absent.   Motor Tone - Muscle tone was assessed at the neck and appendages and was increased on the LLE.  Reflexes - The patient's reflexes were 1+ in all extremities and he had no pathological reflexes.  Sensory - Light touch, temperature/pinprick and were decreased on the left UE and LE, about 80% of the right.    Coordination - The patient had left FTN mild dysmetria.  Tremor was absent.  Gait and Station - not able to test   ASSESSMENT/PLAN Mr. Laban Orourke is a 71 y.o. male with history of ementia/severe bipolar disorder, prostate cancer on medical therapy, hypertension, dyslipidemia, and GERD presenting with recent fall, L knee pain, inability to walk; cough, fever, congestion. Found to have LLL PNA.MRI negative for acute stroke.   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 High grade stenosis  CT head no acute abnormality. small vessel disease  MRI 07/15/16 No acute stroke. R cerebral WM gliosis. Abnormal appearance R ICA  CTA Head/neck read as R ICA occlusion at opthalmic level with distal reconstitution. Severe R ACA origin and distal R P1 stenosis. Occluded R VA or origin.  Repeat MRI right  MCA/ACA, MCA/PCA watershed punctate infarcts.  Cerebral angio pending  LDL 86  HgbA1c 5.4  Lovenox 40 mg sq daily for VTE prophylaxis Diet NPO time specified  No antithrombotic prior to admission, now on DAPT due to intracranial stenosis. Continue DAPT for 3 months and then plavix alone.   Patient counseled to be compliant with his antithrombotic medications  Ongoing aggressive stroke risk factor management  Therapy recommendations:  SNF  Disposition:  pending   Right ICA occlusion vs. High grade stenosis  Right ICA decreased caliber at origin  Occlusion vs. High grade stenosis near ophthalmic artery segment  Cerebral angio pending  Hypertension  Stable at goal  On clonidine, metoprolol and lisinopril  BP goal 130-150 due to right  ICA occlusion vs. High grade stenosis  Hyperlipidemia  Home meds:  zocor 40  LDL 86, goal < 70  Now on lipitor 80  Continue statin at discharge  Other Stroke Risk Factors  Advanced age  Obesity, Body mass index is 36.37 kg/m., recommend weight loss, diet and exercise as appropriate   Other Active Problems  CAP on abx x 5 days  rhabdomylosis - improving  L knee osteoarthritis  Dementia/bipolar   Candidate for STROKE AF trial - informed cardiac research coordinator  Hospital day # 5  [name], MD PhD Stroke Neurology 07/20/2016 2:11 PM   To contact Stroke Continuity provider, please refer to [email or web address] After hours, contact General Neurology

## 2016-07-20 NOTE — Progress Notes (Signed)
PROGRESS NOTE    Ronald [name]  VOH:[id number] DOB: Mar 02, 1946 DOA: 07/14/2016 PCP: Leeanne Rio, PA-C    Brief Narrative:  71 yo male presented to the hospital with the chief complain of ambulatory dysfunction, fever, congestion and left knee pain. Know to have dementia and bipolar. Multiple falls at home with right sided weakness. On the initial physical examination, hemodynamically stable with evident left sided weakness. Chest film positive for infiltrate. Admitted with community acquired pneumonia complicated with possible CVA. MRI with ischemic changes.    Assessment & Plan:   Principal Problem:   CAP (community acquired pneumonia) Active Problems:   Hyperlipidemia   GERD (gastroesophageal reflux disease)   Bipolar 1 disorder (HCC)   Essential hypertension   Prostate cancer ([place])   Dementia   Acute left-sided weakness   Abnormal urinalysis   Effusion of left knee   Elevated AST (SGOT)   Elevated troponin   Lobar pneumonia (HCC)   Non-traumatic rhabdomyolysis   Cerebral thrombosis with cerebral infarction   1. Community acquired pneumonia. Follow chest film with improvement on infiltrate, patient with no dyspnea or chest pain, wbc wnl, cultures no growth.  completed levofloxacin on 4/23.   2. 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 With left sided weakness which is improving per patient Severe intracranial stenosis, neurology reconsulted   Repeat mri + acute infarct, cervicocerebral angiogram by interventional radiology. Will need to follow up with IR for possible intervention  clopidogrel/asa/ statin.    3. HTN. Will continue clonidine, lisinopril and metoprolol for blood pressure control.  Blood pressure 130 to 160.   4. Rhabdomyolysis. CPK trending down, renal function preserved with cr at 0.81, patient tolerating po well, will hold on IV fluids and follow on cpk in am.   5. Left knee osteoarthritis. Continue pain control  and physical therapy evaluation.   6. Dementia/ bipolar. No agitation or confusion, continue neuro checks per unit protocol. Continue abilify.   DVT prophylaxis: enoxaparin  Code Status: full  Family Communication: No family at the bedside  Disposition Plan: he refused SNF, will arrange home health, discharge on 4/25 with outpatient neurology follow up and IR follow up   Consultants:   Neurology   Interventional radiology  Procedures:   cervicocerebral angiogram on 4/24  Antimicrobials:  levoquin   Subjective: Report cough has almost resolved, report left sided weakness is improving,  He is on room air, sitting up in chair.   Objective: Vitals:   07/20/16 1500 07/20/16 1505 07/20/16 1510 07/20/16 1722  BP: (!) 142/74 140/76 (!) 160/73 133/60  Pulse: (!) 52 (!) 50 (!) 52 62  Resp: 18 19 18 18   Temp:    98.7 F (37.1 C)  TempSrc:      SpO2: 100% 100% 100% 98%  Weight:      Height:        Intake/Output Summary (Last 24 hours) at 07/20/16 2044 Last data filed at 07/20/16 1652  Gross per 24 hour  Intake                0 ml  Output             2200 ml  Net            -2200 ml   Filed Weights   07/18/16 0637 07/19/16 0533 07/20/16 0423  Weight: 114 kg (251 lb 5.2 oz) 115 kg (253 lb 8.5 oz) 111.7 kg (246 lb 4.1 oz)    Examination:  General  exam: not in pain or dyspnea E ENT: mild pallor, oral mucosa moist Respiratory system: Clear to auscultation. Respiratory effort normal. No wheezing, rales or rhonchi.  Cardiovascular system: S1 & S2 heard, RRR. No JVD, murmurs, rubs, gallops or clicks. No pedal edema. Gastrointestinal system: Abdomen is nondistended, soft and nontender. No organomegaly or masses felt. Normal bowel sounds heard. Central nervous system: Alert and oriented. No focal neurological deficits.  Extremities: Left hemiparesis, decreased hand grip on the left, unable to move left leg against gravity.  Skin: No rashes, lesions or ulcers   Data  Reviewed: I have personally reviewed following labs and imaging studies  CBC:  Recent Labs Lab 07/14/16 1954 07/16/16 0506  WBC 9.4 6.1  NEUTROABS 7.5  --   HGB 11.9* 10.1*  HCT 37.0* 33.1*  MCV 90.9 91.7  PLT 276 333   Basic Metabolic Panel:  Recent Labs Lab 07/14/16 1943 07/15/16 0827 07/16/16 0506 07/17/16 0743 07/19/16 0934  NA 134*  --  135 136 138  K 4.2  --  4.1 4.1 4.1  CL 97*  --  101 105 101  CO2 24  --  26 25 28   GLUCOSE 108*  --  105* 104* 121*  BUN 21*  --  17 12 10   CREATININE 1.11  --  0.96 0.81 0.89  CALCIUM 9.7  --  8.7* 8.7* 9.2  MG  --  2.1  --   --   --   PHOS  --  3.4  --   --   --    GFR: Estimated Creatinine Clearance: 95.1 mL/min (by C-G formula based on SCr of 0.89 mg/dL). Liver Function Tests:  Recent Labs Lab 07/14/16 1943 07/16/16 0506  AST 106* 54*  ALT 38 30  ALKPHOS 84 67  BILITOT 0.7 0.7  PROT 7.5 5.9*  ALBUMIN 3.7 2.7*   No results for input(s): LIPASE, AMYLASE in the last 168 hours. No results for input(s): AMMONIA in the last 168 hours. Coagulation Profile:  Recent Labs Lab 07/20/16 0929  INR 1.10   Cardiac Enzymes:  Recent Labs Lab 07/14/16 1943 07/15/16 1241 07/15/16 1612 07/15/16 2200 07/16/16 0506 07/17/16 0743 07/18/16 0710 07/18/16 1414  CKTOTAL  --  3,148*  --   --  1,897* 1,240* 819* 788*  TROPONINI 0.03* <0.03 <0.03 <0.03  --   --   --   --    BNP (last 3 results) No results for input(s): PROBNP in the last 8760 hours. HbA1C: No results for input(s): HGBA1C in the last 72 hours. CBG:  Recent Labs Lab 07/14/16 1945  GLUCAP 105*   Lipid Profile: No results for input(s): CHOL, HDL, LDLCALC, TRIG, CHOLHDL, LDLDIRECT in the last 72 hours. Thyroid Function Tests: No results for input(s): TSH, T4TOTAL, FREET4, T3FREE, THYROIDAB in the last 72 hours. Anemia Panel: No results for input(s): VITAMINB12, FOLATE, FERRITIN, TIBC, IRON, RETICCTPCT in the last 72 hours. Sepsis Labs: No results for  input(s): PROCALCITON, LATICACIDVEN in the last 168 hours.  Recent Results (from the past 240 hour(s))  Urine culture     Status: None   Collection Time: 07/15/16  8:14 AM  Result Value Ref Range Status   Specimen Description URINE, CLEAN CATCH  Final   Special Requests NONE  Final   Culture NO GROWTH  Final   Report Status 07/16/2016 FINAL  Final  Culture, blood (routine x 2) Call MD if unable to obtain prior to antibiotics being given     Status: None  Collection Time: 07/15/16  8:27 AM  Result Value Ref Range Status   Specimen Description BLOOD RIGHT ARM  Final   Special Requests IN PEDIATRIC BOTTLE Blood Culture adequate volume  Final   Culture NO GROWTH 5 DAYS  Final   Report Status 07/20/2016 FINAL  Final  Culture, blood (routine x 2) Call MD if unable to obtain prior to antibiotics being given     Status: None   Collection Time: 07/15/16  8:27 AM  Result Value Ref Range Status   Specimen Description BLOOD RIGHT HAND  Final   Special Requests IN PEDIATRIC BOTTLE Blood Culture adequate volume  Final   Culture NO GROWTH 5 DAYS  Final   Report Status 07/20/2016 FINAL  Final         Radiology Studies: Ir Angiogram Extremity Right  Result Date: 07/20/2016 INDICATION: 71 year old male with a history of left-sided weakness and evidence of right-sided clinical stroke. Prior MRI demonstrates diffusion restriction in a watershed distribution of the right hemisphere. EXAM: BILATERAL COMMON CAROTID AND INNOMINATE ANGIOGRAPHY AND BILATERAL VERTEBRAL ARTERY ANGIOGRAMS MEDICATIONS: None ANESTHESIA/SEDATION: Versed 1.0 mg IV; Fentanyl 25 mcg IV Moderate Sedation Time:  55 The patient was continuously monitored during the procedure by the interventional radiology nurse under my direct supervision. FLUOROSCOPY TIME:  Fluoroscopy Time: 12 minutes 30 seconds 2950 mGy). COMPLICATIONS: None TECHNIQUE: Informed written consent was obtained from the patient after a thorough discussion of the  procedural risks, benefits and alternatives. All questions were addressed. Maximal Sterile Barrier Technique was utilized including caps, mask, sterile gowns, sterile gloves, sterile drape, hand hygiene and skin antiseptic. A timeout was performed prior to the initiation of the procedure. PROCEDURE: The right inguinal region was prepped and draped in the usual sterile fashion. 1% lidocaine was used for local anesthesia. Ultrasound survey of the right inguinal region was performed with images stored and sent to PACs. A micropuncture needle was used access the right common femoral artery under ultrasound. With excellent arterial blood flow returned, and an .018 micro wire was passed through the needle, observed enter the abdominal aorta under fluoroscopy. The needle was removed, and a micropuncture sheath was placed over the wire. The inner dilator and wire were removed, and an 035 Bentson wire was advanced under fluoroscopy into the abdominal aorta. The sheath was removed and a standard 5 [place] vascular sheath was placed. The dilator was removed and the sheath was flushed. A 5 French JB1 catheter was advanced to the aortic arch region, wire was removed, and double flush was achieved. Catheter was then positioned into the origin of the innominate artery, with roadmap angiogram performed. Catheter was advanced into the subclavian artery over 035 roadrunner wire. Wire was removed and the catheter was withdrawn to the origin of the vertebral artery. Non selective injection of the subclavian artery was performed, with images acquired at the neck and intracranial. Catheter was then withdrawn, double flush was achieved, and the catheter was advanced over the wire into the common carotid artery. Angiogram of the cervical and of the intracranial segments was performed. Independent sub selection of both the external carotid artery and internal carotid artery were performed with cervical and cranial segment angiogram  performed. Catheter was then withdrawn to the aortic arch, with double flush performed. Left common carotid artery was selected, and the catheter was advanced over the wire. Left common carotid artery angiogram was performed. Independent sub selection of the internal carotid artery was then performed, with angiogram of the cervical and cranial segments.  Catheter was withdrawn to the aortic arch with double flush. Left subclavian artery was then selected, with non selective injection of the left vertebral artery origin. Angiogram of the cervical and intracranial segments performed. Catheter was withdrawn from the sheath. Angiogram of the right common femoral artery sheath performed. Exoseal closure was performed. Patient tolerated the procedure well and remained hemodynamically stable throughout. No complications were encountered and no significant blood loss. FINDINGS: Innominate artery: Tortuous innominate artery with no significant atherosclerotic plaque. Subclavian artery remains patent. 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 There is patency of the ascending cervical artery from the thyrocervical trunk, with demonstrates subtle collateral filling of the distal cervical segment of the right vertebral artery as well as the intracranial segment to the vertebrobasilar junction. No significant contribution from the deep cervical artery at this time. Right common carotid artery:  Normal course caliber and contour. Right external carotid artery: Patent with antegrade flow. Selective injection demonstrates no significant contribution to the right hemisphere. Right internal carotid artery: Selective injection of the right internal carotid artery demonstrates uniform small caliber cervical segment from the bifurcation to the skullbase. No dissection flap or significant stenosis. No irregularity of the lumen to suggest  thrombus. The vertical and horizontal petrous segment are patent. Last all segment patent. Cavernous segment patent. The intracranial ICA is patent to the level of the clinoid and terminates at the ophthalmic artery which demonstrates antegrade flow. Beyond this, the artery is occluded with no flow. There is questionable development of Vasa those or Um beyond the occlusion, with no significant contribution to the M1 segment. No meniscus or tapered lumen. Right MCA: No filling of the MCA via the right ICA injection. Right MCA fills from a left-to-right flow across patent anterior communicating artery. There appears to be a irregular stenosis of less than 50% narrowing of the distal M1 segment, corresponding to findings on prior CT scan. Right ACA: No filling of the right ACA via the right IC injection. Right-sided ACA fills via the left or right cross flow, with slightly delayed perfusion through the late arterial and capillary phase compared to the left. Left common carotid artery:  Normal course caliber and contour. Left external carotid artery: Patent with antegrade flow. Left internal carotid artery: Normal course caliber and contour of the cervical segment of the left ICA. Caliber is uniform, larger than the right. Left intracranial ICA patent. Antegrade flow of the left ophthalmic artery. Adequate flow through to the ICA terminus with filling of the M1 and A1 segments. Left MCA: M1 segment patent. Insular and opercular segments patent. Unremarkable caliber and course of the cortical segments. Typical arterial, capillary/ parenchymal, and venous phase. Left ACA: A1 segment patent. Anterior communicating artery is patent with filling of the right MCA from cross flow. Right a 2 segment filling the right ACA territory. Perfusion slightly delayed compared to the left hemisphere. Left vertebral artery: Unremarkable course caliber and contour of the left vertebral artery from the subclavian origin to the skullbase,  with no significant stenosis, dissection flap, or regularity. Injection demonstrates reflux into the right distal vertebral artery at the vertebrobasilar junction, demonstrating small caliber distal right vertebral artery at the junction. Retrograde flow through patent right-sided posterior communicating artery contributes to perfusion of the right MCA territory. Bilateral posterior communicating arteries patent. Tortuous basilar artery with patency of the bilateral posterior cerebral arteries maintained. Right-sided posterior inferior cerebellar artery not visualized.  Unremarkable appearance of the right-sided anterior inferior cerebellar artery. Unremarkable appearance of the left posterior inferior cerebellar artery. Small caliber superior cerebellar arteries. IMPRESSION: Status post cervical and cerebral angiogram demonstrating relatively small caliber cervical segment of the right ICA, which is uniform from the bifurcation to the ophthalmic segment. 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 Perfusion of the right MCA from cross filling of the left intracranial ICA via patent anterior communicating artery, as well as retrograde flow through patent right posterior communicating artery. Questionable atherosclerotic changes in the distal right M1 segment, with less than 50% stenosis. Dominant left vertebral artery perfuses bilateral PCA. These results were called by telephone at the time of interpretation on 07/20/2016 at 4:56 pm to Dr. [name] , who verbally acknowledged these results. Signed, Dulcy Fanny. Earleen Newport, DO [facility] Radiology Electronically Signed   By: Corrie Mckusick D.O.   On: 07/20/2016 16:57   Mr Brain Limited Wo  Contrast  Result Date: 07/20/2016 CLINICAL DATA:  Intracranial right ICA occlusion. Intracranial arterial stenosis. Left hemiparesis. EXAM: MRI HEAD WITHOUT CONTRAST TECHNIQUE: Multiplanar, multiecho pulse sequences of the brain and surrounding structures were obtained without intravenous contrast. COMPARISON:  MRI 07/15/2016.  CT angiography 07/18/2016. FINDINGS: Brain: Diffusion imaging shows scattered punctate acute infarctions along the watershed distribution on the right. There are 3 small foci in the temporal occipital region an a few punctate foci in the frontal and parietal regions. Chronic gliosis in the right watershed distribution remains evident. No sign of hemorrhage. No sign of swelling. No hydrocephalus or extra-axial collection. Vascular: Limited vascular data due to reduced number of sequences performed. Skull and upper cervical spine: No additional data. 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 Electronically Signed   By: Nelson Chimes M.D.   On: 07/20/2016 08:42   Ir Angio Intra Extracran Sel Internal Carotid Bilat Mod Sed  Result Date: 07/20/2016 INDICATION: 71 year old male with a history of left-sided weakness and evidence of right-sided clinical stroke. Prior MRI demonstrates diffusion restriction in a watershed distribution of the right hemisphere. EXAM: BILATERAL COMMON CAROTID AND INNOMINATE ANGIOGRAPHY AND BILATERAL VERTEBRAL ARTERY ANGIOGRAMS MEDICATIONS: None ANESTHESIA/SEDATION: Versed 1.0 mg IV; Fentanyl 25 mcg IV Moderate Sedation Time:  55 The patient was continuously monitored during the procedure by the interventional radiology nurse under my direct supervision. FLUOROSCOPY TIME:  Fluoroscopy Time: 12 minutes 30 seconds 2950 mGy). COMPLICATIONS: None TECHNIQUE: Informed written consent  was obtained from the patient after a thorough discussion of the procedural risks, benefits and alternatives. All questions were addressed. Maximal Sterile Barrier Technique was utilized including caps, mask, sterile gowns, sterile gloves, sterile drape, hand hygiene and skin antiseptic. A timeout was performed prior to the initiation of the procedure. PROCEDURE: The right inguinal region was prepped and draped in the usual sterile fashion. 1% lidocaine was used for local anesthesia. Ultrasound survey of the right inguinal region was performed with images stored and sent to PACs. A micropuncture needle was used access the right common femoral artery under ultrasound. With excellent arterial blood flow returned, and an .018 micro wire was passed through the needle, observed enter the  abdominal aorta under fluoroscopy. The needle was removed, and a micropuncture sheath was placed over the wire. The inner dilator and wire were removed, and an 035 Bentson wire was advanced under fluoroscopy into the abdominal aorta. The sheath was removed and a standard 5 [place] vascular sheath was placed. The dilator was removed and the sheath was flushed. A 5 French JB1 catheter was advanced to the aortic arch region, wire was removed, and double flush was achieved. Catheter was then positioned into the origin of the innominate artery, with roadmap angiogram performed. Catheter was advanced into the subclavian artery over 035 roadrunner wire. Wire was removed and the catheter was withdrawn to the origin of the vertebral artery. Non selective injection of the subclavian artery was performed, with images acquired at the neck and intracranial. Catheter was then withdrawn, double flush was achieved, and the catheter was advanced over the wire into the common carotid artery. Angiogram of the cervical and of the intracranial segments was performed. Independent sub selection of both the external carotid artery and internal carotid artery  were performed with cervical and cranial segment angiogram performed. Catheter was then withdrawn to the aortic arch, with double flush performed. Left common carotid artery was selected, and the catheter was advanced over the wire. Left common carotid artery angiogram was performed. Independent sub selection of the internal carotid artery was then performed, with angiogram of the cervical and cranial segments. Catheter was withdrawn to the aortic arch with double flush. Left subclavian artery was then selected, with non selective injection of the left vertebral artery origin. Angiogram of the cervical and intracranial segments performed. Catheter was withdrawn from the sheath. Angiogram of the right common femoral artery sheath performed. Exoseal closure was performed. Patient tolerated the procedure well and remained hemodynamically stable throughout. No complications were encountered and no significant blood loss. FINDINGS: Innominate artery: Tortuous innominate artery with no significant atherosclerotic plaque. Subclavian artery remains patent. 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 There is patency of the ascending cervical artery from the thyrocervical trunk, with demonstrates subtle collateral filling of the distal cervical segment of the right vertebral artery as well as the intracranial segment to the vertebrobasilar junction. No significant contribution from the deep cervical artery at this time. Right common carotid artery:  Normal course caliber and contour. Right external carotid artery: Patent with antegrade flow. Selective injection demonstrates no significant contribution to the right hemisphere. Right internal carotid artery: Selective injection of the right internal carotid artery demonstrates uniform small caliber cervical segment from the bifurcation to the skullbase. No dissection flap or  significant stenosis. No irregularity of the lumen to suggest thrombus. The vertical and horizontal petrous segment are patent. Last all segment patent. Cavernous segment patent. The intracranial ICA is patent to the level of the clinoid and terminates at the ophthalmic artery which demonstrates antegrade flow. Beyond this, the artery is occluded with no flow. There is questionable development of Vasa those or Um beyond the occlusion, with no significant contribution to the M1 segment. No meniscus or tapered lumen. Right MCA: No filling of the MCA via the right ICA injection. Right MCA fills from a left-to-right flow across patent anterior communicating artery. There appears to be a irregular stenosis of less than 50% narrowing of the distal M1 segment, corresponding to findings on prior CT scan. Right ACA: No filling of the right ACA via the right IC injection. Right-sided ACA  fills via the left or right cross flow, with slightly delayed perfusion through the late arterial and capillary phase compared to the left. Left common carotid artery:  Normal course caliber and contour. Left external carotid artery: Patent with antegrade flow. Left internal carotid artery: Normal course caliber and contour of the cervical segment of the left ICA. Caliber is uniform, larger than the right. Left intracranial ICA patent. Antegrade flow of the left ophthalmic artery. Adequate flow through to the ICA terminus with filling of the M1 and A1 segments. Left MCA: M1 segment patent. Insular and opercular segments patent. Unremarkable caliber and course of the cortical segments. Typical arterial, capillary/ parenchymal, and venous phase. Left ACA: A1 segment patent. Anterior communicating artery is patent with filling of the right MCA from cross flow. Right a 2 segment filling the right ACA territory. Perfusion slightly delayed compared to the left hemisphere. Left vertebral artery: Unremarkable course caliber and contour of the left  vertebral artery from the subclavian origin to the skullbase, with no significant stenosis, dissection flap, or regularity. Injection demonstrates reflux into the right distal vertebral artery at the vertebrobasilar junction, demonstrating small caliber distal right vertebral artery at the junction. Retrograde flow through patent right-sided posterior communicating artery contributes to perfusion of the right MCA territory. Bilateral posterior communicating arteries patent. Tortuous basilar artery with patency of the bilateral posterior cerebral arteries maintained. Right-sided posterior inferior cerebellar artery not visualized. Unremarkable appearance of the right-sided anterior inferior cerebellar artery. Unremarkable appearance of the left posterior inferior cerebellar artery. Small caliber superior cerebellar arteries. IMPRESSION: Status post cervical and cerebral angiogram demonstrating relatively small caliber cervical segment of the right ICA, which is uniform from the bifurcation to the ophthalmic segment. 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 Perfusion of the right MCA from cross filling of the left intracranial ICA via patent anterior communicating artery, as well as retrograde flow through patent right posterior communicating artery. Questionable atherosclerotic changes in the distal right M1 segment, with less than 50% stenosis. Dominant left vertebral artery perfuses bilateral PCA. These results were called by telephone at the time of interpretation on 07/20/2016 at 4:56 pm to Dr. [name] , who verbally acknowledged these results. Signed, Dulcy Fanny. Earleen Newport, DO [facility] Radiology Electronically Signed   By: Corrie Mckusick  D.O.   On: 07/20/2016 16:57   Ir Angio Vertebral Sel Subclavian Innominate Uni L Mod Sed  Result Date: 07/20/2016 INDICATION: 71 year old male with a history of left-sided weakness and evidence of right-sided clinical stroke. Prior MRI demonstrates diffusion restriction in a watershed distribution of the right hemisphere. EXAM: BILATERAL COMMON CAROTID AND INNOMINATE ANGIOGRAPHY AND BILATERAL VERTEBRAL ARTERY ANGIOGRAMS MEDICATIONS: None ANESTHESIA/SEDATION: Versed 1.0 mg IV; Fentanyl 25 mcg IV Moderate Sedation Time:  55 The patient was continuously monitored during the procedure by the interventional radiology nurse under my direct supervision. FLUOROSCOPY TIME:  Fluoroscopy Time: 12 minutes 30 seconds 2950 mGy). COMPLICATIONS: None TECHNIQUE: Informed written consent was obtained from the patient after a thorough discussion of the procedural risks, benefits and alternatives. All questions were addressed. Maximal Sterile Barrier Technique was utilized including caps, mask, sterile gowns, sterile gloves, sterile drape, hand hygiene and skin antiseptic. A timeout was performed prior to the initiation of the procedure. PROCEDURE: The right inguinal region  was prepped and draped in the usual sterile fashion. 1% lidocaine was used for local anesthesia. Ultrasound survey of the right inguinal region was performed with images stored and sent to PACs. A micropuncture needle was used access the right common femoral artery under ultrasound. With excellent arterial blood flow returned, and an .018 micro wire was passed through the needle, observed enter the abdominal aorta under fluoroscopy. The needle was removed, and a micropuncture sheath was placed over the wire. The inner dilator and wire were removed, and an 035 Bentson wire was advanced under fluoroscopy into the abdominal aorta. The sheath was removed and a standard 5 [place] vascular sheath was placed. The dilator was removed and the sheath was flushed. A 5  French JB1 catheter was advanced to the aortic arch region, wire was removed, and double flush was achieved. Catheter was then positioned into the origin of the innominate artery, with roadmap angiogram performed. Catheter was advanced into the subclavian artery over 035 roadrunner wire. Wire was removed and the catheter was withdrawn to the origin of the vertebral artery. Non selective injection of the subclavian artery was performed, with images acquired at the neck and intracranial. Catheter was then withdrawn, double flush was achieved, and the catheter was advanced over the wire into the common carotid artery. Angiogram of the cervical and of the intracranial segments was performed. Independent sub selection of both the external carotid artery and internal carotid artery were performed with cervical and cranial segment angiogram performed. Catheter was then withdrawn to the aortic arch, with double flush performed. Left common carotid artery was selected, and the catheter was advanced over the wire. Left common carotid artery angiogram was performed. Independent sub selection of the internal carotid artery was then performed, with angiogram of the cervical and cranial segments. Catheter was withdrawn to the aortic arch with double flush. Left subclavian artery was then selected, with non selective injection of the left vertebral artery origin. Angiogram of the cervical and intracranial segments performed. Catheter was withdrawn from the sheath. Angiogram of the right common femoral artery sheath performed. Exoseal closure was performed. Patient tolerated the procedure well and remained hemodynamically stable throughout. No complications were encountered and no significant blood loss. FINDINGS: Innominate artery: Tortuous innominate artery with no significant atherosclerotic plaque. Subclavian artery remains patent. 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 There is patency of the ascending cervical artery from the thyrocervical trunk, with demonstrates subtle collateral filling of the distal cervical segment of the right vertebral artery as well as the intracranial segment to the vertebrobasilar junction. No significant contribution from the deep cervical artery at this time. Right common carotid artery:  Normal course caliber and contour. Right external carotid artery: Patent with antegrade flow. Selective injection demonstrates no significant contribution to the right hemisphere. Right internal carotid artery: Selective injection of the right internal carotid artery demonstrates uniform small caliber cervical segment from the bifurcation to the skullbase. No dissection flap or significant stenosis. No irregularity of the lumen to suggest thrombus. The vertical and horizontal petrous segment are patent. Last all segment patent. Cavernous segment patent. The intracranial ICA is patent to the level of the clinoid and terminates at the ophthalmic artery which demonstrates antegrade flow. Beyond this, the artery is occluded with no flow. There is questionable development of Vasa those or Um beyond the occlusion, with no significant contribution to the M1 segment. No meniscus or tapered  lumen. Right MCA: No filling of the MCA via the right ICA injection. Right MCA fills from a left-to-right flow across patent anterior communicating artery. There appears to be a irregular stenosis of less than 50% narrowing of the distal M1 segment, corresponding to findings on prior CT scan. Right ACA: No filling of the right ACA via the right IC injection. Right-sided ACA fills via the left or right cross flow, with slightly delayed perfusion through the late arterial and capillary phase compared to the left. Left common carotid artery:  Normal course caliber and contour. Left external carotid artery: Patent with antegrade flow. Left internal  carotid artery: Normal course caliber and contour of the cervical segment of the left ICA. Caliber is uniform, larger than the right. Left intracranial ICA patent. Antegrade flow of the left ophthalmic artery. Adequate flow through to the ICA terminus with filling of the M1 and A1 segments. Left MCA: M1 segment patent. Insular and opercular segments patent. Unremarkable caliber and course of the cortical segments. Typical arterial, capillary/ parenchymal, and venous phase. Left ACA: A1 segment patent. Anterior communicating artery is patent with filling of the right MCA from cross flow. Right a 2 segment filling the right ACA territory. Perfusion slightly delayed compared to the left hemisphere. Left vertebral artery: Unremarkable course caliber and contour of the left vertebral artery from the subclavian origin to the skullbase, with no significant stenosis, dissection flap, or regularity. Injection demonstrates reflux into the right distal vertebral artery at the vertebrobasilar junction, demonstrating small caliber distal right vertebral artery at the junction. Retrograde flow through patent right-sided posterior communicating artery contributes to perfusion of the right MCA territory. Bilateral posterior communicating arteries patent. Tortuous basilar artery with patency of the bilateral posterior cerebral arteries maintained. Right-sided posterior inferior cerebellar artery not visualized. Unremarkable appearance of the right-sided anterior inferior cerebellar artery. Unremarkable appearance of the left posterior inferior cerebellar artery. Small caliber superior cerebellar arteries. IMPRESSION: Status post cervical and cerebral angiogram demonstrating relatively small caliber cervical segment of the right ICA, which is uniform from the bifurcation to the ophthalmic segment. 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 Occlusion of the nondominant right  vertebral artery just beyond the origin, with collateral flow developing of the distal cervical segment via the ascending cervical artery. Perfusion of the right MCA from cross filling of the left intracranial ICA via patent anterior communicating artery, as well as retrograde flow through patent right posterior communicating artery. Questionable atherosclerotic changes in the distal right M1 segment, with less than 50% stenosis. Dominant left vertebral artery perfuses bilateral PCA. These results were called by telephone at the time of interpretation on 07/20/2016 at 4:56 pm to Dr. [name] , who verbally acknowledged these results. Signed, Dulcy Fanny. Earleen Newport, DO [facility] Radiology Electronically Signed   By: Corrie Mckusick D.O.   On: 07/20/2016 16:57   Ir Angio External Carotid Sel Ext Carotid Uni R Mod Sed  Result Date: 07/20/2016 INDICATION: 71 year old male with a history of left-sided weakness and evidence of right-sided clinical stroke. Prior MRI demonstrates diffusion restriction in a watershed distribution of the right hemisphere. EXAM: BILATERAL COMMON CAROTID AND INNOMINATE ANGIOGRAPHY AND BILATERAL VERTEBRAL ARTERY ANGIOGRAMS MEDICATIONS: None ANESTHESIA/SEDATION: Versed 1.0 mg IV; Fentanyl 25 mcg IV Moderate Sedation Time:  55 The patient was continuously monitored during the procedure by the interventional radiology nurse under my direct supervision. FLUOROSCOPY TIME:  Fluoroscopy Time: 12 minutes 30  seconds 2950 mGy). COMPLICATIONS: None TECHNIQUE: Informed written consent was obtained from the patient after a thorough discussion of the procedural risks, benefits and alternatives. All questions were addressed. Maximal Sterile Barrier Technique was utilized including caps, mask, sterile gowns, sterile gloves, sterile drape, hand hygiene and skin antiseptic. A timeout was performed prior to the initiation of the procedure. PROCEDURE: The right  inguinal region was prepped and draped in the usual sterile fashion. 1% lidocaine was used for local anesthesia. Ultrasound survey of the right inguinal region was performed with images stored and sent to PACs. A micropuncture needle was used access the right common femoral artery under ultrasound. With excellent arterial blood flow returned, and an .018 micro wire was passed through the needle, observed enter the abdominal aorta under fluoroscopy. The needle was removed, and a micropuncture sheath was placed over the wire. The inner dilator and wire were removed, and an 035 Bentson wire was advanced under fluoroscopy into the abdominal aorta. The sheath was removed and a standard 5 [place] vascular sheath was placed. The dilator was removed and the sheath was flushed. A 5 French JB1 catheter was advanced to the aortic arch region, wire was removed, and double flush was achieved. Catheter was then positioned into the origin of the innominate artery, with roadmap angiogram performed. Catheter was advanced into the subclavian artery over 035 roadrunner wire. Wire was removed and the catheter was withdrawn to the origin of the vertebral artery. Non selective injection of the subclavian artery was performed, with images acquired at the neck and intracranial. Catheter was then withdrawn, double flush was achieved, and the catheter was advanced over the wire into the common carotid artery. Angiogram of the cervical and of the intracranial segments was performed. Independent sub selection of both the external carotid artery and internal carotid artery were performed with cervical and cranial segment angiogram performed. Catheter was then withdrawn to the aortic arch, with double flush performed. Left common carotid artery was selected, and the catheter was advanced over the wire. Left common carotid artery angiogram was performed. Independent sub selection of the internal carotid artery was then performed, with angiogram of  the cervical and cranial segments. Catheter was withdrawn to the aortic arch with double flush. Left subclavian artery was then selected, with non selective injection of the left vertebral artery origin. Angiogram of the cervical and intracranial segments performed. Catheter was withdrawn from the sheath. Angiogram of the right common femoral artery sheath performed. Exoseal closure was performed. Patient tolerated the procedure well and remained hemodynamically stable throughout. No complications were encountered and no significant blood loss. FINDINGS: Innominate artery: Tortuous innominate artery with no significant atherosclerotic plaque. Subclavian artery remains patent. 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 There is patency of the ascending cervical artery from the thyrocervical trunk, with demonstrates subtle collateral filling of the distal cervical segment of the right vertebral artery as well as the intracranial segment to the vertebrobasilar junction. No significant contribution from the deep cervical artery at this time. Right common carotid artery:  Normal course caliber and contour. Right external carotid artery: Patent with antegrade flow. Selective injection demonstrates no significant contribution to the right hemisphere. Right internal carotid artery: Selective injection of the right internal carotid artery demonstrates uniform small caliber cervical segment from the bifurcation to the skullbase. No dissection flap or significant stenosis. No irregularity of the lumen to suggest thrombus. The vertical and horizontal  petrous segment are patent. Last all segment patent. Cavernous segment patent. The intracranial ICA is patent to the level of the clinoid and terminates at the ophthalmic artery which demonstrates antegrade flow. Beyond this, the artery is occluded with no flow. There is questionable  development of Vasa those or Um beyond the occlusion, with no significant contribution to the M1 segment. No meniscus or tapered lumen. Right MCA: No filling of the MCA via the right ICA injection. Right MCA fills from a left-to-right flow across patent anterior communicating artery. There appears to be a irregular stenosis of less than 50% narrowing of the distal M1 segment, corresponding to findings on prior CT scan. Right ACA: No filling of the right ACA via the right IC injection. Right-sided ACA fills via the left or right cross flow, with slightly delayed perfusion through the late arterial and capillary phase compared to the left. Left common carotid artery:  Normal course caliber and contour. Left external carotid artery: Patent with antegrade flow. Left internal carotid artery: Normal course caliber and contour of the cervical segment of the left ICA. Caliber is uniform, larger than the right. Left intracranial ICA patent. Antegrade flow of the left ophthalmic artery. Adequate flow through to the ICA terminus with filling of the M1 and A1 segments. Left MCA: M1 segment patent. Insular and opercular segments patent. Unremarkable caliber and course of the cortical segments. Typical arterial, capillary/ parenchymal, and venous phase. Left ACA: A1 segment patent. Anterior communicating artery is patent with filling of the right MCA from cross flow. Right a 2 segment filling the right ACA territory. Perfusion slightly delayed compared to the left hemisphere. Left vertebral artery: Unremarkable course caliber and contour of the left vertebral artery from the subclavian origin to the skullbase, with no significant stenosis, dissection flap, or regularity. Injection demonstrates reflux into the right distal vertebral artery at the vertebrobasilar junction, demonstrating small caliber distal right vertebral artery at the junction. Retrograde flow through patent right-sided posterior communicating artery contributes  to perfusion of the right MCA territory. Bilateral posterior communicating arteries patent. Tortuous basilar artery with patency of the bilateral posterior cerebral arteries maintained. Right-sided posterior inferior cerebellar artery not visualized. Unremarkable appearance of the right-sided anterior inferior cerebellar artery. Unremarkable appearance of the left posterior inferior cerebellar artery. Small caliber superior cerebellar arteries. IMPRESSION: Status post cervical and cerebral angiogram demonstrating relatively small caliber cervical segment of the right ICA, which is uniform from the bifurcation to the ophthalmic segment. 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 Perfusion of the right MCA from cross filling of the left intracranial ICA via patent anterior communicating artery, as well as retrograde flow through patent right posterior communicating artery. Questionable atherosclerotic changes in the distal right M1 segment, with less than 50% stenosis. Dominant left vertebral artery perfuses bilateral PCA. These results were called by telephone at the time of interpretation on 07/20/2016 at 4:56 pm to Dr. [name] , who verbally acknowledged these results. Signed, Dulcy Fanny. Earleen Newport, DO [facility] Radiology Electronically Signed   By: Corrie Mckusick D.O.   On: 07/20/2016 16:57        Scheduled Meds: . ARIPiprazole  10 mg Oral Daily  . aspirin EC  325 mg Oral Daily  . atorvastatin  80 mg Oral q1800  .  calcium-vitamin D  1 tablet Oral Q breakfast  . cloNIDine  0.1 mg Oral TID  . clopidogrel  75 mg Oral Daily  . [START ON 07/21/2016] enoxaparin (LOVENOX) injection  60 mg Subcutaneous Q24H  . famotidine  20 mg Oral Daily   . finasteride  5 mg Oral Daily  . fluticasone  2 spray Each Nare Daily  . lidocaine      . lisinopril  30 mg Oral Daily  . metoprolol  50 mg Oral BID   Continuous Infusions:    LOS: 5 days    Time spent: >45mins   Landis Dowdy, MD PhD [facility] Pager [phone]  If 7PM-7AM, please contact night-coverage [email or web address] Password [facility] 07/20/2016, 8:44 PM

## 2016-07-20 NOTE — Progress Notes (Signed)
CSW spoke with patient again regarding SNF placement. Patient again stated he wants people to come to his house. When talking about "rehab"patient associates it with outpatient PT and thinks we are talking about him going home. When CSW clarified, patient states he would rather go home than stay at a facility.  CSW signing off  Neysa Hotter [phone]

## 2016-07-20 NOTE — Sedation Documentation (Signed)
5 Fr sheath removed for R Femoral artery by Leafy Ro, RTR. Hemostasis achieved using Exoseal closure device. Groin level 0, 2+RDP, 1+RPT.

## 2016-07-20 NOTE — Procedures (Signed)
Interventional Radiology Procedure Note  Procedure:  Cervical-cerebral angiogram, bilateral CCA/ICA. [place] guided RCFA access.  Deployment of exoseal for closure.    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  Occluded origin of the right vertebral artery.  No filling of the distal cervical right vertebral from deep cervical or ascending cervical.   Injection of the left vertebral shows reflux into the distal right vertebral.  Patient bilateral PCA.   Left to right perfusion of the right MCA from patent acom and left ICA.    Right MCA perfusion from the right PComm.   Complications: None  Recommendations:  - Right hip straight for 4 hours - Continue current care - Do not submerge site for 7 days - Routine wound care   Signed,  Dulcy Fanny. Earleen Newport, DO

## 2016-07-20 NOTE — Sedation Documentation (Signed)
R groin level 0, drsg CDI, 3+RDP, 1+RPT.

## 2016-07-20 NOTE — Progress Notes (Signed)
STROKE-AF Research study. Reviewed chart for possible enrollment (for research study) at the request of Dr. Erlinda Hong. Patient has been having symptoms greater than 3 weeks. This is an exclusion for the study.

## 2016-07-20 NOTE — Sedation Documentation (Signed)
Gauze/tegaderm bandage applied to R femoral artery puncture site. Groin level 0, 2+RDP, 1+RPT.

## 2016-07-21 DIAGNOSIS — I63231 Cerebral infarction due to unspecified occlusion or stenosis of right carotid arteries: Secondary | ICD-10-CM

## 2016-07-21 LAB — CBC WITH DIFFERENTIAL/PLATELET
Basophils Absolute: 0 10*3/uL (ref 0.0–0.1)
Basophils Relative: 0 %
EOS ABS: 0.1 10*3/uL (ref 0.0–0.7)
EOS PCT: 1 %
HCT: 34.5 % — ABNORMAL LOW (ref 39.0–52.0)
Hemoglobin: 10.8 g/dL — ABNORMAL LOW (ref 13.0–17.0)
LYMPHS ABS: 1.2 10*3/uL (ref 0.7–4.0)
Lymphocytes Relative: 24 %
MCH: 29 pg (ref 26.0–34.0)
MCHC: 31.3 g/dL (ref 30.0–36.0)
MCV: 92.7 fL (ref 78.0–100.0)
MONO ABS: 0.3 10*3/uL (ref 0.1–1.0)
Monocytes Relative: 6 %
Neutro Abs: 3.5 10*3/uL (ref 1.7–7.7)
Neutrophils Relative %: 69 %
PLATELETS: 241 10*3/uL (ref 150–400)
RBC: 3.72 MIL/uL — AB (ref 4.22–5.81)
RDW: 14.5 % (ref 11.5–15.5)
WBC: 5.2 10*3/uL (ref 4.0–10.5)

## 2016-07-21 LAB — COMPREHENSIVE METABOLIC PANEL
ALT: 46 U/L (ref 17–63)
ANION GAP: 8 (ref 5–15)
AST: 36 U/L (ref 15–41)
Albumin: 3.1 g/dL — ABNORMAL LOW (ref 3.5–5.0)
Alkaline Phosphatase: 64 U/L (ref 38–126)
BUN: 13 mg/dL (ref 6–20)
CHLORIDE: 102 mmol/L (ref 101–111)
CO2: 27 mmol/L (ref 22–32)
CREATININE: 0.87 mg/dL (ref 0.61–1.24)
Calcium: 9.1 mg/dL (ref 8.9–10.3)
Glucose, Bld: 109 mg/dL — ABNORMAL HIGH (ref 65–99)
POTASSIUM: 4.1 mmol/L (ref 3.5–5.1)
SODIUM: 137 mmol/L (ref 135–145)
Total Bilirubin: 0.5 mg/dL (ref 0.3–1.2)
Total Protein: 6.3 g/dL — ABNORMAL LOW (ref 6.5–8.1)

## 2016-07-21 LAB — VAS US CAROTID
LEFT ECA DIAS: -5 cm/s
LEFT VERTEBRAL DIAS: -36 cm/s
LICAPDIAS: -50 cm/s
Left CCA dist dias: -11 cm/s
Left CCA dist sys: -104 cm/s
Left CCA prox dias: 33 cm/s
Left CCA prox sys: 230 cm/s
Left ICA prox sys: -140 cm/s
RIGHT ECA DIAS: -9 cm/s
RIGHT VERTEBRAL DIAS: -22 cm/s
Right CCA prox dias: 3 cm/s
Right CCA prox sys: 67 cm/s

## 2016-07-21 MED ORDER — FINASTERIDE 5 MG PO TABS: 5.0000 mg | ORAL_TABLET | Freq: Every day | ORAL | 0 refills | Status: DC

## 2016-07-21 MED ORDER — OXYCODONE HCL 5 MG PO TABS: 5.0000 mg | ORAL_TABLET | ORAL | 0 refills | Status: DC | PRN

## 2016-07-21 MED ORDER — CLONIDINE HCL 0.1 MG PO TABS: 0.1000 mg | ORAL_TABLET | Freq: Three times a day (TID) | ORAL | 11 refills | Status: DC

## 2016-07-21 MED ORDER — CLOPIDOGREL BISULFATE 75 MG PO TABS: 75.0000 mg | ORAL_TABLET | Freq: Every day | ORAL | 0 refills | Status: AC

## 2016-07-21 MED ORDER — ATORVASTATIN CALCIUM 80 MG PO TABS: 80.0000 mg | ORAL_TABLET | Freq: Every day | ORAL | 0 refills | Status: DC

## 2016-07-21 MED ORDER — ASPIRIN 325 MG PO TBEC: 325.0000 mg | DELAYED_RELEASE_TABLET | Freq: Every day | ORAL | 0 refills | Status: DC

## 2016-07-21 NOTE — Progress Notes (Signed)
Physical Therapy Treatment Patient Details Name: Ronald [name] MRN: [id number] DOB: Aug 06, 1945 Today's Date: 07/21/2016    History of Present Illness 71 yo male with onset of CAP, fall after L side became weak, R ICA stenosis noted, gliosis of R cerebellar white matter, L knee OA and L LL PNA.    PT Comments    Pt has refused SNF and plan is for him to go home with 24 hour family support. Pt reports his brother, sister-in-law, and nephew will be able to help him.  Pt verbalized understanding that he needed to have A with ambulation due to his unsteadiness.  Recommend RW, 3-1 BSC, HHPT and 24 hour S.   Follow Up Recommendations  Home health PT;Supervision/Assistance - 24 hour     Equipment Recommendations  Rolling walker with 5" wheels;3in1 (PT)    Recommendations for Other Services       Precautions / Restrictions Precautions Precautions: Fall Restrictions Weight Bearing Restrictions: No    Mobility  Bed Mobility Overal bed mobility: Needs Assistance Bed Mobility: Supine to Sit;Sit to Supine     Supine to sit: Min assist Sit to supine: Min assist   General bed mobility comments: Assist for LLE into bed. Assist for LLE OOB and HHA for trunk elevation to sitting.  Transfers Overall transfer level: Needs assistance Equipment used: Rolling walker (2 wheeled) Transfers: Sit to/from Stand Sit to Stand: Min assist         General transfer comment: Min assist for balance. Cues for hand placement and technique.  Ambulation/Gait Ambulation/Gait assistance: Min assist Ambulation Distance (Feet): 20 Feet (plus 10 and 10) Assistive device: Rolling walker (2 wheeled) Gait Pattern/deviations: Step-through pattern;Decreased step length - right;Decreased step length - left Gait velocity: decreased   General Gait Details: Step distance improved as gait progressed, but overall pt with unsteadiness and needs MIN A for safety   Stairs            Wheelchair Mobility     Modified Rankin (Stroke Patients Only)       Balance Overall balance assessment: Needs assistance Sitting-balance support: Feet supported;No upper extremity supported Sitting balance-Leahy Scale: Fair     Standing balance support: No upper extremity supported;During functional activity Standing balance-Leahy Scale: Fair                              Cognition Arousal/Alertness: Awake/alert Behavior During Therapy: WFL for tasks assessed/performed Overall Cognitive Status: No family/caregiver present to determine baseline cognitive functioning Area of Impairment: Attention;Memory;Safety/judgement;Problem solving                   Current Attention Level: Sustained Memory: Decreased short-term memory   Safety/Judgement: Decreased awareness of safety   Problem Solving: Slow processing;Decreased initiation;Requires verbal cues;Requires tactile cues;Difficulty sequencing General Comments: Easily distracted and repetitive      Exercises      General Comments        Pertinent Vitals/Pain Pain Assessment: Faces Faces Pain Scale: Hurts a little bit Pain Location: LLE Pain Descriptors / Indicators: Grimacing Pain Intervention(s): Monitored during session    Home Living                      Prior Function            PT Goals (current goals can now be found in the care plan section) Acute Rehab PT Goals Patient Stated Goal: go home PT Goal  Formulation: With patient Time For Goal Achievement: 07/30/16 Potential to Achieve Goals: Good Progress towards PT goals: Progressing toward goals    Frequency    Min 3X/week      PT Plan Discharge plan needs to be updated    Co-evaluation PT/OT/SLP Co-Evaluation/Treatment: Yes Reason for Co-Treatment: For patient/therapist safety;To address functional/ADL transfers PT goals addressed during session: Mobility/safety with mobility;Proper use of DME OT goals addressed during session: ADL's  and self-care     End of Session Equipment Utilized During Treatment: Gait belt Activity Tolerance: Patient tolerated treatment well Patient left: in chair;with call bell/phone within reach;with chair alarm set   PT Visit Diagnosis: Unsteadiness on feet (R26.81);History of falling (Z91.81);Difficulty in walking, not elsewhere classified (R26.2)     Time: 5993-5701 PT Time Calculation (min) (ACUTE ONLY): 25 min  Charges:  $Gait Training: 8-22 mins                    G Codes:       Ronald [name] L. Ronald [name], [place] Pager [phone] 07/21/2016    Ronald [name] 07/21/2016, 11:28 AM

## 2016-07-21 NOTE — Progress Notes (Signed)
Discharge instructions (including medications) discussed with and copy provided to patient/caregiver 

## 2016-07-21 NOTE — Progress Notes (Signed)
Pt given info on heart healthy diet and cooking with less salt. Discussed this info with pt.

## 2016-07-21 NOTE — Progress Notes (Signed)
STROKE TEAM PROGRESS NOTE   SUBJECTIVE (INTERVAL HISTORY) No acute event overnight. 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 Not a candidate for STROKE AF trial determined by cardiology.      OBJECTIVE Temp:  [98.7 F (37.1 C)-99.1 F (37.3 C)] 99 F (37.2 C) (04/25 0700) Pulse Rate:  [58-72] 72 (04/25 1116) Cardiac Rhythm: Normal sinus rhythm (04/25 0730) Resp:  [18] 18 (04/25 0700) BP: (122-138)/(53-68) 127/64 (04/25 1116) SpO2:  [96 %-98 %] 96 % (04/25 0700) Weight:  [245 lb 9.5 oz (111.4 kg)] 245 lb 9.5 oz (111.4 kg) (04/25 0700)  CBC:   Recent Labs Lab 07/14/16 1954 07/16/16 0506 07/21/16 0615  WBC 9.4 6.1 5.2  NEUTROABS 7.5  --  3.5  HGB 11.9* 10.1* 10.8*  HCT 37.0* 33.1* 34.5*  MCV 90.9 91.7 92.7  PLT 276 224 676    Basic Metabolic Panel:  Recent Labs Lab 07/15/16 0827  07/19/16 0934 07/21/16 0615  NA  --   < > 138 137  K  --   < > 4.1 4.1  CL  --   < > 101 102  CO2  --   < > 28 27  GLUCOSE  --   < > 121* 109*  BUN  --   < > 10 13  CREATININE  --   < > 0.89 0.87  CALCIUM  --   < > 9.2 9.1  MG 2.1  --   --   --   PHOS 3.4  --   --   --   < > = values in this interval not displayed.  Lipid Panel:     Component Value Date/Time   CHOL 146 07/17/2016 0743   TRIG 100 07/17/2016 0743   HDL 40 (L) 07/17/2016 0743   CHOLHDL 3.7 07/17/2016 0743   VLDL 20 07/17/2016 0743   LDLCALC 86 07/17/2016 0743   HgbA1c:  Lab Results  Component Value Date   HGBA1C 5.4 07/16/2016   Urine Drug Screen:     Component Value Date/Time   LABOPIA NONE DETECTED 07/14/2016 2135   COCAINSCRNUR NONE DETECTED 07/14/2016 2135   LABBENZ NONE DETECTED 07/14/2016 2135   AMPHETMU NONE DETECTED 07/14/2016 2135   THCU NONE DETECTED 07/14/2016 2135   LABBARB NONE DETECTED 07/14/2016 2135    Alcohol Level     Component Value Date/Time   ETH <5 07/14/2016 1952    IMAGING I have personally reviewed the radiological images below and agree with the radiology  interpretations.  Ct Head Wo Contrast 07/14/2016 1. Mild age advanced atrophy and white matter disease likely reflects the sequela of chronic microvascular ischemia. 2. No acute intracranial abnormality.   Mr Brain Wo Contrast 07/15/2016 1. No acute finding. 2. Asymmetric gliosis in the right cerebral white matter, deep watershed pattern. Asymmetric abnormal appearance of the right ICA, suspect chronic flow limiting stenosis.   Ct Angio Head W Or Wo Contrast Ct Angio Neck W Or Wo Contrast 07/18/2016 1. 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 Distal reconstitution via the posterior communicating artery. 2. Patent right MCA without significant proximal stenosis. 3. Severe right ACA origin stenosis. 4. Severe distal right P1 PCA stenosis. 5. 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   Dg Hip Unilat W Or Wo Pelvis 2-3 Views Left 07/15/2016 Negative.    Dg Knee 1-2 Views Left 07/15/2016 There are mild osteoarthritic changes of all 3 joint compartments. No acute fracture or dislocation.  No significant joint space loss.   Mr Brain Limited Wo Contrast 07/20/2016 IMPRESSION: In this patient with a pattern of chronic watershed ischemia on the right, there are several punctate foci of acute infarction in the right watershed distribution seen on today's study.    Cerebral angio - Right supra-clinoid ICA occlusion, with ICA terminating at the ophthalmic Small caliber right ICA, with no dissection flap or stenosis of the cervical segment. Occluded origin of the right vertebral artery.  No filling of the distal cervical right vertebral from deep cervical or ascending cervical.  Injection of the left vertebral shows reflux into the distal right vertebral.  Patient bilateral PCA.  Left to right perfusion of the right MCA from patent acom and left ICA.   Right MCA perfusion from the right PComm.    PHYSICAL EXAM  Temp:  [98.7 F  (37.1 C)-99.1 F (37.3 C)] 99 F (37.2 C) (04/25 0700) Pulse Rate:  [58-72] 72 (04/25 1116) Resp:  [18] 18 (04/25 0700) BP: (122-138)/(53-68) 127/64 (04/25 1116) SpO2:  [96 %-98 %] 96 % (04/25 0700) Weight:  [245 lb 9.5 oz (111.4 kg)] 245 lb 9.5 oz (111.4 kg) (04/25 0700)  General - Well nourished, well developed, in no apparent distress.  Ophthalmologic - Fundi not visualized due to eye movement.  Cardiovascular - Regular rate and rhythm.  Mental Status -  Level of arousal and orientation to time, place, and person were intact. Language including expression, naming, repetition, comprehension was assessed and found intact. Fund of Knowledge was assessed and was intact.  Cranial Nerves II - XII - II - Visual field intact OU. III, IV, VI - Extraocular movements intact. V - Facial sensation intact bilaterally. VII - Facial movement intact bilaterally. VIII - Hearing & vestibular intact bilaterally. X - Palate elevates symmetrically. XI - Chin turning & shoulder shrug intact bilaterally. XII - Tongue protrusion intact.  Motor Strength - The patient's strength was 4+/5 LUE with decreased dexterity and 2/5 LLE proximal but 3/5 LLE knee extension, 5/5 RUE and RLE, and pronator drift was present on the left.  Bulk was normal and fasciculations were absent.   Motor Tone - Muscle tone was assessed at the neck and appendages and was increased on the LLE.  Reflexes - The patient's reflexes were 1+ in all extremities and he had no pathological reflexes.  Sensory - Light touch, temperature/pinprick and were decreased on the left UE and LE, about 80% of the right.    Coordination - The patient had left FTN mild dysmetria.  Tremor was absent.  Gait and Station - not able to test   ASSESSMENT/PLAN [name] is a 71 y.o. male with history of ementia/severe bipolar disorder, prostate cancer on medical therapy, hypertension, dyslipidemia, and GERD presenting with recent fall, L knee  pain, inability to walk; cough, fever, congestion. Found to have LLL PNA.MRI negative for acute stroke.   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 High grade stenosis  CT head no acute abnormality. small vessel disease  MRI 07/15/16 No acute stroke. R cerebral WM gliosis. Abnormal appearance R ICA  CTA Head/neck read as R ICA occlusion at opthalmic level with distal reconstitution. Severe R ACA origin and distal R P1 stenosis. Occluded R VA or origin.  Repeat MRI right MCA/ACA, MCA/PCA watershed punctate infarcts.  Cerebral angio - right ICA occlusion terminus, right MCA perfusion from right PComm. Right VA origin occlusion  LDL 86  HgbA1c 5.4  Lovenox 40 mg  sq daily for VTE prophylaxis Diet Heart Room service appropriate? Yes; Fluid consistency: Thin  No antithrombotic prior to admission, now on DAPT due to intracranial stenosis. Continue DAPT for 3 months and then plavix alone.   Patient counseled to be compliant with his antithrombotic medications  Ongoing aggressive stroke risk factor management  Therapy recommendations:  SNF  Disposition:  pending   Right ICA occlusion   Right ICA decreased caliber at origin  Occlusion at ophthalmic artery segment  No further intervention needed  Hypertension  Stable at goal  On clonidine, metoprolol and lisinopril  BP goal 130-150 due to right ICA occlusion vs. High grade stenosis  Hyperlipidemia  Home meds:  zocor 40  LDL 86, goal < 70  Now on lipitor 80  Continue statin at discharge  Other Stroke Risk Factors  Advanced age  Obesity, Body mass index is 36.27 kg/m., recommend weight loss, diet and exercise as appropriate   Other Active Problems  CAP on abx x 5 days  rhabdomylosis - improving  L knee osteoarthritis  Dementia/bipolar   Not a candidate for STROKE AF trial as per cardiology  Hospital day # 6   Neurology will sign off. Please call with questions. Pt will follow up  with stroke clinic at [facility] in about 6 weeks. Thanks for the consult.   [name], MD PhD Stroke Neurology 07/21/2016 4:34 PM   To contact Stroke Continuity provider, please refer to [email or web address] After hours, contact General Neurology

## 2016-07-21 NOTE — Progress Notes (Signed)
Occupational Therapy Treatment Patient Details Name: Ronald [name] MRN: [id number] DOB: Sep 16, 1945 Today's Date: 07/21/2016    History of present illness 71 yo male with onset of CAP, fall after L side became weak, R ICA stenosis noted, gliosis of R cerebellar white matter, L knee OA and L LL PNA.   OT comments  Pt able to perform toilet transfer and peri care in standing with min assist. Pt able to perform grooming task x1 standing at the sink with min guard. Practiced bed mobility in flat bed with use of rails and min assist for LLE. Pt refusing SNF; plans to d/c home with 24/7 assist. Educated pt on need for 24/7 supervision upon return home and maximizing functional independence instead of relying on family for assist with ADL. Updated d/c plan to home with [facility] for follow up. Will continue to follow acutely.   Follow Up Recommendations  Home health OT;Supervision/Assistance - 24 hour    Equipment Recommendations  3 in 1 bedside commode;Tub/shower bench    Recommendations for Other Services      Precautions / Restrictions Precautions Precautions: Fall Restrictions Weight Bearing Restrictions: No       Mobility Bed Mobility Overal bed mobility: Needs Assistance Bed Mobility: Supine to Sit;Sit to Supine     Supine to sit: Min assist Sit to supine: Min assist   General bed mobility comments: Assist for LLE into bed. Assist for LLE OOB and HHA for trunk elevation to sitting.  Transfers Overall transfer level: Needs assistance Equipment used: Rolling walker (2 wheeled) Transfers: Sit to/from Stand Sit to Stand: Min assist         General transfer comment: Min assist for balance. Cues for hand placement and technique.    Balance Overall balance assessment: Needs assistance Sitting-balance support: Feet supported;No upper extremity supported Sitting balance-Leahy Scale: Fair     Standing balance support: No upper extremity supported;During functional  activity Standing balance-Leahy Scale: Fair                             ADL either performed or assessed with clinical judgement   ADL Overall ADL's : Needs assistance/impaired     Grooming: Min guard;Standing;Wash/dry hands;Cueing for sequencing               Lower Body Dressing: Minimal assistance;Sit to/from stand   Toilet Transfer: Minimal assistance;Ambulation;BSC;RW   Toileting- Clothing Manipulation and Hygiene: Minimal assistance;Sit to/from stand Toileting - Clothing Manipulation Details (indicate cue type and reason): assist for standing balance     Functional mobility during ADLs: Minimal assistance;Rolling walker;Cueing for sequencing General ADL Comments: Discussed importance of 24/7 close supervision upon return home; pt reports sister in law Suanne Marker) will be with him at all times to assist. Also discussed importance of maximizing functional independence instead of relying on family for assist with everything; pt verbalized understanding.     Vision       Perception     Praxis      Cognition Arousal/Alertness: Awake/alert Behavior During Therapy: WFL for tasks assessed/performed Overall Cognitive Status: No family/caregiver present to determine baseline cognitive functioning Area of Impairment: Attention;Memory;Safety/judgement;Problem solving                   Current Attention Level: Sustained Memory: Decreased short-term memory   Safety/Judgement: Decreased awareness of safety   Problem Solving: Slow processing;Decreased initiation;Requires verbal cues;Requires tactile cues;Difficulty sequencing  Exercises     Shoulder Instructions       General Comments      Pertinent Vitals/ Pain       Pain Assessment: Faces Faces Pain Scale: Hurts a little bit Pain Location: LLE Pain Descriptors / Indicators: Grimacing Pain Intervention(s): Monitored during session  Home Living                                           Prior Functioning/Environment              Frequency  Min 2X/week        Progress Toward Goals  OT Goals(current goals can now be found in the care plan section)  Progress towards OT goals: Progressing toward goals  Acute Rehab OT Goals Patient Stated Goal: go home OT Goal Formulation: With patient  Plan Discharge plan needs to be updated    Co-evaluation    PT/OT/SLP Co-Evaluation/Treatment: Yes Reason for Co-Treatment: For patient/therapist safety;To address functional/ADL transfers PT goals addressed during session: Mobility/safety with mobility OT goals addressed during session: ADL's and self-care      End of Session Equipment Utilized During Treatment: Gait belt;Rolling walker  OT Visit Diagnosis: Unsteadiness on feet (R26.81);Other abnormalities of gait and mobility (R26.89);History of falling (Z91.81)   Activity Tolerance Patient tolerated treatment well   Patient Left in chair;with call bell/phone within reach;with chair alarm set   Nurse Communication          Time: [id number] OT Time Calculation (min): 26 min  Charges: OT General Charges $OT Visit: 1 Procedure OT Treatments $Self Care/Home Management : 8-22 mins  Kelvis Berger A. Ulice Brilliant, M.S., OTR/L Pager: [place] 07/21/2016, 11:02 AM

## 2016-07-21 NOTE — Discharge Summary (Signed)
Neno Hohensee, is a 71 y.o. male  DOB [birth year]  MRN [id number].  Admission date:  07/14/2016  Admitting Physician  Sid Falcon, MD  Discharge Date:  07/21/2016   Primary MD  Leeanne Rio, PA-C  Recommendations for primary care physician for things to follow:    1. Community acquired pneumonia. Left lower lung Completed levaquin iv, recommend a repeat CXR in 4 weeks for resolution  2. 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 With left sided weakness which is improving per patient Severe intracranial stenosis, Cont clopidogrel/asa/ statin.  dual therapy with aspirin and plavix for 3 months and then plavix alone f/u with neurology in 1-2 weeks.   3. HTN.  discontinued hydrochlorothiazide. bp has been controlled on clonidine, metoprolol, and lisinopril pcp to make sure bp well controlled  4. Rhabdomyolysis. Resolved  5. Left knee osteoarthritis. Continue pain control and physical therapy evaluation.   6. Dementia/ bipolar.  Continue abilify.      Admission Diagnosis  Weakness of left leg [R29.898] Community acquired pneumonia of left lower lobe of lung ([place]) [J18.1]   Discharge Diagnosis  Weakness of left leg [R29.898] Community acquired pneumonia of left lower lobe of lung ([place]) [J18.1]    Principal Problem:   CAP (community acquired pneumonia) Active Problems:   Hyperlipidemia   GERD (gastroesophageal reflux disease)   Bipolar 1 disorder ([place])   Essential hypertension   Prostate cancer ([place])   Dementia   Acute left-sided weakness   Abnormal urinalysis   Effusion of left knee   Elevated AST (SGOT)   Elevated troponin   Lobar pneumonia ([facility])   Non-traumatic rhabdomyolysis   Cerebral thrombosis with cerebral infarction      Past Medical History:  Diagnosis Date  . Cancer ([place])   . Chicken pox   . Dementia   . Depressed    . Hyperlipemia   . Hypertension   . Measles   . Mumps   . 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 IR ANGIO EXTERNAL CAROTID SEL EXT CAROTID UNI R MOD SED  07/20/2016  . IR ANGIO INTRA EXTRACRAN SEL INTERNAL CAROTID BILAT MOD SED  07/20/2016  . IR ANGIO VERTEBRAL SEL SUBCLAVIAN INNOMINATE UNI L MOD SED  07/20/2016  . IR ANGIOGRAM EXTREMITY RIGHT  07/20/2016  . Unremarkable         HPI  from the history and physical done on the day of admission:   71 yo male presented to the hospital with the chief complain of ambulatory dysfunction, fever, congestion and left knee pain. Know to have dementia and bipolar. Multiple falls at home with right sided weakness. On the initial physical examination, hemodynamically stable with evident left sided weakness. Chest film positive for infiltrate. Admitted with community acquired pneumonia complicated with possible CVA. MRI with ischemic changes.       Hospital Course:      pt was admitted and found  to have CVA,  And pneumonia,  Started on levaquin iv   MRI brain 4/19=> Asymmetric gliosis in the right cerebral white matter, deep watershed pattern. Asymmetric abnormal appearance of the right ICA, suspect chronic flow limiting stenosis.  Neurology consulted by ED, not a tpa, candidate.  Started on aspirin and plavix.     CTA showed occlusion of the R ICA 4/22=> 1. 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 Distal reconstitution via the posterior communicating artery. 2. Patent right MCA without significant proximal stenosis. 3. Severe right ACA origin stenosis. 4. Severe distal right P1 PCA stenosis. 5. 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  Due to R ica occlusion pt scheduled for IR Angio 4/24=> Status post cervical and cerebral angiogram demonstrating relatively small caliber cervical segment of  the right ICA, which is uniform from the bifurcation to the ophthalmic segment. 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  Perfusion of the right MCA from cross filling of the left intracranial ICA via patent anterior communicating artery, as well as retrograde flow through patent right posterior communicating artery.  Questionable atherosclerotic changes in the distal right M1 segment, with less than 50% stenosis.  No intervention performed.  Pt doing well .  Pt has weakness in the left upper and and left lower ext.  protonator drif on the left.  Muscle tone slightly increased on the left lower ext.   Dominant left vertebral artery perfuses bilateral PCA.  Pt doing well and would like to go home with PT.   Follow UP  Follow-up Information    Leeanne Rio, PA-C Follow up in 1 week(s).   Specialty:  Family Medicine Contact information: [address] [place]  [address] [phone]        Xu,Jindong, MD Follow up in 1 week(s).   Specialty:  Neurology Contact information: [address] [place] [address] [facility].   Why:  3 in 1/ BSC and rolling walker will be delivered to bedside prior to discharge. Contact information: [address] [place] [address] [phone]        [place] .   Contact information: [place] [address] [phone]            Consults obtained - neurology, IR  Discharge Condition: stable  Diet and Activity recommendation: See Discharge Instructions below  Discharge Instructions     Discharge Instructions    DME Hospital bed    Complete by:  As  directed    Patient has (list medical condition):  stroke   The above medical condition requires:  Patient requires the ability to reposition frequently   Bed type:  Semi-electric   DME Other see comment    Complete by:  As directed    Wheel chair  Dx Stroke   DME Other see comment    Complete by:  As directed    Walker  Dx stroke        Discharge Medications     Allergies as of 07/21/2016      Reactions   Dust Mite Extract Other (See Comments)   Allergy symptoms  Pollen Extract Other (See Comments)   Allergy symptoms   Rye Grass Flower Pollen Extract [gramineae Pollens] Other (See Comments)   Allergy Symptoms      Medication List    STOP taking these medications   hydrochlorothiazide 25 MG tablet Commonly known as:  HYDRODIURIL   simvastatin 40 MG tablet Commonly known as:  ZOCOR     TAKE these medications   ARIPiprazole 10 MG tablet Commonly known as:  ABILIFY TAKE 1 TABLET BY MOUTH EVERY DAY   aspirin 325 MG EC tablet Take 1 tablet (325 mg total) by mouth daily. Start taking on:  07/22/2016   atorvastatin 80 MG tablet Commonly known as:  LIPITOR Take 1 tablet (80 mg total) by mouth daily at 6 PM.   Calcium-Vitamin D 600-200 MG-UNIT tablet Take 1 tablet by mouth daily.   cloNIDine 0.1 MG tablet Commonly known as:  CATAPRES Take 1 tablet (0.1 mg total) by mouth 3 (three) times daily.   clopidogrel 75 MG tablet Commonly known as:  PLAVIX Take 1 tablet (75 mg total) by mouth daily. Start taking on:  07/22/2016   finasteride 5 MG tablet Commonly known as:  PROSCAR Take 1 tablet (5 mg total) by mouth daily. Start taking on:  07/22/2016   fluticasone 50 MCG/ACT nasal spray Commonly known as:  FLONASE Place 2 sprays into both nostrils daily.   lisinopril 30 MG tablet Commonly known as:  PRINIVIL,ZESTRIL TAKE 1 TABLET (30 MG TOTAL) BY MOUTH DAILY.   metoprolol 50 MG tablet Commonly known as:  LOPRESSOR TAKE 1 TABLET BY MOUTH TWICE A DAY    oxyCODONE 5 MG immediate release tablet Commonly known as:  Oxy IR/ROXICODONE Take 1 tablet (5 mg total) by mouth every 4 (four) hours as needed for moderate pain.   potassium chloride SA 20 MEQ tablet Commonly known as:  KLOR-CON M20 TAKE 1 TABLET (20 MEQ TOTAL) BY MOUTH DAILY.   ranitidine 150 MG capsule Commonly known as:  ZANTAC TAKE ONE CAPSULE BY MOUTH TWICE A DAY            Durable Medical Equipment        Start     Ordered   07/21/16 1528  For home use only DME high strength lightweight manual wheelchair with seat cushion  Once    Comments:  Patient suffers from stroke which impairs their ability to perform daily activities like bathing in the home.  A cane will not resolve  issue with performing activities of daily living. A wheelchair will allow patient to safely perform daily activities.  (THEN ONE OF THESE TWO:) Patient self-propels the wheelchair while engaging in frequent activities such as meals and toileting which cannot be performed in a standard or lightweight wheelchair due to the weight of the chair. Accessories: elevating leg rests (ELRs), wheel locks, extensions and anti-tippers.   07/21/16 1528   07/21/16 1526  For home use only DME Walker rolling  Once    Question:  Patient needs a walker to treat with the following condition  Answer:  Stroke (cerebrum) ([place])   07/21/16 1528   07/21/16 1504  For home use only DME Bedside commode  Once    Question:  Patient needs a bedside commode to treat with the following condition  Answer:  Stroke [facility])   07/21/16 1503   07/21/16 [facility] bed    Question Answer Comment  Patient has (list medical condition): stroke   The above medical condition requires: Patient requires  the ability to reposition frequently   Bed type Semi-electric      07/21/16 1509   07/21/16 0000  DME Other see comment    Comments:  Wheel chair  Dx Stroke   07/21/16 1509   07/21/16 0000  DME Other see comment    Comments:  Gilford Rile   Dx stroke   07/21/16 1509      Major procedures and Radiology Reports - PLEASE review detailed and final reports for all details, in brief -      Ct Angio Head W Or Wo Contrast  Result Date: 07/18/2016 CLINICAL DATA:  Acute left hemiparesis. EXAM: CT ANGIOGRAPHY HEAD AND NECK TECHNIQUE: Multidetector CT imaging of the head and neck was performed using the standard protocol during bolus administration of intravenous contrast. Multiplanar CT image reconstructions and MIPs were obtained to evaluate the vascular anatomy. Carotid stenosis measurements (when applicable) are obtained utilizing NASCET criteria, using the distal internal carotid diameter as the denominator. CONTRAST:  50 mL Isovue 370 COMPARISON:  Brain MRI 07/15/2016 and CT 07/14/2016. No prior angiographic imaging. FINDINGS: CT HEAD FINDINGS Brain: There is no evidence of acute cortical infarct, intracranial hemorrhage, mass, midline shift, or extra-axial fluid collection. Mild right cerebral white matter hypoattenuation is again noted predominantly at the level of the centrum semiovale corresponding to the watershed pattern gliosis described on MRI. There is slight enlargement of the right lateral ventricle. Vascular: Calcified atherosclerosis at the skullbase. Skull: No fracture or focal osseous lesion. Sinuses: Minimal right maxillary sinus mucosal thickening. Clear mastoid air cells. Orbits: Unremarkable orbits. Review of the MIP images confirms the above findings CTA NECK FINDINGS Aortic arch: Standard 3 vessel aortic arch with mild-to-moderate calcified and soft plaque. Brachiocephalic and subclavian arteries are widely patent. Right carotid system: Widely patent common carotid artery with mild atherosclerotic luminal irregularity. Widely patent ICA origin with mild calcified plaque. The ICA abruptly tapers in caliber beyond the bulb and is patent but diffusely small throughout the remainder of the neck without focal stenosis or  dissection flap identified. This likely reflects decreased flow due to the distal occlusion, although an underlying chronic dissection is not excluded. Left carotid system: Patent without evidence of stenosis or dissection. Mild soft plaque in the bulb. Vertebral arteries: The left vertebral artery is patent without evidence of dissection or significant stenosis. The right vertebral artery is occluded at its origin. There is distal reconstitution of diminutive distal V2 and V3 segments at the C2-3 level. Skeleton: Mild cervical spondylosis. Other neck: Poor dentition with multiple caries and periapical lucencies. No neck mass. Upper chest: Clear lung apices. Review of the MIP images confirms the above findings CTA HEAD FINDINGS Anterior circulation: The intracranial right ICA is patent in the petrous and cavernous segments but occludes at the ophthalmic level. There is distal reconstitution via the right posterior communicating artery. The intracranial left ICA is patent with mild atherosclerotic plaque resulting in mild proximal cavernous stenosis. ACAs are patent with a severe origin stenosis noted on the right. MCAs are patent with mild-to-moderate branch vessel irregularity but no evidence of proximal branch occlusion or significant proximal stenosis. Moderate ACA branch vessel irregularity is also noted. No aneurysm. Posterior circulation: The intracranial left vertebral artery is patent with mild atherosclerotic irregularity but no significant stenosis. The intracranial right vertebral artery is grossly patent but extremely small in caliber. PICAs and SCAs are grossly patent proximally. The basilar artery is patent and mildly irregular without significant stenosis. There are right larger than left posterior  communicating artery is with both P1 segments appearing mildly hypoplastic. There is a moderate to severe distal right P1 stenosis. Left PCA is patent without significant proximal stenosis. No aneurysm.  Venous sinuses: Patent. Anatomic variants: None. Delayed phase: No abnormal enhancement. Review of the MIP images confirms the above findings IMPRESSION: 1. 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 Distal reconstitution via the posterior communicating artery. 2. Patent right MCA without significant proximal stenosis. 3. Severe right ACA origin stenosis. 4. Severe distal right P1 PCA stenosis. 5. 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 Electronically Signed   By: Logan Bores M.D.   On: 07/18/2016 12:25   Dg Chest 2 View  Result Date: 07/16/2016 CLINICAL DATA:  Followup pneumonia.  Asymptomatic, but has dementia EXAM: CHEST  2 VIEW COMPARISON:  07/14/2016 FINDINGS: Mild cardiomegaly. Mediastinal contours are distorted by rightward rotation. No acute infiltrate or edema. No effusion or pneumothorax. No acute osseous findings. IMPRESSION: Lungs appear clear today.  No evidence of pneumonia. Electronically Signed   By: Monte Fantasia M.D.   On: 07/16/2016 08:38   Dg Chest 2 View  Result Date: 07/14/2016 CLINICAL DATA:  Persistent cough. The cough is intermittently productive. EXAM: CHEST  2 VIEW COMPARISON:  Chest and rib radiographs 07/16/2015 FINDINGS: The heart size is exaggerated by low lung volumes. Left lower lobe pneumonia is present. The right lung is clear. A sclerotic changes are noted at the aortic arch. There is no significant effusion or edema. The visualized soft tissues and bony thorax are unremarkable. IMPRESSION: 1. Left lower lobe pneumonia. 2. Atherosclerosis of the aorta. Electronically Signed   By: San Morelle M.D.   On: 07/14/2016 21:53   Dg Knee 1-2 Views Left  Result Date: 07/15/2016 CLINICAL DATA:  Onset of left knee pain yesterday with stiffness. Improved range of motion today. No known injury. EXAM: LEFT KNEE - 1-2 VIEW COMPARISON:  None in PACs FINDINGS: The bones are subjectively adequately  mineralized. There is beaking of the tibial spines. Small spurs arise from the articular margins of the femoral condyles and tibial plateaus. There are spurs associated with the superior and inferior articular margins of the patella. There is no acute fracture or dislocation. There is no joint effusion. IMPRESSION: There are mild osteoarthritic changes of all 3 joint compartments. No acute fracture or dislocation. No significant joint space loss. Electronically Signed   By: David  Martinique M.D.   On: 07/15/2016 10:49   Ct Head Wo Contrast  Result Date: 07/14/2016 CLINICAL DATA:  Increased dizziness and slurred speech today. [place] out of chair twice. Initial encounter. EXAM: CT HEAD WITHOUT CONTRAST TECHNIQUE: Contiguous axial images were obtained from the base of the skull through the vertex without intravenous contrast. COMPARISON:  None. FINDINGS: Brain: Mild atrophy and white matter changes are present bilaterally. No acute infarct, hemorrhage, or mass lesion is present. The basal ganglia are intact. Insular ribbon is normal. No focal cortical defect is evident. The ventricles are proportionate to the degree of atrophy. No significant extra-axial fluid collection is present. Incidental calcifications are noted at the ideal gland. Vascular: Negative Skull: Calvarium is intact. No focal lytic or blastic lesions are present. Sinuses/Orbits: The paranasal sinuses an mastoid air cells are clear. The globes and orbits are unremarkable. IMPRESSION: 1. Mild age advanced atrophy and white matter disease likely reflects the sequela of chronic microvascular ischemia. 2. No acute intracranial abnormality. Electronically Signed   By: Wynetta Fines.D.  On: 07/14/2016 21:57   Ct Angio Neck W Or Wo Contrast  Result Date: 07/18/2016 CLINICAL DATA:  Acute left hemiparesis. EXAM: CT ANGIOGRAPHY HEAD AND NECK TECHNIQUE: Multidetector CT imaging of the head and neck was performed using the standard protocol during  bolus administration of intravenous contrast. Multiplanar CT image reconstructions and MIPs were obtained to evaluate the vascular anatomy. Carotid stenosis measurements (when applicable) are obtained utilizing NASCET criteria, using the distal internal carotid diameter as the denominator. CONTRAST:  50 mL Isovue 370 COMPARISON:  Brain MRI 07/15/2016 and CT 07/14/2016. No prior angiographic imaging. FINDINGS: CT HEAD FINDINGS Brain: There is no evidence of acute cortical infarct, intracranial hemorrhage, mass, midline shift, or extra-axial fluid collection. Mild right cerebral white matter hypoattenuation is again noted predominantly at the level of the centrum semiovale corresponding to the watershed pattern gliosis described on MRI. There is slight enlargement of the right lateral ventricle. Vascular: Calcified atherosclerosis at the skullbase. Skull: No fracture or focal osseous lesion. Sinuses: Minimal right maxillary sinus mucosal thickening. Clear mastoid air cells. Orbits: Unremarkable orbits. Review of the MIP images confirms the above findings CTA NECK FINDINGS Aortic arch: Standard 3 vessel aortic arch with mild-to-moderate calcified and soft plaque. Brachiocephalic and subclavian arteries are widely patent. Right carotid system: Widely patent common carotid artery with mild atherosclerotic luminal irregularity. Widely patent ICA origin with mild calcified plaque. The ICA abruptly tapers in caliber beyond the bulb and is patent but diffusely small throughout the remainder of the neck without focal stenosis or dissection flap identified. This likely reflects decreased flow due to the distal occlusion, although an underlying chronic dissection is not excluded. Left carotid system: Patent without evidence of stenosis or dissection. Mild soft plaque in the bulb. Vertebral arteries: The left vertebral artery is patent without evidence of dissection or significant stenosis. The right vertebral artery is occluded  at its origin. There is distal reconstitution of diminutive distal V2 and V3 segments at the C2-3 level. Skeleton: Mild cervical spondylosis. Other neck: Poor dentition with multiple caries and periapical lucencies. No neck mass. Upper chest: Clear lung apices. Review of the MIP images confirms the above findings CTA HEAD FINDINGS Anterior circulation: The intracranial right ICA is patent in the petrous and cavernous segments but occludes at the ophthalmic level. There is distal reconstitution via the right posterior communicating artery. The intracranial left ICA is patent with mild atherosclerotic plaque resulting in mild proximal cavernous stenosis. ACAs are patent with a severe origin stenosis noted on the right. MCAs are patent with mild-to-moderate branch vessel irregularity but no evidence of proximal branch occlusion or significant proximal stenosis. Moderate ACA branch vessel irregularity is also noted. No aneurysm. Posterior circulation: The intracranial left vertebral artery is patent with mild atherosclerotic irregularity but no significant stenosis. The intracranial right vertebral artery is grossly patent but extremely small in caliber. PICAs and SCAs are grossly patent proximally. The basilar artery is patent and mildly irregular without significant stenosis. There are right larger than left posterior communicating artery is with both P1 segments appearing mildly hypoplastic. There is a moderate to severe distal right P1 stenosis. Left PCA is patent without significant proximal stenosis. No aneurysm. Venous sinuses: Patent. Anatomic variants: None. Delayed phase: No abnormal enhancement. Review of the MIP images confirms the above findings IMPRESSION: 1. 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 Distal reconstitution via the posterior communicating artery. 2. Patent right MCA without significant proximal stenosis. 3. Severe right ACA origin stenosis. 4. Severe distal right  P1 PCA  stenosis. 5. 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 Electronically Signed   By: Logan Bores M.D.   On: 07/18/2016 12:25   Mr Brain Wo Contrast  Result Date: 07/15/2016 CLINICAL DATA:  Left-sided weakness.  History of prostate cancer. EXAM: MRI HEAD WITHOUT CONTRAST TECHNIQUE: Multiplanar, multiecho pulse sequences of the brain and surrounding structures were obtained without intravenous contrast. COMPARISON:  Head CT from yesterday FINDINGS: Brain: No acute infarction, hemorrhage, hydrocephalus, extra-axial collection or mass lesion. Asymmetric white matter gliosis roughly aligned along the right lateral ventricle. The right lateral ventricle is slightly larger than the left, see coronal T2 acquisition. Few small remote infarcts in the peripheral right cerebellum. Vascular: Asymmetric abnormal signal in the right ICA, suspect stenosis and slow flow. Skull and upper cervical spine: Negative Sinuses/Orbits: Negative Other: Motion degraded study which could obscure pathology. IMPRESSION: 1. No acute finding. 2. Asymmetric gliosis in the right cerebral white matter, deep watershed pattern. Asymmetric abnormal appearance of the right ICA, suspect chronic flow limiting stenosis. Electronically Signed   By: Monte Fantasia M.D.   On: 07/15/2016 13:32   Ir Angiogram Extremity Right  Result Date: 07/20/2016 INDICATION: 71 year old male with a history of left-sided weakness and evidence of right-sided clinical stroke. Prior MRI demonstrates diffusion restriction in a watershed distribution of the right hemisphere. EXAM: BILATERAL COMMON CAROTID AND INNOMINATE ANGIOGRAPHY AND BILATERAL VERTEBRAL ARTERY ANGIOGRAMS MEDICATIONS: None ANESTHESIA/SEDATION: Versed 1.0 mg IV; Fentanyl 25 mcg IV Moderate Sedation Time:  55 The patient was continuously monitored during the procedure by the interventional radiology nurse under my direct supervision. FLUOROSCOPY TIME:   Fluoroscopy Time: 12 minutes 30 seconds 2950 mGy). COMPLICATIONS: None TECHNIQUE: Informed written consent was obtained from the patient after a thorough discussion of the procedural risks, benefits and alternatives. All questions were addressed. Maximal Sterile Barrier Technique was utilized including caps, mask, sterile gowns, sterile gloves, sterile drape, hand hygiene and skin antiseptic. A timeout was performed prior to the initiation of the procedure. PROCEDURE: The right inguinal region was prepped and draped in the usual sterile fashion. 1% lidocaine was used for local anesthesia. Ultrasound survey of the right inguinal region was performed with images stored and sent to PACs. A micropuncture needle was used access the right common femoral artery under ultrasound. With excellent arterial blood flow returned, and an .018 micro wire was passed through the needle, observed enter the abdominal aorta under fluoroscopy. The needle was removed, and a micropuncture sheath was placed over the wire. The inner dilator and wire were removed, and an 035 Bentson wire was advanced under fluoroscopy into the abdominal aorta. The sheath was removed and a standard 5 [place] vascular sheath was placed. The dilator was removed and the sheath was flushed. A 5 French JB1 catheter was advanced to the aortic arch region, wire was removed, and double flush was achieved. Catheter was then positioned into the origin of the innominate artery, with roadmap angiogram performed. Catheter was advanced into the subclavian artery over 035 roadrunner wire. Wire was removed and the catheter was withdrawn to the origin of the vertebral artery. Non selective injection of the subclavian artery was performed, with images acquired at the neck and intracranial. Catheter was then withdrawn, double flush was achieved, and the catheter was advanced over the wire into the common carotid artery. Angiogram of the cervical and of the intracranial segments  was performed. Independent sub selection of both the external carotid artery and internal  carotid artery were performed with cervical and cranial segment angiogram performed. Catheter was then withdrawn to the aortic arch, with double flush performed. Left common carotid artery was selected, and the catheter was advanced over the wire. Left common carotid artery angiogram was performed. Independent sub selection of the internal carotid artery was then performed, with angiogram of the cervical and cranial segments. Catheter was withdrawn to the aortic arch with double flush. Left subclavian artery was then selected, with non selective injection of the left vertebral artery origin. Angiogram of the cervical and intracranial segments performed. Catheter was withdrawn from the sheath. Angiogram of the right common femoral artery sheath performed. Exoseal closure was performed. Patient tolerated the procedure well and remained hemodynamically stable throughout. No complications were encountered and no significant blood loss. FINDINGS: Innominate artery: Tortuous innominate artery with no significant atherosclerotic plaque. Subclavian artery remains patent. 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 There is patency of the ascending cervical artery from the thyrocervical trunk, with demonstrates subtle collateral filling of the distal cervical segment of the right vertebral artery as well as the intracranial segment to the vertebrobasilar junction. No significant contribution from the deep cervical artery at this time. Right common carotid artery:  Normal course caliber and contour. Right external carotid artery: Patent with antegrade flow. Selective injection demonstrates no significant contribution to the right hemisphere. Right internal carotid artery: Selective injection of the right internal carotid artery demonstrates  uniform small caliber cervical segment from the bifurcation to the skullbase. No dissection flap or significant stenosis. No irregularity of the lumen to suggest thrombus. The vertical and horizontal petrous segment are patent. Last all segment patent. Cavernous segment patent. The intracranial ICA is patent to the level of the clinoid and terminates at the ophthalmic artery which demonstrates antegrade flow. Beyond this, the artery is occluded with no flow. There is questionable development of Vasa those or Um beyond the occlusion, with no significant contribution to the M1 segment. No meniscus or tapered lumen. Right MCA: No filling of the MCA via the right ICA injection. Right MCA fills from a left-to-right flow across patent anterior communicating artery. There appears to be a irregular stenosis of less than 50% narrowing of the distal M1 segment, corresponding to findings on prior CT scan. Right ACA: No filling of the right ACA via the right IC injection. Right-sided ACA fills via the left or right cross flow, with slightly delayed perfusion through the late arterial and capillary phase compared to the left. Left common carotid artery:  Normal course caliber and contour. Left external carotid artery: Patent with antegrade flow. Left internal carotid artery: Normal course caliber and contour of the cervical segment of the left ICA. Caliber is uniform, larger than the right. Left intracranial ICA patent. Antegrade flow of the left ophthalmic artery. Adequate flow through to the ICA terminus with filling of the M1 and A1 segments. Left MCA: M1 segment patent. Insular and opercular segments patent. Unremarkable caliber and course of the cortical segments. Typical arterial, capillary/ parenchymal, and venous phase. Left ACA: A1 segment patent. Anterior communicating artery is patent with filling of the right MCA from cross flow. Right a 2 segment filling the right ACA territory. Perfusion slightly delayed compared  to the left hemisphere. Left vertebral artery: Unremarkable course caliber and contour of the left vertebral artery from the subclavian origin to the skullbase, with no significant stenosis, dissection flap, or regularity.  Injection demonstrates reflux into the right distal vertebral artery at the vertebrobasilar junction, demonstrating small caliber distal right vertebral artery at the junction. Retrograde flow through patent right-sided posterior communicating artery contributes to perfusion of the right MCA territory. Bilateral posterior communicating arteries patent. Tortuous basilar artery with patency of the bilateral posterior cerebral arteries maintained. Right-sided posterior inferior cerebellar artery not visualized. Unremarkable appearance of the right-sided anterior inferior cerebellar artery. Unremarkable appearance of the left posterior inferior cerebellar artery. Small caliber superior cerebellar arteries. IMPRESSION: Status post cervical and cerebral angiogram demonstrating relatively small caliber cervical segment of the right ICA, which is uniform from the bifurcation to the ophthalmic segment. 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 Perfusion of the right MCA from cross filling of the left intracranial ICA via patent anterior communicating artery, as well as retrograde flow through patent right posterior communicating artery. Questionable atherosclerotic changes in the distal right M1 segment, with less than 50% stenosis. Dominant left vertebral artery perfuses bilateral PCA. These results were called by telephone at the time of interpretation on 07/20/2016 at 4:56 pm to Dr. [name] , who verbally acknowledged these results. Signed, Dulcy Fanny. Earleen Newport, DO [facility] Radiology Electronically Signed   By: Corrie Mckusick D.O.   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 Left hemiparesis. EXAM: MRI HEAD WITHOUT CONTRAST TECHNIQUE: Multiplanar, multiecho pulse sequences of the brain and surrounding structures were obtained without intravenous contrast. COMPARISON:  MRI 07/15/2016.  CT angiography 07/18/2016. FINDINGS: Brain: Diffusion imaging shows scattered punctate acute infarctions along the watershed distribution on the right. There are 3 small foci in the temporal occipital region an a few punctate foci in the frontal and parietal regions. Chronic gliosis in the right watershed distribution remains evident. No sign of hemorrhage. No sign of swelling. No hydrocephalus or extra-axial collection. Vascular: Limited vascular data due to reduced number of sequences performed. Skull and upper cervical spine: No additional data. 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 Electronically Signed   By: Nelson Chimes M.D.   On: 07/20/2016 08:42   Dg Hip Unilat W Or Wo Pelvis 2-3 Views Left  Result Date: 07/15/2016 CLINICAL DATA:  Left hip pain after a fall last night. EXAM: DG HIP (WITH OR WITHOUT PELVIS) 2-3V LEFT COMPARISON:  None. FINDINGS: There is no evidence of hip fracture or dislocation. There is no evidence of arthropathy or other focal bone abnormality. IMPRESSION: Negative. Electronically Signed   By: Lucienne Capers M.D.   On: 07/15/2016 02:48   Ir Angio Intra Extracran Sel Internal Carotid Bilat Mod Sed  Result Date: 07/20/2016 INDICATION: 71 year old male with a history of left-sided weakness and evidence of  right-sided clinical stroke. Prior MRI demonstrates diffusion restriction in a watershed distribution of the right hemisphere. EXAM: BILATERAL COMMON CAROTID AND INNOMINATE ANGIOGRAPHY AND BILATERAL VERTEBRAL ARTERY ANGIOGRAMS MEDICATIONS: None ANESTHESIA/SEDATION: Versed 1.0 mg IV; Fentanyl 25 mcg IV Moderate Sedation Time:  55 The patient was continuously monitored during the procedure by the interventional radiology nurse under my  direct supervision. FLUOROSCOPY TIME:  Fluoroscopy Time: 12 minutes 30 seconds 2950 mGy). COMPLICATIONS: None TECHNIQUE: Informed written consent was obtained from the patient after a thorough discussion of the procedural risks, benefits and alternatives. All questions were addressed. Maximal Sterile Barrier Technique was utilized including caps, mask, sterile gowns, sterile gloves, sterile drape, hand hygiene and skin antiseptic. A timeout was performed prior to the initiation of the procedure. PROCEDURE: The right inguinal region was prepped and draped in the usual sterile fashion. 1% lidocaine was used for local anesthesia. Ultrasound survey of the right inguinal region was performed with images stored and sent to PACs. A micropuncture needle was used access the right common femoral artery under ultrasound. With excellent arterial blood flow returned, and an .018 micro wire was passed through the needle, observed enter the abdominal aorta under fluoroscopy. The needle was removed, and a micropuncture sheath was placed over the wire. The inner dilator and wire were removed, and an 035 Bentson wire was advanced under fluoroscopy into the abdominal aorta. The sheath was removed and a standard 5 [place] vascular sheath was placed. The dilator was removed and the sheath was flushed. A 5 French JB1 catheter was advanced to the aortic arch region, wire was removed, and double flush was achieved. Catheter was then positioned into the origin of the innominate artery, with roadmap angiogram  performed. Catheter was advanced into the subclavian artery over 035 roadrunner wire. Wire was removed and the catheter was withdrawn to the origin of the vertebral artery. Non selective injection of the subclavian artery was performed, with images acquired at the neck and intracranial. Catheter was then withdrawn, double flush was achieved, and the catheter was advanced over the wire into the common carotid artery. Angiogram of the cervical and of the intracranial segments was performed. Independent sub selection of both the external carotid artery and internal carotid artery were performed with cervical and cranial segment angiogram performed. Catheter was then withdrawn to the aortic arch, with double flush performed. Left common carotid artery was selected, and the catheter was advanced over the wire. Left common carotid artery angiogram was performed. Independent sub selection of the internal carotid artery was then performed, with angiogram of the cervical and cranial segments. Catheter was withdrawn to the aortic arch with double flush. Left subclavian artery was then selected, with non selective injection of the left vertebral artery origin. Angiogram of the cervical and intracranial segments performed. Catheter was withdrawn from the sheath. Angiogram of the right common femoral artery sheath performed. Exoseal closure was performed. Patient tolerated the procedure well and remained hemodynamically stable throughout. No complications were encountered and no significant blood loss. FINDINGS: Innominate artery: Tortuous innominate artery with no significant atherosclerotic plaque. Subclavian artery remains patent. 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 There is patency of the ascending cervical artery from the thyrocervical trunk, with demonstrates subtle collateral filling of the distal cervical segment of the  right vertebral artery as well as the intracranial segment to the vertebrobasilar junction. No significant contribution from the deep cervical artery at this time. Right common carotid artery:  Normal course caliber and contour. Right external carotid artery: Patent with antegrade flow. Selective injection demonstrates no significant contribution to the right hemisphere. Right internal carotid artery: Selective injection of the right internal carotid artery demonstrates uniform small caliber cervical segment from the bifurcation to the skullbase. No dissection flap or significant stenosis. No irregularity  of the lumen to suggest thrombus. The vertical and horizontal petrous segment are patent. Last all segment patent. Cavernous segment patent. The intracranial ICA is patent to the level of the clinoid and terminates at the ophthalmic artery which demonstrates antegrade flow. Beyond this, the artery is occluded with no flow. There is questionable development of Vasa those or Um beyond the occlusion, with no significant contribution to the M1 segment. No meniscus or tapered lumen. Right MCA: No filling of the MCA via the right ICA injection. Right MCA fills from a left-to-right flow across patent anterior communicating artery. There appears to be a irregular stenosis of less than 50% narrowing of the distal M1 segment, corresponding to findings on prior CT scan. Right ACA: No filling of the right ACA via the right IC injection. Right-sided ACA fills via the left or right cross flow, with slightly delayed perfusion through the late arterial and capillary phase compared to the left. Left common carotid artery:  Normal course caliber and contour. Left external carotid artery: Patent with antegrade flow. Left internal carotid artery: Normal course caliber and contour of the cervical segment of the left ICA. Caliber is uniform, larger than the right. Left intracranial ICA patent. Antegrade flow of the left ophthalmic  artery. Adequate flow through to the ICA terminus with filling of the M1 and A1 segments. Left MCA: M1 segment patent. Insular and opercular segments patent. Unremarkable caliber and course of the cortical segments. Typical arterial, capillary/ parenchymal, and venous phase. Left ACA: A1 segment patent. Anterior communicating artery is patent with filling of the right MCA from cross flow. Right a 2 segment filling the right ACA territory. Perfusion slightly delayed compared to the left hemisphere. Left vertebral artery: Unremarkable course caliber and contour of the left vertebral artery from the subclavian origin to the skullbase, with no significant stenosis, dissection flap, or regularity. Injection demonstrates reflux into the right distal vertebral artery at the vertebrobasilar junction, demonstrating small caliber distal right vertebral artery at the junction. Retrograde flow through patent right-sided posterior communicating artery contributes to perfusion of the right MCA territory. Bilateral posterior communicating arteries patent. Tortuous basilar artery with patency of the bilateral posterior cerebral arteries maintained. Right-sided posterior inferior cerebellar artery not visualized. Unremarkable appearance of the right-sided anterior inferior cerebellar artery. Unremarkable appearance of the left posterior inferior cerebellar artery. Small caliber superior cerebellar arteries. IMPRESSION: Status post cervical and cerebral angiogram demonstrating relatively small caliber cervical segment of the right ICA, which is uniform from the bifurcation to the ophthalmic segment. 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 Perfusion of the right MCA from cross filling of the left  intracranial ICA via patent anterior communicating artery, as well as retrograde flow through patent right posterior communicating artery. Questionable atherosclerotic changes in the distal right M1 segment, with less than 50% stenosis. Dominant left vertebral artery perfuses bilateral PCA. These results were called by telephone at the time of interpretation on 07/20/2016 at 4:56 pm to Dr. [name] , who verbally acknowledged these results. Signed, Dulcy Fanny. Earleen Newport, DO [facility] Radiology Electronically Signed   By: Corrie Mckusick D.O.   On: 07/20/2016 16:57   Ir Angio Vertebral Sel Subclavian Innominate Uni L Mod Sed  Result Date: 07/20/2016 INDICATION: 71 year old male with a history of  left-sided weakness and evidence of right-sided clinical stroke. Prior MRI demonstrates diffusion restriction in a watershed distribution of the right hemisphere. EXAM: BILATERAL COMMON CAROTID AND INNOMINATE ANGIOGRAPHY AND BILATERAL VERTEBRAL ARTERY ANGIOGRAMS MEDICATIONS: None ANESTHESIA/SEDATION: Versed 1.0 mg IV; Fentanyl 25 mcg IV Moderate Sedation Time:  55 The patient was continuously monitored during the procedure by the interventional radiology nurse under my direct supervision. FLUOROSCOPY TIME:  Fluoroscopy Time: 12 minutes 30 seconds 2950 mGy). COMPLICATIONS: None TECHNIQUE: Informed written consent was obtained from the patient after a thorough discussion of the procedural risks, benefits and alternatives. All questions were addressed. Maximal Sterile Barrier Technique was utilized including caps, mask, sterile gowns, sterile gloves, sterile drape, hand hygiene and skin antiseptic. A timeout was performed prior to the initiation of the procedure. PROCEDURE: The right inguinal region was prepped and draped in the usual sterile fashion. 1% lidocaine was used for local anesthesia. Ultrasound survey of the right inguinal region was performed with images stored and sent  to PACs. A micropuncture needle was used access the right common femoral artery under ultrasound. With excellent arterial blood flow returned, and an .018 micro wire was passed through the needle, observed enter the abdominal aorta under fluoroscopy. The needle was removed, and a micropuncture sheath was placed over the wire. The inner dilator and wire were removed, and an 035 Bentson wire was advanced under fluoroscopy into the abdominal aorta. The sheath was removed and a standard 5 [place] vascular sheath was placed. The dilator was removed and the sheath was flushed. A 5 French JB1 catheter was advanced to the aortic arch region, wire was removed, and double flush was achieved. Catheter was then positioned into the origin of the innominate artery, with roadmap angiogram performed. Catheter was advanced into the subclavian artery over 035 roadrunner wire. Wire was removed and the catheter was withdrawn to the origin of the vertebral artery. Non selective injection of the subclavian artery was performed, with images acquired at the neck and intracranial. Catheter was then withdrawn, double flush was achieved, and the catheter was advanced over the wire into the common carotid artery. Angiogram of the cervical and of the intracranial segments was performed. Independent sub selection of both the external carotid artery and internal carotid artery were performed with cervical and cranial segment angiogram performed. Catheter was then withdrawn to the aortic arch, with double flush performed. Left common carotid artery was selected, and the catheter was advanced over the wire. Left common carotid artery angiogram was performed. Independent sub selection of the internal carotid artery was then performed, with angiogram of the cervical and cranial segments. Catheter was withdrawn to the aortic arch with double flush. Left subclavian artery was then selected, with non selective injection of the left vertebral artery  origin. Angiogram of the cervical and intracranial segments performed. Catheter was withdrawn from the sheath. Angiogram of the right common femoral artery sheath performed. Exoseal closure was performed. Patient tolerated the procedure well and remained hemodynamically stable throughout. No complications were encountered and no significant blood loss. FINDINGS: Innominate artery: Tortuous innominate artery with no significant atherosclerotic plaque. Subclavian artery remains patent. 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 There is patency of the ascending cervical artery from the thyrocervical trunk, with demonstrates subtle collateral filling of the distal cervical segment of the right vertebral artery as well as the intracranial segment to the vertebrobasilar junction. No significant contribution from the deep cervical  artery at this time. Right common carotid artery:  Normal course caliber and contour. Right external carotid artery: Patent with antegrade flow. Selective injection demonstrates no significant contribution to the right hemisphere. Right internal carotid artery: Selective injection of the right internal carotid artery demonstrates uniform small caliber cervical segment from the bifurcation to the skullbase. No dissection flap or significant stenosis. No irregularity of the lumen to suggest thrombus. The vertical and horizontal petrous segment are patent. Last all segment patent. Cavernous segment patent. The intracranial ICA is patent to the level of the clinoid and terminates at the ophthalmic artery which demonstrates antegrade flow. Beyond this, the artery is occluded with no flow. There is questionable development of Vasa those or Um beyond the occlusion, with no significant contribution to the M1 segment. No meniscus or tapered lumen. Right MCA: No filling of the MCA via the right ICA injection.  Right MCA fills from a left-to-right flow across patent anterior communicating artery. There appears to be a irregular stenosis of less than 50% narrowing of the distal M1 segment, corresponding to findings on prior CT scan. Right ACA: No filling of the right ACA via the right IC injection. Right-sided ACA fills via the left or right cross flow, with slightly delayed perfusion through the late arterial and capillary phase compared to the left. Left common carotid artery:  Normal course caliber and contour. Left external carotid artery: Patent with antegrade flow. Left internal carotid artery: Normal course caliber and contour of the cervical segment of the left ICA. Caliber is uniform, larger than the right. Left intracranial ICA patent. Antegrade flow of the left ophthalmic artery. Adequate flow through to the ICA terminus with filling of the M1 and A1 segments. Left MCA: M1 segment patent. Insular and opercular segments patent. Unremarkable caliber and course of the cortical segments. Typical arterial, capillary/ parenchymal, and venous phase. Left ACA: A1 segment patent. Anterior communicating artery is patent with filling of the right MCA from cross flow. Right a 2 segment filling the right ACA territory. Perfusion slightly delayed compared to the left hemisphere. Left vertebral artery: Unremarkable course caliber and contour of the left vertebral artery from the subclavian origin to the skullbase, with no significant stenosis, dissection flap, or regularity. Injection demonstrates reflux into the right distal vertebral artery at the vertebrobasilar junction, demonstrating small caliber distal right vertebral artery at the junction. Retrograde flow through patent right-sided posterior communicating artery contributes to perfusion of the right MCA territory. Bilateral posterior communicating arteries patent. Tortuous basilar artery with patency of the bilateral posterior cerebral arteries maintained. Right-sided  posterior inferior cerebellar artery not visualized. Unremarkable appearance of the right-sided anterior inferior cerebellar artery. Unremarkable appearance of the left posterior inferior cerebellar artery. Small caliber superior cerebellar arteries. IMPRESSION: Status post cervical and cerebral angiogram demonstrating relatively small caliber cervical segment of the right ICA, which is uniform from the bifurcation to the ophthalmic segment. 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 Perfusion of the right MCA from cross filling of the left intracranial ICA via patent anterior communicating artery, as well as retrograde flow through patent right posterior communicating artery. Questionable atherosclerotic changes in the distal right M1 segment, with less than 50% stenosis. Dominant left vertebral artery perfuses bilateral PCA. These results were called by telephone at the time of interpretation  on 07/20/2016 at 4:56 pm to Dr. [name] , who verbally acknowledged these results. Signed, Dulcy Fanny. Earleen Newport, DO [facility] Radiology Electronically Signed   By: Corrie Mckusick D.O.   On: 07/20/2016 16:57   Ir Angio External Carotid Sel Ext Carotid Uni R Mod Sed  Result Date: 07/20/2016 INDICATION: 71 year old male with a history of left-sided weakness and evidence of right-sided clinical stroke. Prior MRI demonstrates diffusion restriction in a watershed distribution of the right hemisphere. EXAM: BILATERAL COMMON CAROTID AND INNOMINATE ANGIOGRAPHY AND BILATERAL VERTEBRAL ARTERY ANGIOGRAMS MEDICATIONS: None ANESTHESIA/SEDATION: Versed 1.0 mg IV; Fentanyl 25 mcg IV Moderate Sedation Time:  55 The patient was continuously monitored during the  procedure by the interventional radiology nurse under my direct supervision. FLUOROSCOPY TIME:  Fluoroscopy Time: 12 minutes 30 seconds 2950 mGy). COMPLICATIONS: None TECHNIQUE: Informed written consent was obtained from the patient after a thorough discussion of the procedural risks, benefits and alternatives. All questions were addressed. Maximal Sterile Barrier Technique was utilized including caps, mask, sterile gowns, sterile gloves, sterile drape, hand hygiene and skin antiseptic. A timeout was performed prior to the initiation of the procedure. PROCEDURE: The right inguinal region was prepped and draped in the usual sterile fashion. 1% lidocaine was used for local anesthesia. Ultrasound survey of the right inguinal region was performed with images stored and sent to PACs. A micropuncture needle was used access the right common femoral artery under ultrasound. With excellent arterial blood flow returned, and an .018 micro wire was passed through the needle, observed enter the abdominal aorta under fluoroscopy. The needle was removed, and a micropuncture sheath was placed over the wire. The inner dilator and wire were removed, and an 035 Bentson wire was advanced under fluoroscopy into the abdominal aorta. The sheath was removed and a standard 5 [place] vascular sheath was placed. The dilator was removed and the sheath was flushed. A 5 French JB1 catheter was advanced to the aortic arch region, wire was removed, and double flush was achieved. Catheter was then positioned into the origin of the innominate artery, with roadmap angiogram performed. Catheter was advanced into the subclavian artery over 035 roadrunner wire. Wire was removed and the catheter was withdrawn to the origin of the vertebral artery. Non selective injection of the subclavian artery was performed, with images acquired at the neck and intracranial. Catheter was then withdrawn, double flush was achieved, and the catheter was advanced over the  wire into the common carotid artery. Angiogram of the cervical and of the intracranial segments was performed. Independent sub selection of both the external carotid artery and internal carotid artery were performed with cervical and cranial segment angiogram performed. Catheter was then withdrawn to the aortic arch, with double flush performed. Left common carotid artery was selected, and the catheter was advanced over the wire. Left common carotid artery angiogram was performed. Independent sub selection of the internal carotid artery was then performed, with angiogram of the cervical and cranial segments. Catheter was withdrawn to the aortic arch with double flush. Left subclavian artery was then selected, with non selective injection of the left vertebral artery origin. Angiogram of the cervical and intracranial segments performed. Catheter was withdrawn from the sheath. Angiogram of the right common femoral artery sheath performed. Exoseal closure was performed. Patient tolerated the procedure well and remained hemodynamically stable throughout. No complications were encountered and no significant blood loss. FINDINGS: Innominate artery: Tortuous innominate artery with no significant atherosclerotic plaque. Subclavian artery remains patent.  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 There is patency of the ascending cervical artery from the thyrocervical trunk, with demonstrates subtle collateral filling of the distal cervical segment of the right vertebral artery as well as the intracranial segment to the vertebrobasilar junction. No significant contribution from the deep cervical artery at this time. Right common carotid artery:  Normal course caliber and contour. Right external carotid artery: Patent with antegrade flow. Selective injection demonstrates no significant contribution to the right hemisphere. Right  internal carotid artery: Selective injection of the right internal carotid artery demonstrates uniform small caliber cervical segment from the bifurcation to the skullbase. No dissection flap or significant stenosis. No irregularity of the lumen to suggest thrombus. The vertical and horizontal petrous segment are patent. Last all segment patent. Cavernous segment patent. The intracranial ICA is patent to the level of the clinoid and terminates at the ophthalmic artery which demonstrates antegrade flow. Beyond this, the artery is occluded with no flow. There is questionable development of Vasa those or Um beyond the occlusion, with no significant contribution to the M1 segment. No meniscus or tapered lumen. Right MCA: No filling of the MCA via the right ICA injection. Right MCA fills from a left-to-right flow across patent anterior communicating artery. There appears to be a irregular stenosis of less than 50% narrowing of the distal M1 segment, corresponding to findings on prior CT scan. Right ACA: No filling of the right ACA via the right IC injection. Right-sided ACA fills via the left or right cross flow, with slightly delayed perfusion through the late arterial and capillary phase compared to the left. Left common carotid artery:  Normal course caliber and contour. Left external carotid artery: Patent with antegrade flow. Left internal carotid artery: Normal course caliber and contour of the cervical segment of the left ICA. Caliber is uniform, larger than the right. Left intracranial ICA patent. Antegrade flow of the left ophthalmic artery. Adequate flow through to the ICA terminus with filling of the M1 and A1 segments. Left MCA: M1 segment patent. Insular and opercular segments patent. Unremarkable caliber and course of the cortical segments. Typical arterial, capillary/ parenchymal, and venous phase. Left ACA: A1 segment patent. Anterior communicating artery is patent with filling of the right MCA from cross  flow. Right a 2 segment filling the right ACA territory. Perfusion slightly delayed compared to the left hemisphere. Left vertebral artery: Unremarkable course caliber and contour of the left vertebral artery from the subclavian origin to the skullbase, with no significant stenosis, dissection flap, or regularity. Injection demonstrates reflux into the right distal vertebral artery at the vertebrobasilar junction, demonstrating small caliber distal right vertebral artery at the junction. Retrograde flow through patent right-sided posterior communicating artery contributes to perfusion of the right MCA territory. Bilateral posterior communicating arteries patent. Tortuous basilar artery with patency of the bilateral posterior cerebral arteries maintained. Right-sided posterior inferior cerebellar artery not visualized. Unremarkable appearance of the right-sided anterior inferior cerebellar artery. Unremarkable appearance of the left posterior inferior cerebellar artery. Small caliber superior cerebellar arteries. IMPRESSION: Status post cervical and cerebral angiogram demonstrating relatively small caliber cervical segment of the right ICA, which is uniform from the bifurcation to the ophthalmic segment. 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 Perfusion of the right MCA from cross filling of the left intracranial ICA via patent anterior communicating artery, as well as retrograde flow through patent right posterior communicating artery. Questionable atherosclerotic changes in the distal right M1 segment, with less than 50% stenosis. Dominant left vertebral artery perfuses bilateral PCA. These results were called by telephone at the time of interpretation on 07/20/2016 at 4:56 pm to Dr.  [name] , who verbally acknowledged these results. Signed, Dulcy Fanny. Earleen Newport, DO [facility] Radiology Electronically Signed   By: Corrie Mckusick D.O.   On: 07/20/2016 16:57    Micro Results     Recent Results (from the past 240 hour(s))  Urine culture     Status: None   Collection Time: 07/15/16  8:14 AM  Result Value Ref Range Status   Specimen Description URINE, CLEAN CATCH  Final   Special Requests NONE  Final   Culture NO GROWTH  Final   Report Status 07/16/2016 FINAL  Final  Culture, blood (routine x 2) Call MD if unable to obtain prior to antibiotics being given     Status: None   Collection Time: 07/15/16  8:27 AM  Result Value Ref Range Status   Specimen Description BLOOD RIGHT ARM  Final   Special Requests IN PEDIATRIC BOTTLE Blood Culture adequate volume  Final   Culture NO GROWTH 5 DAYS  Final   Report Status 07/20/2016 FINAL  Final  Culture, blood (routine x 2) Call MD if unable to obtain prior to antibiotics being given     Status: None   Collection Time: 07/15/16  8:27 AM  Result Value Ref Range Status   Specimen Description BLOOD RIGHT HAND  Final   Special Requests IN PEDIATRIC BOTTLE Blood Culture adequate volume  Final   Culture NO GROWTH 5 DAYS  Final   Report Status 07/20/2016 FINAL  Final       Today   Subjective    Quentin Shorey today has improved strength in the left lower ext. Pt would like to go home today.    Objective   Blood pressure 127/64, pulse 72, temperature 99 F (37.2 C), temperature source Oral, resp. rate 18, height 5\' 9"  (1.753 m), weight 111.4 kg (245 lb 9.5 oz), SpO2 96 %.   Intake/Output Summary (Last 24 hours) at 07/21/16 1529 Last data filed at 07/21/16 1430  Gross per 24 hour  Intake                0 ml  Output              800 ml  Net             -800 ml    Exam Awake Alert, Oriented x 3, No new F.N deficits, Normal affect [place].AT,PERRAL Supple Neck,No JVD, No  cervical lymphadenopathy appriciated.  Symmetrical Chest wall movement, Good air movement bilaterally, CTAB RRR,No Gallops,Rubs or new Murmurs, No Parasternal Heave +ve B.Sounds, Abd Soft, Non tender, No organomegaly appriciated, No rebound -guarding or rigidity. No Cyanosis, Clubbing or edema, No new Rash or bruise  Motor Strength - The patient's strength was 4+/5 LUE with decreased dexterity and 2/5 LLE proximal but 3/5 LLE knee extension, 5/5 RUE and RLE, and pronator drift was present on the left.  Bulk was normal and fasciculations were absent.   Motor Tone - Muscle tone was assessed at the neck and appendages and was increased on the LLE.  Reflexes -  The patient's reflexes were 1+ in all extremities and he had no pathological reflexes.  Sensory - Light touch, temperature/pinprick and were decreased on the left UE and LE, about 80% of the right.    Coordination - The patient had left FTN mild dysmetria.  Tremor was absent.   Data Review   CBC w Diff:  Lab Results  Component Value Date   WBC 5.2 07/21/2016   HGB 10.8 (L) 07/21/2016   HCT 34.5 (L) 07/21/2016   PLT 241 07/21/2016   LYMPHOPCT 24 07/21/2016   MONOPCT 6 07/21/2016   EOSPCT 1 07/21/2016   BASOPCT 0 07/21/2016    CMP:  Lab Results  Component Value Date   NA 137 07/21/2016   K 4.1 07/21/2016   CL 102 07/21/2016   CO2 27 07/21/2016   BUN 13 07/21/2016   CREATININE 0.87 07/21/2016   CREATININE 0.99 10/15/2013   PROT 6.3 (L) 07/21/2016   ALBUMIN 3.1 (L) 07/21/2016   BILITOT 0.5 07/21/2016   ALKPHOS 64 07/21/2016   AST 36 07/21/2016   ALT 46 07/21/2016  .   Total Time in preparing paper work, data evaluation and todays exam - 1 minutes  Jani Gravel M.D on 07/21/2016 at 3:29 PM  [facility]   Office  [phone]

## 2016-07-22 ENCOUNTER — Telehealth: Payer: Self-pay

## 2016-07-22 NOTE — Telephone Encounter (Addendum)
Attempted to contact patient to complete TCM call and schedule hospital follow up. Voicemail not available. Will continue to attempt to reach patient.

## 2016-07-23 NOTE — Telephone Encounter (Signed)
Spoke with patients brother, Barbaraann Rondo. Phone was disconnected several times. Spoke with patient to review medications and schedule f/u appointment, phone disconnected again before complete. LM on VM to return call to schedule TCM/Hosp f/u.    Transition Care Management Follow-up Telephone Call   Date discharged? 07/21/2016   How have you been since you were released from the hospital? "He's doing pretty good"   Do you understand why you were in the hospital? Yes, "he had several strokes"   Do you understand the discharge instructions? yes   Where were you discharged to? Home, brother lives with patient   Items Reviewed:  Medications reviewed: No. Phone disconnected prior to completion.   Allergies reviewed: No  Dietary changes reviewed: No.  Referrals reviewed: yes. Barbaraann Rondo states patient has f/u with Dr. Maudie Mercury on Monday, 07/26/2016   Functional Questionnaire:   Activities of Daily Living (ADLs):   He states they are independent in the following: None. Assistance is needed with ADL's.  States they require assistance with the following: ambulation, bathing and hygiene, feeding, continence, grooming, toileting and dressing   Any transportation issues/concerns?: no   Any patient concerns? no   Confirmed importance and date/time of follow-up visits scheduled yes  Provider Appointment booked with  Confirmed with patient if condition begins to worsen call PCP or go to the ER.  Patient was given the office number and encouraged to call back with question or concerns.  : No. Phone disconnected prior to completion.

## 2016-07-25 ENCOUNTER — Other Ambulatory Visit: Payer: Self-pay | Admitting: Physician Assistant

## 2016-07-26 NOTE — Telephone Encounter (Signed)
Thank you for making me aware. I knew we had spoke previously about him needing a provider closer since my move to [place]. I will remove myself as PCP.

## 2016-07-26 NOTE — Telephone Encounter (Signed)
Spoke with patients brother, Barbaraann Rondo. Barbaraann Rondo states patient is currently being evaluated by a physician (Internal Medicine) at [facility]. They plan to continue patients care with this provider and will be signing a records release while at their office today.

## 2016-07-29 ENCOUNTER — Other Ambulatory Visit: Payer: Self-pay | Admitting: Physician Assistant

## 2016-07-29 DIAGNOSIS — J302 Other seasonal allergic rhinitis: Secondary | ICD-10-CM

## 2016-07-29 DIAGNOSIS — I10 Essential (primary) hypertension: Secondary | ICD-10-CM

## 2016-08-08 ENCOUNTER — Other Ambulatory Visit: Payer: Self-pay | Admitting: Physician Assistant

## 2016-08-08 DIAGNOSIS — I10 Essential (primary) hypertension: Secondary | ICD-10-CM

## 2016-09-08 ENCOUNTER — Other Ambulatory Visit: Payer: Self-pay | Admitting: Physician Assistant

## 2016-09-08 DIAGNOSIS — J302 Other seasonal allergic rhinitis: Secondary | ICD-10-CM

## 2016-09-21 ENCOUNTER — Ambulatory Visit (INDEPENDENT_AMBULATORY_CARE_PROVIDER_SITE_OTHER): Payer: Medicare Other | Admitting: Neurology

## 2016-09-21 ENCOUNTER — Encounter: Payer: Self-pay | Admitting: Neurology

## 2016-09-21 VITALS — BP 169/86 | HR 59 | Ht 66.5 in | Wt 242.4 lb

## 2016-09-21 DIAGNOSIS — I6521 Occlusion and stenosis of right carotid artery: Secondary | ICD-10-CM | POA: Diagnosis not present

## 2016-09-21 DIAGNOSIS — R269 Unspecified abnormalities of gait and mobility: Secondary | ICD-10-CM | POA: Diagnosis not present

## 2016-09-21 DIAGNOSIS — W19XXXA Unspecified fall, initial encounter: Secondary | ICD-10-CM

## 2016-09-21 DIAGNOSIS — R531 Weakness: Secondary | ICD-10-CM

## 2016-09-21 MED ORDER — ATORVASTATIN CALCIUM 20 MG PO TABS: 20.0000 mg | ORAL_TABLET | Freq: Every day | ORAL | 6 refills | Status: DC

## 2016-09-21 NOTE — Progress Notes (Signed)
[facility]    Provider:  Dr Jaynee Eagles Referring Provider: [name], MD Primary Care Physician:  [facility]  CC:  Stroke follow up  HPI:  Ronald [name] is a 71 y.o. male here as a referral from Dr. Erlinda Hong for stroke follow up. Past medical history dementia, depression, hyperlipidemia, hypertension, schizophrenia symptoms with depression. Per a review of records: Patient was seen in the emergency room on 07/16/2016 with cough and fever and weakness on his left side with a fall. Weakness started about 3 weeks previous. The morning he was seen he fell asleep in his chair and fell out of his chair to the ground landing on his left side and she has had pain in his left leg since then and difficulty moving the leg due to pain. Family said patient complained of left arm and left leg pain after falling and not previously. Chest x-ray revealed lower lobe pneumonia and he was admitted. MRI of the brain showed no evidence of acute ischemia but did show some chronic right hemisphere ischemic change.  Patient is here with brother who also provides information. Patient says he fell on the ground and hit his left arm and feels better. He feels improved. No left arm numbness. He feels his left arm is better and his left side is better.   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 Seroquel angiography showed right ICA occlusion or clinoid.  Review of Systems: Patient complains of symptoms per HPI as well as the following symptoms: memory loss,  confusion, numbness, weakness, dizziness, tremor, decreased energy.. Pertinent negatives and positives per HPI. All others negative.   Social History   Social History  . Marital status: Single    Spouse name: N/A  . Number of children: N/A  . Years of education: N/A   Occupational History  . Not on file.   Social History Main Topics  . Smoking status: Never Smoker  . Smokeless tobacco: Never Used  . Alcohol use No  . Drug use: No  . Sexual activity: No   Other Topics Concern  . Not on file   Social History Narrative  . No narrative on file    Family History  Problem Relation Age of Onset  . Hypertension Father   . Hyperlipidemia Father   . Congestive Heart Failure Father 74       Deceased-1995  . Diabetes Mother   . Kidney failure Mother 61       Deceased-2005  . Hypertension Mother   . Congestive Heart Failure Maternal Grandmother   . Heart failure Paternal Grandmother   . Heart attack Paternal Grandmother     Past Medical History:  Diagnosis Date  . Cancer ([place])   . Chicken pox   . Dementia   . Depressed   . Hyperlipemia   . Hypertension   . Measles   . Mumps   . 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 IR ANGIO EXTERNAL CAROTID SEL EXT CAROTID UNI R MOD SED  07/20/2016  .  IR ANGIO INTRA EXTRACRAN SEL INTERNAL CAROTID BILAT MOD SED  07/20/2016  . IR ANGIO VERTEBRAL SEL SUBCLAVIAN INNOMINATE UNI L MOD SED  07/20/2016  . IR ANGIOGRAM EXTREMITY RIGHT  07/20/2016  . Unremarkable      Current Outpatient Prescriptions  Medication Sig Dispense Refill  . ARIPiprazole (ABILIFY) 10 MG tablet TAKE 1 TABLET BY MOUTH EVERY DAY (Patient not taking: Reported on 07/15/2016) 90 tablet 1  . aspirin EC 325 MG EC tablet Take 1 tablet (325 mg total) by mouth daily. 30 tablet 0  . atorvastatin (LIPITOR) 80 MG tablet Take 1 tablet (80 mg total) by mouth daily at 6 PM. 30 tablet 0  . Calcium-Vitamin D  600-200 MG-UNIT per tablet Take 1 tablet by mouth daily.    . cloNIDine (CATAPRES) 0.1 MG tablet Take 1 tablet (0.1 mg total) by mouth 3 (three) times daily. 60 tablet 11  . clopidogrel (PLAVIX) 75 MG tablet Take 1 tablet (75 mg total) by mouth daily. 30 tablet 0  . finasteride (PROSCAR) 5 MG tablet Take 1 tablet (5 mg total) by mouth daily. 30 tablet 0  . fluticasone (FLONASE) 50 MCG/ACT nasal spray Place 2 sprays into both nostrils daily. 16 g 6  . lisinopril (PRINIVIL,ZESTRIL) 30 MG tablet TAKE 1 TABLET (30 MG TOTAL) BY MOUTH DAILY. 30 tablet 0  . metoprolol (LOPRESSOR) 50 MG tablet TAKE 1 TABLET BY MOUTH TWICE A DAY 60 tablet 0  . oxyCODONE (OXY IR/ROXICODONE) 5 MG immediate release tablet Take 1 tablet (5 mg total) by mouth every 4 (four) hours as needed for moderate pain. 10 tablet 0  . potassium chloride SA (KLOR-CON M20) 20 MEQ tablet TAKE 1 TABLET (20 MEQ TOTAL) BY MOUTH DAILY. (Patient not taking: Reported on 07/15/2016) 90 tablet 1  . ranitidine (ZANTAC) 150 MG capsule TAKE ONE CAPSULE BY MOUTH TWICE A DAY (Patient not taking: Reported on 07/15/2016) 180 capsule 1   No current facility-administered medications for this visit.     Allergies as of 09/21/2016 - Review Complete 07/19/2016  Allergen Reaction Noted  . Dust mite extract Other (See Comments) 10/21/2015  . Pollen extract Other (See Comments) 10/21/2015  . Rye grass flower pollen extract [gramineae pollens] Other (See Comments) 10/21/2015    Vitals: There were no vitals taken for this visit. Last Weight:  Wt Readings from Last 1 Encounters:  07/21/16 245 lb 9.5 oz (111.4 kg)   Last Height:   Ht Readings from Last 1 Encounters:  07/15/16 5\' 9"  (1.753 m)   Physical exam: Exam: Gen: NAD, conversant                CV: RRR, no MRG. No Carotid Bruits. No peripheral edema, warm, nontender Eyes: Conjunctivae clear without exudates or hemorrhage  Neuro: Detailed Neurologic Exam  Speech:    Speech is normal, no aphasia  or dysarthria, bradyphonic  Cognition:    The patient is oriented to person, month, year, knows the president    recent and remote memory impaired;     language fluent;     Impaired attention, concentration,     fund of knowledge impaired  Cranial Nerves:    The pupils are equal, round, and reactive to light. Attempted fundoscopic exam could not visualize. . Visual fields are full to finger confrontation. Extraocular movements are intact with saccades. Trigeminal sensation is intact and the muscles of mastication are normal. The face is symmetric. The palate elevates in the midline. Hearing intact. Voice is normal. Shoulder shrug  is normal. The tongue has normal motion without fasciculations.   Coordination:    Some mild dysmetria and decreased fine motor with the left hand  Gait:    Shuffling and stooped  Motor Observation:    Postural mild tremor and mild chin tremor. Left hand resting tremor. Tone:    Normal muscle tone.    Posture:    Posture is stooped    Strength: Left proximal weakness but overall improved with symmetric strength         Sensation: intact to LT     Reflex Exam:  DTR's:    Absent AJs. Deep tendon reflexes in the upper and lower extremities are 3+ on left .   Toes:    The toes are equivocal bilaterally.   Clonus:    Clonus is absent.       Assessment/Plan:   71 y.o. male here as a referral from Dr. Erlinda Hong for stroke follow up. Past medical history dementia, depression, hyperlipidemia, hypertension, schizophrenia symptoms with depression.  MRI of the brain showed nothing acute however a right subcortical infarct due to right ICA occlusion versus high-grade stenosis was suspected. MRI showed right cerebral white matter [place] is an abnormal appearance of the right ICA. LDL was 86, hemoglobin A1c 5.4, he was discharged into antiplatelet therapy for 3 months and then Plavix alone. No further intervention of the right ICA needed. Blood pressure goal is slightly  higher than 130 - 150 due to right ICA occlusion. Cholesterol goal is less than 70 and he was started on Lipitor. I do not think he needs Lipitor 80 mg but will ask that he follow-up with primary care about this and with ask him to take half. Other stroke risk factors include advanced age and obesity. He was treated in the hospital for community-acquired pneumonia as well as rhabdomyolysis due to fall and being found on the ground.   Sarina Ill, MD  [facility] [address] [place], [place] [address]  Phone [phone] Fax [phone]

## 2016-09-21 NOTE — Patient Instructions (Addendum)
Remember to drink plenty of fluid, eat healthy meals and do not skip any meals. Try to eat protein with a every meal and eat a healthy snack such as fruit or nuts in between meals. Try to keep a regular sleep-wake schedule and try to exercise daily, particularly in the form of walking, 20-30 minutes a day, if you can.   As far as your medications are concerned, I would like to suggest:  Continue Aspirin and Plavix for 4 more weeks then stop Aspirin Will decrease Atorvastatin to 20mg  at night  I would like to see you back in 6 months, sooner if we need to. Please call us with any interim questions, concerns, problems, updates or refill requests. .   Our phone number is [phone]. We also have an after hours call service for urgent matters and there is a physician on-call for urgent questions. For any emergencies you know to call 911 or go to the nearest emergency room

## 2016-09-30 ENCOUNTER — Ambulatory Visit: Payer: Medicare Other | Attending: Neurology | Admitting: Physical Therapy

## 2016-09-30 DIAGNOSIS — M6281 Muscle weakness (generalized): Secondary | ICD-10-CM | POA: Insufficient documentation

## 2016-09-30 DIAGNOSIS — R262 Difficulty in walking, not elsewhere classified: Secondary | ICD-10-CM | POA: Insufficient documentation

## 2016-09-30 DIAGNOSIS — I69354 Hemiplegia and hemiparesis following cerebral infarction affecting left non-dominant side: Secondary | ICD-10-CM | POA: Insufficient documentation

## 2016-09-30 DIAGNOSIS — R2689 Other abnormalities of gait and mobility: Secondary | ICD-10-CM | POA: Insufficient documentation

## 2016-10-05 ENCOUNTER — Ambulatory Visit: Payer: Medicare Other | Admitting: Physical Therapy

## 2016-10-05 DIAGNOSIS — I69354 Hemiplegia and hemiparesis following cerebral infarction affecting left non-dominant side: Secondary | ICD-10-CM

## 2016-10-05 DIAGNOSIS — M6281 Muscle weakness (generalized): Secondary | ICD-10-CM

## 2016-10-05 DIAGNOSIS — R2689 Other abnormalities of gait and mobility: Secondary | ICD-10-CM

## 2016-10-05 DIAGNOSIS — R262 Difficulty in walking, not elsewhere classified: Secondary | ICD-10-CM | POA: Diagnosis present

## 2016-10-06 NOTE — Therapy (Signed)
[facility] [address]  [place], [place], [address] Phone: [phone]   Fax:  [phone]  Physical Therapy Evaluation  Patient Details  Name: Ronald [name] MRN: [id number] Date of Birth: September 10, 1945 Referring Provider: Sarina Ill, MD  Encounter Date: 10/05/2016      PT End of Session - 10/05/16 0930    Visit Number 1   Number of Visits 16   Date for PT Re-Evaluation 12/03/16   Authorization Type [employer] Medicare   PT Start Time 0921   PT Stop Time 1018   PT Time Calculation (min) 57 min   Activity Tolerance Patient tolerated treatment well   Behavior During Therapy [facility] for tasks assessed/performed      Past Medical History:  Diagnosis Date  . Cancer [facility])    Prostate  . Chicken pox   . Dementia   . Depression   . Heart disease   . Hyperlipemia   . Hypertension   . Measles   . Mumps   . 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 Stroke [facility])     Past Surgical History:  Procedure Laterality Date  . IR ANGIO EXTERNAL CAROTID SEL EXT CAROTID UNI R MOD SED  07/20/2016  . IR ANGIO INTRA EXTRACRAN SEL INTERNAL CAROTID BILAT MOD SED  07/20/2016  . IR ANGIO VERTEBRAL SEL SUBCLAVIAN INNOMINATE UNI L MOD SED  07/20/2016  . IR ANGIOGRAM EXTREMITY RIGHT  07/20/2016    There were no vitals filed for this visit.       Subjective Assessment - 10/05/16 0935    Subjective Pt reports h/o CVA x 3 starting in 08/2015 with most recent ~6 months ago, with residual L hemiparesis. Reports he suffered falls with the first & last stroke, injuring L thigh & knee but pain now resolved. Received HH PT & OT via [facility] finishing in June 2018.   Pertinent History CVA x 3 (08/2015 - 06/2016) - L hemiparesis   Limitations Walking   How long can you walk comfortably? ~1 block   Patient Stated Goals "walk better"   Currently in Pain? 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 1   Has the patient had a decrease in activity level because of a fear of falling?  No   Is the patient reluctant to leave their home because of a fear of falling?  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   Overall Cognitive Status  Within Functional Limits for tasks assessed     Observation/Other Assessments   Focus on Therapeutic Outcomes (FOTO)  Neuro - 49% (51% limitation); predicted 60% (40% limitation)     ROM / Strength   AROM / PROM / Strength Strength     Strength   Overall Strength Comments tested in sitting   Strength Assessment Site Hip;Knee;Ankle   Right/Left Hip Right;Left   Right Hip Flexion 5/5   Right Hip Extension 3+/5   Right Hip ABduction 4+/5   Right Hip ADduction 4/5   Left Hip Flexion 3-/5   Left Hip Extension 3-/5   Left Hip ABduction 4-/5   Left Hip ADduction 4-/5   Right/Left Knee Right;Left   Right Knee Flexion 4/5   Right Knee Extension 4/5   Left Knee Flexion 4-/5   Left Knee Extension 4-/5   Right/Left Ankle Right;Left   Right Ankle Dorsiflexion 5/5   Left Ankle Dorsiflexion 4+/5     Ambulation/Gait   Ambulation/Gait Yes   Ambulation/Gait Assistance 5: Supervision   Ambulation/Gait  Assistance Details 75   Assistive device None   Gait Pattern Decreased dorsiflexion - left;Decreased hip/knee flexion - left;Left foot flat;Poor foot clearance - left;Decreased step length - left;Decreased stride length;Step-through pattern   Ambulation Surface Level;Indoor   Gait velocity 1.42 ft/sec     Standardized Balance Assessment   Standardized Balance Assessment Berg Balance Test;Timed Up and Go Test;10 meter walk test;Five Times Sit to Stand   Five times sit to stand comments  32.06"   10 Meter Walk 23.06"     Berg Balance Test   Sit to Stand Able to stand  independently using hands   Standing Unsupported Able to stand safely 2 minutes   Sitting with Back Unsupported but Feet Supported on Floor or Stool Able to sit safely and securely 2 minutes   Stand to Sit Uses backs of legs against chair to control descent   Transfers Able to transfer safely, minor use of hands   Standing Unsupported with Eyes Closed Able to stand 10 seconds with supervision   Standing Ubsupported with Feet Together Able to place feet together independently and stand for 1 minute with supervision   From Standing, Reach Forward with Outstretched Arm Can reach forward >5 cm safely (2")   From Standing Position, Pick up Object from Floor Unable to pick up shoe, but reaches 2-5 cm (1-2") from shoe and balances independently   From Standing Position, Turn to Look Behind Over each Shoulder Needs supervision when turning   Turn 360 Degrees Able to turn 360 degrees safely but slowly   Standing Unsupported, Alternately Place Feet on Step/Stool Needs assistance to keep from falling or unable to try   Standing Unsupported, One Foot in Front Able to take small step independently and hold 30 seconds   Standing on One Leg Tries to lift leg/unable to hold 3 seconds but remains standing independently   Total Score 33   Berg comment: <36 = High risk for falls (close to 100%)      Timed Up and Go Test   Normal TUG (seconds)  24.44   Manual TUG (seconds) 28.34   Cognitive TUG (seconds) 36.41            Objective measurements completed on examination: See above findings.                    PT Short Term Goals - 10/05/16 1018      PT SHORT  TERM GOAL #1   Title Independent with initial HEP by 10/29/16   Status New     PT SHORT TERM GOAL #2   Title Pt to improve gait speed to >/= 2.0 ft/sec for improved efficiency of gait by 10/29/16   Status New     PT SHORT TERM GOAL #3   Title Pt to improve Berg Balance to >/= 38/56 in order to improve functional balance by 10/29/16   Status New           PT Long Term Goals - 10/05/16 1018      PT LONG TERM GOAL #1   Title Independent with ongoing HEP by 12/03/16   Status New     PT LONG TERM GOAL #2   Title Pt to improve gait speed to >/= 2.62 ft/sec for improved efficiency of gait and functional mobility with community ambulation by 12/03/16   Status New     PT LONG TERM GOAL #3   Title Pt to improve TUG to </= 18 sec indicating reduced fall risk by 12/03/16   Status New     PT LONG TERM GOAL #4   Title Pt to improve Berg Balance to >/= 45/56 in order to indicate improved independence with functional mobility by 12/03/16   Status New                Plan - 10/05/16 1018    Clinical Impression Statement Devlyn is a 71 y/o male who presents to OP PT for a moderately complex eval following ischemic CVA on 07/14/2016 from occlusion of the R ICA, at which time pt also suffered a fall causing trauma to the L arm and leg but no fractures. Pt has had 3 CVA's within <1 yr and demonstrates L hemiparesis affecting his mobility and gait. Pt ambulates w/o AD but demonstrates decreased L hip and knee flexion, decreased foot clearance, decreased stride length and poor gait velocity at 1.42 ft/sec. Balance testing with Berg, TUG and 5x sit to stand all indicate a high risk for falls. Strength testing reveals mild to moderate L LE weakness, most prominent at  hip. Pt demonstrates good potential to benefit from skilled PT for LE strengthening, balance and coordination training, gait training including safe use of least restrictive AD if indicated, and dynamic gait training to improve safety with gait and mobility and reduce risk for falls.   History and Personal Factors relevant to plan of care: HTN, dementia, depression, bronchitis, h/o rhabdomyolysis   Clinical Presentation Evolving   Clinical Presentation due to: CVA x 3 within <1 yr   Clinical Decision Making Moderate   Rehab Potential Good   PT Frequency 2x / week  1-2x/wk as pt can afford copay   PT Duration 8 weeks   PT Treatment/Interventions Patient/family education;ADLs/Self Care Home Management;Neuromuscular re-education;Balance training;Therapeutic exercise;Therapeutic activities;Functional mobility training;Gait training;Stair training;Manual techniques;Electrical Stimulation   PT Next Visit Plan Initial HEP (possibly [place]); LE strengthening; balance training; gait training   Consulted and Agree with Plan of Care Patient      Patient will benefit from skilled therapeutic intervention in order to improve the following deficits and impairments:  Decreased strength, Decreased range of motion, Impaired tone, Decreased mobility, Decreased balance, Difficulty walking, Abnormal gait, Decreased activity tolerance, Decreased endurance, Decreased safety awareness, Impaired UE functional use  Visit Diagnosis: Hemiplegia and hemiparesis following cerebral infarction affecting left non-dominant side (HCC)  Difficulty in walking, not elsewhere classified  Other abnormalities of gait and mobility  Muscle  weakness (generalized)      G-Codes - 10/26/2016 1018    Functional Assessment Tool Used (Outpatient Only) Neuro FOTO = 49% (51% limitation) & Berg = 33/56 (41% limitation)   Functional Limitation Mobility: Walking and moving around   Mobility: Walking and Moving Around Current Status (H0623)  At least 40 percent but less than 60 percent impaired, limited or restricted   Mobility: Walking and Moving Around Goal Status [phone]) At least 20 percent but less than 40 percent impaired, limited or restricted       Problem List Patient Active Problem List   Diagnosis Date Noted  . 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 Cerebral thrombosis with cerebral infarction 07/20/2016  . Non-traumatic rhabdomyolysis   . Bronchitis 07/15/2016  . Dementia 07/15/2016  . CAP (community acquired pneumonia) 07/15/2016  . Acute left hemiparesis ([place]) 07/15/2016  . Abnormal urinalysis 07/15/2016  . Effusion of left knee 07/15/2016  . Elevated AST (SGOT) 07/15/2016  . Elevated troponin 07/15/2016  . Lobar pneumonia ([place])   . Prostate cancer ([place]) 10/21/2015  . Other seasonal allergic rhinitis 10/21/2015  . Essential hypertension 01/16/2014  . Bipolar 1 disorder ([place]) 08/02/2013  . GERD (gastroesophageal reflux disease) 07/04/2013  . Elevated PSA, less than 10 ng/ml 07/04/2013  . BPH with elevated PSA 02/28/2013  . Encounter for Medicare annual wellness exam 02/28/2013  . Hyperlipidemia 02/28/2013    Percival Spanish, PT, MPT 10/06/2016, 8:43 AM  [facility] [address]  [place], [place], [address] Phone: [phone]   Fax:  [phone]  Name: Ryden Wainer MRN: [id number] Date of Birth: January 06, 1946

## 2016-10-14 ENCOUNTER — Ambulatory Visit: Payer: Medicare Other | Admitting: Physical Therapy

## 2016-10-14 DIAGNOSIS — I69354 Hemiplegia and hemiparesis following cerebral infarction affecting left non-dominant side: Secondary | ICD-10-CM

## 2016-10-14 DIAGNOSIS — R262 Difficulty in walking, not elsewhere classified: Secondary | ICD-10-CM

## 2016-10-14 DIAGNOSIS — M6281 Muscle weakness (generalized): Secondary | ICD-10-CM

## 2016-10-14 DIAGNOSIS — R2689 Other abnormalities of gait and mobility: Secondary | ICD-10-CM

## 2016-10-14 NOTE — Therapy (Signed)
[facility] [address]  [place], [place], [address] Phone: [phone]   Fax:  [phone]  Physical Therapy Treatment  Patient Details  Name: Ronald [name] MRN: [id number] Date of Birth: 04-15-1945 Referring Provider: Sarina Ill, MD  Encounter Date: 10/14/2016      PT End of Session - 10/14/16 0928    Visit Number 2   Number of Visits 16   Date for PT Re-Evaluation 12/03/16   Authorization Type UHC Medicare   PT Start Time 0928   PT Stop Time 1017   PT Time Calculation (min) 49 min   Activity Tolerance Patient tolerated treatment well   Behavior During Therapy [facility] for tasks assessed/performed      Past Medical History:  Diagnosis Date  . Cancer [facility])    Prostate  . Chicken pox   . Dementia   . Depression   . Heart disease   . Hyperlipemia   . Hypertension   . Measles   . Mumps   . 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 Stroke [facility])     Past Surgical History:  Procedure Laterality Date  . IR ANGIO EXTERNAL CAROTID SEL EXT CAROTID UNI R MOD SED  07/20/2016  . IR ANGIO INTRA EXTRACRAN SEL INTERNAL CAROTID BILAT MOD SED  07/20/2016  . IR ANGIO VERTEBRAL SEL SUBCLAVIAN INNOMINATE UNI L MOD SED  07/20/2016  . IR ANGIOGRAM EXTREMITY RIGHT  07/20/2016    There were no vitals filed for this visit.      Subjective Assessment - 10/14/16 0932    Subjective Pt with new concerns of complaints.   Pertinent History CVA x 3 (08/2015 - 06/2016) - L hemiparesis   Patient Stated Goals "walk better"   Currently in Pain? No/denies                         [facility] Adult PT Treatment/Exercise - 10/14/16 4097      Exercises   Exercises Knee/Hip     Knee/Hip Exercises: Aerobic   Nustep lvl 4 x 6'             Balance Exercises - 10/14/16 0928      OTAGO PROGRAM   Head Movements Standing;5 reps   Neck Movements Standing;5 reps   Back Extension Standing;5 reps;Other  reps (comment)  Pt back facing counter   Trunk Movements Standing;5 reps   Ankle Movements Sitting;10 reps   Knee Extensor 10 reps   Knee Flexor 10 reps   Hip ABductor 10 reps   Ankle Plantorflexors 20 reps, support   Ankle Dorsiflexors 20 reps, support   Knee Bends 10 reps, support   Backwards Walking Support   Walking and Turning Around No assistive device   Sideways Walking No assistive device   Tandem Stance 10 seconds, support   Tandem Walk Support   Overall OTAGO Comments unable to complete full program due to lack of time           PT Education - 10/14/16 1019    Education provided Yes   Education Details [place] fall prevention program   Person(s) Educated Patient   Methods Explanation;Demonstration;Verbal cues;Tactile cues;Handout   Comprehension Verbalized understanding;Returned demonstration;Verbal cues required;Tactile cues required;Need further instruction          PT Short Term Goals - 10/14/16 0933      PT SHORT TERM GOAL #1   Title Independent with initial HEP  by 10/29/16   Status On-going     PT SHORT TERM GOAL #2   Title Pt to improve gait speed to >/= 2.0 ft/sec for improved efficiency of gait by 10/29/16   Status On-going     PT SHORT TERM GOAL #3   Title Pt to improve Berg Balance to >/= 38/56 in order to improve functional balance by 10/29/16   Status On-going           PT Long Term Goals - 10/14/16 0933      PT LONG TERM GOAL #1   Title Independent with ongoing HEP by 12/03/16   Status On-going     PT LONG TERM GOAL #2   Title Pt to improve gait speed to >/= 2.62 ft/sec for improved efficiency of gait and functional mobility with community ambulation by 12/03/16   Status On-going     PT LONG TERM GOAL #3   Title Pt to improve TUG to </= 18 sec indicating reduced fall risk by 12/03/16   Status On-going     PT LONG TERM GOAL #4   Title Pt to improve Berg Balance to >/= 45/56 in order to indicate improved independence with functional mobility  by 12/03/16   Status On-going               Plan - 10/14/16 0934    Clinical Impression Statement Pt noting improved ability to lift his L hip  with walking. Introduced [place] fall prevention program as initial HEP, but unable to complete training due to time constraints. Pt able to demonstrate all instructed exercises appropriately with recommended home activities highlighted in yellow.   Rehab Potential Good   Clinical Impairments Affecting Rehab Potential HTN, dementia, depression, bronchitis, h/o rhabdomyolysis   PT Frequency --  1-2x/wk as pt can afford copay   PT Treatment/Interventions Patient/family education;ADLs/Self Care Home Management;Neuromuscular re-education;Balance training;Therapeutic exercise;Therapeutic activities;Functional mobility training;Gait training;Stair training;Manual techniques;Electrical Stimulation   PT Next Visit Plan Complete training in [place] fall prevention program; LE strengthening; balance training; gait training   Consulted and Agree with Plan of Care Patient      Patient will benefit from skilled therapeutic intervention in order to improve the following deficits and impairments:  Decreased strength, Decreased range of motion, Impaired tone, Decreased mobility, Decreased balance, Difficulty walking, Abnormal gait, Decreased activity tolerance, Decreased endurance, Decreased safety awareness, Impaired UE functional use  Visit Diagnosis: Hemiplegia and hemiparesis following cerebral infarction affecting left non-dominant side (HCC)  Difficulty in walking, not elsewhere classified  Other abnormalities of gait and mobility  Muscle weakness (generalized)     Problem List Patient Active Problem List   Diagnosis Date Noted  . 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 Cerebral thrombosis with cerebral infarction 07/20/2016  . Non-traumatic rhabdomyolysis   . Bronchitis 07/15/2016  . Dementia 07/15/2016   . CAP (community acquired pneumonia) 07/15/2016  . Acute left hemiparesis ([place]) 07/15/2016  . Abnormal urinalysis 07/15/2016  . Effusion of left knee 07/15/2016  . Elevated AST (SGOT) 07/15/2016  . Elevated troponin 07/15/2016  . Lobar pneumonia ([place])   . Prostate cancer ([place]) 10/21/2015  . Other seasonal allergic rhinitis 10/21/2015  . Essential hypertension 01/16/2014  . Bipolar 1 disorder ([place]) 08/02/2013  . GERD (gastroesophageal reflux disease) 07/04/2013  . Elevated PSA, less than 10 ng/ml 07/04/2013  . BPH with elevated PSA 02/28/2013  . Encounter for Medicare annual wellness exam 02/28/2013  . Hyperlipidemia 02/28/2013  Percival Spanish, PT, MPT 10/14/2016, 10:21 AM  [facility] [address]  [place], [place], [address] Phone: [phone]   Fax:  [phone]  Name: Ronald [name] MRN: [id number] Date of Birth: September 25, 1945

## 2016-10-21 ENCOUNTER — Ambulatory Visit: Payer: Medicare Other | Admitting: Physical Therapy

## 2016-10-21 DIAGNOSIS — I69354 Hemiplegia and hemiparesis following cerebral infarction affecting left non-dominant side: Secondary | ICD-10-CM

## 2016-10-21 DIAGNOSIS — M6281 Muscle weakness (generalized): Secondary | ICD-10-CM

## 2016-10-21 DIAGNOSIS — R2689 Other abnormalities of gait and mobility: Secondary | ICD-10-CM

## 2016-10-21 DIAGNOSIS — R262 Difficulty in walking, not elsewhere classified: Secondary | ICD-10-CM

## 2016-10-21 NOTE — Therapy (Signed)
[facility] [address]  [place], [place], [address] Phone: [phone]   Fax:  [phone]  Physical Therapy Treatment  Patient Details  Name: Ronald [name] MRN: [id number] Date of Birth: 27-Jul-1945 Referring Provider: Sarina Ill, MD  Encounter Date: 10/21/2016      PT End of Session - 10/21/16 0930    Visit Number 3   Number of Visits 16   Date for PT Re-Evaluation 12/03/16   Authorization Type UHC Medicare   PT Start Time 0930   PT Stop Time 1020   PT Time Calculation (min) 50 min   Activity Tolerance Patient tolerated treatment well   Behavior During Therapy [facility] for tasks assessed/performed      Past Medical History:  Diagnosis Date  . Cancer [facility])    Prostate  . Chicken pox   . Dementia   . Depression   . Heart disease   . Hyperlipemia   . Hypertension   . Measles   . Mumps   . 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 Stroke [facility])     Past Surgical History:  Procedure Laterality Date  . IR ANGIO EXTERNAL CAROTID SEL EXT CAROTID UNI R MOD SED  07/20/2016  . IR ANGIO INTRA EXTRACRAN SEL INTERNAL CAROTID BILAT MOD SED  07/20/2016  . IR ANGIO VERTEBRAL SEL SUBCLAVIAN INNOMINATE UNI L MOD SED  07/20/2016  . IR ANGIOGRAM EXTREMITY RIGHT  07/20/2016    There were no vitals filed for this visit.      Subjective Assessment - 10/21/16 0935    Subjective Pt reports feeling like he is moving better from what he has done so far with PT.   Pertinent History CVA x 3 (08/2015 - 06/2016) - L hemiparesis   Patient Stated Goals "walk better"   Currently in Pain? No/denies                         [facility] Adult PT Treatment/Exercise - 10/21/16 0930      Knee/Hip Exercises: Aerobic   Nustep lvl 4 x 6' (LE only)             Balance Exercises - 10/21/16 0930      OTAGO PROGRAM   Neck Movements Standing;5 reps   Back Extension Standing;5 reps  back to counter   Trunk Movements Standing;5 reps   Knee Extensor 10 reps   Knee Flexor 10 reps   Hip ABductor 10 reps   Ankle Plantorflexors 20 reps, support   Ankle Dorsiflexors 20 reps, support   Walking and Turning Around No assistive device   Sideways Walking No assistive device   Tandem Stance 10 seconds, support   Tandem Walk Support   One Leg Stand 10 seconds, support   Heel Walking Support   Toe Walk Support   Heel Toe Walking Backward --  Level C=Support   Sit to Stand 10 reps, no support             PT Short Term Goals - 10/14/16 0933      PT SHORT TERM GOAL #1   Title Independent with initial HEP by 10/29/16   Status On-going     PT SHORT TERM GOAL #2   Title Pt to improve gait speed to >/= 2.0 ft/sec for improved efficiency of gait by 10/29/16   Status On-going     PT SHORT TERM GOAL #3   Title Pt  to improve Berg Balance to >/= 38/56 in order to improve functional balance by 10/29/16   Status On-going           PT Long Term Goals - 10/14/16 0933      PT LONG TERM GOAL #1   Title Independent with ongoing HEP by 12/03/16   Status On-going     PT LONG TERM GOAL #2   Title Pt to improve gait speed to >/= 2.62 ft/sec for improved efficiency of gait and functional mobility with community ambulation by 12/03/16   Status On-going     PT LONG TERM GOAL #3   Title Pt to improve TUG to </= 18 sec indicating reduced fall risk by 12/03/16   Status On-going     PT LONG TERM GOAL #4   Title Pt to improve Berg Balance to >/= 45/56 in order to indicate improved independence with functional mobility by 12/03/16   Status On-going               Plan - 10/21/16 0936    Clinical Impression Statement Pt reporting concerns about whether he is performing [place] exercsises/activities appropriately, therefore reviewed items in question providing cues/clarification as needed. After review, pt reporting improved understanding and independence. Completed training in remaining [place] balance  activities not attempted at last visit with pt able to perform all activities at supported level w/o concerns.    Rehab Potential Good   Clinical Impairments Affecting Rehab Potential HTN, dementia, depression, bronchitis, h/o rhabdomyolysis   PT Frequency --  1-2x/wk as pt can afford copay   PT Treatment/Interventions Patient/family education;ADLs/Self Care Home Management;Neuromuscular re-education;Balance training;Therapeutic exercise;Therapeutic activities;Functional mobility training;Gait training;Stair training;Manual techniques;Electrical Stimulation   PT Next Visit Plan LE strengthening; balance training; gait training   PT Home Exercise Plan [place]   Consulted and Agree with Plan of Care Patient      Patient will benefit from skilled therapeutic intervention in order to improve the following deficits and impairments:  Decreased strength, Decreased range of motion, Impaired tone, Decreased mobility, Decreased balance, Difficulty walking, Abnormal gait, Decreased activity tolerance, Decreased endurance, Decreased safety awareness, Impaired UE functional use  Visit Diagnosis: Hemiplegia and hemiparesis following cerebral infarction affecting left non-dominant side (HCC)  Difficulty in walking, not elsewhere classified  Other abnormalities of gait and mobility  Muscle weakness (generalized)     Problem List Patient Active Problem List   Diagnosis Date Noted  . 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 Cerebral thrombosis with cerebral infarction 07/20/2016  . Non-traumatic rhabdomyolysis   . Bronchitis 07/15/2016  . Dementia 07/15/2016  . CAP (community acquired pneumonia) 07/15/2016  . Acute left hemiparesis ([place]) 07/15/2016  . Abnormal urinalysis 07/15/2016  . Effusion of left knee 07/15/2016  . Elevated AST (SGOT) 07/15/2016  . Elevated troponin 07/15/2016  . Lobar pneumonia ([place])   . Prostate cancer ([place]) 10/21/2015  . Other  seasonal allergic rhinitis 10/21/2015  . Essential hypertension 01/16/2014  . Bipolar 1 disorder ([place]) 08/02/2013  . GERD (gastroesophageal reflux disease) 07/04/2013  . Elevated PSA, less than 10 ng/ml 07/04/2013  . BPH with elevated PSA 02/28/2013  . Encounter for Medicare annual wellness exam 02/28/2013  . Hyperlipidemia 02/28/2013    Percival Spanish, PT, MPT 10/21/2016, 11:20 AM  [facility] [address]  [place], [place], [address] Phone: [phone]   Fax:  [phone]  Name: Alanmichael Barmore MRN: [id number] Date of  Birth: 1945-07-22

## 2016-10-28 ENCOUNTER — Ambulatory Visit: Payer: Medicare Other | Attending: Neurology | Admitting: Physical Therapy

## 2016-10-28 DIAGNOSIS — M6281 Muscle weakness (generalized): Secondary | ICD-10-CM | POA: Insufficient documentation

## 2016-10-28 DIAGNOSIS — I69354 Hemiplegia and hemiparesis following cerebral infarction affecting left non-dominant side: Secondary | ICD-10-CM | POA: Insufficient documentation

## 2016-10-28 DIAGNOSIS — R2689 Other abnormalities of gait and mobility: Secondary | ICD-10-CM | POA: Diagnosis present

## 2016-10-28 DIAGNOSIS — R262 Difficulty in walking, not elsewhere classified: Secondary | ICD-10-CM | POA: Insufficient documentation

## 2016-10-28 NOTE — Therapy (Signed)
[facility] [address]  [place], [place], [address] Phone: [phone]   Fax:  [phone]  Physical Therapy Treatment  Patient Details  Name: Ronald [name] MRN: [id number] Date of Birth: [birth year] Referring Provider: Sarina Ill, MD  Encounter Date: 10/28/2016      PT End of Session - 10/28/16 0931    Visit Number 4   Number of Visits 16   Date for PT Re-Evaluation 12/03/16   Authorization Type UHC Medicare   PT Start Time 0931   PT Stop Time 1016   PT Time Calculation (min) 45 min   Activity Tolerance Patient tolerated treatment well   Behavior During Therapy [facility] for tasks assessed/performed      Past Medical History:  Diagnosis Date  . Cancer [facility])    Prostate  . Chicken pox   . Dementia   . Depression   . Heart disease   . Hyperlipemia   . Hypertension   . Measles   . Mumps   . 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 Stroke [facility])     Past Surgical History:  Procedure Laterality Date  . IR ANGIO EXTERNAL CAROTID SEL EXT CAROTID UNI R MOD SED  07/20/2016  . IR ANGIO INTRA EXTRACRAN SEL INTERNAL CAROTID BILAT MOD SED  07/20/2016  . IR ANGIO VERTEBRAL SEL SUBCLAVIAN INNOMINATE UNI L MOD SED  07/20/2016  . IR ANGIOGRAM EXTREMITY RIGHT  07/20/2016    There were no vitals filed for this visit.      Subjective Assessment - 10/28/16 0937    Subjective Pt reports completing [place] program daily except on days when he comes to PT. Denies any questions or concerns with [facility] program.   Pertinent History CVA x 3 (08/2015 - 06/2016) - L hemiparesis   Patient Stated Goals "walk better"   Currently in Pain? No/denies            [facility] PT Assessment - 10/28/16 0931      Ambulation/Gait   Gait velocity 1.49 ft/sec     Standardized Balance Assessment   Standardized Balance Assessment Berg Balance Test;10 meter walk test   10 Meter Walk 22.06"     Berg Balance Test   Sit to  Stand Able to stand without using hands and stabilize independently   Standing Unsupported Able to stand safely 2 minutes   Sitting with Back Unsupported but Feet Supported on Floor or Stool Able to sit safely and securely 2 minutes   Stand to Sit Uses backs of legs against chair to control descent   Transfers Able to transfer safely, minor use of hands   Standing Unsupported with Eyes Closed Able to stand 10 seconds with supervision   Standing Ubsupported with Feet Together Able to place feet together independently and stand 1 minute safely   From Standing, Reach Forward with Outstretched Arm Can reach forward >12 cm safely (5")   From Standing Position, Pick up Object from Floor Able to pick up shoe, needs supervision   From Standing Position, Turn to Look Behind Over each Shoulder Turn sideways only but maintains balance   Turn 360 Degrees Able to turn 360 degrees safely but slowly   Standing Unsupported, Alternately Place Feet on Step/Stool Able to complete >2 steps/needs minimal assist   Standing Unsupported, One Foot in Front Able to plae foot ahead of the other independently and hold 30 seconds   Standing on One Leg Tries to lift  leg/unable to hold 3 seconds but remains standing independently   Total Score 40                     [facility] Adult PT Treatment/Exercise - 10/28/16 0931      Knee/Hip Exercises: Aerobic   Nustep lvl 4 x 6' (LE only)     Knee/Hip Exercises: Machines for Strengthening   Cybex Knee Extension B LE 15# x15   Cybex Knee Flexion B LE 20# x15; B con/L ecc 15# x15     Knee/Hip Exercises: Standing   Side Lunges Both;10 reps   Side Lunges Limitations lateral step over 1/2 FR; TRX UE support   Functional Squat 10 reps;3 seconds   Functional Squat Limitations TRX                  PT Short Term Goals - 10/28/16 9163      PT SHORT TERM GOAL #1   Title Independent with initial HEP by 10/29/16   Status Achieved     PT SHORT TERM GOAL #2    Title Pt to improve gait speed to >/= 2.0 ft/sec for improved efficiency of gait by 10/29/16   Status On-going     PT SHORT TERM GOAL #3   Title Pt to improve Berg Balance to >/= 38/56 in order to improve functional balance by 10/29/16   Status Achieved           PT Long Term Goals - 10/14/16 0933      PT LONG TERM GOAL #1   Title Independent with ongoing HEP by 12/03/16   Status On-going     PT LONG TERM GOAL #2   Title Pt to improve gait speed to >/= 2.62 ft/sec for improved efficiency of gait and functional mobility with community ambulation by 12/03/16   Status On-going     PT LONG TERM GOAL #3   Title Pt to improve TUG to </= 18 sec indicating reduced fall risk by 12/03/16   Status On-going     PT LONG TERM GOAL #4   Title Pt to improve Berg Balance to >/= 45/56 in order to indicate improved independence with functional mobility by 12/03/16   Status On-going               Plan - 10/28/16 0939    Clinical Impression Statement Pt noting benefit from PT with improving strength and ease of mobility. Balance testing via Merrilee Jansky revealed 7 point improvement from 33/56 to 40/56. Pt remains very motivated and compliant with HEP and has continued good potential for further improvement.   Rehab Potential Good   Clinical Impairments Affecting Rehab Potential HTN, dementia, depression, bronchitis, h/o rhabdomyolysis   PT Frequency --  1-2x/wk as pt can afford copay   PT Treatment/Interventions Patient/family education;ADLs/Self Care Home Management;Neuromuscular re-education;Balance training;Therapeutic exercise;Therapeutic activities;Functional mobility training;Gait training;Stair training;Manual techniques;Electrical Stimulation   PT Next Visit Plan LE strengthening; balance training; gait training   PT Home Exercise Plan [place]   Consulted and Agree with Plan of Care Patient      Patient will benefit from skilled therapeutic intervention in order to improve the following deficits and  impairments:  Decreased strength, Decreased range of motion, Impaired tone, Decreased mobility, Decreased balance, Difficulty walking, Abnormal gait, Decreased activity tolerance, Decreased endurance, Decreased safety awareness, Impaired UE functional use  Visit Diagnosis: Hemiplegia and hemiparesis following cerebral infarction affecting left non-dominant side (HCC)  Difficulty in walking, not elsewhere classified  Other  abnormalities of gait and mobility  Muscle weakness (generalized)     Problem List Patient Active Problem List   Diagnosis Date Noted  . Cerebrovascular accident (CVA) due to occlusion of right carotid artery ([place])   . Cerebral thrombosis with cerebral infarction 07/20/2016  . Non-traumatic rhabdomyolysis   . Bronchitis 07/15/2016  . Dementia 07/15/2016  . CAP (community acquired pneumonia) 07/15/2016  . Acute left hemiparesis ([place]) 07/15/2016  . Abnormal urinalysis 07/15/2016  . Effusion of left knee 07/15/2016  . Elevated AST (SGOT) 07/15/2016  . Elevated troponin 07/15/2016  . Lobar pneumonia ([place])   . Prostate cancer ([place]) 10/21/2015  . Other seasonal allergic rhinitis 10/21/2015  . Essential hypertension 01/16/2014  . Bipolar 1 disorder ([place]) 08/02/2013  . GERD (gastroesophageal reflux disease) 07/04/2013  . Elevated PSA, less than 10 ng/ml 07/04/2013  . BPH with elevated PSA 02/28/2013  . Encounter for Medicare annual wellness exam 02/28/2013  . Hyperlipidemia 02/28/2013    Percival Spanish, PT, MPT 10/28/2016, 11:51 AM  [facility] [address]  [place], [place], [address] Phone: [phone]   Fax:  [phone]  Name: Carlus Stay MRN: [id number] Date of Birth: 06-Dec-1945

## 2016-11-04 ENCOUNTER — Ambulatory Visit: Payer: Medicare Other | Admitting: Physical Therapy

## 2016-11-04 DIAGNOSIS — I69354 Hemiplegia and hemiparesis following cerebral infarction affecting left non-dominant side: Secondary | ICD-10-CM | POA: Diagnosis not present

## 2016-11-04 DIAGNOSIS — R2689 Other abnormalities of gait and mobility: Secondary | ICD-10-CM

## 2016-11-04 DIAGNOSIS — R262 Difficulty in walking, not elsewhere classified: Secondary | ICD-10-CM

## 2016-11-04 DIAGNOSIS — M6281 Muscle weakness (generalized): Secondary | ICD-10-CM

## 2016-11-04 NOTE — Therapy (Addendum)
[facility] [place] [address]  [place], [place], [address] Phone: [phone]   Fax:  [phone]  Physical Therapy Treatment  Patient Details  Name: Ronald [name] MRN: [id number] Date of Birth: 04-01-1945 Referring Provider: Sarina Ill, MD  Encounter Date: 11/04/2016      PT End of Session - 11/04/16 0934    Visit Number 5   Number of Visits 16   Date for PT Re-Evaluation 12/03/16   Authorization Type [employer] Medicare   PT Start Time 0934   PT Stop Time 1017   PT Time Calculation (min) 43 min   Activity Tolerance Patient tolerated treatment well   Behavior During Therapy [facility] for tasks assessed/performed      Past Medical History:  Diagnosis Date  . Cancer [facility])    Prostate  . Chicken pox   . Dementia   . Depression   . Heart disease   . Hyperlipemia   . Hypertension   . Measles   . Mumps   . 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 Stroke [facility])     Past Surgical History:  Procedure Laterality Date  . IR ANGIO EXTERNAL CAROTID SEL EXT CAROTID UNI R MOD SED  07/20/2016  . IR ANGIO INTRA EXTRACRAN SEL INTERNAL CAROTID BILAT MOD SED  07/20/2016  . IR ANGIO VERTEBRAL SEL SUBCLAVIAN INNOMINATE UNI L MOD SED  07/20/2016  . IR ANGIOGRAM EXTREMITY RIGHT  07/20/2016    There were no vitals filed for this visit.      Subjective Assessment - 11/04/16 0938    Subjective Pt feels like therapy is helping.   Pertinent History CVA x 3 (08/2015 - 06/2016) - L hemiparesis   Patient Stated Goals "walk better"   Currently in Pain? No/denies                         [facility] Adult PT Treatment/Exercise - 11/04/16 0934      Ambulation/Gait   Ambulation/Gait Assistance 5: Supervision   Ambulation/Gait Assistance Details Cues to promote carryover of increasef stride length and foot clearance to promote increased gait velocity,   Ambulation Distance (Feet) 450 Feet   Gait Pattern Decreased  dorsiflexion - left;Decreased hip/knee flexion - left;Poor foot clearance - left;Decreased step length - left;Decreased stride length;Step-through pattern   Ambulation Surface Level;Indoor     Neuro Re-ed    Neuro Re-ed Details  PWR! fwd & back step & fwd/back combined step x20 each to promote icreased stried length     Knee/Hip Exercises: Aerobic   Nustep lvl 5 x 6' (LE only)             Balance Exercises - 11/04/16 0934      Balance Exercises: Standing   Retro Gait 5 reps  focus on increased stride length, UE support on counter   Sidestepping 4 reps  focus on increased stride length, UE support on counter   Step Over Hurdles / [place] Alt step over balance bubbles to promote increased foot clearance & stride length. Walking stepping over progressively larger hurdles - pt requiring pause at each hurdle before initiating step-over.             PT Short Term Goals - 10/28/16 0939      PT SHORT TERM GOAL #1   Title Independent with initial HEP by 10/29/16   Status Achieved     PT SHORT TERM GOAL #2  Title Pt to improve gait speed to >/= 2.0 ft/sec for improved efficiency of gait by 10/29/16   Status On-going     PT SHORT TERM GOAL #3   Title Pt to improve Berg Balance to >/= 38/56 in order to improve functional balance by 10/29/16   Status Achieved           PT Long Term Goals - 10/14/16 0933      PT LONG TERM GOAL #1   Title Independent with ongoing HEP by 12/03/16   Status On-going     PT LONG TERM GOAL #2   Title Pt to improve gait speed to >/= 2.62 ft/sec for improved efficiency of gait and functional mobility with community ambulation by 12/03/16   Status On-going     PT LONG TERM GOAL #3   Title Pt to improve TUG to </= 18 sec indicating reduced fall risk by 12/03/16   Status On-going     PT LONG TERM GOAL #4   Title Pt to improve Berg Balance to >/= 45/56 in order to indicate improved independence with functional mobility by 12/03/16   Status On-going                Plan - 11/04/16 0940    Clinical Impression Statement Ronald [name] continues to demonstrate shufflig gait with decreased stride/step length and decreased foot clearance resulting in decreased cadence/gait speed. Therapy session focused on strategies and activities to promote increased step length and improved foot clearance for more natural stride and improved gait speed and stability with pt demonstrating improvement by end of session.   Rehab Potential Good   Clinical Impairments Affecting Rehab Potential HTN, dementia, depression, bronchitis, h/o rhabdomyolysis   PT Frequency --  1-2x/wk as pt can afford copay   PT Treatment/Interventions Patient/family education;ADLs/Self Care Home Management;Neuromuscular re-education;Balance training;Therapeutic exercise;Therapeutic activities;Functional mobility training;Gait training;Stair training;Manual techniques;Electrical Stimulation   PT Next Visit Plan LE strengthening; balance training; gait training   PT Home Exercise Plan [place]   Consulted and Agree with Plan of Care Patient      Patient will benefit from skilled therapeutic intervention in order to improve the following deficits and impairments:  Decreased strength, Decreased range of motion, Impaired tone, Decreased mobility, Decreased balance, Difficulty walking, Abnormal gait, Decreased activity tolerance, Decreased endurance, Decreased safety awareness, Impaired UE functional use  Visit Diagnosis: Hemiplegia and hemiparesis following cerebral infarction affecting left non-dominant side (HCC)  Difficulty in walking, not elsewhere classified  Other abnormalities of gait and mobility  Muscle weakness (generalized)     Problem List Patient Active Problem List   Diagnosis Date Noted  . Cerebrovascular accident (CVA) due to occlusion of right carotid artery (HCC)   . Cerebral thrombosis with cerebral infarction 07/20/2016  . Non-traumatic rhabdomyolysis   . Bronchitis  07/15/2016  . Dementia 07/15/2016  . CAP (community acquired pneumonia) 07/15/2016  . Acute left hemiparesis (HCC) 07/15/2016  . Abnormal urinalysis 07/15/2016  . Effusion of left knee 07/15/2016  . Elevated AST (SGOT) 07/15/2016  . Elevated troponin 07/15/2016  . Lobar pneumonia (HCC)   . Prostate cancer (HCC) 10/21/2015  . Other seasonal allergic rhinitis 10/21/2015  . Essential hypertension 01/16/2014  . Bipolar 1 disorder (HCC) 08/02/2013  . GERD (gastroesophageal reflux disease) 07/04/2013  . Elevated PSA, less than 10 ng/ml 07/04/2013  . BPH with elevated PSA 02/28/2013  . Encounter for Medicare annual wellness exam 02/28/2013  . Hyperlipidemia 02/28/2013     M , PT, MPT 11/04/2016, 12:58 PM    [facility] [place] [address]  [address] [place], [place], [address] Phone: [phone]   Fax:  [phone]  Name: Ronald [name] MRN: [id number] Date of Birth: 03/21/1946  PHYSICAL THERAPY DISCHARGE SUMMARY  Visits from Start of Care: 5  Current functional level related to goals / functional outcomes:   Unable to formally assess as pt no showed for last 3 scheduled visits and has not returned in >30 day. Will proceed with D/C from PT for this episode per NS policy.   Remaining deficits:   As above.   Education / Equipment:   HEP   G-Codes - 11/04/2016   Functional Assessment Tool Used (Outpatient Only) Clinical judgement   Functional Limitation Mobility: Walking and moving around   Mobility: Walking and Moving Around Goal Status (G8979) At least 20 percent but less than 40 percent impaired, limited or restricted   Mobility: Walking and Moving Around Discharge Status (G8980) At least 40 percent but less than 60 percent impaired, limited or restricted      Plan: Patient agrees to discharge.  Patient goals were partially met. Patient is being discharged due to not returning since the last visit.  ?????      M. , PT, MPT 12/07/16, 10:24 AM  [facility] [place] [address]  [address] [place], [place], [address] Phone: [phone]   Fax:  [phone]    

## 2016-11-11 ENCOUNTER — Ambulatory Visit: Payer: Medicare Other | Admitting: Physical Therapy

## 2016-11-18 ENCOUNTER — Ambulatory Visit: Payer: Medicare Other | Admitting: Physical Therapy

## 2016-11-25 ENCOUNTER — Ambulatory Visit: Payer: Medicare Other | Admitting: Physical Therapy

## 2017-03-09 NOTE — Progress Notes (Deleted)
[facility]  PATIENT: Ronald [name] DOB: 02-Jul-1945   REASON FOR VISIT: Follow-up for stroke HISTORY FROM:    HISTORY OF PRESENT ILLNESS:Ronald [name] is a 71 y.o. male here as a referral from Dr. Erlinda Hong for stroke follow up. Past medical history dementia, depression, hyperlipidemia, hypertension, schizophrenia symptoms with depression. Per a review of records: Patient was seen in the emergency room on 07/16/2016 with cough and fever and weakness on his left side with a fall. Weakness started about 3 weeks previous. The morning he was seen he fell asleep in his chair and fell out of his chair to the ground landing on his left side and she has had pain in his left leg since then and difficulty moving the leg due to pain. Family said patient complained of left arm and left leg pain after falling and not previously. Chest x-ray revealed lower lobe pneumonia and he was admitted. MRI of the brain showed no evidence of acute ischemia but did show some chronic right hemisphere ischemic change.  Patient is here with brother who also provides information. Patient says he fell on the ground and hit his left arm and feels better. He feels improved. No left arm numbness. He feels his left arm is better and his left side is better.    REVIEW OF SYSTEMS: Full 14 system review of systems performed and notable only for those listed, all others are neg:  Constitutional: neg  Cardiovascular: neg Ear/Nose/Throat: neg  Skin: neg Eyes: neg Respiratory: neg Gastroitestinal: neg  Hematology/Lymphatic: neg  Endocrine: neg Musculoskeletal:neg Allergy/Immunology: neg Neurological: neg Psychiatric: neg Sleep : neg   ALLERGIES: Allergies  Allergen Reactions  . Dust Mite Extract Other (See Comments)    Allergy symptoms   . Pollen Extract Other (See Comments)    Allergy symptoms   . Rye Grass Flower Pollen Extract [Gramineae Pollens] Other (See Comments)    Allergy Symptoms      HOME MEDICATIONS: Outpatient Medications Prior to Visit  Medication Sig Dispense Refill  . ARIPiprazole (ABILIFY) 10 MG tablet TAKE 1 TABLET BY MOUTH EVERY DAY 90 tablet 1  . aspirin EC 325 MG EC tablet Take 1 tablet (325 mg total) by mouth daily. (Patient not taking: Reported on 10/05/2016) 30 tablet 0  . atorvastatin (LIPITOR) 20 MG tablet Take 1 tablet (20 mg total) by mouth daily at 6 PM. 30 tablet 6  . Calcium-Vitamin D 600-200 MG-UNIT per tablet Take 1 tablet by mouth daily.    . cloNIDine (CATAPRES) 0.1 MG tablet Take 1 tablet (0.1 mg total) by mouth 3 (three) times daily. 60 tablet 11  . clopidogrel (PLAVIX) 75 MG tablet Take 1 tablet (75 mg total) by mouth daily. 30 tablet 0  . finasteride (PROSCAR) 5 MG tablet Take 1 tablet (5 mg total) by mouth daily. 30 tablet 0  . fluticasone (FLONASE) 50 MCG/ACT nasal spray Place 2 sprays into both nostrils daily. 16 g 6  . lisinopril (PRINIVIL,ZESTRIL) 30 MG tablet TAKE 1 TABLET (30 MG TOTAL) BY MOUTH DAILY. 30 tablet 0  . metoprolol (LOPRESSOR) 50 MG tablet TAKE 1 TABLET BY MOUTH TWICE A DAY 60 tablet 0  . oxyCODONE (OXY IR/ROXICODONE) 5 MG immediate release tablet Take 1 tablet (5 mg total) by mouth every 4 (four) hours as needed for moderate pain. (Patient not taking: Reported on 10/05/2016) 10 tablet 0  . ranitidine (ZANTAC) 150 MG capsule TAKE ONE CAPSULE BY MOUTH TWICE A DAY (Patient not taking: Reported on 10/05/2016) 180  capsule 1   No facility-administered medications prior to visit.     PAST MEDICAL HISTORY: Past Medical History:  Diagnosis Date  . Cancer [facility])    Prostate  . Chicken pox   . Dementia   . Depression   . Heart disease   . Hyperlipemia   . Hypertension   . Measles   . Mumps   . 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 Stroke [facility])     PAST SURGICAL HISTORY: Past Surgical History:  Procedure Laterality Date  . IR ANGIO EXTERNAL CAROTID SEL EXT CAROTID UNI R MOD SED  07/20/2016  . IR ANGIO  INTRA EXTRACRAN SEL INTERNAL CAROTID BILAT MOD SED  07/20/2016  . IR ANGIO VERTEBRAL SEL SUBCLAVIAN INNOMINATE UNI L MOD SED  07/20/2016  . IR ANGIOGRAM EXTREMITY RIGHT  07/20/2016    FAMILY HISTORY: Family History  Problem Relation Age of Onset  . Hypertension Father   . Hyperlipidemia Father   . Congestive Heart Failure Father 9       Deceased-1995  . Diabetes Mother   . Kidney failure Mother 51       Deceased-2005  . Hypertension Mother   . Congestive Heart Failure Maternal Grandmother   . Heart failure Paternal Grandmother   . Heart attack Paternal Grandmother     SOCIAL HISTORY: Social History   Socioeconomic History  . Marital status: Single    Spouse name: Not on file  . Number of children: 0  . Years of education: Not on file  . Highest education level: Not on file  Social Needs  . Financial resource strain: Not on file  . Food insecurity - worry: Not on file  . Food insecurity - inability: Not on file  . Transportation needs - medical: Not on file  . Transportation needs - non-medical: Not on file  Occupational History  . Not on file  Tobacco Use  . Smoking status: Never Smoker  . Smokeless tobacco: Never Used  Substance and Sexual Activity  . Alcohol use: No  . Drug use: No  . Sexual activity: No  Other Topics Concern  . Not on file  Social History Narrative   Lives at home w/ brother and his wife   Right-handed   Caffeine: occasional coffee or green tea     PHYSICAL EXAM  There were no vitals filed for this visit. There is no height or weight on file to calculate BMI.  Generalized: Well developed, in no acute distress  Head: normocephalic and atraumatic,. Oropharynx benign  Neck: Supple, no carotid bruits  Cardiac: Regular rate rhythm, no murmur  Musculoskeletal: No deformity   Neurological examination   Mentation: Alert oriented to time, place, history taking. Attention span and concentration appropriate. Recent and remote memory intact.   Follows all commands speech and language fluent.   Cranial nerve II-XII: Fundoscopic exam reveals sharp disc margins.Pupils were equal round reactive to light extraocular movements were full, visual field were full on confrontational test. Facial sensation and strength were normal. hearing was intact to finger rubbing bilaterally. Uvula tongue midline. head turning and shoulder shrug were normal and symmetric.Tongue protrusion into cheek strength was normal. Motor: normal bulk and tone, full strength in the BUE, BLE, fine finger movements normal, no pronator drift. No focal weakness Sensory: normal and symmetric to light touch, pinprick, and  Vibration, proprioception  Coordination: finger-nose-finger, heel-to-shin bilaterally, no dysmetria Reflexes: Brachioradialis 2/2, biceps 2/2, triceps 2/2, patellar 2/2, Achilles 2/2, plantar responses  were flexor bilaterally. Gait and Station: Rising up from seated position without assistance, normal stance,  moderate stride, good arm swing, smooth turning, able to perform tiptoe, and heel walking without difficulty. Tandem gait is steady  DIAGNOSTIC DATA (LABS, IMAGING, TESTING) - I reviewed patient records, labs, notes, testing and imaging myself where available.  Lab Results  Component Value Date   WBC 5.2 07/21/2016   HGB 10.8 (L) 07/21/2016   HCT 34.5 (L) 07/21/2016   MCV 92.7 07/21/2016   PLT 241 07/21/2016      Component Value Date/Time   NA 137 07/21/2016 0615   K 4.1 07/21/2016 0615   CL 102 07/21/2016 0615   CO2 27 07/21/2016 0615   GLUCOSE 109 (H) 07/21/2016 0615   BUN 13 07/21/2016 0615   CREATININE 0.87 07/21/2016 0615   CREATININE 0.99 10/15/2013 0755   CALCIUM 9.1 07/21/2016 0615   PROT 6.3 (L) 07/21/2016 0615   ALBUMIN 3.1 (L) 07/21/2016 0615   AST 36 07/21/2016 0615   ALT 46 07/21/2016 0615   ALKPHOS 64 07/21/2016 0615   BILITOT 0.5 07/21/2016 0615   GFRNONAA >60 07/21/2016 0615   GFRAA >60 07/21/2016 0615   Lab Results    Component Value Date   CHOL 146 07/17/2016   HDL 40 (L) 07/17/2016   LDLCALC 86 07/17/2016   TRIG 100 07/17/2016   CHOLHDL 3.7 07/17/2016   Lab Results  Component Value Date   HGBA1C 5.4 07/16/2016   No results found for: [id number] Lab Results  Component Value Date   TSH 1.142 07/15/2016    ***  ASSESSMENT AND PLAN  71 y.o. 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 here with ***  71 y.o. male here as a referral from Dr. Erlinda Hong for stroke follow up. Past medical history dementia, depression, hyperlipidemia, hypertension, schizophrenia symptoms with depression.  MRI of the brain showed nothing acute however a right subcortical infarct due to right ICA occlusion versus high-grade stenosis was suspected. MRI showed right cerebral white matter [place] is an abnormal appearance of the right ICA. LDL was 86, hemoglobin A1c 5.4, he was discharged into antiplatelet therapy for 3 months and then Plavix alone. No further intervention of the right ICA needed. Blood pressure goal is slightly higher than 130 - 150 due to right ICA occlusion. Cholesterol goal is less than 70 and he was started on Lipitor. I do not think he needs Lipitor 80 mg but will ask that he follow-up with primary care about this and with ask him to take half. Other stroke risk factors include advanced age and obesity. He was treated in the hospital for community-acquired pneumonia as well as rhabdomyolysis due to fall and being found on the ground.  Dennie Bible, [facility], [facility], APRN  [facility] [address] [place], [place] [address] [phone]

## 2017-03-10 ENCOUNTER — Ambulatory Visit: Payer: Medicare Other | Admitting: Nurse Practitioner

## 2017-03-16 NOTE — Progress Notes (Deleted)
[facility]  PATIENT: Ronald [name] DOB: 1945-06-10   REASON FOR VISIT: Follow-up for stroke HISTORY FROM:    HISTORY OF PRESENT ILLNESS:Ronald [name] is a 71 y.o. male here as a referral from Dr. Erlinda [name] for stroke follow up. Past medical history dementia, depression, hyperlipidemia, hypertension, schizophrenia symptoms with depression. Per a review of records: Patient was seen in the emergency room on 07/16/2016 with cough and fever and weakness on his left side with a fall. Weakness started about 3 weeks previous. The morning he was seen he fell asleep in his chair and fell out of his chair to the ground landing on his left side and she has had pain in his left leg since then and difficulty moving the leg due to pain. Family said patient complained of left arm and left leg pain after falling and not previously. Chest x-ray revealed lower lobe pneumonia and he was admitted. MRI of the brain showed no evidence of acute ischemia but did show some chronic right hemisphere ischemic change.  Patient is here with brother who also provides information. Patient says he fell on the ground and hit his left arm and feels better. He feels improved. No left arm numbness. He feels his left arm is better and his left side is better.    REVIEW OF SYSTEMS: Full 14 system review of systems performed and notable only for those listed, all others are neg:  Constitutional: neg  Cardiovascular: neg Ear/Nose/Throat: neg  Skin: neg Eyes: neg Respiratory: neg Gastroitestinal: neg  Hematology/Lymphatic: neg  Endocrine: neg Musculoskeletal:neg Allergy/Immunology: neg Neurological: neg Psychiatric: neg Sleep : neg   ALLERGIES: Allergies  Allergen Reactions  . Dust Mite Extract Other (See Comments)    Allergy symptoms   . Pollen Extract Other (See Comments)    Allergy symptoms   . Rye Grass Flower Pollen Extract [Gramineae Pollens] Other (See Comments)    Allergy Symptoms      HOME MEDICATIONS: Outpatient Medications Prior to Visit  Medication Sig Dispense Refill  . ARIPiprazole (ABILIFY) 10 MG tablet TAKE 1 TABLET BY MOUTH EVERY DAY 90 tablet 1  . aspirin EC 325 MG EC tablet Take 1 tablet (325 mg total) by mouth daily. (Patient not taking: Reported on 10/05/2016) 30 tablet 0  . atorvastatin (LIPITOR) 20 MG tablet Take 1 tablet (20 mg total) by mouth daily at 6 PM. 30 tablet 6  . Calcium-Vitamin D 600-200 MG-UNIT per tablet Take 1 tablet by mouth daily.    . cloNIDine (CATAPRES) 0.1 MG tablet Take 1 tablet (0.1 mg total) by mouth 3 (three) times daily. 60 tablet 11  . clopidogrel (PLAVIX) 75 MG tablet Take 1 tablet (75 mg total) by mouth daily. 30 tablet 0  . finasteride (PROSCAR) 5 MG tablet Take 1 tablet (5 mg total) by mouth daily. 30 tablet 0  . fluticasone (FLONASE) 50 MCG/ACT nasal spray Place 2 sprays into both nostrils daily. 16 g 6  . lisinopril (PRINIVIL,ZESTRIL) 30 MG tablet TAKE 1 TABLET (30 MG TOTAL) BY MOUTH DAILY. 30 tablet 0  . metoprolol (LOPRESSOR) 50 MG tablet TAKE 1 TABLET BY MOUTH TWICE A DAY 60 tablet 0  . oxyCODONE (OXY IR/ROXICODONE) 5 MG immediate release tablet Take 1 tablet (5 mg total) by mouth every 4 (four) hours as needed for moderate pain. (Patient not taking: Reported on 10/05/2016) 10 tablet 0  . ranitidine (ZANTAC) 150 MG capsule TAKE ONE CAPSULE BY MOUTH TWICE A DAY (Patient not taking: Reported on 10/05/2016) 180  capsule 1   No facility-administered medications prior to visit.     PAST MEDICAL HISTORY: Past Medical History:  Diagnosis Date  . Cancer [facility])    Prostate  . Chicken pox   . Dementia   . Depression   . Heart disease   . Hyperlipemia   . Hypertension   . Measles   . Mumps   . 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 Stroke [facility])     PAST SURGICAL HISTORY: Past Surgical History:  Procedure Laterality Date  . IR ANGIO EXTERNAL CAROTID SEL EXT CAROTID UNI R MOD SED  07/20/2016  . IR ANGIO  INTRA EXTRACRAN SEL INTERNAL CAROTID BILAT MOD SED  07/20/2016  . IR ANGIO VERTEBRAL SEL SUBCLAVIAN INNOMINATE UNI L MOD SED  07/20/2016  . IR ANGIOGRAM EXTREMITY RIGHT  07/20/2016    FAMILY HISTORY: Family History  Problem Relation Age of Onset  . Hypertension Father   . Hyperlipidemia Father   . Congestive Heart Failure Father 45       Deceased-1995  . Diabetes Mother   . Kidney failure Mother 13       Deceased-2005  . Hypertension Mother   . Congestive Heart Failure Maternal Grandmother   . Heart failure Paternal Grandmother   . Heart attack Paternal Grandmother     SOCIAL HISTORY: Social History   Socioeconomic History  . Marital status: Single    Spouse name: Not on file  . Number of children: 0  . Years of education: Not on file  . Highest education level: Not on file  Social Needs  . Financial resource strain: Not on file  . Food insecurity - worry: Not on file  . Food insecurity - inability: Not on file  . Transportation needs - medical: Not on file  . Transportation needs - non-medical: Not on file  Occupational History  . Not on file  Tobacco Use  . Smoking status: Never Smoker  . Smokeless tobacco: Never Used  Substance and Sexual Activity  . Alcohol use: No  . Drug use: No  . Sexual activity: No  Other Topics Concern  . Not on file  Social History Narrative   Lives at home w/ brother and his wife   Right-handed   Caffeine: occasional coffee or green tea     PHYSICAL EXAM  There were no vitals filed for this visit. There is no height or weight on file to calculate BMI.  Generalized: Well developed, in no acute distress  Head: normocephalic and atraumatic,. Oropharynx benign  Neck: Supple, no carotid bruits  Cardiac: Regular rate rhythm, no murmur  Musculoskeletal: No deformity   Neurological examination   Mentation: Alert oriented to time, place, history taking. Attention span and concentration appropriate. Recent and remote memory intact.   Follows all commands speech and language fluent.   Cranial nerve II-XII: Fundoscopic exam reveals sharp disc margins.Pupils were equal round reactive to light extraocular movements were full, visual field were full on confrontational test. Facial sensation and strength were normal. hearing was intact to finger rubbing bilaterally. Uvula tongue midline. head turning and shoulder shrug were normal and symmetric.Tongue protrusion into cheek strength was normal. Motor: normal bulk and tone, full strength in the BUE, BLE, fine finger movements normal, no pronator drift. No focal weakness Sensory: normal and symmetric to light touch, pinprick, and  Vibration, proprioception  Coordination: finger-nose-finger, heel-to-shin bilaterally, no dysmetria Reflexes: Brachioradialis 2/2, biceps 2/2, triceps 2/2, patellar 2/2, Achilles 2/2, plantar responses  were flexor bilaterally. Gait and Station: Rising up from seated position without assistance, normal stance,  moderate stride, good arm swing, smooth turning, able to perform tiptoe, and heel walking without difficulty. Tandem gait is steady  DIAGNOSTIC DATA (LABS, IMAGING, TESTING) - I reviewed patient records, labs, notes, testing and imaging myself where available.  Lab Results  Component Value Date   WBC 5.2 07/21/2016   HGB 10.8 (L) 07/21/2016   HCT 34.5 (L) 07/21/2016   MCV 92.7 07/21/2016   PLT 241 07/21/2016      Component Value Date/Time   NA 137 07/21/2016 0615   K 4.1 07/21/2016 0615   CL 102 07/21/2016 0615   CO2 27 07/21/2016 0615   GLUCOSE 109 (H) 07/21/2016 0615   BUN 13 07/21/2016 0615   CREATININE 0.87 07/21/2016 0615   CREATININE 0.99 10/15/2013 0755   CALCIUM 9.1 07/21/2016 0615   PROT 6.3 (L) 07/21/2016 0615   ALBUMIN 3.1 (L) 07/21/2016 0615   AST 36 07/21/2016 0615   ALT 46 07/21/2016 0615   ALKPHOS 64 07/21/2016 0615   BILITOT 0.5 07/21/2016 0615   GFRNONAA >60 07/21/2016 0615   GFRAA >60 07/21/2016 0615   Lab Results    Component Value Date   CHOL 146 07/17/2016   HDL 40 (L) 07/17/2016   LDLCALC 86 07/17/2016   TRIG 100 07/17/2016   CHOLHDL 3.7 07/17/2016   Lab Results  Component Value Date   HGBA1C 5.4 07/16/2016   No results found for: [id number] Lab Results  Component Value Date   TSH 1.142 07/15/2016    ***  ASSESSMENT AND PLAN   71 y.o. male here as a referral from Dr. Erlinda [name] for stroke follow up. Past medical history dementia, depression, hyperlipidemia, hypertension, schizophrenia symptoms with depression.  MRI of the brain showed nothing acute however a right subcortical infarct due to right ICA occlusion versus high-grade stenosis was suspected. MRI showed right cerebral white matter [place] is an abnormal appearance of the right ICA. LDL was 86, hemoglobin A1c 5.4, he was discharged into antiplatelet therapy for 3 months and then Plavix alone. No further intervention of the right ICA needed. Blood pressure goal is slightly higher than 130 - 150 due to right ICA occlusion. Cholesterol goal is less than 70 and he was started on Lipitor. I do not think he needs Lipitor 80 mg but will ask that he follow-up with primary care about this and with ask him to take half. Other stroke risk factors include advanced age and obesity. He was treated in the hospital for community-acquired pneumonia as well as rhabdomyolysis due to fall and being found on the ground.  Dennie Bible, [facility], [facility], APRN  [facility] [address], [place], [place] [address] [phone]

## 2017-03-17 ENCOUNTER — Ambulatory Visit: Payer: Medicare Other | Admitting: Nurse Practitioner

## 2017-06-08 NOTE — Progress Notes (Deleted)
[facility]  PATIENT: Ronald [name] DOB: 23-Feb-1946   REASON FOR VISIT: *** HISTORY FROM:    HISTORY OF PRESENT ILLNESS: 09/21/16 AAJulius [name] is a 72 y.o. male here as a referral from Dr. Erlinda Hong for stroke follow up. Past medical history dementia, depression, hyperlipidemia, hypertension, schizophrenia symptoms with depression. Per a review of records: Patient was seen in the emergency room on 07/16/2016 with cough and fever and weakness on his left side with a fall. Weakness started about 3 weeks previous. The morning he was seen he fell asleep in his chair and fell out of his chair to the ground landing on his left side and she has had pain in his left leg since then and difficulty moving the leg due to pain. Family said patient complained of left arm and left leg pain after falling and not previously. Chest x-ray revealed lower lobe pneumonia and he was admitted. MRI of the brain showed no evidence of acute ischemia but did show some chronic right hemisphere ischemic change.  Patient is here with brother who also provides information. Patient says he fell on the ground and hit his left arm and feels better. He feels improved. No left arm numbness. He feels his left arm is better and his left side is better.    REVIEW OF SYSTEMS: Full 14 system review of systems performed and notable only for those listed, all others are neg:  Constitutional: neg  Cardiovascular: neg Ear/Nose/Throat: neg  Skin: neg Eyes: neg Respiratory: neg Gastroitestinal: neg  Hematology/Lymphatic: neg  Endocrine: neg Musculoskeletal:neg Allergy/Immunology: neg Neurological: neg Psychiatric: neg Sleep : neg   ALLERGIES: Allergies  Allergen Reactions  . Dust Mite Extract Other (See Comments)    Allergy symptoms   . Pollen Extract Other (See Comments)    Allergy symptoms   . Rye Grass Flower Pollen Extract [Gramineae Pollens] Other (See Comments)    Allergy Symptoms      HOME MEDICATIONS: Outpatient Medications Prior to Visit  Medication Sig Dispense Refill  . ARIPiprazole (ABILIFY) 10 MG tablet TAKE 1 TABLET BY MOUTH EVERY DAY 90 tablet 1  . aspirin EC 325 MG EC tablet Take 1 tablet (325 mg total) by mouth daily. (Patient not taking: Reported on 10/05/2016) 30 tablet 0  . atorvastatin (LIPITOR) 20 MG tablet Take 1 tablet (20 mg total) by mouth daily at 6 PM. 30 tablet 6  . Calcium-Vitamin D 600-200 MG-UNIT per tablet Take 1 tablet by mouth daily.    . cloNIDine (CATAPRES) 0.1 MG tablet Take 1 tablet (0.1 mg total) by mouth 3 (three) times daily. 60 tablet 11  . clopidogrel (PLAVIX) 75 MG tablet Take 1 tablet (75 mg total) by mouth daily. 30 tablet 0  . finasteride (PROSCAR) 5 MG tablet Take 1 tablet (5 mg total) by mouth daily. 30 tablet 0  . fluticasone (FLONASE) 50 MCG/ACT nasal spray Place 2 sprays into both nostrils daily. 16 g 6  . lisinopril (PRINIVIL,ZESTRIL) 30 MG tablet TAKE 1 TABLET (30 MG TOTAL) BY MOUTH DAILY. 30 tablet 0  . metoprolol (LOPRESSOR) 50 MG tablet TAKE 1 TABLET BY MOUTH TWICE A DAY 60 tablet 0  . oxyCODONE (OXY IR/ROXICODONE) 5 MG immediate release tablet Take 1 tablet (5 mg total) by mouth every 4 (four) hours as needed for moderate pain. (Patient not taking: Reported on 10/05/2016) 10 tablet 0  . ranitidine (ZANTAC) 150 MG capsule TAKE ONE CAPSULE BY MOUTH TWICE A DAY (Patient not taking: Reported on 10/05/2016) 180  capsule 1   No facility-administered medications prior to visit.     PAST MEDICAL HISTORY: Past Medical History:  Diagnosis Date  . Cancer [facility])    Prostate  . Chicken pox   . Dementia   . Depression   . Heart disease   . Hyperlipemia   . Hypertension   . Measles   . Mumps   . 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 Stroke [facility])     PAST SURGICAL HISTORY: Past Surgical History:  Procedure Laterality Date  . IR ANGIO EXTERNAL CAROTID SEL EXT CAROTID UNI R MOD SED  07/20/2016  . IR ANGIO  INTRA EXTRACRAN SEL INTERNAL CAROTID BILAT MOD SED  07/20/2016  . IR ANGIO VERTEBRAL SEL SUBCLAVIAN INNOMINATE UNI L MOD SED  07/20/2016  . IR ANGIOGRAM EXTREMITY RIGHT  07/20/2016    FAMILY HISTORY: Family History  Problem Relation Age of Onset  . Hypertension Father   . Hyperlipidemia Father   . Congestive Heart Failure Father 23       Deceased-1995  . Diabetes Mother   . Kidney failure Mother 61       Deceased-2005  . Hypertension Mother   . Congestive Heart Failure Maternal Grandmother   . Heart failure Paternal Grandmother   . Heart attack Paternal Grandmother     SOCIAL HISTORY: Social History   Socioeconomic History  . Marital status: Single    Spouse name: Not on file  . Number of children: 0  . Years of education: Not on file  . Highest education level: Not on file  Social Needs  . Financial resource strain: Not on file  . Food insecurity - worry: Not on file  . Food insecurity - inability: Not on file  . Transportation needs - medical: Not on file  . Transportation needs - non-medical: Not on file  Occupational History  . Not on file  Tobacco Use  . Smoking status: Never Smoker  . Smokeless tobacco: Never Used  Substance and Sexual Activity  . Alcohol use: No  . Drug use: No  . Sexual activity: No  Other Topics Concern  . Not on file  Social History Narrative   Lives at home w/ brother and his wife   Right-handed   Caffeine: occasional coffee or green tea     PHYSICAL EXAM  There were no vitals filed for this visit. There is no height or weight on file to calculate BMI.  Generalized: Well developed, in no acute distress  Head: normocephalic and atraumatic,. Oropharynx benign  Neck: Supple, no carotid bruits  Cardiac: Regular rate rhythm, no murmur  Musculoskeletal: No deformity   Neurological examination   Mentation: Alert oriented to time, place, history taking. Attention span and concentration appropriate. Recent and remote memory intact.   Follows all commands speech and language fluent.   Cranial nerve II-XII: Fundoscopic exam reveals sharp disc margins.Pupils were equal round reactive to light extraocular movements were full, visual field were full on confrontational test. Facial sensation and strength were normal. hearing was intact to finger rubbing bilaterally. Uvula tongue midline. head turning and shoulder shrug were normal and symmetric.Tongue protrusion into cheek strength was normal. Motor: normal bulk and tone, full strength in the BUE, BLE, fine finger movements normal, no pronator drift. No focal weakness Sensory: normal and symmetric to light touch, pinprick, and  Vibration, proprioception  Coordination: finger-nose-finger, heel-to-shin bilaterally, no dysmetria Reflexes: Brachioradialis 2/2, biceps 2/2, triceps 2/2, patellar 2/2, Achilles 2/2, plantar responses  were flexor bilaterally. Gait and Station: Rising up from seated position without assistance, normal stance,  moderate stride, good arm swing, smooth turning, able to perform tiptoe, and heel walking without difficulty. Tandem gait is steady  DIAGNOSTIC DATA (LABS, IMAGING, TESTING) - I reviewed patient records, labs, notes, testing and imaging myself where available.  Lab Results  Component Value Date   WBC 5.2 07/21/2016   HGB 10.8 (L) 07/21/2016   HCT 34.5 (L) 07/21/2016   MCV 92.7 07/21/2016   PLT 241 07/21/2016      Component Value Date/Time   NA 137 07/21/2016 0615   K 4.1 07/21/2016 0615   CL 102 07/21/2016 0615   CO2 27 07/21/2016 0615   GLUCOSE 109 (H) 07/21/2016 0615   BUN 13 07/21/2016 0615   CREATININE 0.87 07/21/2016 0615   CREATININE 0.99 10/15/2013 0755   CALCIUM 9.1 07/21/2016 0615   PROT 6.3 (L) 07/21/2016 0615   ALBUMIN 3.1 (L) 07/21/2016 0615   AST 36 07/21/2016 0615   ALT 46 07/21/2016 0615   ALKPHOS 64 07/21/2016 0615   BILITOT 0.5 07/21/2016 0615   GFRNONAA >60 07/21/2016 0615   GFRAA >60 07/21/2016 0615   Lab Results    Component Value Date   CHOL 146 07/17/2016   HDL 40 (L) 07/17/2016   LDLCALC 86 07/17/2016   TRIG 100 07/17/2016   CHOLHDL 3.7 07/17/2016   Lab Results  Component Value Date   HGBA1C 5.4 07/16/2016   No results found for: [id number] Lab Results  Component Value Date   TSH 1.142 07/15/2016    ***  ASSESSMENT AND PLAN 72 y.o. male here as a referral from Dr. Erlinda Hong for stroke follow up. Past medical history dementia, depression, hyperlipidemia, hypertension, schizophrenia symptoms with depression.  MRI of the brain showed nothing acute however a right subcortical infarct due to right ICA occlusion versus high-grade stenosis was suspected. MRI showed right cerebral white matter [place] is an abnormal appearance of the right ICA. LDL was 86, hemoglobin A1c 5.4, he was discharged into antiplatelet therapy for 3 months and then Plavix alone. No further intervention of the right ICA needed. Blood pressure goal is slightly higher than 130 - 150 due to right ICA occlusion. Cholesterol goal is less than 70 and he was started on Lipitor. I do not think he needs Lipitor 80 mg but will ask that he follow-up with primary care about this and with ask him to take half. Other stroke risk factors include advanced age and obesity. He was treated in the hospital for community-acquired pneumonia as well as rhabdomyolysis due to fall and being found on the ground.   Dennie Bible, [facility], [facility], APRN  [facility] [address], [place], [place] [address] [phone]

## 2017-06-09 ENCOUNTER — Ambulatory Visit: Payer: Medicare Other | Admitting: Nurse Practitioner

## 2017-06-13 ENCOUNTER — Encounter (INDEPENDENT_AMBULATORY_CARE_PROVIDER_SITE_OTHER): Payer: Self-pay

## 2017-06-13 ENCOUNTER — Encounter: Payer: Self-pay | Admitting: Adult Health

## 2017-06-13 ENCOUNTER — Ambulatory Visit: Payer: Medicare HMO | Admitting: Adult Health

## 2017-06-13 VITALS — BP 157/74 | HR 80 | Wt 230.6 lb

## 2017-06-13 DIAGNOSIS — E785 Hyperlipidemia, unspecified: Secondary | ICD-10-CM | POA: Diagnosis not present

## 2017-06-13 DIAGNOSIS — I63231 Cerebral infarction due to unspecified occlusion or stenosis of right carotid arteries: Secondary | ICD-10-CM

## 2017-06-13 DIAGNOSIS — I10 Essential (primary) hypertension: Secondary | ICD-10-CM | POA: Diagnosis not present

## 2017-06-13 NOTE — Progress Notes (Signed)
[facility]    Referring Provider: [facility] [place] Primary Care Physician:  Colonel Bald, MD  CC:  Stroke follow up  HPI: HISTORY 09/21/16 (AA):  Ronald [name] is a 72 y.o. male here as a referral from Dr. Domingo Madeira for stroke follow up. Past medical history dementia, depression, hyperlipidemia, hypertension, schizophrenia symptoms with depression. Per a review of records: Patient was seen in the emergency room on 07/16/2016 with cough and fever and weakness on his left side with a fall. Weakness started about 3 weeks previous. The morning he was seen he fell asleep in his chair and fell out of his chair to the ground landing on his left side and he has had pain in his left leg since then and difficulty moving the leg due to pain. Family said patient complained of left arm and left leg pain after falling and not previously. Chest x-ray revealed lower lobe pneumonia and he was admitted. MRI of the brain showed no evidence of acute ischemia but did show some chronic right hemisphere ischemic change. Patient is here with brother who also provides information. Patient says he fell on the ground and hit his left arm and feels better. He feels improved. No left arm numbness. He feels his left arm is better and his left side is better.  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 Seroquel angiography showed right ICA occlusion or clinoid.  UPDATE 06/13/17: Patient is being seen today for follow-up appointment from 09/21/2016 after having a right subcortical infarct with right ICA occlusion on 07/16/2016.  Patient was  medically managed for this and currently continues to take Plavix without increased bleeding or bruising.  Continues to take Lipitor 20 mg without side effects of myalgias.  Blood pressure at today's visit mildly elevated at 157/79 but patient states SBP typically 130s.  Has been ambulating with four-point cane and denies recent falls.  Continues to live with brother and girlfriend and has been going well.  Two pressure ulcers are present on left greater trochanter which are scabbed over and approximately 3 cm and 1 cm in size.  Patient states he has been putting Vaseline on them and will be seeing his PCP April 4 for her to assess these. No redness, warmth, or purulent drainage present. He does complain of excessively dry skin for which he has also been putting Vaseline on.  Denies new or worsening stroke/TIA symptoms.   Review of Systems: Patient complains of symptoms per HPI as well as the following symptoms: Runny nose, blurred vision, cough, restless leg, environmental allergies, back pain, muscle spasms, "ashy" skin.  Pertinent negatives and positives per HPI. All others negative.   Social History   Socioeconomic History  . Marital status: Single    Spouse name: Not on file  . Number of children: 0  . Years of education: Not on file  . Highest education level: Not on file  Social Needs  . Financial resource strain: Not on file  . Food insecurity - worry: Not on file  . Food insecurity - inability: Not on file  . Transportation needs - medical: Not on file  . Transportation needs - non-medical:  Not on file  Occupational History  . Not on file  Tobacco Use  . Smoking status: Never Smoker  . Smokeless tobacco: Never Used  Substance and Sexual Activity  . Alcohol use: No  . Drug use: No  . Sexual activity: No  Other Topics Concern  . Not on file  Social History Narrative   Lives at home w/ brother and his wife   Right-handed   Caffeine: occasional coffee or green tea    Family  History  Problem Relation Age of Onset  . Hypertension Father   . Hyperlipidemia Father   . Congestive Heart Failure Father 54       Deceased-1995  . Diabetes Mother   . Kidney failure Mother 56       Deceased-2005  . Hypertension Mother   . Congestive Heart Failure Maternal Grandmother   . Heart failure Paternal Grandmother   . Heart attack Paternal Grandmother     Past Medical History:  Diagnosis Date  . Cancer [facility])    Prostate  . Chicken pox   . Dementia   . Depression   . Heart disease   . Hyperlipemia   . Hypertension   . Measles   . Mumps   . 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 Stroke [facility])     Past Surgical History:  Procedure Laterality Date  . IR ANGIO EXTERNAL CAROTID SEL EXT CAROTID UNI R MOD SED  07/20/2016  . IR ANGIO INTRA EXTRACRAN SEL INTERNAL CAROTID BILAT MOD SED  07/20/2016  . IR ANGIO VERTEBRAL SEL SUBCLAVIAN INNOMINATE UNI L MOD SED  07/20/2016  . IR ANGIOGRAM EXTREMITY RIGHT  07/20/2016    Current Outpatient Medications  Medication Sig Dispense Refill  . ARIPiprazole (ABILIFY) 10 MG tablet TAKE 1 TABLET BY MOUTH EVERY DAY 90 tablet 1  . atorvastatin (LIPITOR) 20 MG tablet Take 1 tablet (20 mg total) by mouth daily at 6 PM. 30 tablet 6  . Calcium-Vitamin D 600-200 MG-UNIT per tablet Take 1 tablet by mouth daily.    . cloNIDine (CATAPRES) 0.1 MG tablet Take 1 tablet (0.1 mg total) by mouth 3 (three) times daily. 60 tablet 11  . clopidogrel (PLAVIX) 75 MG tablet Take 1 tablet (75 mg total) by mouth daily. 30 tablet 0  . finasteride (PROSCAR) 5 MG tablet Take 1 tablet (5 mg total) by mouth daily. 30 tablet 0  . lisinopril (PRINIVIL,ZESTRIL) 30 MG tablet TAKE 1 TABLET (30 MG TOTAL) BY MOUTH DAILY. 30 tablet 0  . metoprolol (LOPRESSOR) 50 MG tablet TAKE 1 TABLET BY MOUTH TWICE A DAY 60 tablet 0   No current facility-administered medications for this visit.     Allergies as of 06/13/2017 - Review Complete 06/13/2017  Allergen  Reaction Noted  . Dust mite extract Other (See Comments) 10/21/2015  . Pollen extract Other (See Comments) 10/21/2015  . Rye grass flower pollen extract [gramineae pollens] Other (See Comments) 10/21/2015    Vitals: BP (!) 157/74 (BP Location: Right Arm, Patient Position: Sitting, Cuff Size: Normal)   Pulse 80   Wt 230 lb 9.6 oz (104.6 kg)   BMI 36.66 kg/m  Last Weight:  Wt Readings from Last 1 Encounters:  06/13/17 230 lb 9.6 oz (104.6 kg)   Last Height:   Ht Readings from Last 1 Encounters:  09/21/16 5' 6.5" (1.689 m)   Physical exam: Exam: Gen: NAD, elderly African-American male, conversant  CV: RRR, no MRG. No Carotid Bruits. No peripheral edema, warm, nontender Eyes: Conjunctivae clear without exudates or hemorrhage Skin: 2x Pressure ulcers over left greater trochanter measuring approximately 3cm and 1 cm without tunneling or deep indentation.  Scab present over both ulcers.  Neuro: Detailed Neurologic Exam  Speech:    Speech is normal, no aphasia or dysarthria, bradyphonic  Cognition:    The patient is oriented to person, month, year    recent and remote memory impaired;     language fluent;     Impaired attention, concentration,     fund of knowledge impaired  Cranial Nerves:    The pupils are equal, round, and reactive to light. Attempted fundoscopic exam could not visualize. . Visual fields are full to finger confrontation. Extraocular movements are intact with saccades. Trigeminal sensation is intact and the muscles of mastication are normal. The face is symmetric. The palate elevates in the midline. Hearing intact. Voice is normal. Shoulder shrug is normal. The tongue has normal motion without fasciculations.   Coordination:    Mild dysmetria and mild decreased fine motor with the left hand  Gait:    Shuffling and stooped with 4 point cane  Motor Observation:    Postural mild tremor and mild chin tremor. Left hand resting tremor.  Tone:     Normal muscle tone.    Posture:    Posture is stooped    Strength: 5/5 strength in all 4 extremities         Sensation: intact to LT     Reflex Exam:  DTR's:    Absent AJs. Deep tendon reflexes in the upper and lower extremities are 2+ on left .   Toes:    The toes are equivocal bilaterally.   Clonus:    Clonus is absent.       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  Occlusion was medically managed with aspirin and Plavix for 3 months and then continued Plavix alone.  Vascular risk factors include hypertension and hyperlipidemia.  No residual deficits noted and patient overall doing well.  No new concerns or questions at today's appointment.   -Continue clopidogrel 75 mg daily  and lipitor 20mg   for secondary stroke prevention -Follow up with your primary care doctor regarding your cholesterol and blood pressure.  -Have PCP make recommendations regarding your pressure ulcer but in the meantime apply Neosporin/Polysporin. Keep wound clean and keep pressure off area -Use cetaphil cream for dry skin  -Monitor blood pressure at home -Maintain strict control of hypertension with blood pressure goal below 130/90, diabetes with hemoglobin A1c goal below 6.5% and cholesterol with LDL cholesterol (bad cholesterol) goal below 70 mg/dL. I also advised the patient to eat a healthy diet with plenty of whole grains, cereals, fruits and vegetables, exercise regularly and maintain ideal body weight.  Followup in the future with me in 6 months or call earlier if needed  Greater than 50% time during this 25 minute consultation visit was spent on counseling and coordination of care about HLD and HTN (risk factors), discussion about risk benefit of anticoagulation and answering questions.    Venancio Poisson, AGNP-BC  [facility] [address] [place], [place]  [address]  Phone [phone] Fax [phone]

## 2017-06-13 NOTE — Patient Instructions (Addendum)
Continue clopidogrel 75 mg daily  and lipitor 20mg   for secondary stroke prevention  Follow up with your primary care doctor regarding your cholesterol and blood pressure.  Have PCP make recommendations regarding your wound   Use cetaphil for dry skin and place neosporin on your wound until you can see your primary care doctor. Keep wound clean and keep pressure off area  Check blood pressure on a daily basis for the next 2 weeks and if the top number is below 130, you can start checking every 2 weeks    Maintain strict control of hypertension with blood pressure goal below 130/90, diabetes with hemoglobin A1c goal below 6.5% and cholesterol with LDL cholesterol (bad cholesterol) goal below 70 mg/dL. I also advised the patient to eat a healthy diet with plenty of whole grains, cereals, fruits and vegetables, exercise regularly and maintain ideal body weight.  Followup in the future with me in 6 months or call earlier if needed

## 2017-07-12 NOTE — Progress Notes (Signed)
Personally  participated in, made any corrections needed, and agree with history, physical, neuro exam,assessment and plan as stated above.    Antonia Ahern, MD [facility] 

## 2017-12-14 ENCOUNTER — Ambulatory Visit: Payer: Medicare HMO | Admitting: Adult Health

## 2017-12-14 NOTE — Progress Notes (Deleted)
[facility]    Referring Provider: Colonel Bald, MD Primary Care Physician:  Colonel Bald, MD  CC:  Stroke follow up  HPI: HISTORY 09/21/16 (AA):  Ronald [name] is a 72 y.o. male here as a referral from Dr. Ellouise Newer for stroke follow up. Past medical history dementia, depression, hyperlipidemia, hypertension, schizophrenia symptoms with depression. Per a review of records: Patient was seen in the emergency room on 07/16/2016 with cough and fever and weakness on his left side with a fall. Weakness started about 3 weeks previous. The morning he was seen he fell asleep in his chair and fell out of his chair to the ground landing on his left side and he has had pain in his left leg since then and difficulty moving the leg due to pain. Family said patient complained of left arm and left leg pain after falling and not previously. Chest x-ray revealed lower lobe pneumonia and he was admitted. MRI of the brain showed no evidence of acute ischemia but did show some chronic right hemisphere ischemic change. Patient is here with brother who also provides information. Patient says he fell on the ground and hit his left arm and feels better. He feels improved. No left arm numbness. He feels his left arm is better and his left side is better.  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 Seroquel angiography showed right ICA occlusion or clinoid.  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  Patient  was medically managed for this and currently continues to take Plavix without increased bleeding or bruising.  Continues to take Lipitor 20 mg without side effects of myalgias.  Blood pressure at today's visit mildly elevated at 157/79 but patient states SBP typically 130s.  Has been ambulating with four-point cane and denies recent falls.  Continues to live with brother and girlfriend and has been going well.  Two pressure ulcers are present on left greater trochanter which are scabbed over and approximately 3 cm and 1 cm in size.  Patient states he has been putting Vaseline on them and will be seeing his PCP April 4 for her to assess these. No redness, warmth, or purulent drainage present. He does complain of excessively dry skin for which he has also been putting Vaseline on.  Denies new or worsening stroke/TIA symptoms.  Interval history 12/15/2017:   Review of Systems: Patient complains of symptoms per HPI as well as the following symptoms: Runny nose, blurred vision, cough, restless leg, environmental allergies, back pain, muscle spasms, "ashy" skin.  Pertinent negatives and positives per HPI. All others negative.   Social History   Socioeconomic History  . Marital status: Single    Spouse name: Not on file  . Number of children: 0  . Years of education: Not on file  . Highest education level: Not on file  Occupational History  . Not on file  Social Needs  . Financial resource strain: Not on file  . Food insecurity:    Worry: Not on file    Inability: Not on file  .  Transportation needs:    Medical: Not on file    Non-medical: Not on file  Tobacco Use  . Smoking status: Never Smoker  . Smokeless tobacco: Never Used  Substance and Sexual Activity  . Alcohol use: No  . Drug use: No  . Sexual activity: Never  Lifestyle  . Physical activity:    Days per week: Not on file    Minutes per session: Not on file  . Stress: Not on file  Relationships  . Social connections:    Talks  on phone: Not on file    Gets together: Not on file    Attends religious service: Not on file    Active member of club or organization: Not on file    Attends meetings of clubs or organizations: Not on file    Relationship status: Not on file  . Intimate partner violence:    Fear of current or ex partner: Not on file    Emotionally abused: Not on file    Physically abused: Not on file    Forced sexual activity: Not on file  Other Topics Concern  . Not on file  Social History Narrative   Lives at home w/ brother and his wife   Right-handed   Caffeine: occasional coffee or green tea    Family History  Problem Relation Age of Onset  . Hypertension Father   . Hyperlipidemia Father   . Congestive Heart Failure Father 42       Deceased-1995  . Diabetes Mother   . Kidney failure Mother 10       Deceased-2005  . Hypertension Mother   . Congestive Heart Failure Maternal Grandmother   . Heart failure Paternal Grandmother   . Heart attack Paternal Grandmother     Past Medical History:  Diagnosis Date  . Cancer [facility])    Prostate  . Chicken pox   . Dementia   . Depression   . Heart disease   . Hyperlipemia   . Hypertension   . Measles   . Mumps   . 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 Stroke [facility])     Past Surgical History:  Procedure Laterality Date  . IR ANGIO EXTERNAL CAROTID SEL EXT CAROTID UNI R MOD SED  07/20/2016  . IR ANGIO INTRA EXTRACRAN SEL INTERNAL CAROTID BILAT MOD SED  07/20/2016  . IR ANGIO VERTEBRAL SEL SUBCLAVIAN INNOMINATE UNI L MOD SED  07/20/2016  . IR ANGIOGRAM EXTREMITY RIGHT  07/20/2016    Current Outpatient Medications  Medication Sig Dispense Refill  . ARIPiprazole (ABILIFY) 10 MG tablet TAKE 1 TABLET BY MOUTH EVERY DAY 90 tablet 1  . atorvastatin (LIPITOR) 20 MG tablet Take 1 tablet (20 mg total) by mouth daily at 6 PM. 30 tablet 6  . Calcium-Vitamin D 600-200 MG-UNIT per tablet Take 1 tablet by mouth daily.    . cloNIDine  (CATAPRES) 0.1 MG tablet Take 1 tablet (0.1 mg total) by mouth 3 (three) times daily. 60 tablet 11  . clopidogrel (PLAVIX) 75 MG tablet Take 1 tablet (75 mg total) by mouth daily. 30 tablet 0  . finasteride (PROSCAR) 5 MG tablet Take 1 tablet (5 mg total) by mouth daily. 30 tablet 0  . lisinopril (PRINIVIL,ZESTRIL) 30 MG tablet TAKE 1 TABLET (30 MG TOTAL) BY MOUTH DAILY. 30 tablet 0  . metoprolol (LOPRESSOR) 50 MG tablet TAKE 1 TABLET BY MOUTH TWICE A DAY 60 tablet 0   No current facility-administered medications for  this visit.     Allergies as of 12/14/2017 - Review Complete 06/13/2017  Allergen Reaction Noted  . Dust mite extract Other (See Comments) 10/21/2015  . Pollen extract Other (See Comments) 10/21/2015  . Rye grass flower pollen extract [gramineae pollens] Other (See Comments) 10/21/2015    Vitals: There were no vitals taken for this visit. Last Weight:  Wt Readings from Last 1 Encounters:  06/13/17 230 lb 9.6 oz (104.6 kg)   Last Height:   Ht Readings from Last 1 Encounters:  09/21/16 5' 6.5" (1.689 m)   Physical exam: Exam: Gen: NAD, elderly African-American male, conversant                CV: RRR, no MRG. No Carotid Bruits. No peripheral edema, warm, nontender Eyes: Conjunctivae clear without exudates or hemorrhage Skin: 2x Pressure ulcers over left greater trochanter measuring approximately 3cm and 1 cm without tunneling or deep indentation.  Scab present over both ulcers.  Neuro: Detailed Neurologic Exam  Speech:    Speech is normal, no aphasia or dysarthria, bradyphonic  Cognition:    The patient is oriented to person, month, year    recent and remote memory impaired;     language fluent;     Impaired attention, concentration,     fund of knowledge impaired  Cranial Nerves:    The pupils are equal, round, and reactive to light. Attempted fundoscopic exam could not visualize. . Visual fields are full to finger confrontation. Extraocular movements are  intact with saccades. Trigeminal sensation is intact and the muscles of mastication are normal. The face is symmetric. The palate elevates in the midline. Hearing intact. Voice is normal. Shoulder shrug is normal. The tongue has normal motion without fasciculations.   Coordination:    Mild dysmetria and mild decreased fine motor with the left hand  Gait:    Shuffling and stooped with 4 point cane  Motor Observation:    Postural mild tremor and mild chin tremor. Left hand resting tremor.  Tone:    Normal muscle tone.    Posture:    Posture is stooped    Strength: 5/5 strength in all 4 extremities         Sensation: intact to LT     Reflex Exam:  DTR's:    Absent AJs. Deep tendon reflexes in the upper and lower extremities are 2+ on left .   Toes:    The toes are equivocal bilaterally.   Clonus:    Clonus is absent.       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  Occlusion was medically managed with aspirin and Plavix for 3 months and then continued Plavix alone.  Vascular risk factors include hypertension and hyperlipidemia.  No residual deficits noted and patient overall doing well.  No new concerns or questions at today's appointment.   -Continue clopidogrel 75 mg daily  and lipitor 20mg   for secondary stroke prevention -Follow up with your primary care doctor regarding your cholesterol and blood pressure.  -Have PCP make recommendations regarding your pressure ulcer but in the meantime apply Neosporin/Polysporin. Keep wound clean and keep pressure off area -Use cetaphil cream for dry skin  -Monitor blood pressure at home -Maintain strict control of hypertension with blood pressure goal below 130/90, diabetes with hemoglobin A1c goal below 6.5% and cholesterol with LDL cholesterol (bad cholesterol) goal below 70 mg/dL. I also advised the patient to  eat a healthy diet with  plenty of whole grains, cereals, fruits and vegetables, exercise regularly and maintain ideal body weight.  Followup in the future with me in 6 months or call earlier if needed  Greater than 50% time during this 25 minute consultation visit was spent on counseling and coordination of care about HLD and HTN (risk factors), discussion about risk benefit of anticoagulation and answering questions.    Venancio Poisson, AGNP-BC  [facility] [address] [place], [place] [address]  Phone [phone] Fax [phone]

## 2017-12-15 ENCOUNTER — Telehealth: Payer: Self-pay

## 2017-12-15 ENCOUNTER — Encounter: Payer: Self-pay | Admitting: Adult Health

## 2017-12-15 NOTE — Telephone Encounter (Signed)
Pt no show for appt on 12/14/2017. 

## 2018-03-18 IMAGING — US IR CAROTID EXTERNAL UNILAT RIGHT (MS)
1 series · 2 of 2 positions shown · non-contrast
Comparison: none

INDICATION: 71-year-old male with a history of left-sided weakness and evidence
of right-sided clinical stroke.
TECHNIQUE: Informed written consent was obtained from the patient after a
thorough discussion of the procedural risks, benefits and
alternatives. All questions were addressed. Maximal Sterile Barrier
Technique was utilized including caps, mask, sterile gowns, sterile
gloves, sterile drape, hand hygiene and skin antiseptic. A timeout
was performed prior to the initiation of the procedure.

[Series 1: ir (id) (id) · 2 of 2 slices shown]
[im 1/2]
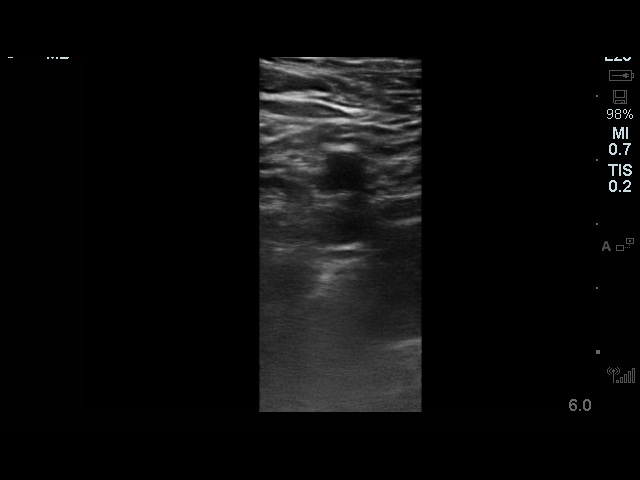
[im 2/2]
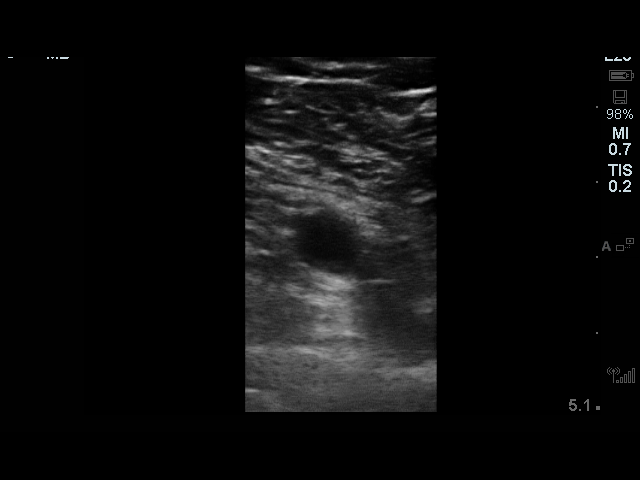

[2 of 2 positions shown; findings below may reference images not displayed]

Prior MRI demonstrates diffusion restriction in a watershed
distribution of the right hemisphere.

EXAM:
BILATERAL COMMON CAROTID AND INNOMINATE ANGIOGRAPHY AND BILATERAL
VERTEBRAL ARTERY ANGIOGRAMS

MEDICATIONS:
None

ANESTHESIA/SEDATION:
Versed 1.0 mg IV; Fentanyl 25 mcg IV

Moderate Sedation Time:  55

The patient was continuously monitored during the procedure by the
interventional radiology nurse under my direct supervision.

FLUOROSCOPY TIME:  Fluoroscopy Time: 12 minutes 30 seconds 0174
mGy).

COMPLICATIONS:
None
PROCEDURE:
The right inguinal region was prepped and draped in the usual
sterile fashion.

1% lidocaine was used for local anesthesia.

Ultrasound survey of the right inguinal region was performed with
images stored and sent to PACs.

A micropuncture needle was used access the right common femoral
artery under ultrasound. With excellent arterial blood flow
returned, and an .018 micro wire was passed through the needle,
observed enter the abdominal aorta under fluoroscopy. The needle was
removed, and a micropuncture sheath was placed over the wire. The
inner dilator and wire were removed, and an 035 Bentson wire was
advanced under fluoroscopy into the abdominal aorta. The sheath was
removed and a standard 5 French vascular sheath was placed. The
dilator was removed and the sheath was flushed.

A 5 French JB1 catheter was advanced to the aortic arch region, wire
was removed, and double flush was achieved.

Catheter was then positioned into the origin of the innominate
artery, with roadmap angiogram performed. Catheter was advanced into
the subclavian artery over 035 roadrunner wire. Wire was removed and
the catheter was withdrawn to the origin of the vertebral artery.
Non selective injection of the subclavian artery was performed, with
images acquired at the neck and intracranial.

Catheter was then withdrawn, double flush was achieved, and the
catheter was advanced over the wire into the common carotid artery.

Angiogram of the cervical and of the intracranial segments was
performed.

Independent sub selection of both the external carotid artery and
internal carotid artery were performed with cervical and cranial
segment angiogram performed.

Catheter was then withdrawn to the aortic arch, with double flush
performed. Left common carotid artery was selected, and the catheter
was advanced over the wire.

Left common carotid artery angiogram was performed.

Independent sub selection of the internal carotid artery was then
performed, with angiogram of the cervical and cranial segments.

Catheter was withdrawn to the aortic arch with double flush. Left
subclavian artery was then selected, with non selective injection of
the left vertebral artery origin.

Angiogram of the cervical and intracranial segments performed.

Catheter was withdrawn from the sheath.

Angiogram of the right common femoral artery sheath performed.

Exoseal closure was performed.

Patient tolerated the procedure well and remained hemodynamically
stable throughout.

No complications were encountered and no significant blood loss.
FINDINGS: Innominate artery: Tortuous innominate artery with no significant
atherosclerotic plaque. Subclavian artery remains patent.

Right vertebral artery: There is early occlusion of the right
vertebral artery origin with no significant filling of the cervical
segment. There is patency of the ascending cervical artery from the
thyrocervical trunk, with demonstrates subtle collateral filling of
the distal cervical segment of the right vertebral artery as well as
the intracranial segment to the vertebrobasilar junction.

No significant contribution from the deep cervical artery at this
time.

Right common carotid artery:  Normal course caliber and contour.

Right external carotid artery: Patent with antegrade flow. Selective
injection demonstrates no significant contribution to the right
hemisphere.

Right internal carotid artery: Selective injection of the right
internal carotid artery demonstrates uniform small caliber cervical
segment from the bifurcation to the skullbase. No dissection flap or
significant stenosis. No irregularity of the lumen to suggest
thrombus.

The vertical and horizontal petrous segment are patent. Last all
segment patent. Cavernous segment patent. The intracranial ICA is
patent to the level of the clinoid and terminates at the ophthalmic
artery which demonstrates antegrade flow. Beyond this, the artery is
occluded with no flow. There is questionable development of Vasa
those or Um beyond the occlusion, with no significant contribution
to the M1 segment.

No meniscus or tapered lumen.

Right MCA: No filling of the MCA via the right ICA injection. Right
MCA fills from a left-to-right flow across patent anterior
communicating artery. There appears to be a irregular stenosis of
less than 50% narrowing of the distal M1 segment, corresponding to
findings on prior CT scan.

Right ACA: No filling of the right ACA via the right IC injection.
Right-sided ACA fills via the left or right cross flow, with
slightly delayed perfusion through the late arterial and capillary
phase compared to the left.

Left common carotid artery:  Normal course caliber and contour.

Left external carotid artery: Patent with antegrade flow.

Left internal carotid artery: Normal course caliber and contour of
the cervical segment of the left ICA. Caliber is uniform, larger
than the right. Left intracranial ICA patent. Antegrade flow of the
left ophthalmic artery. Adequate flow through to the ICA terminus
with filling of the M1 and A1 segments.

Left MCA: M1 segment patent. Insular and opercular segments patent.
Unremarkable caliber and course of the cortical segments. Typical
arterial, capillary/ parenchymal, and venous phase.

Left ACA: A1 segment patent. Anterior communicating artery is patent
with filling of the right MCA from cross flow. Right a 2 segment
filling the right ACA territory. Perfusion slightly delayed compared
to the left hemisphere.

Left vertebral artery:

Unremarkable course caliber and contour of the left vertebral artery
from the subclavian origin to the skullbase, with no significant
stenosis, dissection flap, or regularity.

Injection demonstrates reflux into the right distal vertebral artery
at the vertebrobasilar junction, demonstrating small caliber distal
right vertebral artery at the junction.

Retrograde flow through patent right-sided posterior communicating
artery contributes to perfusion of the right MCA territory.

Bilateral posterior communicating arteries patent.

Tortuous basilar artery with patency of the bilateral posterior
cerebral arteries maintained. Right-sided posterior inferior
cerebellar artery not visualized. Unremarkable appearance of the
right-sided anterior inferior cerebellar artery.

Unremarkable appearance of the left posterior inferior cerebellar
artery. Small caliber superior cerebellar arteries.
IMPRESSION: Status post cervical and cerebral angiogram demonstrating relatively
small caliber cervical segment of the right ICA, which is uniform
from the bifurcation to the ophthalmic segment. Occlusion just
beyond the origin of the ophthalmic artery, with no contribution to
the right MCA.

Occlusion of the nondominant right vertebral artery just beyond the
origin, with collateral flow developing of the distal cervical
segment via the ascending cervical artery.

Perfusion of the right MCA from cross filling of the left
intracranial ICA via patent anterior communicating artery, as well
as retrograde flow through patent right posterior communicating
artery.

Questionable atherosclerotic changes in the distal right M1 segment,
with less than 50% stenosis.

Dominant left vertebral artery perfuses bilateral PCA.

These results were called by telephone at the time of interpretation
on 07/20/2016 at [DATE] to Dr. BURQNA DIONISSIEVA , who verbally
acknowledged these results.

## 2018-05-23 ENCOUNTER — Encounter (HOSPITAL_COMMUNITY): Payer: Self-pay

## 2018-05-23 ENCOUNTER — Other Ambulatory Visit: Payer: Self-pay

## 2018-05-23 ENCOUNTER — Inpatient Hospital Stay (HOSPITAL_COMMUNITY): Payer: Medicare HMO

## 2018-05-23 ENCOUNTER — Inpatient Hospital Stay (HOSPITAL_COMMUNITY)
Admission: EM | Admit: 2018-05-23 | Discharge: 2018-05-26 | DRG: 069 | Disposition: A | Discharge status: Home Health | Payer: Medicare HMO | Attending: Neurology | Admitting: Neurology

## 2018-05-23 ENCOUNTER — Emergency Department (HOSPITAL_COMMUNITY): Payer: Medicare HMO

## 2018-05-23 DIAGNOSIS — R0902 Hypoxemia: Secondary | ICD-10-CM | POA: Diagnosis present

## 2018-05-23 DIAGNOSIS — C61 Malignant neoplasm of prostate: Secondary | ICD-10-CM | POA: Diagnosis present

## 2018-05-23 DIAGNOSIS — F0390 Unspecified dementia without behavioral disturbance: Secondary | ICD-10-CM | POA: Diagnosis present

## 2018-05-23 DIAGNOSIS — Z833 Family history of diabetes mellitus: Secondary | ICD-10-CM | POA: Diagnosis not present

## 2018-05-23 DIAGNOSIS — F319 Bipolar disorder, unspecified: Secondary | ICD-10-CM | POA: Diagnosis present

## 2018-05-23 DIAGNOSIS — E162 Hypoglycemia, unspecified: Secondary | ICD-10-CM | POA: Diagnosis present

## 2018-05-23 DIAGNOSIS — Z8249 Family history of ischemic heart disease and other diseases of the circulatory system: Secondary | ICD-10-CM

## 2018-05-23 DIAGNOSIS — I10 Essential (primary) hypertension: Secondary | ICD-10-CM | POA: Diagnosis present

## 2018-05-23 DIAGNOSIS — R29703 NIHSS score 3: Secondary | ICD-10-CM | POA: Diagnosis present

## 2018-05-23 DIAGNOSIS — R739 Hyperglycemia, unspecified: Secondary | ICD-10-CM | POA: Diagnosis present

## 2018-05-23 DIAGNOSIS — N179 Acute kidney failure, unspecified: Secondary | ICD-10-CM | POA: Diagnosis present

## 2018-05-23 DIAGNOSIS — R32 Unspecified urinary incontinence: Secondary | ICD-10-CM | POA: Diagnosis present

## 2018-05-23 DIAGNOSIS — I959 Hypotension, unspecified: Secondary | ICD-10-CM | POA: Diagnosis present

## 2018-05-23 DIAGNOSIS — G459 Transient cerebral ischemic attack, unspecified: Secondary | ICD-10-CM | POA: Diagnosis present

## 2018-05-23 DIAGNOSIS — E871 Hypo-osmolality and hyponatremia: Secondary | ICD-10-CM | POA: Diagnosis present

## 2018-05-23 DIAGNOSIS — Z8673 Personal history of transient ischemic attack (TIA), and cerebral infarction without residual deficits: Secondary | ICD-10-CM

## 2018-05-23 DIAGNOSIS — R4182 Altered mental status, unspecified: Secondary | ICD-10-CM | POA: Diagnosis not present

## 2018-05-23 DIAGNOSIS — Z6832 Body mass index (BMI) 32.0-32.9, adult: Secondary | ICD-10-CM | POA: Diagnosis not present

## 2018-05-23 DIAGNOSIS — R569 Unspecified convulsions: Secondary | ICD-10-CM | POA: Diagnosis present

## 2018-05-23 DIAGNOSIS — I69354 Hemiplegia and hemiparesis following cerebral infarction affecting left non-dominant side: Secondary | ICD-10-CM | POA: Diagnosis not present

## 2018-05-23 DIAGNOSIS — E875 Hyperkalemia: Secondary | ICD-10-CM | POA: Diagnosis present

## 2018-05-23 DIAGNOSIS — F329 Major depressive disorder, single episode, unspecified: Secondary | ICD-10-CM | POA: Diagnosis present

## 2018-05-23 DIAGNOSIS — Z7902 Long term (current) use of antithrombotics/antiplatelets: Secondary | ICD-10-CM | POA: Diagnosis not present

## 2018-05-23 DIAGNOSIS — D649 Anemia, unspecified: Secondary | ICD-10-CM | POA: Diagnosis present

## 2018-05-23 DIAGNOSIS — E785 Hyperlipidemia, unspecified: Secondary | ICD-10-CM | POA: Diagnosis present

## 2018-05-23 DIAGNOSIS — I639 Cerebral infarction, unspecified: Secondary | ICD-10-CM

## 2018-05-23 DIAGNOSIS — I6521 Occlusion and stenosis of right carotid artery: Secondary | ICD-10-CM

## 2018-05-23 DIAGNOSIS — G9389 Other specified disorders of brain: Secondary | ICD-10-CM | POA: Diagnosis not present

## 2018-05-23 DIAGNOSIS — E669 Obesity, unspecified: Secondary | ICD-10-CM | POA: Diagnosis present

## 2018-05-23 DIAGNOSIS — Z79899 Other long term (current) drug therapy: Secondary | ICD-10-CM | POA: Diagnosis not present

## 2018-05-23 DIAGNOSIS — Z8349 Family history of other endocrine, nutritional and metabolic diseases: Secondary | ICD-10-CM

## 2018-05-23 DIAGNOSIS — Z841 Family history of disorders of kidney and ureter: Secondary | ICD-10-CM

## 2018-05-23 LAB — DIFFERENTIAL
Abs Immature Granulocytes: 0.03 10*3/uL (ref 0.00–0.07)
BASOS ABS: 0 10*3/uL (ref 0.0–0.1)
BASOS PCT: 0 %
EOS ABS: 0 10*3/uL (ref 0.0–0.5)
EOS PCT: 0 %
Immature Granulocytes: 0 %
Lymphocytes Relative: 26 %
Lymphs Abs: 1.7 10*3/uL (ref 0.7–4.0)
MONO ABS: 0.5 10*3/uL (ref 0.1–1.0)
MONOS PCT: 8 %
NEUTROS PCT: 66 %
Neutro Abs: 4.4 10*3/uL (ref 1.7–7.7)

## 2018-05-23 LAB — I-STAT CREATININE, ED: CREATININE: 1.6 mg/dL — AB (ref 0.61–1.24)

## 2018-05-23 LAB — I-STAT TROPONIN, ED: TROPONIN I, POC: 0.01 ng/mL (ref 0.00–0.08)

## 2018-05-23 LAB — INFLUENZA PANEL BY PCR (TYPE A & B)
Influenza A By PCR: NEGATIVE
Influenza B By PCR: NEGATIVE

## 2018-05-23 LAB — CBC
HCT: 36.8 % — ABNORMAL LOW (ref 39.0–52.0)
Hemoglobin: 11 g/dL — ABNORMAL LOW (ref 13.0–17.0)
MCH: 27.5 pg (ref 26.0–34.0)
MCHC: 29.9 g/dL — AB (ref 30.0–36.0)
MCV: 92 fL (ref 80.0–100.0)
PLATELETS: 289 10*3/uL (ref 150–400)
RBC: 4 MIL/uL — ABNORMAL LOW (ref 4.22–5.81)
RDW: 13.8 % (ref 11.5–15.5)
WBC: 6.7 10*3/uL (ref 4.0–10.5)
nRBC: 0 % (ref 0.0–0.2)

## 2018-05-23 LAB — COMPREHENSIVE METABOLIC PANEL
ALT: 26 U/L (ref 0–44)
ANION GAP: 12 (ref 5–15)
AST: 42 U/L — ABNORMAL HIGH (ref 15–41)
Albumin: 3.4 g/dL — ABNORMAL LOW (ref 3.5–5.0)
Alkaline Phosphatase: 86 U/L (ref 38–126)
BUN: 33 mg/dL — ABNORMAL HIGH (ref 8–23)
CHLORIDE: 103 mmol/L (ref 98–111)
CO2: 17 mmol/L — ABNORMAL LOW (ref 22–32)
CREATININE: 1.51 mg/dL — AB (ref 0.61–1.24)
Calcium: 9 mg/dL (ref 8.9–10.3)
GFR calc Af Amer: 53 mL/min — ABNORMAL LOW (ref >60)
GFR, EST NON AFRICAN AMERICAN: 46 mL/min — AB (ref >60)
Glucose, Bld: 155 mg/dL — ABNORMAL HIGH (ref 70–99)
POTASSIUM: 5.5 mmol/L — AB (ref 3.5–5.1)
SODIUM: 132 mmol/L — AB (ref 135–145)
Total Bilirubin: 1.7 mg/dL — ABNORMAL HIGH (ref 0.3–1.2)
Total Protein: 6 g/dL — ABNORMAL LOW (ref 6.5–8.1)

## 2018-05-23 LAB — PROTIME-INR
INR: 1.2 (ref 0.8–1.2)
Prothrombin Time: 14.6 seconds (ref 11.4–15.2)

## 2018-05-23 LAB — URINALYSIS, ROUTINE W REFLEX MICROSCOPIC
Bilirubin Urine: NEGATIVE
GLUCOSE, UA: NEGATIVE mg/dL
Hgb urine dipstick: NEGATIVE
Ketones, ur: NEGATIVE mg/dL
LEUKOCYTE UA: NEGATIVE
NITRITE: NEGATIVE
Protein, ur: NEGATIVE mg/dL
Specific Gravity, Urine: 1.02 (ref 1.005–1.030)
pH: 6 (ref 5.0–8.0)

## 2018-05-23 LAB — CBG MONITORING, ED: GLUCOSE-CAPILLARY: 138 mg/dL — AB (ref 70–99)

## 2018-05-23 LAB — MRSA PCR SCREENING: MRSA by PCR: NEGATIVE

## 2018-05-23 LAB — APTT: APTT: 35 s (ref 24–36)

## 2018-05-23 LAB — LACTIC ACID, PLASMA: Lactic Acid, Venous: 1.4 mmol/L (ref 0.5–1.9)

## 2018-05-23 MED ORDER — NOREPINEPHRINE 4 MG/250ML-% IV SOLN
INTRAVENOUS | Status: AC
  Filled 2018-05-23: qty 250

## 2018-05-23 MED ORDER — SODIUM CHLORIDE 0.9 % IV SOLN
INTRAVENOUS | Status: DC
  Administered 2018-05-23 – 2018-05-24 (×2): via INTRAVENOUS

## 2018-05-23 MED ORDER — PHENYLEPHRINE HCL-NACL 10-0.9 MG/250ML-% IV SOLN
0.0000 ug/min | INTRAVENOUS | Status: DC
  Administered 2018-05-23: 20 ug/min via INTRAVENOUS
  Filled 2018-05-23 (×2): qty 250

## 2018-05-23 MED ORDER — INSULIN ASPART 100 UNIT/ML ~~LOC~~ SOLN: 0.0000 [IU] | SUBCUTANEOUS | Status: DC

## 2018-05-23 MED ORDER — PHENYLEPHRINE HCL-NACL 10-0.9 MG/250ML-% IV SOLN
25.0000 ug/min | INTRAVENOUS | Status: DC
  Administered 2018-05-23: 30 ug/min via INTRAVENOUS
  Administered 2018-05-24 (×2): 40 ug/min via INTRAVENOUS
  Filled 2018-05-23 (×2): qty 250

## 2018-05-23 MED ORDER — CLOPIDOGREL BISULFATE 75 MG PO TABS
75.0000 mg | ORAL_TABLET | Freq: Every day | ORAL | Status: DC
  Administered 2018-05-24 – 2018-05-26 (×4): 75 mg via ORAL
  Filled 2018-05-23 (×4): qty 1

## 2018-05-23 MED ORDER — SODIUM CHLORIDE 0.9 % IV BOLUS
1000.0000 mL | Freq: Once | INTRAVENOUS | Status: AC
  Administered 2018-05-23: 1000 mL via INTRAVENOUS

## 2018-05-23 MED ORDER — ACETAMINOPHEN 325 MG PO TABS: 650.0000 mg | ORAL_TABLET | ORAL | Status: DC | PRN

## 2018-05-23 MED ORDER — ACETAMINOPHEN 160 MG/5ML PO SOLN: 650.0000 mg | ORAL | Status: DC | PRN

## 2018-05-23 MED ORDER — DEXTROSE 50 % IV SOLN
INTRAVENOUS | Status: AC
  Administered 2018-05-24: 25 mL
  Filled 2018-05-23: qty 50

## 2018-05-23 MED ORDER — IOPAMIDOL (ISOVUE-370) INJECTION 76%
100.0000 mL | Freq: Once | INTRAVENOUS | Status: AC | PRN
  Administered 2018-05-23: 100 mL via INTRAVENOUS

## 2018-05-23 MED ORDER — SODIUM CHLORIDE 0.9% FLUSH
3.0000 mL | Freq: Once | INTRAVENOUS | Status: AC
  Administered 2018-05-23: 3 mL via INTRAVENOUS

## 2018-05-23 MED ORDER — SENNOSIDES-DOCUSATE SODIUM 8.6-50 MG PO TABS: 1.0000 | ORAL_TABLET | Freq: Every evening | ORAL | Status: DC | PRN

## 2018-05-23 MED ORDER — ACETAMINOPHEN 650 MG RE SUPP: 650.0000 mg | RECTAL | Status: DC | PRN

## 2018-05-23 MED ORDER — SODIUM CHLORIDE 0.9 % IV SOLN: 250.0000 mL | INTRAVENOUS | Status: DC

## 2018-05-23 MED ORDER — SODIUM CHLORIDE 0.9 % IV BOLUS
2000.0000 mL | Freq: Once | INTRAVENOUS | Status: AC
  Administered 2018-05-23: 2000 mL via INTRAVENOUS

## 2018-05-23 MED ORDER — STROKE: EARLY STAGES OF RECOVERY BOOK: Freq: Once | Status: DC

## 2018-05-23 NOTE — ED Notes (Signed)
Patient had episode of unresponsive; attempted sternal rub with no success. Gaze deviated to the left, sats 66% with good pleth, hypotension. Time length <60 seconds. Upon awaking, was talkative and oriented x3.

## 2018-05-23 NOTE — H&P (Addendum)
NEUROLOGY H&P  CC: Altered mental status  History is obtained from: EMS  HPI: Ronald [name] is a 73 y.o. 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 Patient was eating dinner with family around 5:55 PM had a sudden onset of unresponsiveness.  They did not report any seizure-like activity.  EMS was called.  When EMS arrived, he had a rightward gaze and was very lethargic and not following commands.  He needed to be bagged ventilated for a few minutes prior to starting to come around.  Going to EMS, he had some weakness on the right side which has since resolved.  He is able to provide coherent history.  Denies any history of seizures.  Denies any recent illnesses or sicknesses including fevers chills.  Denies any chest pain shortness of breath palpitations nausea vomiting abdominal pain diarrhea constipation. Uses a wheelchair at baseline. EMS reports urinary incontinence and smell of urine on him when they picked him up.  LKW: 5:55 PM on 05/23/2018. tpa given?: no, low NIHSS Premorbid modified Rankin scale (mRS): 4  ROS: ROS was performed and is negative except as noted in the HPI.   Past Medical History:  Diagnosis Date  . Cancer [facility])    Prostate  . Chicken pox   . Dementia   . Depression   . Heart disease   . Hyperlipemia   . Hypertension   . Measles   . Mumps   . 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 Stroke [facility])    Family History  Problem Relation Age of Onset  . Hypertension Father   . Hyperlipidemia Father   . Congestive Heart Failure Father 52       Deceased-1995  . Diabetes Mother   . Kidney failure Mother 17       Deceased-2005  . Hypertension Mother   . Congestive Heart Failure Maternal Grandmother   . Heart failure Paternal  Grandmother   . Heart attack Paternal Grandmother    Social History:   reports that he has never smoked. He has never used smokeless tobacco. He reports that he does not drink alcohol or use drugs.  Medications  Current Facility-Administered Medications:  .  sodium chloride flush (NS) 0.9 % injection 3 mL, 3 mL, Intravenous, Once, Varney Biles, MD  Current Outpatient Medications:  .  ARIPiprazole (ABILIFY) 10 MG tablet, TAKE 1 TABLET BY MOUTH EVERY DAY, Disp: 90 tablet, Rfl: 1 .  atorvastatin (LIPITOR) 20 MG tablet, Take 1 tablet (20 mg total) by mouth daily at 6 PM., Disp: 30 tablet, Rfl: 6 .  Calcium-Vitamin D 600-200 MG-UNIT per tablet, Take 1 tablet by mouth daily., Disp: , Rfl:  .  cloNIDine (CATAPRES) 0.1 MG tablet, Take 1 tablet (0.1 mg total) by mouth 3 (three) times daily., Disp: 60 tablet, Rfl: 11 .  clopidogrel (PLAVIX) 75 MG tablet, Take 1 tablet (75 mg total) by mouth daily., Disp: 30 tablet, Rfl: 0 .  finasteride (PROSCAR) 5 MG tablet, Take 1 tablet (5 mg total) by mouth daily., Disp: 30 tablet, Rfl: 0 .  lisinopril (PRINIVIL,ZESTRIL) 30 MG tablet, TAKE 1 TABLET (30 MG TOTAL) BY MOUTH DAILY., Disp: 30 tablet, Rfl: 0 .  metoprolol (LOPRESSOR) 50 MG tablet, TAKE 1 TABLET BY MOUTH TWICE A DAY, Disp: 60 tablet, Rfl: 0  Exam: Current  vital signs: BP (!) 105/56   Wt 96.3 kg   BMI 33.75 kg/m  Vital signs in last 24 hours: BP: (105)/(56) 105/56 (02/25 1909) Weight:  [96.3 kg] 96.3 kg (02/25 1909) 101/58 blood pressure  GENERAL: Awake, alert in NAD HEENT: - Normocephalic and atraumatic, dry mm, no LN++, no Thyromegally LUNGS - Clear to auscultation bilaterally with no wheezes CV - S1S2 RRR, no m/r/g, equal pulses bilaterally. ABDOMEN - Soft, nontender, nondistended with normoactive BS Ext: warm, well perfused, intact peripheral pulses, trace edema  NEURO:  Mental Status: AA&Ox3  Language: speech is mildly dysarthric but patient says that is baseline..  Naming,  repetition, fluency, and comprehension intact. Attention concentration is mildly reduced. Cranial Nerves: PERRL. EOMI, visual fields full, no facial asymmetry, facial sensation intact, hearing mildly reduced bilaterally, tongue/uvula/soft palate midline, normal sternocleidomastoid and trapezius muscle strength. No evidence of tongue atrophy or fibrillations Motor: No vertical drift in the upper extremities or lower extremities but left upper extremity proximal strength is 4+/5 compared to 5/5 on the right upper extremity.  4+/5 in both lower extremities. Tone: is normal and bulk is normal Sensation- Intact to light touch bilaterally -no gross extinction but seems to be possibly neglecting the left side Coordination: FTN shows possible mild right-sided dysmetria but it is not disproportionate to the weakness Gait- deferred  NIHSS 1a Level of Conscious.: 0 1b LOC Questions: 0 1c LOC Commands: 0 2 Best Gaze: 0 3 Visual: 0 4 Facial Palsy: 0 5a Motor Arm - left: 0 5b Motor Arm - Right: 0 6a Motor Leg - Left: 0 6b Motor Leg - Right: 0 7 Limb Ataxia: 1 8 Sensory: 0 9 Best Language: 0 10 Dysarthria: 1 11 Extinct. and Inatten.: 1 TOTAL: 3  Labs I have reviewed labs in epic and the results pertinent to this consultation are:  CBC    Component Value Date/Time   WBC 5.2 07/21/2016 0615   RBC 3.72 (L) 07/21/2016 0615   HGB 10.8 (L) 07/21/2016 0615   HCT 34.5 (L) 07/21/2016 0615   PLT 241 07/21/2016 0615   MCV 92.7 07/21/2016 0615   MCH 29.0 07/21/2016 0615   MCHC 31.3 07/21/2016 0615   RDW 14.5 07/21/2016 0615   LYMPHSABS 1.2 07/21/2016 0615   MONOABS 0.3 07/21/2016 0615   EOSABS 0.1 07/21/2016 0615   BASOSABS 0.0 07/21/2016 0615   CMP     Component Value Date/Time   NA 137 07/21/2016 0615   K 4.1 07/21/2016 0615   CL 102 07/21/2016 0615   CO2 27 07/21/2016 0615   GLUCOSE 109 (H) 07/21/2016 0615   BUN 13 07/21/2016 0615   CREATININE 0.87 07/21/2016 0615   CREATININE 0.99  10/15/2013 0755   CALCIUM 9.1 07/21/2016 0615   PROT 6.3 (L) 07/21/2016 0615   ALBUMIN 3.1 (L) 07/21/2016 0615   AST 36 07/21/2016 0615   ALT 46 07/21/2016 0615   ALKPHOS 64 07/21/2016 0615   BILITOT 0.5 07/21/2016 0615   GFRNONAA >60 07/21/2016 0615   GFRAA >60 07/21/2016 0615   Imaging I have reviewed the images obtained: CT-scan of the brain-evidence of old watershed right cerebral strokes-encephalomalacia in the region.  No acute changes.  Aspects 10. CTA head and neck-chronic right ICA occlusion. CTP: large penumbra in the right hemisphere, no core.  Assessment: 73 year old with above said past medical history per history of strokes and left-sided with some residual weakness, presenting with episode of altered mental status.  According to EMS he was weak on  the right but on our clinical examination here, the weakness is mild and on the left side. He had a rightward gaze, urinary incontinence-suspicion for seizure versus syncope versus stroke.Marland Kitchen He does have a chronic right ICA occlusion, so strokes cannot totally be ruled out. CT perfusion shows a large penumbra in the right hemisphere without any cord.  CT angios head and neck shows the chronic right ICA occlusion which might be slightly worse than before and might be causing his symptoms due to right cerebral hypoperfusion in the setting of hypotension.  He will benefit from further work-up and admission.  The goal of the admission would be to keep his blood pressures in the high range 409-811 systolic as well as evaluate for any causes that might of caused him to be hypotensive, including medications that she is on clonidine, lisinopril and metoprolol.  Not a candidate for TPA due to low stroke scale Not a candidate for EVT due to poor modified Rankin at baseline.  Impression: Evaluate for new stroke versus seizure from old area of encephalomalacia. Evaluate for etiologies that might lead to recrudescence of old symptoms such as  pneumonia and UTI.  Recommendations: Admit to NICU to stroke IV fluids-1 L bolus normal saline now Neo-Synephrine for maintaining blood pressure goal 140-180. No antiepileptics for now Obtain MRI of the brain Not a candidate for EVT due to baseline modified Rankin of 4 and it is unclear whether this CT perfusion change is meaningful as he is not outside the 6-hour window, but perfusion does suggest some amount of hypoperfusion permissive hypertension at this time might be the only treatment that might benefit him Routine EEG in the morning Hemoglobin A1c Fasting lipid panel 2D echocardiogram Factious work-up-UA chest x-ray Flu screen PCCM Consult for medical management of hypotension and possible aspiration pna  Hold antiepileptics for now-seizures are in the differentials but seems to be more of pressure dependent phenomenology. If concern for clinical seizures remains, consider AEDs  Stroke team will continue to follow.  -- Amie Portland, MD [facility] Pager: [phone] If 7pm to 7am, please call on call as listed on AMION.   CRITICAL CARE ATTESTATION Performed by: Amie Portland, MD Total critical care time: 45 minutes Critical care time was exclusive of separately billable procedures and treating other patients and/or supervising APPs/Residents/Students Critical care was necessary to treat or prevent imminent or life-threatening deterioration due to possible stroke and cerebral hypoperfusion.  This patient is critically ill and at significant risk for neurological worsening and/or death and care requires constant monitoring. Critical care was time spent personally by me on the following activities: development of treatment plan with patient and/or surrogate as well as nursing, discussions with consultants, evaluation of patient's response to treatment, examination of patient, obtaining history from patient or surrogate, ordering and performing treatments and  interventions, ordering and review of laboratory studies, ordering and review of radiographic studies, pulse oximetry, re-evaluation of patient's condition, participation in multidisciplinary rounds and medical decision making of high complexity in the care of this patient.   PRESENT ON ARRIVAL -Possible acute ischemic stroke -Left hemiparesis -Possible aspiration pna -Right carotid stenosis

## 2018-05-23 NOTE — Consult Note (Signed)
NAME:  Ronald [name], MRN:  [id number], DOB:  1946/01/20, LOS: 0 ADMISSION DATE:  05/23/2018, CONSULTATION DATE:  05/23/2018 REFERRING MD:  Dr. Rory Percy, CHIEF COMPLAINT:  AMS  Brief History   73 yoM presenting from home as code stroke for episode of unresponsiveness.  LSW 1755.  Found unresponsive with right upward gaze, urinary incontinence, and required brief BVM.  Upon awakening noted to have some right sided weakness which has since resolved.   CTH/ CTA without findings for acute stroke, showing chronic ICA occlusion, slight worse than prior.  Noted to be hypotensive and bradycardic at times, now requiring neosynephrine.  Thought that hypoperfusion is contributing to poor cerebral perfusion.  Neurology admitting, PCCM consulted to assist in blood pressure management.  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    LSW at 1755 while eating dinner with family. Patient had sudden onset of unresponsiveness.  No reported seizure activity.  Found by EMS with right upward gaze with urinary incontinence.  He required brief BVM until patient started to wake up.  Noted to have some right sided weakness which has since resolved and patient fully alert in ER.   CT head and CTA head/ neck/ perfusion study with no new acute findings, noted old right watershed cerebral strokes and chronic right ICA occlusion.  He was noted to have some hypotension w/ SBP in the 70's and bradycardia in the 50's in the ER, s/p 1.5L.  Had additional episode of brief unresponsiveness in ER with hypoxia with upward gaze.  PCCM consulted as Neurology would like SBP goal for cerebral perfusion 140-180.    Past Medical History  Dementia, depression, hypertension, hyperlipidemia, schizophrenia, prior stroke involving the  right carotid watershed areas with residual left sided weakness with chronic ICA occlusion  Significant Hospital Events   2/25 Admitted >>  Consults:   Procedures:   Significant Diagnostic Tests:  2/25 CTH >> 1. No acute hemorrhage. 2. Hypoattenuation in the right parietal lobe, suspected to be an old infarct. 3. ASPECTS is 10  2/25 CTA head/ neck/ perfusion >> 1. 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 The right vertebral occlusion is unchanged 07/18/2016. The right ICA occlusion has progressed from severe stenosis. 2. Poor opacification of the distal right PCA, likely occluded or severely stenotic. No other intracranial occlusion or high-grade stenosis. 3. Perfusion data analysis shows diffuse right hemispheric ischemia without a core infarct. In the context of occluded right internal carotid and vertebral arteries, the elevated Tmax values might be secondary to the right hemisphere being supplied by contralateral collateral flow. Findings might also indicate a hypoperfusion state.  Micro Data:   Antimicrobials:   Interim history/subjective:  Patient denies complaints.   Objective   Blood pressure (!) 110/50, pulse (!) 58, resp. rate 20, weight 96.3 kg, SpO2 100 %.       No intake or output data in the 24 hours ending 05/23/18 1959 Filed Weights   05/23/18 1909  Weight: 96.3 kg    Examination: General:  Elderly male lying in bed in NAD HEENT: MM pink/moist, pupils 4/reactive, anicteric, poor dentition, no JVD Neuro: Awake, oriented x 3, MAE, slightly weaker noted on left  CV: s1s2 rrr, no murmur PULM: even/non-labored, lungs bilaterally clear, on room air 99% GI: soft, non-tender, bs  active  Extremities: warm/dry, no LE edema  Skin: no rashes   Resolved Hospital Problem list    Assessment & Plan:  Acute encephalopathy with symptoms concerning for seizure vs stroke -no new acute finding on CTH/ CTA with no new  acute findings, noted old right watershed cerebral strokes and chronic right ICA occlusion. P:  Admitted per Neurology Neosynephrine for Goal SBP 140-180 MRI brain ordered  AED per neurology- currently holding for now Further stroke workup per Stroke  Seizure precautions   Hypoxia w/ concern for aspiration - hypoxia correlates with periods of unresponsiveness/ ?seizure - CXR without acute infiltrate, no reported infectious symptoms, afebrile, normal WBC P:  Supplemental O2 for sats >94%, currently on room air Aggressive pulm hygiene Monitor clinically for now Flu pending   Hypotension/ sinus bradycardia Hx HTN/ HLD - neg initial troponin  - s/p 1.5 L with SBP ~120 without pressor P:  Tele monitoring Peripheral neosynephrine for goal SBP 140- 180 Trend lactic, troponin and EKG Hold home lopressor, lisinopril, clonidine Continue lipitor, plavix TTE ordered    AKI and s/p IV contrast Hyponatremia Hyperkalemia - s/p 1.5 L  P:  NS at 75 ml/hr Recheck BMP in 4 hours Trend BMP / urinary output Avoid nephrotoxic agents   Hyperglycemia P:  SSI cbg q 4   Normocytic anemia - chronic, stable Hgb P:  Trend CBC  Best practice:  Diet: NPO Pain/Anxiety/Delirium protocol (if indicated): n/a VAP protocol (if indicated): n/a DVT prophylaxis: heparin SQ GI prophylaxis: n/a Glucose control: SSI Mobility: BR Code Status: Full  Family Communication: patient updated at bedside on plan of care Disposition: ICU   Labs   CBC: Recent Labs  Lab 05/23/18 1840  WBC 6.7  NEUTROABS 4.4  HGB 11.0*  HCT 36.8*  MCV 92.0  PLT 284    Basic Metabolic Panel: Recent Labs  Lab 05/23/18 1840 05/23/18 1848  NA 132*  --   K 5.5*  --   CL 103  --   CO2 17*  --   GLUCOSE 155*  --   BUN 33*  --   CREATININE 1.51* 1.60*  CALCIUM 9.0  --    GFR: CrCl cannot be calculated (Unknown ideal weight.). Recent Labs  Lab 05/23/18 1840  WBC 6.7    Liver Function  Tests: Recent Labs  Lab 05/23/18 1840  AST 42*  ALT 26  ALKPHOS 86  BILITOT 1.7*  PROT 6.0*  ALBUMIN 3.4*   No results for input(s): LIPASE, AMYLASE in the last 168 hours. No results for input(s): AMMONIA in the last 168 hours.  ABG No results found for: PHART, PCO2ART, PO2ART, HCO3, TCO2, ACIDBASEDEF, O2SAT   Coagulation Profile: Recent Labs  Lab 05/23/18 1840  INR 1.2    Cardiac Enzymes: No results for input(s): CKTOTAL, CKMB, CKMBINDEX, TROPONINI in the last 168 hours.  HbA1C: Hgb A1c MFr Bld  Date/Time Value Ref Range Status  07/16/2016 10:17 AM 5.4 4.8 - 5.6 % Final    Comment:    (NOTE)         Pre-diabetes: 5.7 - 6.4         Diabetes: >6.4         Glycemic control for adults with diabetes: <7.0   04/18/2015 08:20 AM 5.2 4.6 - 6.5 % Final    Comment:    Glycemic Control Guidelines for People with Diabetes:Non Diabetic:  <6%Goal of Therapy: <7%Additional Action Suggested:  >8%     CBG: Recent Labs  Lab 05/23/18 1840  GLUCAP 138*    Review of Systems:   Review of Systems  Constitutional: Negative for chills, fever and malaise/fatigue.  HENT: Negative for congestion and sore throat.   Respiratory: Negative for cough, hemoptysis, sputum production, shortness of breath and wheezing.   Cardiovascular: Negative for chest pain, orthopnea and leg swelling.  Gastrointestinal: Negative for abdominal pain, diarrhea, nausea and vomiting.  Skin: Negative for rash.  Neurological: Positive for loss of consciousness. Negative for headaches.  Psychiatric/Behavioral: Negative for substance abuse.    Past Medical History  He,  has a past medical history of Cancer ([place]), Chicken pox, Dementia ([place]), Depression, Heart disease, Hyperlipemia, Hypertension, Measles, Mumps, Schizophrenia simplex ([place]), and Stroke ([place]).   Surgical History    Past Surgical History:  Procedure Laterality Date  . IR ANGIO EXTERNAL CAROTID SEL EXT CAROTID UNI R MOD SED  07/20/2016  . IR  ANGIO INTRA EXTRACRAN SEL INTERNAL CAROTID BILAT MOD SED  07/20/2016  . IR ANGIO VERTEBRAL SEL SUBCLAVIAN INNOMINATE UNI L MOD SED  07/20/2016  . IR ANGIOGRAM EXTREMITY RIGHT  07/20/2016     Social History   reports that he has never smoked. He has never used smokeless tobacco. He reports that he does not drink alcohol or use drugs.   Family History   His family history includes Congestive Heart Failure in his maternal grandmother; Congestive Heart Failure (age of onset: 10) in his father; Diabetes in his mother; Heart attack in his paternal grandmother; Heart failure in his paternal grandmother; Hyperlipidemia in his father; Hypertension in his father and mother; Kidney failure (age of onset: 22) in his mother.   Allergies Allergies  Allergen Reactions  . Dust Mite Extract Other (See Comments)    Allergy symptoms   . Pollen Extract Other (See Comments)    Allergy symptoms   . Rye Grass Flower Pollen Extract [Gramineae Pollens] Other (See Comments)    Allergy Symptoms      Home Medications  Prior to Admission medications   Medication Sig Start Date End Date Taking? Authorizing Provider  ARIPiprazole (ABILIFY) 10 MG tablet TAKE 1 TABLET BY MOUTH EVERY DAY 01/16/16   Brunetta Jeans, PA-C  atorvastatin (LIPITOR) 20 MG tablet Take 1 tablet (20 mg total) by mouth daily at 6 PM. 09/21/16   Melvenia Beam, MD  Calcium-Vitamin D 600-200 MG-UNIT per tablet Take 1 tablet by mouth daily.    [provider]  cloNIDine (CATAPRES) 0.1 MG tablet Take 1 tablet (0.1 mg total) by mouth 3 (three) times daily. 07/21/16   Jani Gravel, MD  clopidogrel (PLAVIX) 75 MG tablet Take 1 tablet (75 mg total) by mouth daily. 07/22/16   Jani Gravel, MD  finasteride (PROSCAR) 5 MG tablet Take 1 tablet (5 mg total) by mouth daily. 07/22/16   Jani Gravel, MD  lisinopril (PRINIVIL,ZESTRIL) 30 MG tablet TAKE 1 TABLET (30 MG TOTAL) BY MOUTH DAILY. 05/28/16   Brunetta Jeans, PA-C  metoprolol (LOPRESSOR) 50 MG  tablet TAKE 1 TABLET BY MOUTH TWICE A DAY 05/28/16   Brunetta Jeans, PA-C         Kennieth Rad, MSN, AGACNP-BC [facility] Pgr: [phone] or if no answer [phone] 05/23/2018, 9:07 PM

## 2018-05-23 NOTE — ED Triage Notes (Signed)
Pt here for activated code stroke, [place].  Pt eating dinner and had acute onset AMS. Right sided gaze with right facial droop. Hx of strokes and seizures in the past.  Left sided neglect and upper proximal weakness seen on exam. Patient initially requiring BVM ventilation in transport. On arrival to ED pt awake and alert responding to questions appropriately.

## 2018-05-23 NOTE — Code Documentation (Signed)
Patient LKW at 1755 while eating dinner.  Per EMS initially AMS and agonal breathing requiring BVM ventilation during transport.  Enroute he rapidly improved.  Fully alert on arrival.  Stat head CT and labs done.  BP 105/56  HR 60s.  O2 sat 96% on RA.  NIHSS 3, left neglect, left arm ataxia, and dysarthria.  CTA and CTP done.  No acute treatment.  Neuro check q 2.  Hand off with Aaron Edelman.

## 2018-05-24 ENCOUNTER — Inpatient Hospital Stay (HOSPITAL_COMMUNITY): Payer: Medicare HMO

## 2018-05-24 DIAGNOSIS — I639 Cerebral infarction, unspecified: Secondary | ICD-10-CM

## 2018-05-24 DIAGNOSIS — G459 Transient cerebral ischemic attack, unspecified: Principal | ICD-10-CM

## 2018-05-24 DIAGNOSIS — Z8673 Personal history of transient ischemic attack (TIA), and cerebral infarction without residual deficits: Secondary | ICD-10-CM

## 2018-05-24 DIAGNOSIS — R4182 Altered mental status, unspecified: Secondary | ICD-10-CM

## 2018-05-24 LAB — URINALYSIS, ROUTINE W REFLEX MICROSCOPIC
Bilirubin Urine: NEGATIVE
Glucose, UA: NEGATIVE mg/dL
Hgb urine dipstick: NEGATIVE
Ketones, ur: NEGATIVE mg/dL
Leukocytes,Ua: NEGATIVE
Nitrite: NEGATIVE
Protein, ur: NEGATIVE mg/dL
SPECIFIC GRAVITY, URINE: 1.003 — AB (ref 1.005–1.030)
pH: 6 (ref 5.0–8.0)

## 2018-05-24 LAB — RAPID URINE DRUG SCREEN, HOSP PERFORMED
Amphetamines: NOT DETECTED
Barbiturates: NOT DETECTED
Benzodiazepines: NOT DETECTED
Cocaine: NOT DETECTED
Opiates: NOT DETECTED
Tetrahydrocannabinol: NOT DETECTED

## 2018-05-24 LAB — GLUCOSE, CAPILLARY
Glucose-Capillary: 100 mg/dL — ABNORMAL HIGH (ref 70–99)
Glucose-Capillary: 106 mg/dL — ABNORMAL HIGH (ref 70–99)
Glucose-Capillary: 136 mg/dL — ABNORMAL HIGH (ref 70–99)
Glucose-Capillary: 68 mg/dL — ABNORMAL LOW (ref 70–99)
Glucose-Capillary: 74 mg/dL (ref 70–99)
Glucose-Capillary: 85 mg/dL (ref 70–99)
Glucose-Capillary: 95 mg/dL (ref 70–99)

## 2018-05-24 LAB — RENAL FUNCTION PANEL
Albumin: 3.1 g/dL — ABNORMAL LOW (ref 3.5–5.0)
Anion gap: 8 (ref 5–15)
BUN: 25 mg/dL — ABNORMAL HIGH (ref 8–23)
CO2: 23 mmol/L (ref 22–32)
Calcium: 8.6 mg/dL — ABNORMAL LOW (ref 8.9–10.3)
Chloride: 107 mmol/L (ref 98–111)
Creatinine, Ser: 1.04 mg/dL (ref 0.61–1.24)
GFR calc Af Amer: >60 mL/min (ref >60)
GFR calc non Af Amer: >60 mL/min (ref >60)
GLUCOSE: 148 mg/dL — AB (ref 70–99)
Phosphorus: 3 mg/dL (ref 2.5–4.6)
Potassium: 4.3 mmol/L (ref 3.5–5.1)
Sodium: 138 mmol/L (ref 135–145)

## 2018-05-24 LAB — CBC
HCT: 36.2 % — ABNORMAL LOW (ref 39.0–52.0)
Hemoglobin: 10.8 g/dL — ABNORMAL LOW (ref 13.0–17.0)
MCH: 27.2 pg (ref 26.0–34.0)
MCHC: 29.8 g/dL — ABNORMAL LOW (ref 30.0–36.0)
MCV: 91.2 fL (ref 80.0–100.0)
Platelets: 255 10*3/uL (ref 150–400)
RBC: 3.97 MIL/uL — ABNORMAL LOW (ref 4.22–5.81)
RDW: 14 % (ref 11.5–15.5)
WBC: 6.7 10*3/uL (ref 4.0–10.5)
nRBC: 0 % (ref 0.0–0.2)

## 2018-05-24 LAB — LIPID PANEL
Cholesterol: 122 mg/dL (ref 0–200)
HDL: 34 mg/dL — ABNORMAL LOW (ref >40)
LDL Cholesterol: 74 mg/dL (ref 0–99)
TRIGLYCERIDES: 72 mg/dL (ref <150)
Total CHOL/HDL Ratio: 3.6 RATIO
VLDL: 14 mg/dL (ref 0–40)

## 2018-05-24 LAB — MAGNESIUM: Magnesium: 2 mg/dL (ref 1.7–2.4)

## 2018-05-24 LAB — LACTIC ACID, PLASMA: Lactic Acid, Venous: 1 mmol/L (ref 0.5–1.9)

## 2018-05-24 LAB — HEMOGLOBIN A1C
Hgb A1c MFr Bld: 5.1 % (ref 4.8–5.6)
Mean Plasma Glucose: 99.67 mg/dL

## 2018-05-24 LAB — TROPONIN I: Troponin I: <0.03 ng/mL (ref <0.03)

## 2018-05-24 MED ORDER — ATORVASTATIN CALCIUM 80 MG PO TABS
80.0000 mg | ORAL_TABLET | Freq: Every day | ORAL | Status: DC
  Administered 2018-05-24 – 2018-05-26 (×3): 80 mg via ORAL
  Filled 2018-05-24 (×3): qty 1

## 2018-05-24 MED ORDER — FINASTERIDE 5 MG PO TABS
5.0000 mg | ORAL_TABLET | Freq: Every day | ORAL | Status: DC
  Administered 2018-05-24 – 2018-05-26 (×3): 5 mg via ORAL
  Filled 2018-05-24 (×3): qty 1

## 2018-05-24 MED ORDER — ARIPIPRAZOLE 10 MG PO TABS: 10.0000 mg | ORAL_TABLET | Freq: Every day | ORAL | Status: DC

## 2018-05-24 MED ORDER — INSULIN ASPART 100 UNIT/ML ~~LOC~~ SOLN
0.0000 [IU] | Freq: Three times a day (TID) | SUBCUTANEOUS | Status: DC
  Administered 2018-05-24 – 2018-05-26 (×2): 1 [IU] via SUBCUTANEOUS

## 2018-05-24 MED ORDER — CALCIUM CARBONATE-VITAMIN D 500-200 MG-UNIT PO TABS
1.0000 | ORAL_TABLET | Freq: Every day | ORAL | Status: DC
  Administered 2018-05-25 – 2018-05-26 (×2): 1 via ORAL
  Filled 2018-05-24 (×2): qty 1

## 2018-05-24 MED ORDER — CALCIUM-VITAMIN D 600-200 MG-UNIT PO TABS: 1.0000 | ORAL_TABLET | Freq: Every day | ORAL | Status: DC

## 2018-05-24 NOTE — Evaluation (Signed)
Speech Language Pathology Evaluation Patient Details Name: Torryn Hudspeth MRN: [id number] DOB: Jul 10, 1945 Today's Date: 05/24/2018 Time: 9702-6378 SLP Time Calculation (min) (ACUTE ONLY): 19 min  Problem List:  Patient Active Problem List   Diagnosis Date Noted  . Cerebral hypoperfusion 05/23/2018  . 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 Cerebral thrombosis with cerebral infarction 07/20/2016  . Non-traumatic rhabdomyolysis   . Bronchitis 07/15/2016  . Dementia ([place]) 07/15/2016  . CAP (community acquired pneumonia) 07/15/2016  . Acute left hemiparesis ([place]) 07/15/2016  . Abnormal urinalysis 07/15/2016  . Effusion of left knee 07/15/2016  . Elevated AST (SGOT) 07/15/2016  . Elevated troponin 07/15/2016  . Lobar pneumonia ([place])   . Prostate cancer ([place]) 10/21/2015  . Other seasonal allergic rhinitis 10/21/2015  . Essential hypertension 01/16/2014  . Bipolar 1 disorder ([place]) 08/02/2013  . GERD (gastroesophageal reflux disease) 07/04/2013  . Elevated PSA, less than 10 ng/ml 07/04/2013  . BPH with elevated PSA 02/28/2013  . Encounter for Medicare annual wellness exam 02/28/2013  . Hyperlipidemia 02/28/2013   Past Medical History:  Past Medical History:  Diagnosis Date  . Cancer [facility])    Prostate  . Chicken pox   . Dementia ([place])   . Depression   . Heart disease   . Hyperlipemia   . Hypertension   . Measles   . Mumps   . 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 Stroke [facility])    Past Surgical History:  Past Surgical History:  Procedure Laterality Date  . IR ANGIO EXTERNAL CAROTID SEL EXT CAROTID UNI R MOD SED  07/20/2016  . IR ANGIO INTRA EXTRACRAN SEL INTERNAL CAROTID BILAT MOD SED  07/20/2016  . IR ANGIO VERTEBRAL SEL SUBCLAVIAN INNOMINATE UNI L MOD SED  07/20/2016  . IR ANGIOGRAM EXTREMITY RIGHT  07/20/2016   HPI:  73 yo male admitted after sudden onset of unresponsiveness on 2/25, CT and MRI showing no acute  stroke. PMH: dementia, depression, hypertension, hyperlipidemia, schizophrenia, stroke involving R carotid watershed areas with residual left sided weakness (2018).   Assessment / Plan / Recommendation Clinical Impression  Pt's sister reported he had been mildly declining from a cognitve standpoint however upon clarification, appeared to be more behavioral (bizarre behavior observed by family and pt's PCP). A neurology appointment was made but pt refused to go. He lives at home with his brother and manages his medication and finances independently. MOCA was administered minus 2 parts of the visuospatial/executive subtest (glasses not present). Recalled 3/5 words during recall subtest independently, drew clock and hands in correct position given desired time, subraction portion of attention subtest and abstraction subtest was WFL's. Pt is verbose and often tangential but easily redirected. He stated he has an appointment with neurology March 3rd and understands the importance of attending appointment. At this point, he is functional to have needs met and navigate in the acute care setting. Recommend family observe/monitor pt intermittently to ensure accuracy of medicine/bills. Recommended to pt he may need to transition to a pill box format. Home health ST services are available if he requires more assistance.       SLP Assessment  SLP Recommendation/Assessment: All further Speech Lanaguage Pathology  needs can be addressed in the next venue of care(if symptoms worsen) SLP Visit Diagnosis: Cognitive communication deficit (R41.841)    Follow Up Recommendations       Frequency and Duration           SLP  Evaluation Cognition  Overall Cognitive Status: History of cognitive impairments - at baseline Arousal/Alertness: Awake/alert Orientation Level: Oriented to person;Oriented to place;Oriented to time Attention: Sustained Sustained Attention: Appears intact Memory: Impaired Memory Impairment:  Retrieval deficit Awareness: Impaired Awareness Impairment: Anticipatory impairment Problem Solving: (suspect deficits) Safety/Judgment: Appears intact       Comprehension  Auditory Comprehension Overall Auditory Comprehension: Appears within functional limits for tasks assessed Visual Recognition/Discrimination Discrimination: Not tested Reading Comprehension Reading Status: Not tested(could not see without glasses)    Expression Expression Primary Mode of Expression: Verbal Verbal Expression Overall Verbal Expression: Appears within functional limits for tasks assessed Initiation: No impairment Level of Generative/Spontaneous Verbalization: Conversation Repetition: (NT) Naming: Not tested Pragmatics: Impairment Impairments: Topic maintenance Written Expression Dominant Hand: Right Written Expression: Not tested   Oral / Motor  Oral Motor/Sensory Function Overall Oral Motor/Sensory Function: Within functional limits Motor Speech Overall Motor Speech: Appears within functional limits for tasks assessed Respiration: Within functional limits Phonation: Normal Resonance: Within functional limits Articulation: Within functional limitis Intelligibility: Intelligible Motor Planning: Witnin functional limits   GO                    Houston Siren 05/24/2018, 2:24 PM   Orbie Pyo  M.Ed Risk analyst [phone] Office [phone]

## 2018-05-24 NOTE — Progress Notes (Signed)
EEG Completed; Results Pending  

## 2018-05-24 NOTE — Evaluation (Signed)
Physical Therapy Evaluation Patient Details Name: Ronald [name] MRN: [id number] DOB: 07/24/1945 Today's Date: 05/24/2018   History of Present Illness  73 yo male admitted after sudden onset of unresponsiveness on 2/25, CT and MRI showing no acute stroke. PMH: dementia, depression, hypertension, hyperlipidemia, schizophrenia, stroke involving R carotid watershed areas with residual left sided weakness  Clinical Impression  Pt is a 73 yo male admitted for code stroke and above. Pt presents with decreased activity tolerance and functional mobility. Pt performed all functional mobility tasks with supervision and min guard assist. BP sitting EOB: 178/68, BP after mobility: 183/72 with nursing notified. Pt would benefit from acute skilled therapy to improve safety and independence with functional mobility and improve activity tolerance. Pt educated on plan and consented.     Follow Up Recommendations No PT follow up    Equipment Recommendations  None recommended by PT    Recommendations for Other Services       Precautions / Restrictions Precautions Precautions: Fall Restrictions Weight Bearing Restrictions: No      Mobility  Bed Mobility Overal bed mobility: Needs Assistance Bed Mobility: Supine to Sit     Supine to sit: Supervision;HOB elevated     General bed mobility comments: pt able to sit EOB with supervision and management of lines/leads and increased time  Transfers Overall transfer level: Needs assistance Equipment used: Rolling walker (2 wheeled) Transfers: Sit to/from Stand Sit to Stand: Min guard         General transfer comment: min guard for safety and management of lines/leads, required cuing for hand placement   Ambulation/Gait Ambulation/Gait assistance: Min guard Gait Distance (Feet): 100 Feet Assistive device: Rolling walker (2 wheeled) Gait Pattern/deviations: Step-through pattern;Decreased stride length Gait velocity: decreased   General Gait  Details: pt ambulates with step through pattern with decreased gait speed, pt states gait speed is normal for him and he is cautious because he does not want to fall, pts sister agreed pt was ambulating with normal gait pattern, pt required cuing for keeping RW close  Stairs            Wheelchair Mobility    Modified Rankin (Stroke Patients Only) Modified Rankin (Stroke Patients Only) Pre-Morbid Rankin Score: No significant disability Modified Rankin: Slight disability     Balance Overall balance assessment: Mild deficits observed, not formally tested(pt with no LOB during transfers, gait training or standing at sink performing hand and face washing with no UE support)                                           Pertinent Vitals/Pain Pain Assessment: (P) Faces    Home Living Family/patient expects to be discharged to:: Private residence Living Arrangements: Other relatives Available Help at Discharge: (P) Family;Available 24 hours/day Type of Home: (P) House Home Access: Level entry     Home Layout: Able to live on main level with bedroom/bathroom Home Equipment: Walker - 2 wheels;Cane - single point;Wheelchair - manual;Tub bench      Prior Function Level of Independence: Independent         Comments: sometimes used RW and cane at home if having a bad day     Hand Dominance        Extremity/Trunk Assessment   Upper Extremity Assessment Upper Extremity Assessment: Defer to OT evaluation    Lower Extremity Assessment Lower Extremity Assessment: Overall [facility]  for tasks assessed       Communication   Communication: No difficulties  Cognition Arousal/Alertness: Awake/alert Behavior During Therapy: WFL for tasks assessed/performed Overall Cognitive Status: Within Functional Limits for tasks assessed                                        General Comments      Exercises     Assessment/Plan    PT Assessment Patient  needs continued PT services  PT Problem List Decreased strength;Decreased range of motion;Decreased activity tolerance;Decreased knowledge of use of DME;Decreased balance;Decreased safety awareness;Decreased mobility;Decreased knowledge of precautions       PT Treatment Interventions DME instruction;Therapeutic exercise;Gait training;Balance training;Stair training;Neuromuscular re-education;Functional mobility training;Therapeutic activities;Patient/family education    PT Goals (Current goals can be found in the Care Plan section)  Acute Rehab PT Goals Patient Stated Goal: return home PT Goal Formulation: With patient Time For Goal Achievement: 05/31/18 Potential to Achieve Goals: Good    Frequency Min 4X/week   Barriers to discharge        Co-evaluation PT/OT/SLP Co-Evaluation/Treatment: Yes Reason for Co-Treatment: Necessary to address cognition/behavior during functional activity;For patient/therapist safety;To address functional/ADL transfers PT goals addressed during session: Mobility/safety with mobility;Balance;Proper use of DME;Strengthening/ROM         AM-PAC PT "6 Clicks" Mobility  Outcome Measure Help needed turning from your back to your side while in a flat bed without using bedrails?: A Little Help needed moving from lying on your back to sitting on the side of a flat bed without using bedrails?: A Little Help needed moving to and from a bed to a chair (including a wheelchair)?: A Little Help needed standing up from a chair using your arms (e.g., wheelchair or bedside chair)?: A Little Help needed to walk in hospital room?: A Little Help needed climbing 3-5 steps with a railing? : A Lot 6 Click Score: 17    End of Session Equipment Utilized During Treatment: Gait belt Activity Tolerance: Patient tolerated treatment well Patient left: in chair;with call bell/phone within reach;with family/visitor present Nurse Communication: Mobility status PT Visit Diagnosis:  Other abnormalities of gait and mobility (R26.89);Other symptoms and signs involving the nervous system (R29.898)    Time: 3220-2542 PT Time Calculation (min) (ACUTE ONLY): 33 min   Charges:   PT Evaluation $PT Eval Moderate Complexity: 1 Mod          Ronald [name], SPT [phone]   Ronald [name] 05/24/2018, 1:50 PM

## 2018-05-24 NOTE — Procedures (Signed)
ELECTROENCEPHALOGRAM REPORT   Patient: Ronald [name]       Room #: 4N26C EEG No. ID: [id number] Age: 73 y.o.        Sex: male Referring Physician: Erlinda Hong Report Date:  05/24/2018        Interpreting Physician: Alexis Goodell  History: Ronald [name] is an 73 y.o. male with an episode of unresponsiveness  Medications:  Lipitor, Plavix, Proscar, Insulin, Neosynephrine  Conditions of Recording:  This is a 21 channel routine scalp EEG performed with bipolar and monopolar montages arranged in accordance to the international 10/20 system of electrode placement. One channel was dedicated to EKG recording.  The patient is in the awake and drowsy states.  Description:  The waking background activity consists of a low voltage, symmetrical, fairly well organized, 11 Hz alpha activity, seen from the parieto-occipital and posterior temporal regions.  Low voltage fast activity, poorly organized, is seen anteriorly and is at times superimposed on more posterior regions.  A mixture of theta and alpha rhythms are seen from the central and temporal regions. The patient drowses briefly with slowing to irregular, low voltage theta and beta activity.   Stage II sleep is not obtained. No epileptiform activity is noted.   Hyperventilation and intermittent photic stimulation were not performed.   IMPRESSION: Normal electroencephalogram, awake and briefly drowsy. There are no focal lateralizing or epileptiform features.   Alexis Goodell, MD Neurology [phone] 05/24/2018, 12:37 PM

## 2018-05-24 NOTE — Consult Note (Deleted)
NAME:  Ronald [name], MRN:  [id number], DOB:  02/11/1946, LOS: 1 ADMISSION DATE:  05/23/2018, CONSULTATION DATE:  05/23/2018 REFERRING MD:  Dr. Rory Percy, CHIEF COMPLAINT:  AMS  Brief History   73 yoM presenting from home as code stroke for episode of unresponsiveness.  LSW 1755.  Found unresponsive with right upward gaze, urinary incontinence, and required brief BVM.  Upon awakening noted to have some right sided weakness which has since resolved.   CTH/ CTA without findings for acute stroke, showing chronic ICA occlusion, slight worse than prior.  Noted to be hypotensive and bradycardic at times, now requiring neosynephrine.  Thought that hypoperfusion is contributing to poor cerebral perfusion.  Neurology admitting, PCCM consulted to assist in blood pressure management.  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    LSW at 1755 while eating dinner with family. Patient had sudden onset of unresponsiveness.  No reported seizure activity.  Found by EMS with right upward gaze with urinary incontinence.  He required brief BVM until patient started to wake up.  Noted to have some right sided weakness which has since resolved and patient fully alert in ER.   CT head and CTA head/ neck/ perfusion study with no new acute findings, noted old right watershed cerebral strokes and chronic right ICA occlusion.  He was noted to have some hypotension w/ SBP in the 70's and bradycardia in the 50's in the ER, s/p 1.5L.  Had additional episode of brief unresponsiveness in ER with hypoxia with upward gaze.  PCCM consulted as Neurology would like SBP goal for cerebral perfusion 140-180.    Past Medical History  Dementia, depression, hypertension, hyperlipidemia, schizophrenia, prior stroke involving the  right carotid watershed areas with residual left sided weakness with chronic ICA occlusion  Significant Hospital Events   2/25 Admitted >>  Consults:   Procedures:   Significant Diagnostic Tests:  2/25 CTH >> No acute hemorrhage. Hypoattenuation in the right parietal lobe, suspected to be an old infarct.  2/25 CTA head/ neck/ perfusion >> Occlusion of the right internal carotid and vertebral arteries along their entire lengths. The right vertebral occlusion is unchanged 07/18/2016. The right ICA occlusion has progressed from severe stenosis. Poor opacification of the distal right PCA, likely occluded or severely stenotic. No other intracranial occlusion or high-grade stenosis. Perfusion data analysis shows diffuse right hemispheric ischemia without a core infarct. In the context of occluded right internal carotid and vertebral arteries, the elevated Tmax values might be secondary to the right hemisphere being supplied by contralateral collateral flow. Findings might also indicate a hypoperfusion state. MRI brain 2/26 > No acute intracranial process. Multifocal old RIGHT MCA and posterior border zone territory infarcts. RIGHT ICA occlusion. Moderate parenchymal brain volume loss.  Micro Data:   Antimicrobials:   Interim history/subjective:  No acute events overnight. Weaning off pressors, Still remains on low dose phenylephrine.   Objective   Blood pressure (!) 143/68, pulse (!) 54, temperature 98.4 F (36.9 C), temperature source Oral, resp. rate 17, height 5\' 7"  (1.702 m), weight 93 kg, SpO2 100 %.        Intake/Output Summary (Last 24 hours) at 05/24/2018 0807 Last data filed at 05/24/2018 0600 Gross per 24 hour  Intake 1056.11 ml  Output 2150 ml  Net -1093.89 ml   [employer]  05/23/18 1909 05/23/18 2031  Weight: 96.3 kg 93 kg    Examination: General:  Elderly male lying in bed in NAD HEENT: Poor dentition. PERRL, no JVD Neuro: Awake, oriented x 3. Very talkative.  CV:  s1s2 rrr, no murmur PULM: even/non-labored, lungs bilaterally clear, on room air 99% GI: soft, non-tender, bs active  Extremities: warm/dry, no LE edema  Skin: no rashes   Resolved Hospital Problem list    Assessment & Plan:   Acute encephalopathy with symptoms concerning for seizure vs stroke -no acute finding on CTH/CTA/MRI, noted old right watershed cerebral strokes and chronic right ICA occlusion. Did have one episode of hypoglycemia overnight. Question importance of this as he had not been eating.  P:  Neurology primary Neosynephrine for Goal SBP 140-180 mmHg Seizure precautions Holding off on AEDs Continue to monitor CBG EEG pending  Hypoxia w/ concern for aspiration - hypoxia correlates with periods of unresponsiveness/ ?seizure - CXR without acute infiltrate, no reported infectious symptoms, afebrile, normal WBC P:  Supplemental O2 for sats >94%, currently on room air with sats 99% Monitor clinically for now Flu pending  Hypotension Hx HTN/ HLD P:  Tele monitoring Peripheral neosynephrine for goal SBP 140- 180 (for neuro goals) Hold home lopressor, lisinopril, clonidine Continue lipitor, plavix Echo pending.   AKI and s/p IV contrast improved Hyponatremia: improved Hyperkalemia: improved P:  Now taking PO will stop NS maintenance  Trend BMP / urinary output  Hyperglycemia/Hypoglycemia P:  SSI cbg q 4 Hypoglycemia protocol in place  Normocytic anemia - chronic, stable Hgb at baseline.  P:  Trend CBC  Best practice:  Diet: NPO Pain/Anxiety/Delirium protocol (if indicated): n/a VAP protocol (if indicated): n/a DVT prophylaxis: heparin SQ GI prophylaxis: n/a Glucose control: SSI Mobility: BR Code Status: Full  Family Communication: patient updated at bedside on plan of care Disposition: ICU   Labs   CBC: Recent Labs  Lab 05/23/18 1840 05/24/18 0008  WBC 6.7 6.7  NEUTROABS 4.4  --   HGB 11.0* 10.8*  HCT 36.8* 36.2*  MCV 92.0 91.2  PLT  289 449    Basic Metabolic Panel: Recent Labs  Lab 05/23/18 1840 05/23/18 1848 05/24/18 0008  NA 132*  --  138  K 5.5*  --  4.3  CL 103  --  107  CO2 17*  --  23  GLUCOSE 155*  --  148*  BUN 33*  --  25*  CREATININE 1.51* 1.60* 1.04  CALCIUM 9.0  --  8.6*  MG  --   --  2.0  PHOS  --   --  3.0   GFR: Estimated Creatinine Clearance: 70.9 mL/min (by C-G formula based on SCr of 1.04 mg/dL). Recent Labs  Lab 05/23/18 1840 05/23/18 2120 05/24/18 0008  WBC 6.7  --  6.7  LATICACIDVEN  --  1.4 1.0    Liver Function Tests: Recent Labs  Lab 05/23/18 1840 05/24/18 0008  AST 42*  --   ALT 26  --   ALKPHOS 86  --   BILITOT 1.7*  --   PROT 6.0*  --   ALBUMIN 3.4* 3.1*   No results for input(s): LIPASE, AMYLASE in the last 168 hours. No results for input(s): AMMONIA in the last 168 hours.  ABG No results found for: PHART, PCO2ART, PO2ART, HCO3, TCO2, ACIDBASEDEF, O2SAT   Coagulation Profile: Recent Labs  Lab 05/23/18 1840  INR 1.2    Cardiac Enzymes: Recent Labs  Lab 05/24/18 0008  TROPONINI <0.03  HbA1C: Hgb A1c MFr Bld  Date/Time Value Ref Range Status  05/24/2018 12:08 AM 5.1 4.8 - 5.6 % Final    Comment:    (NOTE) Pre diabetes:          5.7%-6.4% Diabetes:              >6.4% Glycemic control for   <7.0% adults with diabetes   07/16/2016 10:17 AM 5.4 4.8 - 5.6 % Final    Comment:    (NOTE)         Pre-diabetes: 5.7 - 6.4         Diabetes: >6.4         Glycemic control for adults with diabetes: <7.0     CBG: Recent Labs  Lab 05/23/18 1840 05/23/18 2357 05/24/18 0025 05/24/18 0416  GLUCAP 138* 68* 95 85    Critical care time:     Georgann Housekeeper, AGACNP-BC [place] Pager [phone] or [phone]  05/24/2018 8:07 AM

## 2018-05-24 NOTE — Progress Notes (Signed)
STROKE TEAM PROGRESS NOTE   SUBJECTIVE (INTERVAL HISTORY) His RN is at the bedside.  Overall his condition is completely resolved.  Patient BP low on admission, responding to IV fluid, currently BP stable, neuro intact.  MRI showed no acute stroke.  However, CT head and neck showed right ICA occlusion progressed from previous ICA siphon to now proximal ICA.   OBJECTIVE Temp:  [98.4 F (36.9 C)-99.1 F (37.3 C)] 98.5 F (36.9 C) (02/26 2000) Pulse Rate:  [30-106] 70 (02/26 2300) Cardiac Rhythm: Normal sinus rhythm (02/26 2000) Resp:  [13-22] 17 (02/26 2300) BP: (95-195)/(56-122) 160/56 (02/26 2300) SpO2:  [59 %-100 %] 98 % (02/26 2300)  Recent Labs  Lab 05/24/18 0416 05/24/18 0851 05/24/18 1206 05/24/18 1543 05/24/18 1955  GLUCAP 85 74 136* 100* 106*   Recent Labs  Lab 05/23/18 1840 05/23/18 1848 05/24/18 0008  NA 132*  --  138  K 5.5*  --  4.3  CL 103  --  107  CO2 17*  --  23  GLUCOSE 155*  --  148*  BUN 33*  --  25*  CREATININE 1.51* 1.60* 1.04  CALCIUM 9.0  --  8.6*  MG  --   --  2.0  PHOS  --   --  3.0   Recent Labs  Lab 05/23/18 1840 05/24/18 0008  AST 42*  --   ALT 26  --   ALKPHOS 86  --   BILITOT 1.7*  --   PROT 6.0*  --   ALBUMIN 3.4* 3.1*   Recent Labs  Lab 05/23/18 1840 05/24/18 0008  WBC 6.7 6.7  NEUTROABS 4.4  --   HGB 11.0* 10.8*  HCT 36.8* 36.2*  MCV 92.0 91.2  PLT 289 255   Recent Labs  Lab 05/24/18 0008  TROPONINI <0.03   Recent Labs    05/23/18 1840  LABPROT 14.6  INR 1.2   Recent Labs    05/23/18 2118 05/24/18 1600  COLORURINE STRAW* COLORLESS*  LABSPEC 1.020 1.003*  PHURINE 6.0 6.0  GLUCOSEU NEGATIVE NEGATIVE  HGBUR NEGATIVE NEGATIVE  BILIRUBINUR NEGATIVE NEGATIVE  KETONESUR NEGATIVE NEGATIVE  PROTEINUR NEGATIVE NEGATIVE  NITRITE NEGATIVE NEGATIVE  LEUKOCYTESUR NEGATIVE NEGATIVE       Component Value Date/Time   CHOL 122 05/24/2018 0008   TRIG 72 05/24/2018 0008   HDL 34 (L) 05/24/2018 0008   CHOLHDL  3.6 05/24/2018 0008   VLDL 14 05/24/2018 0008   LDLCALC 74 05/24/2018 0008   Lab Results  Component Value Date   HGBA1C 5.1 05/24/2018      Component Value Date/Time   LABOPIA NONE DETECTED 05/24/2018 1600   COCAINSCRNUR NONE DETECTED 05/24/2018 1600   LABBENZ NONE DETECTED 05/24/2018 1600   AMPHETMU NONE DETECTED 05/24/2018 1600   THCU NONE DETECTED 05/24/2018 1600   LABBARB NONE DETECTED 05/24/2018 1600    No results for input(s): ETH in the last 168 hours.  I have personally reviewed the radiological images below and agree with the radiology interpretations.  Ct Angio Head W Or Wo Contrast  Result Date: 05/23/2018 CLINICAL DATA:  Altered mental status.  Focal neurologic deficit. EXAM: CT ANGIOGRAPHY HEAD AND NECK CT PERFUSION BRAIN TECHNIQUE: Multidetector CT imaging of the head and neck was performed using the standard protocol during bolus administration of intravenous contrast. Multiplanar CT image reconstructions and MIPs were obtained to evaluate the vascular anatomy. Carotid stenosis measurements (when applicable) are obtained utilizing NASCET criteria, using the distal internal carotid diameter as the denominator. Multiphase  CT imaging of the brain was performed following IV bolus contrast injection. Subsequent parametric perfusion maps were calculated using RAPID software. CONTRAST:  148mL ISOVUE-370 IOPAMIDOL (ISOVUE-370) INJECTION 76% COMPARISON:  Head CT 05/23/2018 FINDINGS: CTA NECK FINDINGS SKELETON: There is no bony spinal canal stenosis. No lytic or blastic lesion. OTHER NECK: Normal pharynx, larynx and major salivary glands. No cervical lymphadenopathy. Unremarkable thyroid gland. UPPER CHEST: No pneumothorax or pleural effusion. No nodules or masses. AORTIC ARCH: There is no calcific atherosclerosis of the aortic arch. There is no aneurysm, dissection or hemodynamically significant stenosis of the visualized ascending aorta and aortic arch. Conventional 3 vessel aortic  branching pattern. The visualized proximal subclavian arteries are widely patent. RIGHT CAROTID SYSTEM: --Common carotid artery: Widely patent origin without common carotid artery dissection or aneurysm. 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 No reconstitution to the skull base. Compared to the CTA from 07/18/2016 the occlusion has worsened from a previously severe stenosis. --External carotid artery: No acute abnormality. LEFT CAROTID SYSTEM: --Common carotid artery: Widely patent origin without common carotid artery dissection or aneurysm. --Internal carotid artery: Normal without aneurysm, dissection or stenosis. --External carotid artery: No acute abnormality. VERTEBRAL ARTERIES: Left dominant configuration. Left vertebral artery origin is normal. The right vertebral artery is occluded from its origin to the vertebrobasilar confluence, unchanged. CTA HEAD FINDINGS POSTERIOR CIRCULATION: --Basilar artery: Normal. --Posterior cerebral arteries: Diminished flow in the right PCA, which is predominantly supplied by the right vertebral artery. Left PCA is normal. Small left P-comm. --Superior cerebellar arteries: Normal. --Inferior cerebellar arteries: Right PICA is opacified via collateral flow across the vertebrobasilar confluence. ANTERIOR CIRCULATION: --Intracranial internal carotid arteries: No flow in the right ICA. Mild calcification along the course of the left ICA. --Anterior cerebral arteries: Normal. Both A1 segments are present. Patent anterior communicating artery. --Middle cerebral arteries: Normal. --Posterior communicating arteries: Present bilaterally. VENOUS SINUSES: Minimal opacification due to contrast timing. ANATOMIC VARIANTS: None DELAYED PHASE: Not performed. Review of the MIP images confirms the above findings. CT Brain Perfusion Findings: CBF (<30%) Volume: 66mL Perfusion (Tmax>6.0s) volume: 387mL Mismatch Volume: 326mL Commentary: Entire right hemisphere is labeled as  ischemic penumbra by computerized data analysis. However, this may be due to reduced or delayed perfusion of the right hemisphere, which is supplied entirely by collateral flow, as the vertebral and internal carotid arteries on the right are both occluded. IMPRESSION: 1. 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 The right vertebral occlusion is unchanged 07/18/2016. The right ICA occlusion has progressed from severe stenosis. 2. Poor opacification of the distal right PCA, likely occluded or severely stenotic. No other intracranial occlusion or high-grade stenosis. 3. Perfusion data analysis shows diffuse right hemispheric ischemia without a core infarct. In the context of occluded right internal carotid and vertebral arteries, the elevated Tmax values might be secondary to the right hemisphere being supplied by contralateral collateral flow. Findings might also indicate a hypoperfusion state. These results were called by telephone at the time of interpretation on 05/23/2018 at 7:31 pm to Dr. Amie Portland , who verbally acknowledged these results. Electronically Signed   By: Ulyses Jarred M.D.   On: 05/23/2018 19:33   Ct Angio Neck W Or Wo Contrast  Result Date: 05/23/2018 CLINICAL DATA:  Altered mental status.  Focal neurologic deficit. EXAM: CT ANGIOGRAPHY HEAD AND NECK CT PERFUSION BRAIN TECHNIQUE: Multidetector CT imaging of the head and neck was performed using the standard protocol during bolus administration of  intravenous contrast. Multiplanar CT image reconstructions and MIPs were obtained to evaluate the vascular anatomy. Carotid stenosis measurements (when applicable) are obtained utilizing NASCET criteria, using the distal internal carotid diameter as the denominator. Multiphase CT imaging of the brain was performed following IV bolus contrast injection. Subsequent parametric perfusion maps were calculated using RAPID software. CONTRAST:  131mL  ISOVUE-370 IOPAMIDOL (ISOVUE-370) INJECTION 76% COMPARISON:  Head CT 05/23/2018 FINDINGS: CTA NECK FINDINGS SKELETON: There is no bony spinal canal stenosis. No lytic or blastic lesion. OTHER NECK: Normal pharynx, larynx and major salivary glands. No cervical lymphadenopathy. Unremarkable thyroid gland. UPPER CHEST: No pneumothorax or pleural effusion. No nodules or masses. AORTIC ARCH: There is no calcific atherosclerosis of the aortic arch. There is no aneurysm, dissection or hemodynamically significant stenosis of the visualized ascending aorta and aortic arch. Conventional 3 vessel aortic branching pattern. The visualized proximal subclavian arteries are widely patent. RIGHT CAROTID SYSTEM: --Common carotid artery: Widely patent origin without common carotid artery dissection or aneurysm. 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 No reconstitution to the skull base. Compared to the CTA from 07/18/2016 the occlusion has worsened from a previously severe stenosis. --External carotid artery: No acute abnormality. LEFT CAROTID SYSTEM: --Common carotid artery: Widely patent origin without common carotid artery dissection or aneurysm. --Internal carotid artery: Normal without aneurysm, dissection or stenosis. --External carotid artery: No acute abnormality. VERTEBRAL ARTERIES: Left dominant configuration. Left vertebral artery origin is normal. The right vertebral artery is occluded from its origin to the vertebrobasilar confluence, unchanged. CTA HEAD FINDINGS POSTERIOR CIRCULATION: --Basilar artery: Normal. --Posterior cerebral arteries: Diminished flow in the right PCA, which is predominantly supplied by the right vertebral artery. Left PCA is normal. Small left P-comm. --Superior cerebellar arteries: Normal. --Inferior cerebellar arteries: Right PICA is opacified via collateral flow across the vertebrobasilar confluence. ANTERIOR CIRCULATION: --Intracranial internal carotid arteries: No flow  in the right ICA. Mild calcification along the course of the left ICA. --Anterior cerebral arteries: Normal. Both A1 segments are present. Patent anterior communicating artery. --Middle cerebral arteries: Normal. --Posterior communicating arteries: Present bilaterally. VENOUS SINUSES: Minimal opacification due to contrast timing. ANATOMIC VARIANTS: None DELAYED PHASE: Not performed. Review of the MIP images confirms the above findings. CT Brain Perfusion Findings: CBF (<30%) Volume: 58mL Perfusion (Tmax>6.0s) volume: 367mL Mismatch Volume: 356mL Commentary: Entire right hemisphere is labeled as ischemic penumbra by computerized data analysis. However, this may be due to reduced or delayed perfusion of the right hemisphere, which is supplied entirely by collateral flow, as the vertebral and internal carotid arteries on the right are both occluded. IMPRESSION: 1. 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 The right vertebral occlusion is unchanged 07/18/2016. The right ICA occlusion has progressed from severe stenosis. 2. Poor opacification of the distal right PCA, likely occluded or severely stenotic. No other intracranial occlusion or high-grade stenosis. 3. Perfusion data analysis shows diffuse right hemispheric ischemia without a core infarct. In the context of occluded right internal carotid and vertebral arteries, the elevated Tmax values might be secondary to the right hemisphere being supplied by contralateral collateral flow. Findings might also indicate a hypoperfusion state. These results were called by telephone at the time of interpretation on 05/23/2018 at 7:31 pm to Dr. Amie Portland , who verbally acknowledged these results. Electronically Signed   By: Ulyses Jarred M.D.   On: 05/23/2018 19:33   Mr Brain Wo Contrast  Result Date: 05/24/2018 CLINICAL DATA:  Follow up stroke,  RIGHT-sided weakness now resolved. History of hypertension, hyperlipidemia,  dementia and stroke. EXAM: MRI HEAD WITHOUT CONTRAST TECHNIQUE: Multiplanar, multiecho pulse sequences of the brain and surrounding structures were obtained without intravenous contrast. COMPARISON:  CT HEAD May 23, 2018 and MRI of the head July 20, 2016 FINDINGS: INTRACRANIAL CONTENTS: No reduced diffusion to suggest acute ischemia. RIGHT frontoparietal to lesser extent RIGHT occipital encephalomalacia with minimal susceptibility artifact. Ex vacuo dilatation RIGHT lateral ventricle. Old small RIGHT cerebellar infarcts. Moderate parenchymal brain volume loss. No hydrocephalus. Mild RIGHT cerebral peduncle volume loss consistent with wallerian degeneration. No midline shift, mass effect or masses. No abnormal extra-axial fluid collections. VASCULAR: Loss of normal RIGHT internal carotid artery flow void. SKULL AND UPPER CERVICAL SPINE: No abnormal sellar expansion. No suspicious calvarial bone marrow signal. Craniocervical junction maintained. SINUSES/ORBITS: Mild paranasal sinus mucosal thickening without air-fluid levels. Mastoid air cells are well aerated.The included ocular globes and orbital contents are non-suspicious. OTHER: None. IMPRESSION: 1. No acute intracranial process. 2. Multifocal old RIGHT MCA and posterior border zone territory infarcts. 3. RIGHT ICA occlusion. 4. Moderate parenchymal brain volume loss. Electronically Signed   By: Elon Alas M.D.   On: 05/24/2018 04:13   Ct Cerebral Perfusion W Contrast  Result Date: 05/23/2018 CLINICAL DATA:  Altered mental status.  Focal neurologic deficit. EXAM: CT ANGIOGRAPHY HEAD AND NECK CT PERFUSION BRAIN TECHNIQUE: Multidetector CT imaging of the head and neck was performed using the standard protocol during bolus administration of intravenous contrast. Multiplanar CT image reconstructions and MIPs were obtained to evaluate the vascular anatomy. Carotid stenosis measurements (when applicable) are obtained utilizing NASCET criteria, using  the distal internal carotid diameter as the denominator. Multiphase CT imaging of the brain was performed following IV bolus contrast injection. Subsequent parametric perfusion maps were calculated using RAPID software. CONTRAST:  137mL ISOVUE-370 IOPAMIDOL (ISOVUE-370) INJECTION 76% COMPARISON:  Head CT 05/23/2018 FINDINGS: CTA NECK FINDINGS SKELETON: There is no bony spinal canal stenosis. No lytic or blastic lesion. OTHER NECK: Normal pharynx, larynx and major salivary glands. No cervical lymphadenopathy. Unremarkable thyroid gland. UPPER CHEST: No pneumothorax or pleural effusion. No nodules or masses. AORTIC ARCH: There is no calcific atherosclerosis of the aortic arch. There is no aneurysm, dissection or hemodynamically significant stenosis of the visualized ascending aorta and aortic arch. Conventional 3 vessel aortic branching pattern. The visualized proximal subclavian arteries are widely patent. RIGHT CAROTID SYSTEM: --Common carotid artery: Widely patent origin without common carotid artery dissection or aneurysm. 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 No reconstitution to the skull base. Compared to the CTA from 07/18/2016 the occlusion has worsened from a previously severe stenosis. --External carotid artery: No acute abnormality. LEFT CAROTID SYSTEM: --Common carotid artery: Widely patent origin without common carotid artery dissection or aneurysm. --Internal carotid artery: Normal without aneurysm, dissection or stenosis. --External carotid artery: No acute abnormality. VERTEBRAL ARTERIES: Left dominant configuration. Left vertebral artery origin is normal. The right vertebral artery is occluded from its origin to the vertebrobasilar confluence, unchanged. CTA HEAD FINDINGS POSTERIOR CIRCULATION: --Basilar artery: Normal. --Posterior cerebral arteries: Diminished flow in the right PCA, which is predominantly supplied by the right vertebral artery. Left PCA is normal. Small  left P-comm. --Superior cerebellar arteries: Normal. --Inferior cerebellar arteries: Right PICA is opacified via collateral flow across the vertebrobasilar confluence. ANTERIOR CIRCULATION: --Intracranial internal carotid arteries: No flow in the right ICA. Mild calcification along the course of the left ICA. --Anterior cerebral arteries: Normal. Both A1 segments are present. Patent  anterior communicating artery. --Middle cerebral arteries: Normal. --Posterior communicating arteries: Present bilaterally. VENOUS SINUSES: Minimal opacification due to contrast timing. ANATOMIC VARIANTS: None DELAYED PHASE: Not performed. Review of the MIP images confirms the above findings. CT Brain Perfusion Findings: CBF (<30%) Volume: 50mL Perfusion (Tmax>6.0s) volume: 367mL Mismatch Volume: 375mL Commentary: Entire right hemisphere is labeled as ischemic penumbra by computerized data analysis. However, this may be due to reduced or delayed perfusion of the right hemisphere, which is supplied entirely by collateral flow, as the vertebral and internal carotid arteries on the right are both occluded. IMPRESSION: 1. 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 The right vertebral occlusion is unchanged 07/18/2016. The right ICA occlusion has progressed from severe stenosis. 2. Poor opacification of the distal right PCA, likely occluded or severely stenotic. No other intracranial occlusion or high-grade stenosis. 3. Perfusion data analysis shows diffuse right hemispheric ischemia without a core infarct. In the context of occluded right internal carotid and vertebral arteries, the elevated Tmax values might be secondary to the right hemisphere being supplied by contralateral collateral flow. Findings might also indicate a hypoperfusion state. These results were called by telephone at the time of interpretation on 05/23/2018 at 7:31 pm to Dr. Amie Portland , who verbally acknowledged these  results. Electronically Signed   By: Ulyses Jarred M.D.   On: 05/23/2018 19:33   Dg Chest Portable 1 View  Result Date: 05/23/2018 CLINICAL DATA:  Acute onset of RIGHT facial droop and rightward gaze while eating dinner earlier this evening. EXAM: PORTABLE CHEST 1 VIEW COMPARISON:  07/16/2016 and earlier. FINDINGS: Cardiac silhouette upper normal in size to slightly enlarged for AP portable technique, unchanged. Lungs clear. Bronchovascular markings normal. Pulmonary vascularity normal. No visible pleural effusions. No pneumothorax. IMPRESSION: Stable borderline heart size. No acute cardiopulmonary disease. Electronically Signed   By: Evangeline Dakin M.D.   On: 05/23/2018 20:13   Ct Head Code Stroke Wo Contrast  Result Date: 05/23/2018 CLINICAL DATA:  Code stroke. Altered mental status. Last seen normal 17:55 EXAM: CT HEAD WITHOUT CONTRAST TECHNIQUE: Contiguous axial images were obtained from the base of the skull through the vertex without intravenous contrast. COMPARISON:  CTA head neck 07/18/2016 FINDINGS: Brain: There is no mass, hemorrhage or extra-axial collection. The size and configuration of the ventricles and extra-axial CSF spaces are normal. There is hypoattenuation in the right parietal lobe with ex vacuo dilatation of the posterior right lateral ventricle, likely an old infarct. Vascular: No abnormal hyperdensity of the major intracranial arteries or dural venous sinuses. No intracranial atherosclerosis. Skull: The visualized skull base, calvarium and extracranial soft tissues are normal. Sinuses/Orbits: No fluid levels or advanced mucosal thickening of the visualized paranasal sinuses. No mastoid or middle ear effusion. The orbits are normal. ASPECTS [facility] Stroke Program Early CT Score) - Ganglionic level infarction (caudate, lentiform nuclei, internal capsule, insula, M1-M3 cortex): 7 - Supraganglionic infarction (M4-M6 cortex): 3 Total score (0-10 with 10 being normal): 10 IMPRESSION: 1.  No acute hemorrhage. 2. Hypoattenuation in the right parietal lobe, suspected to be an old infarct. 3. ASPECTS is 10. * These results were communicated to Dr. Amie Portland at 6:59 pm on 05/23/2018 by text page via the [facility] messaging system. Electronically Signed   By: Ulyses Jarred M.D.   On: 05/23/2018 19:00    PHYSICAL EXAM  Temp:  [98.4 F (36.9 C)-99.1 F (37.3 C)] 98.5 F (36.9 C) (02/26 2000) Pulse Rate:  [30-106] 70 (02/26 2300) Resp:  [  13-22] 17 (02/26 2300) BP: (95-195)/(56-122) 160/56 (02/26 2300) SpO2:  [59 %-100 %] 98 % (02/26 2300)  General - Well nourished, well developed, in no apparent distress.  Ophthalmologic - fundi not visualized due to noncooperation.  Cardiovascular - Regular rate and rhythm.  Mental Status -  Level of arousal and orientation to time, place, and person were intact. Language including expression, naming, repetition, comprehension was assessed and found intact. Fund of Knowledge was assessed and was intact.  Cranial Nerves II - XII - II - Visual field intact OU. III, IV, VI - Extraocular movements intact. V - Facial sensation intact bilaterally. VII - Facial movement intact bilaterally. VIII - Hearing & vestibular intact bilaterally. X - Palate elevates symmetrically, mild dysarthria, poor denture. XI - Chin turning & shoulder shrug intact bilaterally. XII - Tongue protrusion intact.  Motor Strength - The patient's strength was normal in all extremities and pronator drift was absent.  Bulk was normal and fasciculations were absent.   Motor Tone - Muscle tone was assessed at the neck and appendages and was normal.  Reflexes - The patient's reflexes were symmetrical in all extremities and he had no pathological reflexes.  Sensory - Light touch, temperature/pinprick were assessed and were symmetrical.    Coordination - The patient had normal movements in the hands and feet with no ataxia or dysmetria.  Tremor was absent.  Gait and Station  - deferred.   ASSESSMENT/PLAN Mr. Dahlton Hinde is a 73 y.o. male with history of dementia, bipolar disorder, positive cancer under medical treatment, hypertension, hyperlipidemia, obesity, stroke 2 years ago admitted for unresponsive with left-sided weakness. No tPA given due to rapidly improving symptoms.    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  CT head and neck showed right ICA occluded at origin, progressed from 2 years ago which only showed occlusion at ICA siphon.  Unchanged right VA occlusion.  2D Echo pending  EEG normal  LDL 74  HgbA1c 5.1  SCDs for VTE prophylaxis  clopidogrel 75 mg daily prior to admission, now on clopidogrel 75 mg daily.   Patient counseled to be compliant with his antithrombotic medications  Ongoing aggressive stroke risk factor management  Therapy recommendations:  Pending  Disposition: Pending  Hypotension History of hypertension  BP low on admission  Currently on neo   Taper off new as able  BP goal 130-160 given right ICA and right VA occlusion  Currently off any BP meds  PTA home BP meds including clonidine, lisinopril and metoprolol  Avoid low BP  Right ICA occlusion  In 06/2016 CTA showed right ICA occlusion at ophthalmic level with distal reconstitution  This admission CTA showed right ICA occlusion at bifurcation, progressed from 06/2016  BP goal 130-160  Avoid low BP  History of stroke  06/2016 admitted for a fall and difficulty walking.  Also had a cough and fever due to left lower lobe PNA.  MRI showed right MCA/ACA, MCA/PCA infarcts.  CTA head neck showed right ICA occlusion at the ophthalmic level with distal reconstitution, right P1 stenosis and right VA occlusion.  IR also confirmed right ICA occlusion, no intervention offered.  LDL 86 and A1c 5.4, patient discharged  with DAPT for 3 months and Lipitor 80.  Hyperlipidemia  Home meds: Lipitor 80  LDL 74, goal < 70  Now on Lipitor 80  Continue  statin at discharge  Other Stroke Risk Factors  Advanced age  Obesity, Body mass index is 32.11 kg/m.   Other Active Problems  Bipolar disorder  Positive cancer  Dementia  Hospital day # 1  This patient is critically ill due to TIA, right ICA occlusion, hypotension, history of stroke and at significant risk of neurological worsening, death form recurrent stroke, hypovolemic shock, heart failure, seizure. This patient's care requires constant monitoring of vital signs, hemodynamics, respiratory and cardiac monitoring, review of multiple databases, neurological assessment, discussion with family, other specialists and medical decision making of high complexity. I spent 35 minutes of neurocritical care time in the care of this patient.   [name], MD PhD Stroke Neurology 05/24/2018 11:36 PM    To contact Stroke Continuity provider, please refer to [email or web address] After hours, contact General Neurology

## 2018-05-24 NOTE — Progress Notes (Signed)
Hypoglycemic Event  CBG: 68  Treatment: 50% Dextrose 12.5g/38mL  Symptoms: N/A  Follow-up CBG: Time: 0021 CBG Result: [place], RN

## 2018-05-24 NOTE — Evaluation (Addendum)
Occupational Therapy Evaluation Patient Details Name: Ronald [name] MRN: [id number] DOB: [birth year] Today's Date: 05/24/2018    History of Present Illness 73 yo male admitted after sudden onset of unresponsiveness on 2/25, CT and MRI showing no acute stroke. 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  Admitted for above and limited by problem list below.  Patient requires min guard for basic transfers, min guard for grooming at sink, min assist for LB ADLs, and supervision for UB ADLs. BP sitting EOB: 178/68, BP after mobility: 183/72 with nursing notified. Patient has hx of dementia, as is impulsive at times but anticipate this is baseline.  He has 24/7 support at home.  Will follow acutely, but anticipate no further needs after dc.      Follow Up Recommendations  No OT follow up    Equipment Recommendations  None recommended by OT    Recommendations for Other Services       Precautions / Restrictions Precautions Precautions: Fall Restrictions Weight Bearing Restrictions: No      Mobility Bed Mobility Overal bed mobility: Needs Assistance Bed Mobility: Supine to Sit     Supine to sit: Supervision;HOB elevated     General bed mobility comments: pt able to sit EOB with supervision and management of lines/leads and increased time  Transfers Overall transfer level: Needs assistance Equipment used: Rolling walker (2 wheeled) Transfers: Sit to/from Stand Sit to Stand: Min guard         General transfer comment: min guard for safety and management of lines/leads    Balance Overall balance assessment: Mild deficits observed, not formally tested                                         ADL either performed or assessed with clinical judgement   ADL  Overall ADL's : Needs assistance/impaired     Grooming: Wash/dry hands;Wash/dry face;Min guard;Sitting   Upper Body Bathing: Set up;Sitting   Lower Body Bathing: Sit to/from stand;Minimal assistance   Upper Body Dressing : Set up;Sitting   Lower Body Dressing: Sit to/from stand;Minimal assistance   Toilet Transfer: Ambulation;RW;Min guard Toilet Transfer Details (indicate cue type and reason): simulated to recliner          Functional mobility during ADLs: Rolling walker;Min guard       Vision Baseline Vision/History: Wears glasses Wears Glasses: Reading only Patient Visual Report: No change from baseline Vision Assessment?: No apparent visual deficits     Perception     Praxis      Pertinent Vitals/Pain Pain Assessment: Faces Faces Pain Scale: No hurt Pain Intervention(s): Monitored during session     Hand Dominance Right   Extremity/Trunk Assessment Upper Extremity Assessment Upper Extremity Assessment: LUE deficits/detail LUE Deficits / Details: grossly 4/5 (compared to 5/5 R side) from prior CVA  LUE Coordination: decreased fine motor   Lower Extremity Assessment Lower Extremity Assessment: Defer to PT evaluation       Communication Communication Communication: No difficulties   Cognition Arousal/Alertness: Awake/alert Behavior During Therapy: WFL for tasks assessed/performed Overall Cognitive Status: History of cognitive impairments - at baseline  General Comments: cueing for safety awareness, impuslive at times but anticipate baseline    General Comments  BP assessed throughout session    Exercises     Shoulder Instructions      Home Living Family/patient expects to be discharged to:: Private residence Living Arrangements: Other relatives Available Help at Discharge: Family;Available 24 hours/day Type of Home: House Home Access: Level entry     Home Layout: Able to live on main level with  bedroom/bathroom     Bathroom Shower/Tub: Teacher, early years/pre: Standard     Home Equipment: Environmental consultant - 2 wheels;Cane - single point;Wheelchair - manual;Tub bench      Lives With: Family;Other (Comment)(his brother)    Prior Functioning/Environment Level of Independence: Independent        Comments: reports using RW/cane, independent ADLs/ iADLs         OT Problem List: Decreased strength;Decreased activity tolerance;Impaired balance (sitting and/or standing);Decreased safety awareness;Decreased knowledge of use of DME or AE      OT Treatment/Interventions: Self-care/ADL training;Therapeutic exercise;Balance training;Patient/family education;Therapeutic activities;Energy conservation;DME and/or AE instruction;Neuromuscular education    OT Goals(Current goals can be found in the care plan section) Acute Rehab OT Goals Patient Stated Goal: return home OT Goal Formulation: With patient Time For Goal Achievement: 06/07/18 Potential to Achieve Goals: Good  OT Frequency: Min 2X/week   Barriers to D/C:            Co-evaluation PT/OT/SLP Co-Evaluation/Treatment: Yes Reason for Co-Treatment: Necessary to address cognition/behavior during functional activity;For patient/therapist safety;To address functional/ADL transfers PT goals addressed during session: Mobility/safety with mobility;Balance;Proper use of DME;Strengthening/ROM OT goals addressed during session: ADL's and self-care;Other (comment)(mobility)      AM-PAC OT "6 Clicks" Daily Activity     Outcome Measure Help from another person eating meals?: None Help from another person taking care of personal grooming?: A Little Help from another person toileting, which includes using toliet, bedpan, or urinal?: A Little Help from another person bathing (including washing, rinsing, drying)?: A Little Help from another person to put on and taking off regular upper body clothing?: None Help from another person to  put on and taking off regular lower body clothing?: A Little 6 Click Score: 20   End of Session Equipment Utilized During Treatment: Gait belt;Rolling walker Nurse Communication: Mobility status;Other (comment)(BP)  Activity Tolerance: Patient tolerated treatment well Patient left: in chair;with call bell/phone within reach;with chair alarm set;with family/visitor present  OT Visit Diagnosis: Muscle weakness (generalized) (M62.81)                Time: 0355-9741 OT Time Calculation (min): 31 min Charges:  OT General Charges $OT Visit: 1 Visit OT Evaluation $OT Eval Moderate Complexity: [place], [place] Pager [phone] Office [phone]    Delight Stare 05/24/2018, 2:13 PM

## 2018-05-24 NOTE — Progress Notes (Signed)
Chaplain responded to spiritual consult.  Patient requests prayer.  Chaplain met patient's sister and niece outside of room.  Patient was working with PT so family was waiting. Chaplain will visit at another time today. Tamsen Snider Pager [phone]

## 2018-05-24 NOTE — Progress Notes (Signed)
NAME:  Ronald [name], MRN:  [id number], DOB:  1945-10-30, LOS: 1 ADMISSION DATE:  05/23/2018, CONSULTATION DATE:  05/23/2018 REFERRING MD:  Dr. Rory Percy, CHIEF COMPLAINT:  AMS  Brief History   50 yoM presenting from home as code stroke for episode of unresponsiveness.  LSW 1755.  Found unresponsive with right upward gaze, urinary incontinence, and required brief BVM.  Upon awakening noted to have some right sided weakness which has since resolved.   CTH/ CTA without findings for acute stroke, showing chronic ICA occlusion, slight worse than prior.  Noted to be hypotensive and bradycardic at times, now requiring neosynephrine.  Thought that hypoperfusion is contributing to poor cerebral perfusion.  Neurology admitting, PCCM consulted to assist in blood pressure management.  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    LSW at 1755 while eating dinner with family. Patient had sudden onset of unresponsiveness.  No reported seizure activity.  Found by EMS with right upward gaze with urinary incontinence.  He required brief BVM until patient started to wake up.  Noted to have some right sided weakness which has since resolved and patient fully alert in ER.   CT head and CTA head/ neck/ perfusion study with no new acute findings, noted old right watershed cerebral strokes and chronic right ICA occlusion.  He was noted to have some hypotension w/ SBP in the 70's and bradycardia in the 50's in the ER, s/p 1.5L.  Had additional episode of brief unresponsiveness in ER with hypoxia with upward gaze.  PCCM consulted as Neurology would like SBP goal for cerebral perfusion 140-180.    Past Medical History  Dementia, depression, hypertension, hyperlipidemia, schizophrenia, prior stroke involving the  right carotid watershed areas with residual left sided weakness with chronic ICA occlusion  Significant Hospital Events   2/25 Admitted >>  Consults:   Procedures:   Significant Diagnostic Tests:  2/25 CTH >> No acute hemorrhage. Hypoattenuation in the right parietal lobe, suspected to be an old infarct.  2/25 CTA head/ neck/ perfusion >> Occlusion of the right internal carotid and vertebral arteries along their entire lengths. The right vertebral occlusion is unchanged 07/18/2016. The right ICA occlusion has progressed from severe stenosis. Poor opacification of the distal right PCA, likely occluded or severely stenotic. No other intracranial occlusion or high-grade stenosis. Perfusion data analysis shows diffuse right hemispheric ischemia without a core infarct. In the context of occluded right internal carotid and vertebral arteries, the elevated Tmax values might be secondary to the right hemisphere being supplied by contralateral collateral flow. Findings might also indicate a hypoperfusion state. MRI brain 2/26 > No acute intracranial process. Multifocal old RIGHT MCA and posterior border zone territory infarcts. RIGHT ICA occlusion. Moderate parenchymal brain volume loss.  Micro Data:   Antimicrobials:   Interim history/subjective:  No acute events overnight. Weaning off pressors, Still remains on low dose phenylephrine.   Objective   Blood pressure (!) 143/68, pulse (!) 54, temperature 98.4 F (36.9 C), temperature source Oral, resp. rate 17, height 5\' 7"  (1.702 m), weight 93 kg, SpO2 100 %.        Intake/Output Summary (Last 24 hours) at 05/24/2018 2841 Last data filed at 05/24/2018 0600 Gross per 24 hour  Intake 1056.11 ml  Output 2150 ml  Net -1093.89 ml   [employer]  05/23/18 1909 05/23/18 2031  Weight: 96.3 kg 93 kg    Examination: General:  Elderly male lying in bed in NAD HEENT: Poor dentition. PERRL, no JVD Neuro: Awake, oriented x 3. Very talkative.  CV:  s1s2 rrr, no murmur PULM: even/non-labored, lungs bilaterally clear, on room air 99% GI: soft, non-tender, bs active  Extremities: warm/dry, no LE edema  Skin: no rashes   Resolved Hospital Problem list    Assessment & Plan:   Acute encephalopathy with symptoms concerning for seizure vs stroke -no acute finding on CTH/CTA/MRI, noted old right watershed cerebral strokes and chronic right ICA occlusion. Did have one episode of hypoglycemia overnight. Question importance of this as he had not been eating.  P:  Neurology primary Neosynephrine for Goal SBP 140-180 mmHg Seizure precautions Holding off on AEDs Continue to monitor CBG EEG pending  Hypoxia w/ concern for aspiration - hypoxia correlates with periods of unresponsiveness/ ?seizure - CXR without acute infiltrate, no reported infectious symptoms, afebrile, normal WBC P:  Supplemental O2 for sats >94%, currently on room air with sats 99% Monitor clinically for now Flu pending  Hypotension Hx HTN/ HLD P:  Tele monitoring Peripheral neosynephrine for goal SBP 140- 180 (for neuro goals) Hold home lopressor, lisinopril, clonidine Continue lipitor, plavix Echo pending.   AKI and s/p IV contrast improved Hyponatremia: improved Hyperkalemia: improved P:  Now taking PO will stop NS maintenance  Trend BMP / urinary output  Hyperglycemia/Hypoglycemia P:  SSI cbg q 4 Hypoglycemia protocol in place  Normocytic anemia - chronic, stable Hgb at baseline.  P:  Trend CBC  Best practice:  Diet: NPO Pain/Anxiety/Delirium protocol (if indicated): n/a VAP protocol (if indicated): n/a DVT prophylaxis: heparin SQ GI prophylaxis: n/a Glucose control: SSI Mobility: Up with assist Code Status: Full  Family Communication: patient updated at bedside on plan of care Disposition: ICU   Labs   CBC: Recent Labs  Lab 05/23/18 1840 05/24/18 0008  WBC 6.7 6.7  NEUTROABS 4.4  --   HGB 11.0* 10.8*  HCT 36.8* 36.2*  MCV 92.0  91.2  PLT 289 076    Basic Metabolic Panel: Recent Labs  Lab 05/23/18 1840 05/23/18 1848 05/24/18 0008  NA 132*  --  138  K 5.5*  --  4.3  CL 103  --  107  CO2 17*  --  23  GLUCOSE 155*  --  148*  BUN 33*  --  25*  CREATININE 1.51* 1.60* 1.04  CALCIUM 9.0  --  8.6*  MG  --   --  2.0  PHOS  --   --  3.0   GFR: Estimated Creatinine Clearance: 70.9 mL/min (by C-G formula based on SCr of 1.04 mg/dL). Recent Labs  Lab 05/23/18 1840 05/23/18 2120 05/24/18 0008  WBC 6.7  --  6.7  LATICACIDVEN  --  1.4 1.0    Liver Function Tests: Recent Labs  Lab 05/23/18 1840 05/24/18 0008  AST 42*  --   ALT 26  --   ALKPHOS 86  --   BILITOT 1.7*  --   PROT 6.0*  --   ALBUMIN 3.4* 3.1*   No results for input(s): LIPASE, AMYLASE in the last 168 hours. No results for input(s): AMMONIA in the last 168 hours.  ABG No results found for: PHART, PCO2ART, PO2ART, HCO3, TCO2, ACIDBASEDEF, O2SAT   Coagulation Profile: Recent Labs  Lab 05/23/18 1840  INR 1.2    Cardiac Enzymes: Recent Labs  Lab 05/24/18 0008  TROPONINI <  0.03    HbA1C: Hgb A1c MFr Bld  Date/Time Value Ref Range Status  05/24/2018 12:08 AM 5.1 4.8 - 5.6 % Final    Comment:    (NOTE) Pre diabetes:          5.7%-6.4% Diabetes:              >6.4% Glycemic control for   <7.0% adults with diabetes   07/16/2016 10:17 AM 5.4 4.8 - 5.6 % Final    Comment:    (NOTE)         Pre-diabetes: 5.7 - 6.4         Diabetes: >6.4         Glycemic control for adults with diabetes: <7.0     CBG: Recent Labs  Lab 05/23/18 1840 05/23/18 2357 05/24/18 0025 05/24/18 0416  GLUCAP 138* 68* 95 85    Critical care time:     Georgann Housekeeper, AGACNP-BC [facility] Pager [phone] or [phone]  05/24/2018 8:22 AM

## 2018-05-24 NOTE — Progress Notes (Addendum)
MRI brain shows no acute stroke. Likely hypoperfusion and recrudescence of old symptoms in setting of RICA occlusion. Maintain permissive HTN as recommended in consult note. Seizure remains in differential.  Get EEG. No AEDs for now. Stroke team to follow.  -- Amie [place], MD [facility] Pager: [phone] If 7pm to 7am, please call on call as listed on AMION.

## 2018-05-25 ENCOUNTER — Inpatient Hospital Stay (HOSPITAL_COMMUNITY): Payer: Medicare HMO

## 2018-05-25 DIAGNOSIS — I10 Essential (primary) hypertension: Secondary | ICD-10-CM

## 2018-05-25 DIAGNOSIS — E785 Hyperlipidemia, unspecified: Secondary | ICD-10-CM

## 2018-05-25 DIAGNOSIS — I639 Cerebral infarction, unspecified: Secondary | ICD-10-CM

## 2018-05-25 LAB — GLUCOSE, CAPILLARY
GLUCOSE-CAPILLARY: 91 mg/dL (ref 70–99)
Glucose-Capillary: 89 mg/dL (ref 70–99)
Glucose-Capillary: 132 mg/dL — ABNORMAL HIGH (ref 70–99)
Glucose-Capillary: 89 mg/dL (ref 70–99)

## 2018-05-25 LAB — BASIC METABOLIC PANEL
Anion gap: 7 (ref 5–15)
BUN: 12 mg/dL (ref 8–23)
CO2: 26 mmol/L (ref 22–32)
Calcium: 8.9 mg/dL (ref 8.9–10.3)
Chloride: 106 mmol/L (ref 98–111)
Creatinine, Ser: 0.79 mg/dL (ref 0.61–1.24)
GFR calc Af Amer: >60 mL/min (ref >60)
GFR calc non Af Amer: >60 mL/min (ref >60)
Glucose, Bld: 104 mg/dL — ABNORMAL HIGH (ref 70–99)
POTASSIUM: 4.1 mmol/L (ref 3.5–5.1)
Sodium: 139 mmol/L (ref 135–145)

## 2018-05-25 LAB — CBC
HCT: 36.9 % — ABNORMAL LOW (ref 39.0–52.0)
Hemoglobin: 11.3 g/dL — ABNORMAL LOW (ref 13.0–17.0)
MCH: 28 pg (ref 26.0–34.0)
MCHC: 30.6 g/dL (ref 30.0–36.0)
MCV: 91.3 fL (ref 80.0–100.0)
Platelets: 239 10*3/uL (ref 150–400)
RBC: 4.04 MIL/uL — ABNORMAL LOW (ref 4.22–5.81)
RDW: 13.9 % (ref 11.5–15.5)
WBC: 5.8 10*3/uL (ref 4.0–10.5)
nRBC: 0 % (ref 0.0–0.2)

## 2018-05-25 LAB — ECHOCARDIOGRAM COMPLETE
Height: 67 in
Weight: 3280.44 oz

## 2018-05-25 MED ORDER — LABETALOL HCL 5 MG/ML IV SOLN
INTRAVENOUS | Status: AC
  Administered 2018-05-25: 10 mg
  Filled 2018-05-25: qty 4

## 2018-05-25 MED ORDER — LISINOPRIL 10 MG PO TABS
10.0000 mg | ORAL_TABLET | Freq: Every day | ORAL | Status: DC
  Administered 2018-05-25 – 2018-05-26 (×2): 10 mg via ORAL
  Filled 2018-05-25 (×2): qty 1

## 2018-05-25 MED ORDER — LABETALOL HCL 5 MG/ML IV SOLN
10.0000 mg | INTRAVENOUS | Status: DC | PRN
  Administered 2018-05-25: 10 mg via INTRAVENOUS
  Filled 2018-05-25: qty 4

## 2018-05-25 NOTE — ED Provider Notes (Signed)
[facility] PROGRESSIVE CARE Provider Note   CSN: [id number] Arrival date & time: 05/23/18  5681    History   Chief Complaint Chief Complaint  Patient presents with  . Code Stroke    HPI Ronald [name] is a 73 y.o. male.     HPI  73 y.o. male who has a past medical history of dementia, depression, hypertension, hyperlipidemia, schizophrenia, stroke with some residual left-sided weakness, presents to the emergency room with concerns for acute stroke.  Patient was eating dinner with family around 5:55 PM had a sudden onset of unresponsiveness.  They did not report any seizure-like activity. When EMS arrived, he had a rightward gaze and was very lethargic and not following commands.   Patient's mental status has since improved. He is able to provide coherent history and reports that he was feeling " weird" and unwell while he was sitting at the dinner table.  Denies any history of seizures.  Denies any recent illnesses or sicknesses including fevers chills.  Denies any chest pain shortness of breath palpitations nausea vomiting abdominal pain diarrhea constipation.   Past Medical History:  Diagnosis Date  . Cancer [facility])    Prostate  . Chicken pox   . Dementia ([place])   . Depression   . Heart disease   . Hyperlipemia   . Hypertension   . Measles   . Mumps   . 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 Stroke [facility])     Patient Active Problem List   Diagnosis Date Noted  . Cerebral hypoperfusion 05/23/2018  . 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 Cerebral thrombosis with cerebral infarction 07/20/2016  . Non-traumatic rhabdomyolysis   . Bronchitis 07/15/2016  . Dementia ([place]) 07/15/2016  . CAP (community acquired pneumonia) 07/15/2016  . Acute left hemiparesis ([place]) 07/15/2016  . Abnormal urinalysis 07/15/2016  . Effusion of left knee 07/15/2016  . Elevated AST (SGOT) 07/15/2016  . Elevated troponin  07/15/2016  . Lobar pneumonia ([place])   . Prostate cancer ([place]) 10/21/2015  . Other seasonal allergic rhinitis 10/21/2015  . Essential hypertension 01/16/2014  . Bipolar 1 disorder ([place]) 08/02/2013  . GERD (gastroesophageal reflux disease) 07/04/2013  . Elevated PSA, less than 10 ng/ml 07/04/2013  . BPH with elevated PSA 02/28/2013  . Encounter for Medicare annual wellness exam 02/28/2013  . Hyperlipidemia 02/28/2013    Past Surgical History:  Procedure Laterality Date  . IR ANGIO EXTERNAL CAROTID SEL EXT CAROTID UNI R MOD SED  07/20/2016  . IR ANGIO INTRA EXTRACRAN SEL INTERNAL CAROTID BILAT MOD SED  07/20/2016  . IR ANGIO VERTEBRAL SEL SUBCLAVIAN INNOMINATE UNI L MOD SED  07/20/2016  . IR ANGIOGRAM EXTREMITY RIGHT  07/20/2016        Home Medications    Prior to Admission medications   Medication Sig Start Date End Date Taking? Authorizing Provider  atorvastatin (LIPITOR) 80 MG tablet Take 80 mg by mouth daily. 01/23/18  Yes [provider]  Calcium-Vitamin D 600-200 MG-UNIT per tablet Take 1 tablet by mouth daily.   Yes [provider]  cloNIDine (CATAPRES) 0.1 MG tablet Take 1 tablet (0.1 mg total) by mouth 3 (three) times daily. 07/21/16  Yes Jani Gravel, MD  clopidogrel (PLAVIX) 75 MG tablet Take 1 tablet (75 mg total) by mouth daily. 07/22/16  Yes Jani Gravel, MD  finasteride (PROSCAR) 5 MG tablet Take 1 tablet (5 mg total) by mouth daily. 07/22/16  Yes  Jani Gravel, MD  fluticasone [facility]) 50 MCG/ACT nasal spray Place 1 spray into both nostrils daily.   Yes [provider]  lisinopril (PRINIVIL,ZESTRIL) 30 MG tablet TAKE 1 TABLET (30 MG TOTAL) BY MOUTH DAILY. 05/28/16  Yes Brunetta Jeans, PA-C  metoprolol (LOPRESSOR) 50 MG tablet TAKE 1 TABLET BY MOUTH TWICE A DAY Patient taking differently: Take 50 mg by mouth 2 (two) times daily.  05/28/16  Yes Brunetta Jeans, PA-C  ARIPiprazole (ABILIFY) 10 MG tablet TAKE 1 TABLET BY MOUTH EVERY DAY Patient not  taking: Reported on 05/24/2018 01/16/16   Brunetta Jeans, PA-C  atorvastatin (LIPITOR) 20 MG tablet Take 1 tablet (20 mg total) by mouth daily at 6 PM. Patient not taking: Reported on 05/24/2018 09/21/16   Melvenia Beam, MD    Family History Family History  Problem Relation Age of Onset  . Hypertension Father   . Hyperlipidemia Father   . Congestive Heart Failure Father 41       Deceased-1995  . Diabetes Mother   . Kidney failure Mother 7       Deceased-2005  . Hypertension Mother   . Congestive Heart Failure Maternal Grandmother   . Heart failure Paternal Grandmother   . Heart attack Paternal Grandmother     Social History Social History   Tobacco Use  . Smoking status: Never Smoker  . Smokeless tobacco: Never Used  Substance Use Topics  . Alcohol use: No  . Drug use: No     Allergies   Dust mite extract; Pollen extract; and Rye grass flower pollen extract [gramineae pollens]   Review of Systems Review of Systems  Unable to perform ROS: Mental status change     Physical Exam Updated Vital Signs BP (!) 156/73 (BP Location: Left Arm)   Pulse 69   Temp 98.2 F (36.8 C) (Oral)   Resp 18   Ht 5\' 7"  (1.702 m)   Wt 93 kg   SpO2 95%   BMI 32.11 kg/m   Physical Exam Vitals signs and nursing note reviewed.  Constitutional:      Appearance: He is well-developed.  HENT:     Head: Atraumatic.  Neck:     Musculoskeletal: Neck supple.  Cardiovascular:     Rate and Rhythm: Normal rate.  Pulmonary:     Effort: Pulmonary effort is normal.  Skin:    General: Skin is warm.  Neurological:     Mental Status: He is alert.     Comments: Left-sided facial droop and drift for the upper extremity.  There is no drift in the left lower extremity but it also appears weaker compared to the right side.  Patient has mild gait abnormality      ED Treatments / Results  Labs (all labs ordered are listed, but only abnormal results are displayed) Labs Reviewed  CBC -  Abnormal; Notable for the following components:      Result Value   RBC 4.00 (*)    Hemoglobin 11.0 (*)    HCT 36.8 (*)    MCHC 29.9 (*)    All other components within normal limits  COMPREHENSIVE METABOLIC PANEL - Abnormal; Notable for the following components:   Sodium 132 (*)    Potassium 5.5 (*)    CO2 17 (*)    Glucose, Bld 155 (*)    BUN 33 (*)    Creatinine, Ser 1.51 (*)    Total Protein 6.0 (*)    Albumin 3.4 (*)  AST 42 (*)    Total Bilirubin 1.7 (*)    GFR calc non Af Amer 46 (*)    GFR calc Af Amer 53 (*)    All other components within normal limits  LIPID PANEL - Abnormal; Notable for the following components:   HDL 34 (*)    All other components within normal limits  URINALYSIS, ROUTINE W REFLEX MICROSCOPIC - Abnormal; Notable for the following components:   Color, Urine STRAW (*)    All other components within normal limits  CBC - Abnormal; Notable for the following components:   RBC 3.97 (*)    Hemoglobin 10.8 (*)    HCT 36.2 (*)    MCHC 29.8 (*)    All other components within normal limits  RENAL FUNCTION PANEL - Abnormal; Notable for the following components:   Glucose, Bld 148 (*)    BUN 25 (*)    Calcium 8.6 (*)    Albumin 3.1 (*)    All other components within normal limits  GLUCOSE, CAPILLARY - Abnormal; Notable for the following components:   Glucose-Capillary 68 (*)    All other components within normal limits  GLUCOSE, CAPILLARY - Abnormal; Notable for the following components:   Glucose-Capillary 136 (*)    All other components within normal limits  GLUCOSE, CAPILLARY - Abnormal; Notable for the following components:   Glucose-Capillary 100 (*)    All other components within normal limits  URINALYSIS, ROUTINE W REFLEX MICROSCOPIC - Abnormal; Notable for the following components:   Color, Urine COLORLESS (*)    Specific Gravity, Urine 1.003 (*)    All other components within normal limits  CBC - Abnormal; Notable for the following  components:   RBC 4.04 (*)    Hemoglobin 11.3 (*)    HCT 36.9 (*)    All other components within normal limits  BASIC METABOLIC PANEL - Abnormal; Notable for the following components:   Glucose, Bld 104 (*)    All other components within normal limits  GLUCOSE, CAPILLARY - Abnormal; Notable for the following components:   Glucose-Capillary 106 (*)    All other components within normal limits  GLUCOSE, CAPILLARY - Abnormal; Notable for the following components:   Glucose-Capillary 132 (*)    All other components within normal limits  CBG MONITORING, ED - Abnormal; Notable for the following components:   Glucose-Capillary 138 (*)    All other components within normal limits  I-STAT CREATININE, ED - Abnormal; Notable for the following components:   Creatinine, Ser 1.60 (*)    All other components within normal limits  MRSA PCR SCREENING  PROTIME-INR  APTT  DIFFERENTIAL  HEMOGLOBIN A1C  INFLUENZA PANEL BY PCR (TYPE A & B)  LACTIC ACID, PLASMA  LACTIC ACID, PLASMA  TROPONIN I  MAGNESIUM  GLUCOSE, CAPILLARY  GLUCOSE, CAPILLARY  GLUCOSE, CAPILLARY  RAPID URINE DRUG SCREEN, HOSP PERFORMED  GLUCOSE, CAPILLARY  GLUCOSE, CAPILLARY  I-STAT TROPONIN, ED    EKG EKG Interpretation  Date/Time:  Tuesday May 23 2018 19:06:41 EST Ventricular Rate:  64 PR Interval:    QRS Duration: 90 QT Interval:  415 QTC Calculation: 429 R Axis:   83 Text Interpretation:  Sinus rhythm Borderline right axis deviation Similar to prior, no STEMI Confirmed by Antony Blackbird [phone]) on 05/24/2018 10:35:59 AM   Radiology Ct Angio Head W Or Wo Contrast  Result Date: 05/23/2018 CLINICAL DATA:  Altered mental status.  Focal neurologic deficit. EXAM: CT ANGIOGRAPHY HEAD AND NECK CT PERFUSION BRAIN  TECHNIQUE: Multidetector CT imaging of the head and neck was performed using the standard protocol during bolus administration of intravenous contrast. Multiplanar CT image reconstructions and MIPs were  obtained to evaluate the vascular anatomy. Carotid stenosis measurements (when applicable) are obtained utilizing NASCET criteria, using the distal internal carotid diameter as the denominator. Multiphase CT imaging of the brain was performed following IV bolus contrast injection. Subsequent parametric perfusion maps were calculated using RAPID software. CONTRAST:  142mL ISOVUE-370 IOPAMIDOL (ISOVUE-370) INJECTION 76% COMPARISON:  Head CT 05/23/2018 FINDINGS: CTA NECK FINDINGS SKELETON: There is no bony spinal canal stenosis. No lytic or blastic lesion. OTHER NECK: Normal pharynx, larynx and major salivary glands. No cervical lymphadenopathy. Unremarkable thyroid gland. UPPER CHEST: No pneumothorax or pleural effusion. No nodules or masses. AORTIC ARCH: There is no calcific atherosclerosis of the aortic arch. There is no aneurysm, dissection or hemodynamically significant stenosis of the visualized ascending aorta and aortic arch. Conventional 3 vessel aortic branching pattern. The visualized proximal subclavian arteries are widely patent. RIGHT CAROTID SYSTEM: --Common carotid artery: Widely patent origin without common carotid artery dissection or aneurysm. 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 No reconstitution to the skull base. Compared to the CTA from 07/18/2016 the occlusion has worsened from a previously severe stenosis. --External carotid artery: No acute abnormality. LEFT CAROTID SYSTEM: --Common carotid artery: Widely patent origin without common carotid artery dissection or aneurysm. --Internal carotid artery: Normal without aneurysm, dissection or stenosis. --External carotid artery: No acute abnormality. VERTEBRAL ARTERIES: Left dominant configuration. Left vertebral artery origin is normal. The right vertebral artery is occluded from its origin to the vertebrobasilar confluence, unchanged. CTA HEAD FINDINGS POSTERIOR CIRCULATION: --Basilar artery: Normal. --Posterior cerebral  arteries: Diminished flow in the right PCA, which is predominantly supplied by the right vertebral artery. Left PCA is normal. Small left P-comm. --Superior cerebellar arteries: Normal. --Inferior cerebellar arteries: Right PICA is opacified via collateral flow across the vertebrobasilar confluence. ANTERIOR CIRCULATION: --Intracranial internal carotid arteries: No flow in the right ICA. Mild calcification along the course of the left ICA. --Anterior cerebral arteries: Normal. Both A1 segments are present. Patent anterior communicating artery. --Middle cerebral arteries: Normal. --Posterior communicating arteries: Present bilaterally. VENOUS SINUSES: Minimal opacification due to contrast timing. ANATOMIC VARIANTS: None DELAYED PHASE: Not performed. Review of the MIP images confirms the above findings. CT Brain Perfusion Findings: CBF (<30%) Volume: 59mL Perfusion (Tmax>6.0s) volume: 389mL Mismatch Volume: 31mL Commentary: Entire right hemisphere is labeled as ischemic penumbra by computerized data analysis. However, this may be due to reduced or delayed perfusion of the right hemisphere, which is supplied entirely by collateral flow, as the vertebral and internal carotid arteries on the right are both occluded. IMPRESSION: 1. 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 The right vertebral occlusion is unchanged 07/18/2016. The right ICA occlusion has progressed from severe stenosis. 2. Poor opacification of the distal right PCA, likely occluded or severely stenotic. No other intracranial occlusion or high-grade stenosis. 3. Perfusion data analysis shows diffuse right hemispheric ischemia without a core infarct. In the context of occluded right internal carotid and vertebral arteries, the elevated Tmax values might be secondary to the right hemisphere being supplied by contralateral collateral flow. Findings might also indicate a hypoperfusion state. These results were  called by telephone at the time of interpretation on 05/23/2018 at 7:31 pm to Dr. Amie Portland , who verbally acknowledged these results. Electronically Signed   By: Cletus Gash.D.  On: 05/23/2018 19:33   Ct Angio Neck W Or Wo Contrast  Result Date: 05/23/2018 CLINICAL DATA:  Altered mental status.  Focal neurologic deficit. EXAM: CT ANGIOGRAPHY HEAD AND NECK CT PERFUSION BRAIN TECHNIQUE: Multidetector CT imaging of the head and neck was performed using the standard protocol during bolus administration of intravenous contrast. Multiplanar CT image reconstructions and MIPs were obtained to evaluate the vascular anatomy. Carotid stenosis measurements (when applicable) are obtained utilizing NASCET criteria, using the distal internal carotid diameter as the denominator. Multiphase CT imaging of the brain was performed following IV bolus contrast injection. Subsequent parametric perfusion maps were calculated using RAPID software. CONTRAST:  170mL ISOVUE-370 IOPAMIDOL (ISOVUE-370) INJECTION 76% COMPARISON:  Head CT 05/23/2018 FINDINGS: CTA NECK FINDINGS SKELETON: There is no bony spinal canal stenosis. No lytic or blastic lesion. OTHER NECK: Normal pharynx, larynx and major salivary glands. No cervical lymphadenopathy. Unremarkable thyroid gland. UPPER CHEST: No pneumothorax or pleural effusion. No nodules or masses. AORTIC ARCH: There is no calcific atherosclerosis of the aortic arch. There is no aneurysm, dissection or hemodynamically significant stenosis of the visualized ascending aorta and aortic arch. Conventional 3 vessel aortic branching pattern. The visualized proximal subclavian arteries are widely patent. RIGHT CAROTID SYSTEM: --Common carotid artery: Widely patent origin without common carotid artery dissection or aneurysm. 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 No reconstitution to the skull base. Compared to the CTA from 07/18/2016 the occlusion has worsened from a  previously severe stenosis. --External carotid artery: No acute abnormality. LEFT CAROTID SYSTEM: --Common carotid artery: Widely patent origin without common carotid artery dissection or aneurysm. --Internal carotid artery: Normal without aneurysm, dissection or stenosis. --External carotid artery: No acute abnormality. VERTEBRAL ARTERIES: Left dominant configuration. Left vertebral artery origin is normal. The right vertebral artery is occluded from its origin to the vertebrobasilar confluence, unchanged. CTA HEAD FINDINGS POSTERIOR CIRCULATION: --Basilar artery: Normal. --Posterior cerebral arteries: Diminished flow in the right PCA, which is predominantly supplied by the right vertebral artery. Left PCA is normal. Small left P-comm. --Superior cerebellar arteries: Normal. --Inferior cerebellar arteries: Right PICA is opacified via collateral flow across the vertebrobasilar confluence. ANTERIOR CIRCULATION: --Intracranial internal carotid arteries: No flow in the right ICA. Mild calcification along the course of the left ICA. --Anterior cerebral arteries: Normal. Both A1 segments are present. Patent anterior communicating artery. --Middle cerebral arteries: Normal. --Posterior communicating arteries: Present bilaterally. VENOUS SINUSES: Minimal opacification due to contrast timing. ANATOMIC VARIANTS: None DELAYED PHASE: Not performed. Review of the MIP images confirms the above findings. CT Brain Perfusion Findings: CBF (<30%) Volume: 38mL Perfusion (Tmax>6.0s) volume: 366mL Mismatch Volume: 33mL Commentary: Entire right hemisphere is labeled as ischemic penumbra by computerized data analysis. However, this may be due to reduced or delayed perfusion of the right hemisphere, which is supplied entirely by collateral flow, as the vertebral and internal carotid arteries on the right are both occluded. IMPRESSION: 1. 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 The right  vertebral occlusion is unchanged 07/18/2016. The right ICA occlusion has progressed from severe stenosis. 2. Poor opacification of the distal right PCA, likely occluded or severely stenotic. No other intracranial occlusion or high-grade stenosis. 3. Perfusion data analysis shows diffuse right hemispheric ischemia without a core infarct. In the context of occluded right internal carotid and vertebral arteries, the elevated Tmax values might be secondary to the right hemisphere being supplied by contralateral collateral flow. Findings might also indicate a hypoperfusion state. These results  were called by telephone at the time of interpretation on 05/23/2018 at 7:31 pm to Dr. Amie Portland , who verbally acknowledged these results. Electronically Signed   By: Ulyses Jarred M.D.   On: 05/23/2018 19:33   Mr Brain Wo Contrast  Result Date: 05/24/2018 CLINICAL DATA:  Follow up stroke, RIGHT-sided weakness now resolved. History of hypertension, hyperlipidemia, dementia and stroke. EXAM: MRI HEAD WITHOUT CONTRAST TECHNIQUE: Multiplanar, multiecho pulse sequences of the brain and surrounding structures were obtained without intravenous contrast. COMPARISON:  CT HEAD May 23, 2018 and MRI of the head July 20, 2016 FINDINGS: INTRACRANIAL CONTENTS: No reduced diffusion to suggest acute ischemia. RIGHT frontoparietal to lesser extent RIGHT occipital encephalomalacia with minimal susceptibility artifact. Ex vacuo dilatation RIGHT lateral ventricle. Old small RIGHT cerebellar infarcts. Moderate parenchymal brain volume loss. No hydrocephalus. Mild RIGHT cerebral peduncle volume loss consistent with wallerian degeneration. No midline shift, mass effect or masses. No abnormal extra-axial fluid collections. VASCULAR: Loss of normal RIGHT internal carotid artery flow void. SKULL AND UPPER CERVICAL SPINE: No abnormal sellar expansion. No suspicious calvarial bone marrow signal. Craniocervical junction maintained. SINUSES/ORBITS:  Mild paranasal sinus mucosal thickening without air-fluid levels. Mastoid air cells are well aerated.The included ocular globes and orbital contents are non-suspicious. OTHER: None. IMPRESSION: 1. No acute intracranial process. 2. Multifocal old RIGHT MCA and posterior border zone territory infarcts. 3. RIGHT ICA occlusion. 4. Moderate parenchymal brain volume loss. Electronically Signed   By: Elon Alas M.D.   On: 05/24/2018 04:13   Ct Cerebral Perfusion W Contrast  Result Date: 05/23/2018 CLINICAL DATA:  Altered mental status.  Focal neurologic deficit. EXAM: CT ANGIOGRAPHY HEAD AND NECK CT PERFUSION BRAIN TECHNIQUE: Multidetector CT imaging of the head and neck was performed using the standard protocol during bolus administration of intravenous contrast. Multiplanar CT image reconstructions and MIPs were obtained to evaluate the vascular anatomy. Carotid stenosis measurements (when applicable) are obtained utilizing NASCET criteria, using the distal internal carotid diameter as the denominator. Multiphase CT imaging of the brain was performed following IV bolus contrast injection. Subsequent parametric perfusion maps were calculated using RAPID software. CONTRAST:  139mL ISOVUE-370 IOPAMIDOL (ISOVUE-370) INJECTION 76% COMPARISON:  Head CT 05/23/2018 FINDINGS: CTA NECK FINDINGS SKELETON: There is no bony spinal canal stenosis. No lytic or blastic lesion. OTHER NECK: Normal pharynx, larynx and major salivary glands. No cervical lymphadenopathy. Unremarkable thyroid gland. UPPER CHEST: No pneumothorax or pleural effusion. No nodules or masses. AORTIC ARCH: There is no calcific atherosclerosis of the aortic arch. There is no aneurysm, dissection or hemodynamically significant stenosis of the visualized ascending aorta and aortic arch. Conventional 3 vessel aortic branching pattern. The visualized proximal subclavian arteries are widely patent. RIGHT CAROTID SYSTEM: --Common carotid artery: Widely patent  origin without common carotid artery dissection or aneurysm. 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 No reconstitution to the skull base. Compared to the CTA from 07/18/2016 the occlusion has worsened from a previously severe stenosis. --External carotid artery: No acute abnormality. LEFT CAROTID SYSTEM: --Common carotid artery: Widely patent origin without common carotid artery dissection or aneurysm. --Internal carotid artery: Normal without aneurysm, dissection or stenosis. --External carotid artery: No acute abnormality. VERTEBRAL ARTERIES: Left dominant configuration. Left vertebral artery origin is normal. The right vertebral artery is occluded from its origin to the vertebrobasilar confluence, unchanged. CTA HEAD FINDINGS POSTERIOR CIRCULATION: --Basilar artery: Normal. --Posterior cerebral arteries: Diminished flow in the right PCA, which is predominantly supplied by the right vertebral artery. Left PCA is  normal. Small left P-comm. --Superior cerebellar arteries: Normal. --Inferior cerebellar arteries: Right PICA is opacified via collateral flow across the vertebrobasilar confluence. ANTERIOR CIRCULATION: --Intracranial internal carotid arteries: No flow in the right ICA. Mild calcification along the course of the left ICA. --Anterior cerebral arteries: Normal. Both A1 segments are present. Patent anterior communicating artery. --Middle cerebral arteries: Normal. --Posterior communicating arteries: Present bilaterally. VENOUS SINUSES: Minimal opacification due to contrast timing. ANATOMIC VARIANTS: None DELAYED PHASE: Not performed. Review of the MIP images confirms the above findings. CT Brain Perfusion Findings: CBF (<30%) Volume: 45mL Perfusion (Tmax>6.0s) volume: 348mL Mismatch Volume: 350mL Commentary: Entire right hemisphere is labeled as ischemic penumbra by computerized data analysis. However, this may be due to reduced or delayed perfusion of the right hemisphere, which is  supplied entirely by collateral flow, as the vertebral and internal carotid arteries on the right are both occluded. IMPRESSION: 1. 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 The right vertebral occlusion is unchanged 07/18/2016. The right ICA occlusion has progressed from severe stenosis. 2. Poor opacification of the distal right PCA, likely occluded or severely stenotic. No other intracranial occlusion or high-grade stenosis. 3. Perfusion data analysis shows diffuse right hemispheric ischemia without a core infarct. In the context of occluded right internal carotid and vertebral arteries, the elevated Tmax values might be secondary to the right hemisphere being supplied by contralateral collateral flow. Findings might also indicate a hypoperfusion state. These results were called by telephone at the time of interpretation on 05/23/2018 at 7:31 pm to Dr. Amie Portland , who verbally acknowledged these results. Electronically Signed   By: Ulyses Jarred M.D.   On: 05/23/2018 19:33   Dg Chest Portable 1 View  Result Date: 05/23/2018 CLINICAL DATA:  Acute onset of RIGHT facial droop and rightward gaze while eating dinner earlier this evening. EXAM: PORTABLE CHEST 1 VIEW COMPARISON:  07/16/2016 and earlier. FINDINGS: Cardiac silhouette upper normal in size to slightly enlarged for AP portable technique, unchanged. Lungs clear. Bronchovascular markings normal. Pulmonary vascularity normal. No visible pleural effusions. No pneumothorax. IMPRESSION: Stable borderline heart size. No acute cardiopulmonary disease. Electronically Signed   By: Evangeline Dakin M.D.   On: 05/23/2018 20:13   Ct Head Code Stroke Wo Contrast  Result Date: 05/23/2018 CLINICAL DATA:  Code stroke. Altered mental status. Last seen normal 17:55 EXAM: CT HEAD WITHOUT CONTRAST TECHNIQUE: Contiguous axial images were obtained from the base of the skull through the vertex without intravenous contrast.  COMPARISON:  CTA head neck 07/18/2016 FINDINGS: Brain: There is no mass, hemorrhage or extra-axial collection. The size and configuration of the ventricles and extra-axial CSF spaces are normal. There is hypoattenuation in the right parietal lobe with ex vacuo dilatation of the posterior right lateral ventricle, likely an old infarct. Vascular: No abnormal hyperdensity of the major intracranial arteries or dural venous sinuses. No intracranial atherosclerosis. Skull: The visualized skull base, calvarium and extracranial soft tissues are normal. Sinuses/Orbits: No fluid levels or advanced mucosal thickening of the visualized paranasal sinuses. No mastoid or middle ear effusion. The orbits are normal. ASPECTS [facility] Stroke Program Early CT Score) - Ganglionic level infarction (caudate, lentiform nuclei, internal capsule, insula, M1-M3 cortex): 7 - Supraganglionic infarction (M4-M6 cortex): 3 Total score (0-10 with 10 being normal): 10 IMPRESSION: 1. No acute hemorrhage. 2. Hypoattenuation in the right parietal lobe, suspected to be an old infarct. 3. ASPECTS is 10. * These results were communicated to Dr. Amie Portland at 6:59 pm  on 05/23/2018 by text page via the [facility] messaging system. Electronically Signed   By: Ulyses Jarred M.D.   On: 05/23/2018 19:00    Procedures .Critical Care Performed by: Varney Biles, MD Authorized by: Varney Biles, MD   Critical care provider statement:    Critical care time (minutes):  33   Critical care was necessary to treat or prevent imminent or life-threatening deterioration of the following conditions:  CNS failure or compromise   Critical care was time spent personally by me on the following activities:  Discussions with consultants, evaluation of patient's response to treatment, examination of patient, ordering and performing treatments and interventions, ordering and review of laboratory studies, ordering and review of radiographic studies, pulse oximetry,  re-evaluation of patient's condition, obtaining history from patient or surrogate and review of old charts   (including critical care time)  Medications Ordered in ED Medications   stroke: mapping our early stages of recovery book (has no administration in time range)  acetaminophen (TYLENOL) tablet 650 mg (has no administration in time range)  senna-docusate (Senokot-S) tablet 1 tablet (has no administration in time range)  norepinephrine (LEVOPHED) 4-5 MG/250ML-% infusion SOLN (  Not Given 05/23/18 1955)  clopidogrel (PLAVIX) tablet 75 mg (75 mg Oral Given 05/25/18 1100)  0.9 %  sodium chloride infusion (0 mLs Intravenous Hold 05/23/18 2045)  atorvastatin (LIPITOR) tablet 80 mg (80 mg Oral Given 05/25/18 1755)  finasteride (PROSCAR) tablet 5 mg (5 mg Oral Given 05/25/18 1100)  insulin aspart (novoLOG) injection 0-9 Units (0 Units Subcutaneous Not Given 05/25/18 1759)  calcium-vitamin D (OSCAL WITH D) 500-200 MG-UNIT per tablet 1 tablet (1 tablet Oral Given 05/25/18 0838)  lisinopril (PRINIVIL,ZESTRIL) tablet 10 mg (10 mg Oral Given 05/25/18 1137)  labetalol (NORMODYNE,TRANDATE) injection 10 mg (10 mg Intravenous Given 05/25/18 1450)  sodium chloride flush (NS) 0.9 % injection 3 mL (3 mLs Intravenous Given 05/23/18 1947)  iopamidol (ISOVUE-370) 76 % injection 100 mL (100 mLs Intravenous Contrast Given 05/23/18 1858)  sodium chloride 0.9 % bolus 2,000 mL (0 mLs Intravenous Stopping Infusion hung by another clincian 05/23/18 2054)  sodium chloride 0.9 % bolus 1,000 mL (0 mLs Intravenous Stopping Infusion hung by another clincian 05/23/18 2054)  dextrose 50 % solution (25 mLs  Given 05/24/18 0006)  labetalol (NORMODYNE,TRANDATE) 5 MG/ML injection (10 mg  Given 05/25/18 1243)     Initial Impression / Assessment and Plan / ED Course  I have reviewed the triage vital signs and the nursing notes.  Pertinent labs & imaging results that were available during my care of the patient were reviewed by me and  considered in my medical decision making (see chart for details).        73 year old male comes to the ER with chief complaint of altered mental status.  Patient has history of stroke with left-sided residual weakness. Patient is within the window of TPA and arrived as a code stroke.  Initially the impression was that maybe he had a seizure like activity, however neurology was able to get further history and there is suspicion increased for possible reactivation of previous stroke or a watershed stroke.  Patient is not a TPA candidate.  His blood pressure dropped after he came to the ER.  We have ordered 2 L of IV fluid and his BP has improved slightly.  Neurology team will order pressors to ensure that patient does not have worsening of his ischemia because of hypotension-hypoperfusion.  They will consult critical care as well for  further assistant but be primary admitting team.  Family has been made aware of the plan and they are in agreement.  Final Clinical Impressions(s) / ED Diagnoses   Final diagnoses:  Acute ischemic stroke [facility])    ED Discharge Orders    None       Varney Biles, MD 05/25/18 1844

## 2018-05-25 NOTE — Progress Notes (Signed)
OT Cancellation Note:    05/25/18 1242  OT Visit Information  Last OT Received On 05/25/18  Assistance Needed +1  Reason Eval/Treat Not Completed Other (comment) (Pt transferring units)   Hulda Humphrey OTR/L Acute Rehabilitation Services Pager: [phone] Office: [phone]

## 2018-05-25 NOTE — Progress Notes (Signed)
STROKE TEAM PROGRESS NOTE   SUBJECTIVE (INTERVAL HISTORY) No family is at the bedside.  No acute event overnight. Pt at his baseline. BP at 150s. Will start low dose lisinopril. BP goal 130-150. Will check orthostatic vitals.    OBJECTIVE Temp:  [97.8 F (36.6 C)-99.1 F (37.3 C)] 97.8 F (36.6 C) (02/27 0800) Pulse Rate:  [30-106] 65 (02/27 0700) Cardiac Rhythm: Normal sinus rhythm (02/27 0400) Resp:  [14-22] 16 (02/27 0700) BP: (95-195)/(48-114) 159/73 (02/27 0700) SpO2:  [59 %-100 %] 100 % (02/27 0700)  Recent Labs  Lab 05/24/18 0851 05/24/18 1206 05/24/18 1543 05/24/18 1955 05/25/18 0720  GLUCAP 74 136* 100* 106* 89   Recent Labs  Lab 05/23/18 1840 05/23/18 1848 05/24/18 0008 05/25/18 0546  NA 132*  --  138 139  K 5.5*  --  4.3 4.1  CL 103  --  107 106  CO2 17*  --  23 26  GLUCOSE 155*  --  148* 104*  BUN 33*  --  25* 12  CREATININE 1.51* 1.60* 1.04 0.79  CALCIUM 9.0  --  8.6* 8.9  MG  --   --  2.0  --   PHOS  --   --  3.0  --    Recent Labs  Lab 05/23/18 1840 05/24/18 0008  AST 42*  --   ALT 26  --   ALKPHOS 86  --   BILITOT 1.7*  --   PROT 6.0*  --   ALBUMIN 3.4* 3.1*   Recent Labs  Lab 05/23/18 1840 05/24/18 0008 05/25/18 0546  WBC 6.7 6.7 5.8  NEUTROABS 4.4  --   --   HGB 11.0* 10.8* 11.3*  HCT 36.8* 36.2* 36.9*  MCV 92.0 91.2 91.3  PLT 289 255 239   Recent Labs  Lab 05/24/18 0008  TROPONINI <0.03   Recent Labs    05/23/18 1840  LABPROT 14.6  INR 1.2   Recent Labs    05/23/18 2118 05/24/18 1600  COLORURINE STRAW* COLORLESS*  LABSPEC 1.020 1.003*  PHURINE 6.0 6.0  GLUCOSEU NEGATIVE NEGATIVE  HGBUR NEGATIVE NEGATIVE  BILIRUBINUR NEGATIVE NEGATIVE  KETONESUR NEGATIVE NEGATIVE  PROTEINUR NEGATIVE NEGATIVE  NITRITE NEGATIVE NEGATIVE  LEUKOCYTESUR NEGATIVE NEGATIVE       Component Value Date/Time   CHOL 122 05/24/2018 0008   TRIG 72 05/24/2018 0008   HDL 34 (L) 05/24/2018 0008   CHOLHDL 3.6 05/24/2018 0008   VLDL 14  05/24/2018 0008   LDLCALC 74 05/24/2018 0008   Lab Results  Component Value Date   HGBA1C 5.1 05/24/2018      Component Value Date/Time   LABOPIA NONE DETECTED 05/24/2018 1600   COCAINSCRNUR NONE DETECTED 05/24/2018 1600   LABBENZ NONE DETECTED 05/24/2018 1600   AMPHETMU NONE DETECTED 05/24/2018 1600   THCU NONE DETECTED 05/24/2018 1600   LABBARB NONE DETECTED 05/24/2018 1600    No results for input(s): ETH in the last 168 hours.  I have personally reviewed the radiological images below and agree with the radiology interpretations.  Ct Angio Head W Or Wo Contrast  Result Date: 05/23/2018 CLINICAL DATA:  Altered mental status.  Focal neurologic deficit. EXAM: CT ANGIOGRAPHY HEAD AND NECK CT PERFUSION BRAIN TECHNIQUE: Multidetector CT imaging of the head and neck was performed using the standard protocol during bolus administration of intravenous contrast. Multiplanar CT image reconstructions and MIPs were obtained to evaluate the vascular anatomy. Carotid stenosis measurements (when applicable) are obtained utilizing NASCET criteria, using the distal internal carotid diameter  as the denominator. Multiphase CT imaging of the brain was performed following IV bolus contrast injection. Subsequent parametric perfusion maps were calculated using RAPID software. CONTRAST:  138mL ISOVUE-370 IOPAMIDOL (ISOVUE-370) INJECTION 76% COMPARISON:  Head CT 05/23/2018 FINDINGS: CTA NECK FINDINGS SKELETON: There is no bony spinal canal stenosis. No lytic or blastic lesion. OTHER NECK: Normal pharynx, larynx and major salivary glands. No cervical lymphadenopathy. Unremarkable thyroid gland. UPPER CHEST: No pneumothorax or pleural effusion. No nodules or masses. AORTIC ARCH: There is no calcific atherosclerosis of the aortic arch. There is no aneurysm, dissection or hemodynamically significant stenosis of the visualized ascending aorta and aortic arch. Conventional 3 vessel aortic branching pattern. The visualized  proximal subclavian arteries are widely patent. RIGHT CAROTID SYSTEM: --Common carotid artery: Widely patent origin without common carotid artery dissection or aneurysm. 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 No reconstitution to the skull base. Compared to the CTA from 07/18/2016 the occlusion has worsened from a previously severe stenosis. --External carotid artery: No acute abnormality. LEFT CAROTID SYSTEM: --Common carotid artery: Widely patent origin without common carotid artery dissection or aneurysm. --Internal carotid artery: Normal without aneurysm, dissection or stenosis. --External carotid artery: No acute abnormality. VERTEBRAL ARTERIES: Left dominant configuration. Left vertebral artery origin is normal. The right vertebral artery is occluded from its origin to the vertebrobasilar confluence, unchanged. CTA HEAD FINDINGS POSTERIOR CIRCULATION: --Basilar artery: Normal. --Posterior cerebral arteries: Diminished flow in the right PCA, which is predominantly supplied by the right vertebral artery. Left PCA is normal. Small left P-comm. --Superior cerebellar arteries: Normal. --Inferior cerebellar arteries: Right PICA is opacified via collateral flow across the vertebrobasilar confluence. ANTERIOR CIRCULATION: --Intracranial internal carotid arteries: No flow in the right ICA. Mild calcification along the course of the left ICA. --Anterior cerebral arteries: Normal. Both A1 segments are present. Patent anterior communicating artery. --Middle cerebral arteries: Normal. --Posterior communicating arteries: Present bilaterally. VENOUS SINUSES: Minimal opacification due to contrast timing. ANATOMIC VARIANTS: None DELAYED PHASE: Not performed. Review of the MIP images confirms the above findings. CT Brain Perfusion Findings: CBF (<30%) Volume: 37mL Perfusion (Tmax>6.0s) volume: 334mL Mismatch Volume: 35mL Commentary: Entire right hemisphere is labeled as ischemic penumbra by computerized  data analysis. However, this may be due to reduced or delayed perfusion of the right hemisphere, which is supplied entirely by collateral flow, as the vertebral and internal carotid arteries on the right are both occluded. IMPRESSION: 1. 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 The right vertebral occlusion is unchanged 07/18/2016. The right ICA occlusion has progressed from severe stenosis. 2. Poor opacification of the distal right PCA, likely occluded or severely stenotic. No other intracranial occlusion or high-grade stenosis. 3. Perfusion data analysis shows diffuse right hemispheric ischemia without a core infarct. In the context of occluded right internal carotid and vertebral arteries, the elevated Tmax values might be secondary to the right hemisphere being supplied by contralateral collateral flow. Findings might also indicate a hypoperfusion state. These results were called by telephone at the time of interpretation on 05/23/2018 at 7:31 pm to Dr. Amie Portland , who verbally acknowledged these results. Electronically Signed   By: Ulyses Jarred M.D.   On: 05/23/2018 19:33   Ct Angio Neck W Or Wo Contrast  Result Date: 05/23/2018 CLINICAL DATA:  Altered mental status.  Focal neurologic deficit. EXAM: CT ANGIOGRAPHY HEAD AND NECK CT PERFUSION BRAIN TECHNIQUE: Multidetector CT imaging of the head and neck was performed using the standard protocol  during bolus administration of intravenous contrast. Multiplanar CT image reconstructions and MIPs were obtained to evaluate the vascular anatomy. Carotid stenosis measurements (when applicable) are obtained utilizing NASCET criteria, using the distal internal carotid diameter as the denominator. Multiphase CT imaging of the brain was performed following IV bolus contrast injection. Subsequent parametric perfusion maps were calculated using RAPID software. CONTRAST:  128mL ISOVUE-370 IOPAMIDOL (ISOVUE-370) INJECTION  76% COMPARISON:  Head CT 05/23/2018 FINDINGS: CTA NECK FINDINGS SKELETON: There is no bony spinal canal stenosis. No lytic or blastic lesion. OTHER NECK: Normal pharynx, larynx and major salivary glands. No cervical lymphadenopathy. Unremarkable thyroid gland. UPPER CHEST: No pneumothorax or pleural effusion. No nodules or masses. AORTIC ARCH: There is no calcific atherosclerosis of the aortic arch. There is no aneurysm, dissection or hemodynamically significant stenosis of the visualized ascending aorta and aortic arch. Conventional 3 vessel aortic branching pattern. The visualized proximal subclavian arteries are widely patent. RIGHT CAROTID SYSTEM: --Common carotid artery: Widely patent origin without common carotid artery dissection or aneurysm. 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 No reconstitution to the skull base. Compared to the CTA from 07/18/2016 the occlusion has worsened from a previously severe stenosis. --External carotid artery: No acute abnormality. LEFT CAROTID SYSTEM: --Common carotid artery: Widely patent origin without common carotid artery dissection or aneurysm. --Internal carotid artery: Normal without aneurysm, dissection or stenosis. --External carotid artery: No acute abnormality. VERTEBRAL ARTERIES: Left dominant configuration. Left vertebral artery origin is normal. The right vertebral artery is occluded from its origin to the vertebrobasilar confluence, unchanged. CTA HEAD FINDINGS POSTERIOR CIRCULATION: --Basilar artery: Normal. --Posterior cerebral arteries: Diminished flow in the right PCA, which is predominantly supplied by the right vertebral artery. Left PCA is normal. Small left P-comm. --Superior cerebellar arteries: Normal. --Inferior cerebellar arteries: Right PICA is opacified via collateral flow across the vertebrobasilar confluence. ANTERIOR CIRCULATION: --Intracranial internal carotid arteries: No flow in the right ICA. Mild calcification along  the course of the left ICA. --Anterior cerebral arteries: Normal. Both A1 segments are present. Patent anterior communicating artery. --Middle cerebral arteries: Normal. --Posterior communicating arteries: Present bilaterally. VENOUS SINUSES: Minimal opacification due to contrast timing. ANATOMIC VARIANTS: None DELAYED PHASE: Not performed. Review of the MIP images confirms the above findings. CT Brain Perfusion Findings: CBF (<30%) Volume: 67mL Perfusion (Tmax>6.0s) volume: 351mL Mismatch Volume: 33mL Commentary: Entire right hemisphere is labeled as ischemic penumbra by computerized data analysis. However, this may be due to reduced or delayed perfusion of the right hemisphere, which is supplied entirely by collateral flow, as the vertebral and internal carotid arteries on the right are both occluded. IMPRESSION: 1. 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 The right vertebral occlusion is unchanged 07/18/2016. The right ICA occlusion has progressed from severe stenosis. 2. Poor opacification of the distal right PCA, likely occluded or severely stenotic. No other intracranial occlusion or high-grade stenosis. 3. Perfusion data analysis shows diffuse right hemispheric ischemia without a core infarct. In the context of occluded right internal carotid and vertebral arteries, the elevated Tmax values might be secondary to the right hemisphere being supplied by contralateral collateral flow. Findings might also indicate a hypoperfusion state. These results were called by telephone at the time of interpretation on 05/23/2018 at 7:31 pm to Dr. Amie Portland , who verbally acknowledged these results. Electronically Signed   By: Ulyses Jarred M.D.   On: 05/23/2018 19:33   Mr Brain Wo Contrast  Result Date: 05/24/2018 CLINICAL DATA:  Follow up stroke, RIGHT-sided weakness now resolved. History of hypertension, hyperlipidemia, dementia and stroke. EXAM: MRI HEAD WITHOUT  CONTRAST TECHNIQUE: Multiplanar, multiecho pulse sequences of the brain and surrounding structures were obtained without intravenous contrast. COMPARISON:  CT HEAD May 23, 2018 and MRI of the head July 20, 2016 FINDINGS: INTRACRANIAL CONTENTS: No reduced diffusion to suggest acute ischemia. RIGHT frontoparietal to lesser extent RIGHT occipital encephalomalacia with minimal susceptibility artifact. Ex vacuo dilatation RIGHT lateral ventricle. Old small RIGHT cerebellar infarcts. Moderate parenchymal brain volume loss. No hydrocephalus. Mild RIGHT cerebral peduncle volume loss consistent with wallerian degeneration. No midline shift, mass effect or masses. No abnormal extra-axial fluid collections. VASCULAR: Loss of normal RIGHT internal carotid artery flow void. SKULL AND UPPER CERVICAL SPINE: No abnormal sellar expansion. No suspicious calvarial bone marrow signal. Craniocervical junction maintained. SINUSES/ORBITS: Mild paranasal sinus mucosal thickening without air-fluid levels. Mastoid air cells are well aerated.The included ocular globes and orbital contents are non-suspicious. OTHER: None. IMPRESSION: 1. No acute intracranial process. 2. Multifocal old RIGHT MCA and posterior border zone territory infarcts. 3. RIGHT ICA occlusion. 4. Moderate parenchymal brain volume loss. Electronically Signed   By: Elon Alas M.D.   On: 05/24/2018 04:13   Ct Cerebral Perfusion W Contrast  Result Date: 05/23/2018 CLINICAL DATA:  Altered mental status.  Focal neurologic deficit. EXAM: CT ANGIOGRAPHY HEAD AND NECK CT PERFUSION BRAIN TECHNIQUE: Multidetector CT imaging of the head and neck was performed using the standard protocol during bolus administration of intravenous contrast. Multiplanar CT image reconstructions and MIPs were obtained to evaluate the vascular anatomy. Carotid stenosis measurements (when applicable) are obtained utilizing NASCET criteria, using the distal internal carotid diameter as the  denominator. Multiphase CT imaging of the brain was performed following IV bolus contrast injection. Subsequent parametric perfusion maps were calculated using RAPID software. CONTRAST:  174mL ISOVUE-370 IOPAMIDOL (ISOVUE-370) INJECTION 76% COMPARISON:  Head CT 05/23/2018 FINDINGS: CTA NECK FINDINGS SKELETON: There is no bony spinal canal stenosis. No lytic or blastic lesion. OTHER NECK: Normal pharynx, larynx and major salivary glands. No cervical lymphadenopathy. Unremarkable thyroid gland. UPPER CHEST: No pneumothorax or pleural effusion. No nodules or masses. AORTIC ARCH: There is no calcific atherosclerosis of the aortic arch. There is no aneurysm, dissection or hemodynamically significant stenosis of the visualized ascending aorta and aortic arch. Conventional 3 vessel aortic branching pattern. The visualized proximal subclavian arteries are widely patent. RIGHT CAROTID SYSTEM: --Common carotid artery: Widely patent origin without common carotid artery dissection or aneurysm. 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 No reconstitution to the skull base. Compared to the CTA from 07/18/2016 the occlusion has worsened from a previously severe stenosis. --External carotid artery: No acute abnormality. LEFT CAROTID SYSTEM: --Common carotid artery: Widely patent origin without common carotid artery dissection or aneurysm. --Internal carotid artery: Normal without aneurysm, dissection or stenosis. --External carotid artery: No acute abnormality. VERTEBRAL ARTERIES: Left dominant configuration. Left vertebral artery origin is normal. The right vertebral artery is occluded from its origin to the vertebrobasilar confluence, unchanged. CTA HEAD FINDINGS POSTERIOR CIRCULATION: --Basilar artery: Normal. --Posterior cerebral arteries: Diminished flow in the right PCA, which is predominantly supplied by the right vertebral artery. Left PCA is normal. Small left P-comm. --Superior cerebellar arteries:  Normal. --Inferior cerebellar arteries: Right PICA is opacified via collateral flow across the vertebrobasilar confluence. ANTERIOR CIRCULATION: --Intracranial internal carotid arteries: No flow in the right ICA. Mild calcification along the course of the left ICA. --Anterior cerebral arteries: Normal. Both A1 segments  are present. Patent anterior communicating artery. --Middle cerebral arteries: Normal. --Posterior communicating arteries: Present bilaterally. VENOUS SINUSES: Minimal opacification due to contrast timing. ANATOMIC VARIANTS: None DELAYED PHASE: Not performed. Review of the MIP images confirms the above findings. CT Brain Perfusion Findings: CBF (<30%) Volume: 12mL Perfusion (Tmax>6.0s) volume: 341mL Mismatch Volume: 38mL Commentary: Entire right hemisphere is labeled as ischemic penumbra by computerized data analysis. However, this may be due to reduced or delayed perfusion of the right hemisphere, which is supplied entirely by collateral flow, as the vertebral and internal carotid arteries on the right are both occluded. IMPRESSION: 1. 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 The right vertebral occlusion is unchanged 07/18/2016. The right ICA occlusion has progressed from severe stenosis. 2. Poor opacification of the distal right PCA, likely occluded or severely stenotic. No other intracranial occlusion or high-grade stenosis. 3. Perfusion data analysis shows diffuse right hemispheric ischemia without a core infarct. In the context of occluded right internal carotid and vertebral arteries, the elevated Tmax values might be secondary to the right hemisphere being supplied by contralateral collateral flow. Findings might also indicate a hypoperfusion state. These results were called by telephone at the time of interpretation on 05/23/2018 at 7:31 pm to Dr. Amie Portland , who verbally acknowledged these results. Electronically Signed   By: Ulyses Jarred  M.D.   On: 05/23/2018 19:33   Dg Chest Portable 1 View  Result Date: 05/23/2018 CLINICAL DATA:  Acute onset of RIGHT facial droop and rightward gaze while eating dinner earlier this evening. EXAM: PORTABLE CHEST 1 VIEW COMPARISON:  07/16/2016 and earlier. FINDINGS: Cardiac silhouette upper normal in size to slightly enlarged for AP portable technique, unchanged. Lungs clear. Bronchovascular markings normal. Pulmonary vascularity normal. No visible pleural effusions. No pneumothorax. IMPRESSION: Stable borderline heart size. No acute cardiopulmonary disease. Electronically Signed   By: Evangeline Dakin M.D.   On: 05/23/2018 20:13   Ct Head Code Stroke Wo Contrast  Result Date: 05/23/2018 CLINICAL DATA:  Code stroke. Altered mental status. Last seen normal 17:55 EXAM: CT HEAD WITHOUT CONTRAST TECHNIQUE: Contiguous axial images were obtained from the base of the skull through the vertex without intravenous contrast. COMPARISON:  CTA head neck 07/18/2016 FINDINGS: Brain: There is no mass, hemorrhage or extra-axial collection. The size and configuration of the ventricles and extra-axial CSF spaces are normal. There is hypoattenuation in the right parietal lobe with ex vacuo dilatation of the posterior right lateral ventricle, likely an old infarct. Vascular: No abnormal hyperdensity of the major intracranial arteries or dural venous sinuses. No intracranial atherosclerosis. Skull: The visualized skull base, calvarium and extracranial soft tissues are normal. Sinuses/Orbits: No fluid levels or advanced mucosal thickening of the visualized paranasal sinuses. No mastoid or middle ear effusion. The orbits are normal. ASPECTS [facility] Stroke Program Early CT Score) - Ganglionic level infarction (caudate, lentiform nuclei, internal capsule, insula, M1-M3 cortex): 7 - Supraganglionic infarction (M4-M6 cortex): 3 Total score (0-10 with 10 being normal): 10 IMPRESSION: 1. No acute hemorrhage. 2. Hypoattenuation in the  right parietal lobe, suspected to be an old infarct. 3. ASPECTS is 10. * These results were communicated to Dr. Amie Portland at 6:59 pm on 05/23/2018 by text page via the [facility] messaging system. Electronically Signed   By: Ulyses Jarred M.D.   On: 05/23/2018 19:00    PHYSICAL EXAM  Temp:  [97.8 F (36.6 C)-99.1 F (37.3 C)] 97.8 F (36.6 C) (02/27 0800) Pulse Rate:  [30-106] 65 (  02/27 0700) Resp:  [14-22] 16 (02/27 0700) BP: (95-195)/(48-114) 159/73 (02/27 0700) SpO2:  [59 %-100 %] 100 % (02/27 0700)  General - Well nourished, well developed, in no apparent distress.  Ophthalmologic - fundi not visualized due to noncooperation.  Cardiovascular - Regular rate and rhythm.  Mental Status -  Level of arousal and orientation to time, place, and person were intact. Language including expression, naming, repetition, comprehension was assessed and found intact. Fund of Knowledge was assessed and was intact.  Cranial Nerves II - XII - II - Visual field intact OU. III, IV, VI - Extraocular movements intact. V - Facial sensation intact bilaterally. VII - Facial movement intact bilaterally. VIII - Hearing & vestibular intact bilaterally. X - Palate elevates symmetrically, mild dysarthria, poor denture. XI - Chin turning & shoulder shrug intact bilaterally. XII - Tongue protrusion intact.  Motor Strength - The patient's strength was normal in all extremities and pronator drift was absent.  Bulk was normal and fasciculations were absent.   Motor Tone - Muscle tone was assessed at the neck and appendages and was normal.  Reflexes - The patient's reflexes were symmetrical in all extremities and he had no pathological reflexes.  Sensory - Light touch, temperature/pinprick were assessed and were symmetrical.    Coordination - The patient had normal movements in the hands and feet with no ataxia or dysmetria.  Tremor was absent.  Gait and Station - deferred.   ASSESSMENT/PLAN [name] is a 73 y.o. male with history of dementia, bipolar disorder, positive cancer under medical treatment, hypertension, hyperlipidemia, obesity, stroke 2 years ago admitted for unresponsive with left-sided weakness. No tPA given due to rapidly improving symptoms.    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  CT head and neck showed right ICA occluded at origin, progressed from 2 years ago which only showed occlusion at ICA siphon.  Unchanged right VA occlusion.  2D Echo pending  EEG normal  LDL 74  HgbA1c 5.1  SCDs for VTE prophylaxis  clopidogrel 75 mg daily prior to admission, now on clopidogrel 75 mg daily.   Patient counseled to be compliant with his antithrombotic medications  Ongoing aggressive stroke risk factor management  Therapy recommendations:  Pending  Disposition: Pending  Hypotension History of hypertension  BP low on admission, but now mildly high  Currently off neo   BP goal 130-150 given right ICA and right VA occlusion  PTA home BP meds including clonidine, lisinopril and metoprolol  Avoid low BP  Put on lisinopril 10mg  daily   Right ICA occlusion  In 06/2016 CTA showed right ICA occlusion at ophthalmic level with distal reconstitution  This admission CTA showed right ICA occlusion at bifurcation, progressed from 06/2016  BP goal 130-160  Avoid low BP  History of stroke  06/2016 admitted for a fall and difficulty walking.  Also had a cough and fever due to left lower lobe PNA.  MRI showed right MCA/ACA, MCA/PCA infarcts.  CTA head neck showed right ICA occlusion at the ophthalmic level with distal reconstitution, right P1 stenosis and right VA occlusion.  IR also confirmed right ICA occlusion, no intervention offered.  LDL 86 and A1c 5.4, patient discharged with DAPT for 3 months and Lipitor  80.  Hyperlipidemia  Home meds: Lipitor 80  LDL 74, goal < 70  Now on Lipitor  80  Continue statin at discharge  Other Stroke Risk Factors  Advanced age  Obesity, Body mass index is 32.11 kg/m.   Other Active Problems  Bipolar disorder  Prostate cancer  Hospital day # 2  This patient is critically ill due to TIA, right ICA occlusion, hypotension, history of stroke and at significant risk of neurological worsening, death form recurrent stroke, hypovolemic shock, heart failure, seizure. This patient's care requires constant monitoring of vital signs, hemodynamics, respiratory and cardiac monitoring, review of multiple databases, neurological assessment, discussion with family, other specialists and medical decision making of high complexity. I spent 35 minutes of neurocritical care time in the care of this patient.   [name], MD PhD Stroke Neurology 05/25/2018 11:03 AM    To contact Stroke Continuity provider, please refer to [email or web address] After hours, contact General Neurology

## 2018-05-25 NOTE — Progress Notes (Signed)
  Echocardiogram 2D Echocardiogram has been performed.  Jennette Dubin 05/25/2018, 11:06 AM

## 2018-05-25 NOTE — Progress Notes (Signed)
Physical Therapy Treatment Patient Details Name: Ronald [name] MRN: [id number] DOB: [birth year] Today's Date: 05/25/2018    History of Present Illness 73 yo male admitted after sudden onset of unresponsiveness on 2/25, CT and MRI showing no acute stroke. 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 Orthostatics assessed per nursing request. Supine BP 196/79, sitting BP 197/103, standing 200/102. Nursing in room and aware of BP. Pt back in bed and session ended due to vitals.  PT will follow as medically appropriate.   Follow Up Recommendations  No PT follow up     Equipment Recommendations  None recommended by PT    Recommendations for Other Services       Precautions / Restrictions Precautions Precautions: Fall Restrictions Weight Bearing Restrictions: No    Mobility  Bed Mobility Overal bed mobility: Needs Assistance Bed Mobility: Supine to Sit     Supine to sit: Supervision     General bed mobility comments: pt able to sit EOB using bed rail with supervision and management of lines/leads and increased time  Transfers Overall transfer level: Needs assistance Equipment used: Rolling walker (2 wheeled) Transfers: Sit to/from Stand Sit to Stand: Supervision            Ambulation/Gait                 Stairs             Wheelchair Mobility    Modified Rankin (Stroke Patients Only)       Balance Overall balance assessment: Mild deficits observed, not formally tested                                          Cognition Arousal/Alertness: Awake/alert Behavior During Therapy: WFL for tasks assessed/performed Overall Cognitive Status: History of cognitive impairments - at baseline                                        Exercises      General Comments         Pertinent Vitals/Pain Pain Assessment: No/denies pain    Home Living                      Prior Function            PT Goals (current goals can now be found in the care plan section) Progress towards PT goals: Progressing toward goals    Frequency    Min 4X/week      PT Plan Current plan remains appropriate    Co-evaluation              AM-PAC PT "6 Clicks" Mobility   Outcome Measure  Help needed turning from your back to your side while in a flat bed without using bedrails?: A Little Help needed moving from lying on your back to sitting on the side of a flat bed without using bedrails?: A Little Help needed moving to and from a bed to a chair (including a wheelchair)?: A Little Help needed standing up from a chair using your arms (e.g., wheelchair or bedside chair)?: A Little Help needed to walk in hospital room?: A  Little Help needed climbing 3-5 steps with a railing? : A Lot 6 Click Score: 17    End of Session Equipment Utilized During Treatment: Gait belt Activity Tolerance: Treatment limited secondary to medical complications (Comment)(BP) Patient left: in bed;with call bell/phone within reach;with family/visitor present;with nursing/sitter in room Nurse Communication: Mobility status(nursing aware of vital sign response) PT Visit Diagnosis: Other abnormalities of gait and mobility (R26.89);Other symptoms and signs involving the nervous system (R29.898)     Time: 1213-1228 PT Time Calculation (min) (ACUTE ONLY): 15 min  Charges:  $Therapeutic Activity: 8-22 mins                     [place], [place] [phone]    Krystale Rinkenberger 05/25/2018, 1:09 PM

## 2018-05-26 DIAGNOSIS — E669 Obesity, unspecified: Secondary | ICD-10-CM

## 2018-05-26 DIAGNOSIS — I959 Hypotension, unspecified: Secondary | ICD-10-CM

## 2018-05-26 DIAGNOSIS — I6521 Occlusion and stenosis of right carotid artery: Secondary | ICD-10-CM

## 2018-05-26 DIAGNOSIS — Z8673 Personal history of transient ischemic attack (TIA), and cerebral infarction without residual deficits: Secondary | ICD-10-CM

## 2018-05-26 LAB — GLUCOSE, CAPILLARY
Glucose-Capillary: 122 mg/dL — ABNORMAL HIGH (ref 70–99)
Glucose-Capillary: 97 mg/dL (ref 70–99)

## 2018-05-26 MED ORDER — LISINOPRIL 20 MG PO TABS: 20.0000 mg | ORAL_TABLET | Freq: Every day | ORAL | 2 refills | Status: DC

## 2018-05-26 NOTE — NC FL2 (Signed)
[place] MEDICAID FL2 LEVEL OF CARE SCREENING TOOL     IDENTIFICATION  Patient Name: Ronald [name] Birthdate: 16-Nov-1945 Sex: male Admission Date (Current Location): 05/23/2018  [facility] and [place] Number:  Herbalist and Address:  [facility], [place] [address] [place], [place] [address]      Provider Number: [id number]  Attending Physician Name and Address:  [name], MD  Relative Name and Phone Number:       Current Level of Care: Hospital Recommended Level of Care: [place] Prior Approval Number:    Date Approved/Denied:   PASRR Number:    Discharge Plan: Other (Comment)(ALF)    Current Diagnoses: Patient Active Problem List   Diagnosis Date Noted  . Hypotension 05/26/2018  . Right carotid artery occlusion 05/26/2018  . History of completed stroke 05/26/2018  . Obesity 05/26/2018  . TIA (transient ischemic attack) 05/23/2018  . Cerebrovascular accident (CVA) due to occlusion of right carotid artery ([place])   . Cerebral thrombosis with cerebral infarction 07/20/2016  . Non-traumatic rhabdomyolysis   . Bronchitis 07/15/2016  . Dementia ([place]) 07/15/2016  . CAP (community acquired pneumonia) 07/15/2016  . Acute left hemiparesis ([place]) 07/15/2016  . Abnormal urinalysis 07/15/2016  . Effusion of left knee 07/15/2016  . Elevated AST (SGOT) 07/15/2016  . Elevated troponin 07/15/2016  . Lobar pneumonia ([place])   . Prostate cancer ([place]) 10/21/2015  . Other seasonal allergic rhinitis 10/21/2015  . Essential hypertension 01/16/2014  . Bipolar 1 disorder ([place]) 08/02/2013  . GERD (gastroesophageal reflux disease) 07/04/2013  . Elevated PSA, less than 10 ng/ml 07/04/2013  . BPH with elevated PSA 02/28/2013  . Encounter for Medicare annual wellness exam 02/28/2013  . Hyperlipidemia 02/28/2013    Orientation RESPIRATION BLADDER Height & Weight     Self, Time, Situation, Place  Normal Continent Weight: 205 lb 0.4 oz  (93 kg) Height:  5\' 7"  (170.2 cm)  BEHAVIORAL SYMPTOMS/MOOD NEUROLOGICAL BOWEL NUTRITION STATUS      Continent Diet(no added salt)  AMBULATORY STATUS COMMUNICATION OF NEEDS Skin   Limited Assist Verbally Normal                       Personal Care Assistance Level of Assistance  Bathing, Feeding, Dressing Bathing Assistance: Limited assistance Feeding assistance: Independent Dressing Assistance: Limited assistance     Functional Limitations Info  Sight, Hearing, Speech Sight Info: Adequate Hearing Info: Adequate Speech Info: Adequate    SPECIAL CARE FACTORS FREQUENCY                       Contractures Contractures Info: Not present    Additional Factors Info  Code Status, Allergies, Insulin Sliding Scale Code Status Info: Full Allergies Info: Dust Mite Extract, Pollen Extract, Rye Grass Flower Pollen Extract Gramineae Pollens   Insulin Sliding Scale Info: 0-9 units 3x/day with meals and at bedtime       Current Medications (05/26/2018):  This is the current hospital active medication list Current Facility-Administered Medications  Medication Dose Route Frequency Provider Last Rate Last Dose  .  stroke: mapping our early stages of recovery book   Does not apply Once Amie Portland, MD      . 0.9 %  sodium chloride infusion  250 mL Intravenous Continuous Arnell Asal, NP   Stopped at 05/23/18 2045  . acetaminophen (TYLENOL) tablet 650 mg  650 mg Oral Q4H PRN Amie Portland, MD      .  atorvastatin (LIPITOR) tablet 80 mg  80 mg Oral q1800 [name], MD   80 mg at 05/25/18 1755  . calcium-vitamin D (OSCAL WITH D) 500-200 MG-UNIT per tablet 1 tablet  1 tablet Oral Q breakfast [name], MD   1 tablet at 05/26/18 0913  . clopidogrel (PLAVIX) tablet 75 mg  75 mg Oral Daily Amie Portland, MD   75 mg at 05/26/18 0913  . finasteride (PROSCAR) tablet 5 mg  5 mg Oral Daily [name], MD   5 mg at 05/26/18 0913  . insulin aspart (novoLOG) injection 0-9 Units  0-9  Units Subcutaneous TID WC & HS [name], MD   1 Units at 05/26/18 0263  . labetalol (NORMODYNE,TRANDATE) injection 10 mg  10 mg Intravenous Q2H PRN [name], MD   10 mg at 05/25/18 1450  . lisinopril (PRINIVIL,ZESTRIL) tablet 10 mg  10 mg Oral Daily [name], MD   10 mg at 05/26/18 0914  . senna-docusate (Senokot-S) tablet 1 tablet  1 tablet Oral QHS PRN Amie Portland, MD         Discharge Medications: Please see discharge summary for a list of discharge medications.  Relevant Imaging Results:  Relevant Lab Results:   Additional Information SS#: [id number]  Geralynn Ochs, LCSW

## 2018-05-26 NOTE — Care Management Important Message (Signed)
Important Message  Patient Details  Name: Ronald [name] MRN: [id number] Date of Birth: 07/02/1945   Medicare Important Message Given:  Yes    [name] Montine Circle 05/26/2018, 1:56 PM

## 2018-05-26 NOTE — Progress Notes (Signed)
Pt's brother Barbaraann Rondo is here to pick up pt. Pt alert and follow commands. Brother has clothes for pt.  Pt is transported via Database administrator. Pt's BSC, walker and belongings are taken with pt. Discharged summary discuss with Barbaraann Rondo and verbalized understanding to pick up med at pharmacist listed and follow up appointments.

## 2018-05-26 NOTE — Progress Notes (Signed)
CM met with the patients sister and brother and they are concerned about patient returning to his current living situation. He lives with another brother and spouse. They have attempted to get him into [facility] in the past but didn't get the process completed. CSW consulted and will send FL2 to [facility] to assist in the process. Per sister, [facility] will have a bed for him in about a week to week and a half.  Pt doesn't qualify for rehab in SNF prior to ALF. He is doing to functionally well. Family aware. They are reluctantly agreeable to having him d/c home with HH services until he can get into [facility]. CM inquired about him discharging to either the sisters home or the other brothers home and they both state he can not do that.  Choice provided for HH services and [facility] selected. Ellen with [facility] accepted the referral. Pt with orders for 3 in 1 and walker. James with [facility] will deliver to the room. Family to provide transport home. 

## 2018-05-26 NOTE — Progress Notes (Signed)
Occupational Therapy Treatment Patient Details Name: Ronald [name] MRN: [id number] DOB: 16-Jan-1946 Today's Date: 05/26/2018    History of present illness 73 yo male admitted after sudden onset of unresponsiveness on 2/25, CT and MRI showing no acute stroke. 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 Pt participating in Pill Box Test to further assess pt's higher level cognition including ability to manage iADL tasks such as medication management. Pt requiring increased time and with increased number of errors during task (see ADL section below for detail). Suspect increase in errors is partly due to pt's unfamiliarity with use of pillbox at baseline and pt without his reading glasses requiring assist to read labels today. Discussed with pt/pt's brother need to have brother provide additional supervision with iADL tasks such as managing medications to increase safety with task completion, both verbalizing understanding and agreement. Will continue per POC.   Follow Up Recommendations  No OT follow up;Supervision - Intermittent    Equipment Recommendations  None recommended by OT          Precautions / Restrictions Precautions Precautions: Fall Restrictions Weight Bearing Restrictions: No       Mobility Bed Mobility Overal bed mobility: Needs Assistance Bed Mobility: Supine to Sit     Supine to sit: Min guard;HOB elevated     General bed mobility comments: OOB upon arrival  Transfers Overall transfer level: Needs assistance Equipment used: Rolling walker (2 wheeled) Transfers: Sit to/from Stand Sit to Stand: Min guard         General transfer comment: VCs safe hand placement; minguard for safety and immediate standing balance    Balance Overall balance assessment:  Mild deficits observed, not formally tested                                         ADL either performed or assessed with clinical judgement   ADL Overall ADL's : Needs assistance/impaired     Grooming: Wash/dry hands;Min Dispensing optician: Ambulation;RW;Min Government social research officer Details (indicate cue type and reason): some assist for descent to lower toilet height Toileting- Clothing Manipulation and Hygiene: Minimal assistance;Sit to/from stand Toileting - Clothing Manipulation Details (indicate cue type and reason): for gown management; pt performing peri-care, cues for thoroughness     Functional mobility during ADLs: Min guard;Minimal assistance;Rolling walker General ADL Comments: focus of session on completion of Pill Box Test to further assess pt's higher level cognitive needs. Pt required >63min to perform task, requires some assist for task completion as he did not have his reading glasses to read medciation labels. Pt was able to point out to therapist where instructions were on pill bottle to read aloud. Pt with approx 20 errors after task completion (3 or more errors is a failing score); given pt has cognitive impairments at baseline feel increase in errors is partly due to novelty of task using pillbox as he typically does not use one. Discussed with pt benefits of using pillbox at home and recommend pt have brother provide additional supervision for managing medications/setting up pillbox if using to increase safety with taking medications at home; pt/pt brother verbalizing understanding and  agreement.      Vision       Perception     Praxis      Cognition Arousal/Alertness: Awake/alert Behavior During Therapy: WFL for tasks assessed/performed Overall Cognitive Status: History of cognitive impairments - at baseline                                          Exercises      Shoulder Instructions       General Comments      Pertinent Vitals/ Pain       Pain Assessment: No/denies pain  Home Living                                          Prior Functioning/Environment              Frequency  Min 2X/week        Progress Toward Goals  OT Goals(current goals can now be found in the care plan section)     Acute Rehab OT Goals Patient Stated Goal: return home OT Goal Formulation: With patient Time For Goal Achievement: 06/07/18 Potential to Achieve Goals: Good ADL Goals Pt Will Perform Grooming: with modified independence;standing Pt Will Perform Lower Body Bathing: with modified independence;sit to/from stand Pt Will Perform Lower Body Dressing: with modified independence;sit to/from stand Pt Will Transfer to Toilet: with modified independence;ambulating Pt Will Perform Tub/Shower Transfer: Tub transfer;ambulating;rolling walker;tub bench;with modified independence Pt/caregiver will Perform Home Exercise Program: Increased strength;Left upper extremity;With written HEP provided;With theraband  Plan Discharge plan remains appropriate    Co-evaluation                 AM-PAC OT "6 Clicks" Daily Activity     Outcome Measure   Help from another person eating meals?: None Help from another person taking care of personal grooming?: A Little Help from another person toileting, which includes using toliet, bedpan, or urinal?: A Little Help from another person bathing (including washing, rinsing, drying)?: A Little Help from another person to put on and taking off regular upper body clothing?: None Help from another person to put on and taking off regular lower body clothing?: A Little 6 Click Score: 20    End of Session Equipment Utilized During Treatment: Gait belt;Rolling walker  OT Visit Diagnosis: Muscle weakness (generalized) (M62.81)   Activity Tolerance Patient tolerated treatment well   Patient Left in  chair;with call bell/phone within reach;with chair alarm set;with family/visitor present   Nurse Communication Mobility status        Time: 9179-1505 OT Time Calculation (min): 29 min  Charges: OT General Charges $OT Visit: 1 Visit OT Treatments $Self Care/Home Management : 23-37 mins $Cognitive Funtion inital: Initial 15 mins $Cognitive Funtion additional: Additional15 mins  Lou Cal, OT Supplemental Rehabilitation Services Pager [phone] Office [phone]    Raymondo Band 05/26/2018, 4:10 PM

## 2018-05-26 NOTE — NC FL2 (Signed)
MEDICAID FL2 LEVEL OF CARE SCREENING TOOL     IDENTIFICATION  Patient Name: Ronald [name] Birthdate: February 07, 1946 Sex: male Admission Date (Current Location): 05/23/2018  [facility] and [place] Number:  Herbalist and Address:  [facility] [place], [place] [address] [place], [place] [address]      Provider Number: [id number]  Attending Physician Name and Address:  [name], MD  Relative Name and Phone Number:       Current Level of Care: Hospital Recommended Level of Care: [place] Prior Approval Number:    Date Approved/Denied:   PASRR Number:    Discharge Plan: Other (Comment)(ALF)    Current Diagnoses: Patient Active Problem List   Diagnosis Date Noted  . Hypotension 05/26/2018  . Right carotid artery occlusion 05/26/2018  . History of completed stroke 05/26/2018  . Obesity 05/26/2018  . TIA (transient ischemic attack) 05/23/2018  . Cerebrovascular accident (CVA) due to occlusion of right carotid artery ([place])   . Cerebral thrombosis with cerebral infarction 07/20/2016  . Non-traumatic rhabdomyolysis   . Bronchitis 07/15/2016  . Dementia ([place]) 07/15/2016  . CAP (community acquired pneumonia) 07/15/2016  . Acute left hemiparesis ([place]) 07/15/2016  . Abnormal urinalysis 07/15/2016  . Effusion of left knee 07/15/2016  . Elevated AST (SGOT) 07/15/2016  . Elevated troponin 07/15/2016  . Lobar pneumonia ([place])   . Prostate cancer ([place]) 10/21/2015  . Other seasonal allergic rhinitis 10/21/2015  . Essential hypertension 01/16/2014  . Bipolar 1 disorder ([place]) 08/02/2013  . GERD (gastroesophageal reflux disease) 07/04/2013  . Elevated PSA, less than 10 ng/ml 07/04/2013  . BPH with elevated PSA 02/28/2013  . Encounter for Medicare annual wellness exam 02/28/2013  . Hyperlipidemia 02/28/2013    Orientation RESPIRATION BLADDER Height & Weight     Self, Time, Situation, Place  Normal Continent Weight: 205 lb 0.4 oz  (93 kg) Height:  5\' 7"  (170.2 cm)  BEHAVIORAL SYMPTOMS/MOOD NEUROLOGICAL BOWEL NUTRITION STATUS      Continent Diet(no added salt)  AMBULATORY STATUS COMMUNICATION OF NEEDS Skin   Limited Assist Verbally Normal                       Personal Care Assistance Level of Assistance  Bathing, Feeding, Dressing Bathing Assistance: Limited assistance Feeding assistance: Independent Dressing Assistance: Limited assistance     Functional Limitations Info  Sight, Hearing, Speech Sight Info: Adequate Hearing Info: Adequate Speech Info: Adequate    SPECIAL CARE FACTORS FREQUENCY                       Contractures Contractures Info: Not present    Additional Factors Info  Code Status, Allergies, Insulin Sliding Scale Code Status Info: Full Allergies Info: Dust Mite Extract, Pollen Extract, Rye Grass Flower Pollen Extract Gramineae Pollens   Insulin Sliding Scale Info: 0-9 units 3x/day with meals and at bedtime       Current Medications (05/26/2018):  This is the current hospital active medication list Current Facility-Administered Medications  Medication Dose Route Frequency Provider Last Rate Last Dose  .  stroke: mapping our early stages of recovery book   Does not apply Once Amie Portland, MD      . 0.9 %  sodium chloride infusion  250 mL Intravenous Continuous Arnell Asal, NP   Stopped at 05/23/18 2045  . acetaminophen (TYLENOL) tablet 650 mg  650 mg Oral Q4H PRN Amie Portland, MD      .  atorvastatin (LIPITOR) tablet 80 mg  80 mg Oral q1800 [name], MD   80 mg at 05/25/18 1755  . calcium-vitamin D (OSCAL WITH D) 500-200 MG-UNIT per tablet 1 tablet  1 tablet Oral Q breakfast [name], MD   1 tablet at 05/26/18 0913  . clopidogrel (PLAVIX) tablet 75 mg  75 mg Oral Daily Amie Portland, MD   75 mg at 05/26/18 0913  . finasteride (PROSCAR) tablet 5 mg  5 mg Oral Daily [name], MD   5 mg at 05/26/18 0913  . insulin aspart (novoLOG) injection 0-9 Units  0-9  Units Subcutaneous TID WC & HS [name], MD   1 Units at 05/26/18 2440  . labetalol (NORMODYNE,TRANDATE) injection 10 mg  10 mg Intravenous Q2H PRN [name], MD   10 mg at 05/25/18 1450  . lisinopril (PRINIVIL,ZESTRIL) tablet 10 mg  10 mg Oral Daily [name], MD   10 mg at 05/26/18 0914  . senna-docusate (Senokot-S) tablet 1 tablet  1 tablet Oral QHS PRN Amie Portland, MD         Discharge Medications: TAKE these medications       atorvastatin 80 MG tablet Commonly known as:  LIPITOR Take 80 mg by mouth daily. What changed:  Another medication with the same name was removed. Continue taking this medication, and follow the directions you see here.   Calcium-Vitamin D 600-200 MG-UNIT tablet Take 1 tablet by mouth daily.   clopidogrel 75 MG tablet Commonly known as:  PLAVIX Take 1 tablet (75 mg total) by mouth daily.   finasteride 5 MG tablet Commonly known as:  PROSCAR Take 1 tablet (5 mg total) by mouth daily.   fluticasone 50 MCG/ACT nasal spray Commonly known as:  FLONASE Place 1 spray into both nostrils daily.   lisinopril 20 MG tablet Commonly known as:  PRINIVIL,ZESTRIL Take 1 tablet (20 mg total) by mouth daily. What changed:    medication strength  how much to take     Relevant Imaging Results:  Relevant Lab Results:   Additional Information SS#: [id number]  Geralynn Ochs, LCSW

## 2018-05-26 NOTE — Progress Notes (Signed)
Physical Therapy Treatment Patient Details Name: Ronald [name] MRN: [id number] DOB: 04/18/1945 Today's Date: 05/26/2018    History of Present Illness Pt is a 73 yo male admitted after sudden onset of unresponsiveness on 2/25, CT and MRI showing no acute stroke. 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 Pt's family expressing a great deal of concern re: pt d/c'ing home today. Pt's family is concerned about him falling at home and the conditions that he lives in, as well as not seeming to take care of himself appropriately. PT updating recommendations for HHPT home safety assessment and evaluation upon d/c.     Follow Up Recommendations  Home health PT;Other (comment)(for home safety assessment)     Equipment Recommendations  Rolling walker with 5" wheels    Recommendations for Other Services       Precautions / Restrictions Precautions Precautions: Fall Restrictions Weight Bearing Restrictions: No    Mobility  Bed Mobility Overal bed mobility: Needs Assistance Bed Mobility: Supine to Sit     Supine to sit: Min guard;HOB elevated     General bed mobility comments: pt OOB in recliner chair upon arrival  Transfers Overall transfer level: Needs assistance Equipment used: Rolling walker (2 wheeled) Transfers: Sit to/from Stand Sit to Stand: Supervision         General transfer comment: supervision for safety  Ambulation/Gait Ambulation/Gait assistance: Min guard Gait Distance (Feet): 30 Feet Assistive device: Rolling walker (2 wheeled) Gait Pattern/deviations: Step-through pattern;Decreased stride length Gait velocity: decreased   General Gait Details: pt with cautious gait with RW, good safety awareness, no LOB or instability   Stairs              Wheelchair Mobility    Modified Rankin (Stroke Patients Only) Modified Rankin (Stroke Patients Only) Pre-Morbid Rankin Score: No significant disability Modified Rankin: Slight disability     Balance Overall balance assessment: Mild deficits observed, not formally tested                                          Cognition Arousal/Alertness: Awake/alert Behavior During Therapy: WFL for tasks assessed/performed Overall Cognitive Status: History of cognitive impairments - at baseline                                        Exercises      General Comments        Pertinent Vitals/Pain Pain Assessment: No/denies pain    Home Living                      Prior Function            PT Goals (current goals can now be found in the care plan section) Acute Rehab PT Goals Patient Stated Goal: return home PT Goal Formulation: With patient Time For Goal Achievement: 05/31/18 Potential to Achieve Goals: Good Progress towards PT goals: Progressing toward goals    Frequency    Min 4X/week      PT Plan Discharge plan needs to be updated    Co-evaluation  AM-PAC PT "6 Clicks" Mobility   Outcome Measure  Help needed turning from your back to your side while in a flat bed without using bedrails?: None Help needed moving from lying on your back to sitting on the side of a flat bed without using bedrails?: None Help needed moving to and from a bed to a chair (including a wheelchair)?: None Help needed standing up from a chair using your arms (e.g., wheelchair or bedside chair)?: None Help needed to walk in hospital room?: None Help needed climbing 3-5 steps with a railing? : A Little 6 Click Score: 23    End of Session   Activity Tolerance: Patient tolerated treatment well Patient left: in chair;with call bell/phone within reach;with chair alarm set Nurse Communication: Mobility status PT Visit Diagnosis:  Other abnormalities of gait and mobility (R26.89);Other symptoms and signs involving the nervous system (R29.898)     Time: 7282-0601 PT Time Calculation (min) (ACUTE ONLY): 19 min  Charges:  $Gait Training: 8-22 mins                     Sherie Don, [place], DPT  Acute Rehabilitation Services Pager [phone] Office [place] 05/26/2018, 1:09 PM

## 2018-05-26 NOTE — Progress Notes (Addendum)
Occupational Therapy Treatment Patient Details Name: Ronald [name] MRN: [id number] DOB: March 15, 1946 Today's Date: 05/26/2018    History of present illness 73 yo male admitted after sudden onset of unresponsiveness on 2/25, CT and MRI showing no acute stroke. 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 Pt performing room level mobility using RW, standing grooming ADL with overall minguard assist, completing toileting task during session with minA and cues for thoroughness with peri-care. Pt sister present during session and expressing concerns regarding pt's safety at home with family and his ability to care for himself thoroughly, verbalized these concerns to RN/case manager. Pt's sister and pt also verbalizing that he manages his own medications at home. Plan to follow up to further assess pt cognition and ability to perform higher level cognitive tasks such as medication management. Will continue to follow acutely.    Follow Up Recommendations  No OT follow up;Supervision - Intermittent    Equipment Recommendations  None recommended by OT          Precautions / Restrictions Precautions Precautions: Fall Restrictions Weight Bearing Restrictions: No       Mobility Bed Mobility Overal bed mobility: Needs Assistance Bed Mobility: Supine to Sit     Supine to sit: Min guard;HOB elevated     General bed mobility comments: use of bedrail, increased effort but no assist required  Transfers Overall transfer level: Needs assistance Equipment used: Rolling walker (2 wheeled) Transfers: Sit to/from Stand Sit to Stand: Min guard         General transfer comment: VCs safe hand placement; minguard for safety and immediate standing balance    Balance  Overall balance assessment: Mild deficits observed, not formally tested                                         ADL either performed or assessed with clinical judgement   ADL Overall ADL's : Needs assistance/impaired     Grooming: Wash/dry hands;Min Dispensing optician: Ambulation;RW;Min Government social research officer Details (indicate cue type and reason): some assist for descent to lower toilet height Toileting- Clothing Manipulation and Hygiene: Minimal assistance;Sit to/from stand Toileting - Clothing Manipulation Details (indicate cue type and reason): for gown management; pt performing peri-care, cues for thoroughness     Functional mobility during ADLs: Min guard;Minimal assistance;Rolling walker       Vision       Perception     Praxis      Cognition Arousal/Alertness: Awake/alert Behavior During Therapy: WFL for tasks assessed/performed Overall Cognitive Status: History of cognitive impairments - at baseline                                          Exercises     Shoulder Instructions       General Comments      Pertinent Vitals/ Pain       Pain Assessment: No/denies pain  Home Living  Prior Functioning/Environment              Frequency  Min 2X/week        Progress Toward Goals  OT Goals(current goals can now be found in the care plan section)     Acute Rehab OT Goals Patient Stated Goal: return home OT Goal Formulation: With patient Time For Goal Achievement: 06/07/18 Potential to Achieve Goals: Good ADL Goals Pt Will Perform Grooming: with modified independence;standing Pt Will Perform Lower Body Bathing: with modified independence;sit to/from stand Pt Will Perform Lower Body Dressing: with modified independence;sit to/from stand Pt Will Transfer to Toilet: with modified  independence;ambulating Pt Will Perform Tub/Shower Transfer: Tub transfer;ambulating;rolling walker;tub bench;with modified independence Pt/caregiver will Perform Home Exercise Program: Increased strength;Left upper extremity;With written HEP provided;With theraband  Plan Discharge plan remains appropriate    Co-evaluation                 AM-PAC OT "6 Clicks" Daily Activity     Outcome Measure   Help from another person eating meals?: None Help from another person taking care of personal grooming?: A Little Help from another person toileting, which includes using toliet, bedpan, or urinal?: A Little Help from another person bathing (including washing, rinsing, drying)?: A Little Help from another person to put on and taking off regular upper body clothing?: None Help from another person to put on and taking off regular lower body clothing?: A Little 6 Click Score: 20    End of Session Equipment Utilized During Treatment: Gait belt;Rolling walker  OT Visit Diagnosis: Muscle weakness (generalized) (M62.81)   Activity Tolerance Patient tolerated treatment well   Patient Left in chair;with call bell/phone within reach;with chair alarm set;with family/visitor present   Nurse Communication Mobility status        Time: 0900-0930 OT Time Calculation (min): 30 min  Charges: OT General Charges $OT Visit: 1 Visit OT Treatments $Self Care/Home Management : 23-37 mins  Lou Cal, OT Supplemental Rehabilitation Services Pager [phone] Office [phone]     Raymondo Band 05/26/2018, 3:53 PM

## 2018-05-26 NOTE — NC FL2 (Signed)
[place] MEDICAID [id number] LEVEL OF CARE SCREENING TOOL     IDENTIFICATION  Patient Name: Ronald [name] Birthdate: Dec 07, 1945 Sex: male Admission Date (Current Location): 05/23/2018  [facility] and [place] Number:  Herbalist and Address:  [facility] [place] [facility], [place] [address] [place], [place] [address]      Provider Number: [id number]  Attending Physician Name and Address:  [name], MD  Relative Name and Phone Number:       Current Level of Care: Home Recommended Level of Care: [place] Prior Approval Number:    Date Approved/Denied:   PASRR Number:    Discharge Plan: Other (Comment)(ALF)    Current Diagnoses: Patient Active Problem List   Diagnosis Date Noted  . Hypotension 05/26/2018  . Right carotid artery occlusion 05/26/2018  . History of completed stroke 05/26/2018  . Obesity 05/26/2018  . TIA (transient ischemic attack) 05/23/2018  . Cerebrovascular accident (CVA) due to occlusion of right carotid artery ([employer])   . Cerebral thrombosis with cerebral infarction 07/20/2016  . Non-traumatic rhabdomyolysis   . Bronchitis 07/15/2016  . Dementia ([place]) 07/15/2016  . CAP (community acquired pneumonia) 07/15/2016  . Acute left hemiparesis ([place]) 07/15/2016  . Abnormal urinalysis 07/15/2016  . Effusion of left knee 07/15/2016  . Elevated AST (SGOT) 07/15/2016  . Elevated troponin 07/15/2016  . Lobar pneumonia ([place])   . Prostate cancer ([place]) 10/21/2015  . Other seasonal allergic rhinitis 10/21/2015  . Essential hypertension 01/16/2014  . Bipolar 1 disorder ([place]) 08/02/2013  . GERD (gastroesophageal reflux disease) 07/04/2013  . Elevated PSA, less than 10 ng/ml 07/04/2013  . BPH with elevated PSA 02/28/2013  . Encounter for Medicare annual wellness exam 02/28/2013  . Hyperlipidemia 02/28/2013    Orientation RESPIRATION BLADDER Height & Weight     Self, Time, Situation, Place  Normal Continent Weight: 205 lb 0.4 oz (93  kg) Height:  5\' 7"  (170.2 cm)  BEHAVIORAL SYMPTOMS/MOOD NEUROLOGICAL BOWEL NUTRITION STATUS      Continent Diet(no added salt)  AMBULATORY STATUS COMMUNICATION OF NEEDS Skin   Limited Assist Verbally Normal                       Personal Care Assistance Level of Assistance  Bathing, Feeding, Dressing Bathing Assistance: Limited assistance Feeding assistance: Independent Dressing Assistance: Limited assistance     Functional Limitations Info  Sight, Hearing, Speech Sight Info: Adequate Hearing Info: Adequate Speech Info: Adequate    SPECIAL CARE FACTORS FREQUENCY                       Contractures Contractures Info: Not present    Additional Factors Info  Code Status, Allergies, Insulin Sliding Scale Code Status Info: Full Allergies Info: Dust Mite Extract, Pollen Extract, Rye Grass Flower Pollen Extract Gramineae Pollens   Insulin Sliding Scale Info: 0-9 units 3x/day with meals and at bedtime       Current Medications (05/26/2018):  This is the current hospital active medication list Current Facility-Administered Medications  Medication Dose Route Frequency Provider Last Rate Last Dose  .  stroke: mapping our early stages of recovery book   Does not apply Once Amie Portland, MD      . 0.9 %  sodium chloride infusion  250 mL Intravenous Continuous Arnell Asal, NP   Stopped at 05/23/18 2045  . acetaminophen (TYLENOL) tablet 650 mg  650 mg Oral Q4H PRN Amie Portland, MD      .  atorvastatin (LIPITOR) tablet 80 mg  80 mg Oral q1800 [name], MD   80 mg at 05/25/18 1755  . calcium-vitamin D (OSCAL WITH D) 500-200 MG-UNIT per tablet 1 tablet  1 tablet Oral Q breakfast [name], MD   1 tablet at 05/26/18 0913  . clopidogrel (PLAVIX) tablet 75 mg  75 mg Oral Daily Amie Portland, MD   75 mg at 05/26/18 0913  . finasteride (PROSCAR) tablet 5 mg  5 mg Oral Daily [name], MD   5 mg at 05/26/18 0913  . insulin aspart (novoLOG) injection 0-9 Units  0-9 Units  Subcutaneous TID WC & HS [name], MD   1 Units at 05/26/18 8182  . labetalol (NORMODYNE,TRANDATE) injection 10 mg  10 mg Intravenous Q2H PRN [name], MD   10 mg at 05/25/18 1450  . lisinopril (PRINIVIL,ZESTRIL) tablet 10 mg  10 mg Oral Daily [name], MD   10 mg at 05/26/18 0914  . senna-docusate (Senokot-S) tablet 1 tablet  1 tablet Oral QHS PRN Amie Portland, MD         Discharge Medications: TAKE these medications       atorvastatin 80 MG tablet Commonly known as:  LIPITOR Take 80 mg by mouth daily. What changed:  Another medication with the same name was removed. Continue taking this medication, and follow the directions you see here.   Calcium-Vitamin D 600-200 MG-UNIT tablet Take 1 tablet by mouth daily.   clopidogrel 75 MG tablet Commonly known as:  PLAVIX Take 1 tablet (75 mg total) by mouth daily.   finasteride 5 MG tablet Commonly known as:  PROSCAR Take 1 tablet (5 mg total) by mouth daily.   fluticasone 50 MCG/ACT nasal spray Commonly known as:  FLONASE Place 1 spray into both nostrils daily.   lisinopril 20 MG tablet Commonly known as:  PRINIVIL,ZESTRIL Take 1 tablet (20 mg total) by mouth daily. What changed:    medication strength  how much to take     Relevant Imaging Results:  Relevant Lab Results:   Additional Information SS#: [id number]  Geralynn Ochs, LCSW

## 2018-05-26 NOTE — Discharge Summary (Addendum)
Stroke Discharge Summary  Patient ID: Ronald [name]   MRN: [id number]      DOB: Nov 09, 1945  Date of Admission: 05/23/2018 Date of Discharge: 05/26/2018  Attending Physician:  [name], MD, Stroke MD Consultant(s):    pulmonary/intensive care  Patient's PCP:  Colonel Bald, MD  DISCHARGE DIAGNOSIS:  Principal Problem:   TIA (transient ischemic attack) Active Problems:   Hyperlipidemia   Bipolar 1 disorder ([place])   Essential hypertension   Prostate cancer ([place])   Hypotension   Right carotid artery occlusion   History of completed stroke   Obesity   Past Medical History:  Diagnosis Date  . Cancer [facility])    Prostate  . Chicken pox   . Dementia ([place])   . Depression   . Heart disease   . Hyperlipemia   . Hypertension   . Measles   . Mumps   . 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 Stroke [facility])    Past Surgical History:  Procedure Laterality Date  . IR ANGIO EXTERNAL CAROTID SEL EXT CAROTID UNI R MOD SED  07/20/2016  . IR ANGIO INTRA EXTRACRAN SEL INTERNAL CAROTID BILAT MOD SED  07/20/2016  . IR ANGIO VERTEBRAL SEL SUBCLAVIAN INNOMINATE UNI L MOD SED  07/20/2016  . IR ANGIOGRAM EXTREMITY RIGHT  07/20/2016    Allergies as of 05/26/2018      Reactions   Dust Mite Extract Other (See Comments)   Allergy symptoms   Pollen Extract Other (See Comments)   Allergy symptoms   Rye Grass Flower Pollen Extract [gramineae Pollens] Other (See Comments)   Allergy Symptoms      Medication List    STOP taking these medications   ARIPiprazole 10 MG tablet Commonly known as:  ABILIFY   cloNIDine 0.1 MG tablet Commonly known as:  CATAPRES   metoprolol tartrate 50 MG tablet Commonly known as:  LOPRESSOR     TAKE these medications   atorvastatin 80 MG tablet Commonly known as:  LIPITOR Take 80 mg by mouth daily. What changed:  Another medication with the same name was removed. Continue taking this medication, and follow the directions you see  here.   Calcium-Vitamin D 600-200 MG-UNIT tablet Take 1 tablet by mouth daily.   clopidogrel 75 MG tablet Commonly known as:  PLAVIX Take 1 tablet (75 mg total) by mouth daily.   finasteride 5 MG tablet Commonly known as:  PROSCAR Take 1 tablet (5 mg total) by mouth daily.   fluticasone 50 MCG/ACT nasal spray Commonly known as:  FLONASE Place 1 spray into both nostrils daily.   lisinopril 20 MG tablet Commonly known as:  PRINIVIL,ZESTRIL Take 1 tablet (20 mg total) by mouth daily. What changed:    medication strength  how much to take            Durable Medical Equipment  (From admission, onward)         Start     Ordered   05/26/18 1304  For home use only DME 3 n 1  Once     05/26/18 1303   05/26/18 1304  For home use only DME Walker rolling  Once    Question:  Patient needs a walker to treat with the following condition  Answer:  Cerebral hypoperfusion   05/26/18 1303          LABORATORY STUDIES CBC    Component Value Date/Time   WBC 5.8 05/25/2018 0546   RBC  4.04 (L) 05/25/2018 0546   HGB 11.3 (L) 05/25/2018 0546   HCT 36.9 (L) 05/25/2018 0546   PLT 239 05/25/2018 0546   MCV 91.3 05/25/2018 0546   MCH 28.0 05/25/2018 0546   MCHC 30.6 05/25/2018 0546   RDW 13.9 05/25/2018 0546   LYMPHSABS 1.7 05/23/2018 1840   MONOABS 0.5 05/23/2018 1840   EOSABS 0.0 05/23/2018 1840   BASOSABS 0.0 05/23/2018 1840   CMP    Component Value Date/Time   NA 139 05/25/2018 0546   K 4.1 05/25/2018 0546   CL 106 05/25/2018 0546   CO2 26 05/25/2018 0546   GLUCOSE 104 (H) 05/25/2018 0546   BUN 12 05/25/2018 0546   CREATININE 0.79 05/25/2018 0546   CREATININE 0.99 10/15/2013 0755   CALCIUM 8.9 05/25/2018 0546   PROT 6.0 (L) 05/23/2018 1840   ALBUMIN 3.1 (L) 05/24/2018 0008   AST 42 (H) 05/23/2018 1840   ALT 26 05/23/2018 1840   ALKPHOS 86 05/23/2018 1840   BILITOT 1.7 (H) 05/23/2018 1840   GFRNONAA >60 05/25/2018 0546   GFRAA >60 05/25/2018 0546    COAGS Lab Results  Component Value Date   INR 1.2 05/23/2018   INR 1.10 07/20/2016   Lipid Panel    Component Value Date/Time   CHOL 122 05/24/2018 0008   TRIG 72 05/24/2018 0008   HDL 34 (L) 05/24/2018 0008   CHOLHDL 3.6 05/24/2018 0008   VLDL 14 05/24/2018 0008   LDLCALC 74 05/24/2018 0008   HgbA1C  Lab Results  Component Value Date   HGBA1C 5.1 05/24/2018   Urinalysis    Component Value Date/Time   COLORURINE COLORLESS (A) 05/24/2018 1600   APPEARANCEUR CLEAR 05/24/2018 1600   LABSPEC 1.003 (L) 05/24/2018 1600   PHURINE 6.0 05/24/2018 1600   GLUCOSEU NEGATIVE 05/24/2018 1600   GLUCOSEU NEGATIVE 04/18/2015 0820   HGBUR NEGATIVE 05/24/2018 1600   BILIRUBINUR NEGATIVE 05/24/2018 1600   KETONESUR NEGATIVE 05/24/2018 1600   PROTEINUR NEGATIVE 05/24/2018 1600   UROBILINOGEN 0.2 04/18/2015 0820   NITRITE NEGATIVE 05/24/2018 1600   LEUKOCYTESUR NEGATIVE 05/24/2018 1600   Urine Drug Screen     Component Value Date/Time   LABOPIA NONE DETECTED 05/24/2018 1600   COCAINSCRNUR NONE DETECTED 05/24/2018 1600   LABBENZ NONE DETECTED 05/24/2018 1600   AMPHETMU NONE DETECTED 05/24/2018 1600   THCU NONE DETECTED 05/24/2018 1600   LABBARB NONE DETECTED 05/24/2018 1600    Alcohol Level    Component Value Date/Time   [facility] <5 07/14/2016 1952     SIGNIFICANT DIAGNOSTIC STUDIES Ct Angio Head W Or Wo Contrast  Result Date: 05/23/2018 CLINICAL DATA:  Altered mental status.  Focal neurologic deficit. EXAM: CT ANGIOGRAPHY HEAD AND NECK CT PERFUSION BRAIN TECHNIQUE: Multidetector CT imaging of the head and neck was performed using the standard protocol during bolus administration of intravenous contrast. Multiplanar CT image reconstructions and MIPs were obtained to evaluate the vascular anatomy. Carotid stenosis measurements (when applicable) are obtained utilizing NASCET criteria, using the distal internal carotid diameter as the denominator. Multiphase CT imaging of the brain was  performed following IV bolus contrast injection. Subsequent parametric perfusion maps were calculated using RAPID software. CONTRAST:  162mL ISOVUE-370 IOPAMIDOL (ISOVUE-370) INJECTION 76% COMPARISON:  Head CT 05/23/2018 FINDINGS: CTA NECK FINDINGS SKELETON: There is no bony spinal canal stenosis. No lytic or blastic lesion. OTHER NECK: Normal pharynx, larynx and major salivary glands. No cervical lymphadenopathy. Unremarkable thyroid gland. UPPER CHEST: No pneumothorax or pleural effusion. No nodules or masses. AORTIC  ARCH: There is no calcific atherosclerosis of the aortic arch. There is no aneurysm, dissection or hemodynamically significant stenosis of the visualized ascending aorta and aortic arch. Conventional 3 vessel aortic branching pattern. The visualized proximal subclavian arteries are widely patent. RIGHT CAROTID SYSTEM: --Common carotid artery: Widely patent origin without common carotid artery dissection or aneurysm. 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 No reconstitution to the skull base. Compared to the CTA from 07/18/2016 the occlusion has worsened from a previously severe stenosis. --External carotid artery: No acute abnormality. LEFT CAROTID SYSTEM: --Common carotid artery: Widely patent origin without common carotid artery dissection or aneurysm. --Internal carotid artery: Normal without aneurysm, dissection or stenosis. --External carotid artery: No acute abnormality. VERTEBRAL ARTERIES: Left dominant configuration. Left vertebral artery origin is normal. The right vertebral artery is occluded from its origin to the vertebrobasilar confluence, unchanged. CTA HEAD FINDINGS POSTERIOR CIRCULATION: --Basilar artery: Normal. --Posterior cerebral arteries: Diminished flow in the right PCA, which is predominantly supplied by the right vertebral artery. Left PCA is normal. Small left P-comm. --Superior cerebellar arteries: Normal. --Inferior cerebellar arteries: Right PICA is  opacified via collateral flow across the vertebrobasilar confluence. ANTERIOR CIRCULATION: --Intracranial internal carotid arteries: No flow in the right ICA. Mild calcification along the course of the left ICA. --Anterior cerebral arteries: Normal. Both A1 segments are present. Patent anterior communicating artery. --Middle cerebral arteries: Normal. --Posterior communicating arteries: Present bilaterally. VENOUS SINUSES: Minimal opacification due to contrast timing. ANATOMIC VARIANTS: None DELAYED PHASE: Not performed. Review of the MIP images confirms the above findings. CT Brain Perfusion Findings: CBF (<30%) Volume: 75mL Perfusion (Tmax>6.0s) volume: 375mL Mismatch Volume: 343mL Commentary: Entire right hemisphere is labeled as ischemic penumbra by computerized data analysis. However, this may be due to reduced or delayed perfusion of the right hemisphere, which is supplied entirely by collateral flow, as the vertebral and internal carotid arteries on the right are both occluded. IMPRESSION: 1. 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 The right vertebral occlusion is unchanged 07/18/2016. The right ICA occlusion has progressed from severe stenosis. 2. Poor opacification of the distal right PCA, likely occluded or severely stenotic. No other intracranial occlusion or high-grade stenosis. 3. Perfusion data analysis shows diffuse right hemispheric ischemia without a core infarct. In the context of occluded right internal carotid and vertebral arteries, the elevated Tmax values might be secondary to the right hemisphere being supplied by contralateral collateral flow. Findings might also indicate a hypoperfusion state. These results were called by telephone at the time of interpretation on 05/23/2018 at 7:31 pm to Dr. Amie Portland , who verbally acknowledged these results. Electronically Signed   By: Ulyses Jarred M.D.   On: 05/23/2018 19:33   Ct Angio Neck W Or Wo  Contrast  Result Date: 05/23/2018 CLINICAL DATA:  Altered mental status.  Focal neurologic deficit. EXAM: CT ANGIOGRAPHY HEAD AND NECK CT PERFUSION BRAIN TECHNIQUE: Multidetector CT imaging of the head and neck was performed using the standard protocol during bolus administration of intravenous contrast. Multiplanar CT image reconstructions and MIPs were obtained to evaluate the vascular anatomy. Carotid stenosis measurements (when applicable) are obtained utilizing NASCET criteria, using the distal internal carotid diameter as the denominator. Multiphase CT imaging of the brain was performed following IV bolus contrast injection. Subsequent parametric perfusion maps were calculated using RAPID software. CONTRAST:  154mL ISOVUE-370 IOPAMIDOL (ISOVUE-370) INJECTION 76% COMPARISON:  Head CT 05/23/2018 FINDINGS: CTA NECK FINDINGS SKELETON: There is no bony  spinal canal stenosis. No lytic or blastic lesion. OTHER NECK: Normal pharynx, larynx and major salivary glands. No cervical lymphadenopathy. Unremarkable thyroid gland. UPPER CHEST: No pneumothorax or pleural effusion. No nodules or masses. AORTIC ARCH: There is no calcific atherosclerosis of the aortic arch. There is no aneurysm, dissection or hemodynamically significant stenosis of the visualized ascending aorta and aortic arch. Conventional 3 vessel aortic branching pattern. The visualized proximal subclavian arteries are widely patent. RIGHT CAROTID SYSTEM: --Common carotid artery: Widely patent origin without common carotid artery dissection or aneurysm. 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 No reconstitution to the skull base. Compared to the CTA from 07/18/2016 the occlusion has worsened from a previously severe stenosis. --External carotid artery: No acute abnormality. LEFT CAROTID SYSTEM: --Common carotid artery: Widely patent origin without common carotid artery dissection or aneurysm. --Internal carotid artery: Normal without  aneurysm, dissection or stenosis. --External carotid artery: No acute abnormality. VERTEBRAL ARTERIES: Left dominant configuration. Left vertebral artery origin is normal. The right vertebral artery is occluded from its origin to the vertebrobasilar confluence, unchanged. CTA HEAD FINDINGS POSTERIOR CIRCULATION: --Basilar artery: Normal. --Posterior cerebral arteries: Diminished flow in the right PCA, which is predominantly supplied by the right vertebral artery. Left PCA is normal. Small left P-comm. --Superior cerebellar arteries: Normal. --Inferior cerebellar arteries: Right PICA is opacified via collateral flow across the vertebrobasilar confluence. ANTERIOR CIRCULATION: --Intracranial internal carotid arteries: No flow in the right ICA. Mild calcification along the course of the left ICA. --Anterior cerebral arteries: Normal. Both A1 segments are present. Patent anterior communicating artery. --Middle cerebral arteries: Normal. --Posterior communicating arteries: Present bilaterally. VENOUS SINUSES: Minimal opacification due to contrast timing. ANATOMIC VARIANTS: None DELAYED PHASE: Not performed. Review of the MIP images confirms the above findings. CT Brain Perfusion Findings: CBF (<30%) Volume: 62mL Perfusion (Tmax>6.0s) volume: 366mL Mismatch Volume: 318mL Commentary: Entire right hemisphere is labeled as ischemic penumbra by computerized data analysis. However, this may be due to reduced or delayed perfusion of the right hemisphere, which is supplied entirely by collateral flow, as the vertebral and internal carotid arteries on the right are both occluded. IMPRESSION: 1. 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 The right vertebral occlusion is unchanged 07/18/2016. The right ICA occlusion has progressed from severe stenosis. 2. Poor opacification of the distal right PCA, likely occluded or severely stenotic. No other intracranial occlusion or high-grade  stenosis. 3. Perfusion data analysis shows diffuse right hemispheric ischemia without a core infarct. In the context of occluded right internal carotid and vertebral arteries, the elevated Tmax values might be secondary to the right hemisphere being supplied by contralateral collateral flow. Findings might also indicate a hypoperfusion state. These results were called by telephone at the time of interpretation on 05/23/2018 at 7:31 pm to Dr. Amie Portland , who verbally acknowledged these results. Electronically Signed   By: Ulyses Jarred M.D.   On: 05/23/2018 19:33   Mr Brain Wo Contrast  Result Date: 05/24/2018 CLINICAL DATA:  Follow up stroke, RIGHT-sided weakness now resolved. History of hypertension, hyperlipidemia, dementia and stroke. EXAM: MRI HEAD WITHOUT CONTRAST TECHNIQUE: Multiplanar, multiecho pulse sequences of the brain and surrounding structures were obtained without intravenous contrast. COMPARISON:  CT HEAD May 23, 2018 and MRI of the head July 20, 2016 FINDINGS: INTRACRANIAL CONTENTS: No reduced diffusion to suggest acute ischemia. RIGHT frontoparietal to lesser extent RIGHT occipital encephalomalacia with minimal susceptibility artifact. Ex vacuo dilatation RIGHT lateral ventricle. Old small RIGHT  cerebellar infarcts. Moderate parenchymal brain volume loss. No hydrocephalus. Mild RIGHT cerebral peduncle volume loss consistent with wallerian degeneration. No midline shift, mass effect or masses. No abnormal extra-axial fluid collections. VASCULAR: Loss of normal RIGHT internal carotid artery flow void. SKULL AND UPPER CERVICAL SPINE: No abnormal sellar expansion. No suspicious calvarial bone marrow signal. Craniocervical junction maintained. SINUSES/ORBITS: Mild paranasal sinus mucosal thickening without air-fluid levels. Mastoid air cells are well aerated.The included ocular globes and orbital contents are non-suspicious. OTHER: None. IMPRESSION: 1. No acute intracranial process. 2.  Multifocal old RIGHT MCA and posterior border zone territory infarcts. 3. RIGHT ICA occlusion. 4. Moderate parenchymal brain volume loss. Electronically Signed   By: Elon Alas M.D.   On: 05/24/2018 04:13   Ct Cerebral Perfusion W Contrast  Result Date: 05/23/2018 CLINICAL DATA:  Altered mental status.  Focal neurologic deficit. EXAM: CT ANGIOGRAPHY HEAD AND NECK CT PERFUSION BRAIN TECHNIQUE: Multidetector CT imaging of the head and neck was performed using the standard protocol during bolus administration of intravenous contrast. Multiplanar CT image reconstructions and MIPs were obtained to evaluate the vascular anatomy. Carotid stenosis measurements (when applicable) are obtained utilizing NASCET criteria, using the distal internal carotid diameter as the denominator. Multiphase CT imaging of the brain was performed following IV bolus contrast injection. Subsequent parametric perfusion maps were calculated using RAPID software. CONTRAST:  118mL ISOVUE-370 IOPAMIDOL (ISOVUE-370) INJECTION 76% COMPARISON:  Head CT 05/23/2018 FINDINGS: CTA NECK FINDINGS SKELETON: There is no bony spinal canal stenosis. No lytic or blastic lesion. OTHER NECK: Normal pharynx, larynx and major salivary glands. No cervical lymphadenopathy. Unremarkable thyroid gland. UPPER CHEST: No pneumothorax or pleural effusion. No nodules or masses. AORTIC ARCH: There is no calcific atherosclerosis of the aortic arch. There is no aneurysm, dissection or hemodynamically significant stenosis of the visualized ascending aorta and aortic arch. Conventional 3 vessel aortic branching pattern. The visualized proximal subclavian arteries are widely patent. RIGHT CAROTID SYSTEM: --Common carotid artery: Widely patent origin without common carotid artery dissection or aneurysm. 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 No reconstitution to the skull base. Compared to the CTA from 07/18/2016 the occlusion has worsened from a  previously severe stenosis. --External carotid artery: No acute abnormality. LEFT CAROTID SYSTEM: --Common carotid artery: Widely patent origin without common carotid artery dissection or aneurysm. --Internal carotid artery: Normal without aneurysm, dissection or stenosis. --External carotid artery: No acute abnormality. VERTEBRAL ARTERIES: Left dominant configuration. Left vertebral artery origin is normal. The right vertebral artery is occluded from its origin to the vertebrobasilar confluence, unchanged. CTA HEAD FINDINGS POSTERIOR CIRCULATION: --Basilar artery: Normal. --Posterior cerebral arteries: Diminished flow in the right PCA, which is predominantly supplied by the right vertebral artery. Left PCA is normal. Small left P-comm. --Superior cerebellar arteries: Normal. --Inferior cerebellar arteries: Right PICA is opacified via collateral flow across the vertebrobasilar confluence. ANTERIOR CIRCULATION: --Intracranial internal carotid arteries: No flow in the right ICA. Mild calcification along the course of the left ICA. --Anterior cerebral arteries: Normal. Both A1 segments are present. Patent anterior communicating artery. --Middle cerebral arteries: Normal. --Posterior communicating arteries: Present bilaterally. VENOUS SINUSES: Minimal opacification due to contrast timing. ANATOMIC VARIANTS: None DELAYED PHASE: Not performed. Review of the MIP images confirms the above findings. CT Brain Perfusion Findings: CBF (<30%) Volume: 85mL Perfusion (Tmax>6.0s) volume: 328mL Mismatch Volume: 365mL Commentary: Entire right hemisphere is labeled as ischemic penumbra by computerized data analysis. However, this may be due to reduced or delayed perfusion of the right hemisphere, which  is supplied entirely by collateral flow, as the vertebral and internal carotid arteries on the right are both occluded. IMPRESSION: 1. 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 The right  vertebral occlusion is unchanged 07/18/2016. The right ICA occlusion has progressed from severe stenosis. 2. Poor opacification of the distal right PCA, likely occluded or severely stenotic. No other intracranial occlusion or high-grade stenosis. 3. Perfusion data analysis shows diffuse right hemispheric ischemia without a core infarct. In the context of occluded right internal carotid and vertebral arteries, the elevated Tmax values might be secondary to the right hemisphere being supplied by contralateral collateral flow. Findings might also indicate a hypoperfusion state. These results were called by telephone at the time of interpretation on 05/23/2018 at 7:31 pm to Dr. Amie Portland , who verbally acknowledged these results. Electronically Signed   By: Ulyses Jarred M.D.   On: 05/23/2018 19:33   Dg Chest Portable 1 View  Result Date: 05/23/2018 CLINICAL DATA:  Acute onset of RIGHT facial droop and rightward gaze while eating dinner earlier this evening. EXAM: PORTABLE CHEST 1 VIEW COMPARISON:  07/16/2016 and earlier. FINDINGS: Cardiac silhouette upper normal in size to slightly enlarged for AP portable technique, unchanged. Lungs clear. Bronchovascular markings normal. Pulmonary vascularity normal. No visible pleural effusions. No pneumothorax. IMPRESSION: Stable borderline heart size. No acute cardiopulmonary disease. Electronically Signed   By: Evangeline Dakin M.D.   On: 05/23/2018 20:13   Ct Head Code Stroke Wo Contrast  Result Date: 05/23/2018 CLINICAL DATA:  Code stroke. Altered mental status. Last seen normal 17:55 EXAM: CT HEAD WITHOUT CONTRAST TECHNIQUE: Contiguous axial images were obtained from the base of the skull through the vertex without intravenous contrast. COMPARISON:  CTA head neck 07/18/2016 FINDINGS: Brain: There is no mass, hemorrhage or extra-axial collection. The size and configuration of the ventricles and extra-axial CSF spaces are normal. There is hypoattenuation in the right  parietal lobe with ex vacuo dilatation of the posterior right lateral ventricle, likely an old infarct. Vascular: No abnormal hyperdensity of the major intracranial arteries or dural venous sinuses. No intracranial atherosclerosis. Skull: The visualized skull base, calvarium and extracranial soft tissues are normal. Sinuses/Orbits: No fluid levels or advanced mucosal thickening of the visualized paranasal sinuses. No mastoid or middle ear effusion. The orbits are normal. ASPECTS [facility] Stroke Program Early CT Score) - Ganglionic level infarction (caudate, lentiform nuclei, internal capsule, insula, M1-M3 cortex): 7 - Supraganglionic infarction (M4-M6 cortex): 3 Total score (0-10 with 10 being normal): 10 IMPRESSION: 1. No acute hemorrhage. 2. Hypoattenuation in the right parietal lobe, suspected to be an old infarct. 3. ASPECTS is 10. * These results were communicated to Dr. Amie Portland at 6:59 pm on 05/23/2018 by text page via the [facility] messaging system. Electronically Signed   By: Ulyses Jarred M.D.   On: 05/23/2018 19:00   2D Echocardiogram  1. The left ventricle has normal systolic function with an ejection fraction of 60-65%. The cavity size was normal. Left ventricular diastolic parameters were normal.  2. The right ventricle has normal systolic function. The cavity was normal. There is no increase in right ventricular wall thickness.  3. Left atrial size was mildly dilated.  4. The mitral valve is normal in structure. Mild thickening of the mitral valve leaflet.  5. The tricuspid valve is normal in structure.  6. The aortic valve is tricuspid Mild thickening of the aortic valve Mild calcification of the aortic valve. Aortic valve regurgitation is  trivial by color flow Doppler.  7. The pulmonic valve was normal in structure.     HISTORY OF PRESENT ILLNESS Fahad Cisse is a 73 y.o. male who has a past medical history of dementia, depression, hypertension, hyperlipidemia, schizophrenia, stroke  involving the right carotid watershed areas with some residual left-sided weakness, presenting to the emergency room as an acute code stroke via EMS. Patient was eating dinner with family around 5:55 PM on 05/23/2018 ([facility]) when he had a sudden onset of unresponsiveness.  They did not report any seizure-like activity.  EMS was called.  When EMS arrived, he had a rightward gaze and was very lethargic and not following commands.  He needed to be bagged ventilated for a few minutes prior to starting to come around.  Going to EMS, he had some weakness on the right side which has since resolved.  He is able to provide coherent history.  Denies any history of seizures.  Denies any recent illnesses or sicknesses including fevers chills.  Denies any chest pain shortness of breath palpitations nausea vomiting abdominal pain diarrhea constipation. Uses a wheelchair at baseline. EMS reports urinary incontinence and smell of urine on him when they picked him up. tpa was not given d/t low NIHSS. Premorbid modified Rankin scale (mRS): 4. He was admitted to the ICU d/t low BP.   HOSPITAL COURSE Mr. Hamdi Kley is a 73 y.o. male with history of dementia, bipolar disorder, positive cancer under medical treatment, hypertension, hyperlipidemia, obesity, stroke 2 years ago admitted for unresponsive with left-sided weakness. No tPA given due to rapidly improving symptoms.    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  CT head and neck showed right ICA occluded at origin, progressed from 2 years ago which only showed occlusion at ICA siphon.  Unchanged right VA occlusion.  2D Echo EF 60-65%. No source of embolus   EEG normal  LDL 74  HgbA1c 5.1  clopidogrel 75 mg daily prior to admission, now on clopidogrel 75 mg daily.   Therapy recommendations:  no therapy needs. Will add  RN and NA  Per sister, patient difficult to care for at home. Family had been looking into ALF for him. Sister working with CM. ALF bed should be available next week. Will d/c with home health until them.  Disposition: return home  Hypotension History of hypertension  BP low on admission, but now mildly high  Treated with neo in the ICU  BP goal 130-150 given right ICA and right VA occlusion  PTA home BP meds including clonidine, lisinopril and metoprolol  Discharge home on lisinopril 20mg  bid  Avoid low BP  HH RN to monitor BP. May need to resume additional home medications.  Follow up PCP next week  Right ICA occlusion  In 06/2016 CTA showed right ICA occlusion at ophthalmic level with distal reconstitution  This admission CTA showed right ICA occlusion at bifurcation, progressed from 06/2016  BP goal 130-150  Avoid low BP  History of stroke  06/2016 admitted for a fall and difficulty walking.  Also had a cough and fever due to left lower lobe PNA.  MRI showed right MCA/ACA, MCA/PCA infarcts.  CTA head neck showed right ICA occlusion at the ophthalmic level with distal reconstitution, right P1 stenosis and right VA occlusion.  IR also confirmed right ICA occlusion, no intervention offered.  LDL  86 and A1c 5.4, patient discharged with DAPT for 3 months and Lipitor 80.  Hyperlipidemia  Home meds: Lipitor 80  LDL 74, goal < 70  Now on Lipitor 80  Continue statin at discharge  Other Stroke Risk Factors  Advanced age  Obesity, Body mass index is 32.11 kg/m.   Other Active Problems  Bipolar disorder  Prostate cancer   DISCHARGE EXAM Blood pressure (!) 176/86, pulse 82, temperature 98.5 F (36.9 C), temperature source Oral, resp. rate 16, height 5\' 7"  (1.702 m), weight 93 kg, SpO2 98 %. General - Well nourished, well developed, in no apparent distress.  Cardiovascular - Regular rate and rhythm.  Mental Status -  Level of arousal and orientation to  time, place, and person were intact. Language including expression, naming, repetition, comprehension was assessed and found intact. Fund of Knowledge was assessed and was intact.  Cranial Nerves II - XII - II - Visual field intact OU. III, IV, VI - Extraocular movements intact. V - Facial sensation intact bilaterally. VII - Facial movement intact bilaterally. VIII - Hearing & vestibular intact bilaterally. X - Palate elevates symmetrically, mild dysarthria, poor denture. XI - Chin turning & shoulder shrug intact bilaterally. XII - Tongue protrusion intact.  Motor Strength - The patient's strength was normal in all extremities and pronator drift was absent.  Bulk was normal and fasciculations were absent.   Motor Tone - Muscle tone was assessed at the neck and appendages and was normal.  Reflexes - The patient's reflexes were symmetrical in all extremities and he had no pathological reflexes.  Sensory - Light touch, temperature/pinprick were assessed and were symmetrical.    Coordination - The patient had normal movements in the hands and feet with no ataxia or dysmetria.  Tremor was absent.  Gait and Station - deferred.  Discharge Diet   Heart healthy thin liquids  DISCHARGE PLAN  Disposition:  Home  Continue clopidogrel 75 mg daily for secondary stroke prevention.  Ongoing risk factor control by Primary Care Physician at time of discharge  BP goal 130-150. Resume additional home meds as needed to meet goal  Home RN to follow BP. Home NA for personal care needs.   Follow-up Paruchuri, Saunders Glance, MD in 1 week - check BP and adjust meds as appropriate.  Follow-up in [facility] in 4 weeks, office to schedule an appointment.   45 minutes were spent preparing discharge.  Burnetta Sabin, MSN, APRN, ANVP-BC, AGPCNP-BC Advanced Practice Stroke Nurse [place] for Schedule & Pager information 05/26/2018 1:31 PM     ATTENDING NOTE: I reviewed above note and agree with the assessment and plan. Pt was seen and examined.   No acute event overnight, neuro stable.  Patient current event likely due to hypoperfusion in the setting of right ICA occlusion.  Although hypotension on admission, patient started to have hypertension for the last 2 days, resumed his home medication lisinopril but increase dose to 20 mg twice daily.  BP goal 130-150, avoid low BP.  He needs close follow-up with PCP for BP monitoring and medication adjustment if needed.  He will also follow-up with stroke clinic in [place] in 4 weeks.  [name], MD PhD Stroke Neurology 05/26/2018 6:14 PM

## 2018-06-14 ENCOUNTER — Institutional Professional Consult (permissible substitution): Payer: Medicare HMO | Admitting: Neurology

## 2018-08-10 ENCOUNTER — Institutional Professional Consult (permissible substitution): Payer: Self-pay | Admitting: Neurology

## 2018-08-31 ENCOUNTER — Encounter (HOSPITAL_COMMUNITY): Payer: Self-pay | Admitting: Emergency Medicine

## 2018-08-31 ENCOUNTER — Inpatient Hospital Stay (HOSPITAL_COMMUNITY): Payer: Medicare HMO

## 2018-08-31 ENCOUNTER — Encounter (HOSPITAL_COMMUNITY)
Admission: EM | Disposition: A | Discharge status: Skilled Nursing Facility | Payer: Self-pay | Source: Home / Self Care | Attending: Neurology

## 2018-08-31 ENCOUNTER — Inpatient Hospital Stay (HOSPITAL_COMMUNITY)
Admission: EM | Admit: 2018-08-31 | Discharge: 2018-09-14 | DRG: 037 | Disposition: A | Discharge status: Skilled Nursing Facility | Payer: Medicare HMO | Attending: Neurology | Admitting: Neurology

## 2018-08-31 ENCOUNTER — Inpatient Hospital Stay (HOSPITAL_COMMUNITY): Payer: Medicare HMO | Admitting: Anesthesiology

## 2018-08-31 ENCOUNTER — Emergency Department (HOSPITAL_COMMUNITY): Payer: Medicare HMO

## 2018-08-31 DIAGNOSIS — I63231 Cerebral infarction due to unspecified occlusion or stenosis of right carotid arteries: Secondary | ICD-10-CM | POA: Diagnosis not present

## 2018-08-31 DIAGNOSIS — F319 Bipolar disorder, unspecified: Secondary | ICD-10-CM | POA: Diagnosis not present

## 2018-08-31 DIAGNOSIS — R471 Dysarthria and anarthria: Secondary | ICD-10-CM | POA: Diagnosis present

## 2018-08-31 DIAGNOSIS — Z7902 Long term (current) use of antithrombotics/antiplatelets: Secondary | ICD-10-CM | POA: Diagnosis not present

## 2018-08-31 DIAGNOSIS — R29721 NIHSS score 21: Secondary | ICD-10-CM | POA: Diagnosis present

## 2018-08-31 DIAGNOSIS — Z8349 Family history of other endocrine, nutritional and metabolic diseases: Secondary | ICD-10-CM

## 2018-08-31 DIAGNOSIS — Z8249 Family history of ischemic heart disease and other diseases of the circulatory system: Secondary | ICD-10-CM | POA: Diagnosis not present

## 2018-08-31 DIAGNOSIS — G8194 Hemiplegia, unspecified affecting left nondominant side: Secondary | ICD-10-CM | POA: Diagnosis present

## 2018-08-31 DIAGNOSIS — Z7951 Long term (current) use of inhaled steroids: Secondary | ICD-10-CM

## 2018-08-31 DIAGNOSIS — R1310 Dysphagia, unspecified: Secondary | ICD-10-CM | POA: Diagnosis present

## 2018-08-31 DIAGNOSIS — H5347 Heteronymous bilateral field defects: Secondary | ICD-10-CM | POA: Diagnosis present

## 2018-08-31 DIAGNOSIS — I6521 Occlusion and stenosis of right carotid artery: Secondary | ICD-10-CM | POA: Diagnosis present

## 2018-08-31 DIAGNOSIS — J9600 Acute respiratory failure, unspecified whether with hypoxia or hypercapnia: Secondary | ICD-10-CM | POA: Diagnosis present

## 2018-08-31 DIAGNOSIS — R402312 Coma scale, best motor response, none, at arrival to emergency department: Secondary | ICD-10-CM | POA: Diagnosis present

## 2018-08-31 DIAGNOSIS — I63131 Cerebral infarction due to embolism of right carotid artery: Secondary | ICD-10-CM | POA: Diagnosis not present

## 2018-08-31 DIAGNOSIS — C61 Malignant neoplasm of prostate: Secondary | ICD-10-CM | POA: Diagnosis not present

## 2018-08-31 DIAGNOSIS — J9601 Acute respiratory failure with hypoxia: Secondary | ICD-10-CM

## 2018-08-31 DIAGNOSIS — F200 Paranoid schizophrenia: Secondary | ICD-10-CM | POA: Diagnosis present

## 2018-08-31 DIAGNOSIS — I10 Essential (primary) hypertension: Secondary | ICD-10-CM | POA: Diagnosis present

## 2018-08-31 DIAGNOSIS — J9690 Respiratory failure, unspecified, unspecified whether with hypoxia or hypercapnia: Secondary | ICD-10-CM

## 2018-08-31 DIAGNOSIS — E785 Hyperlipidemia, unspecified: Secondary | ICD-10-CM | POA: Diagnosis present

## 2018-08-31 DIAGNOSIS — F0390 Unspecified dementia without behavioral disturbance: Secondary | ICD-10-CM | POA: Diagnosis present

## 2018-08-31 DIAGNOSIS — Z20828 Contact with and (suspected) exposure to other viral communicable diseases: Secondary | ICD-10-CM | POA: Diagnosis present

## 2018-08-31 DIAGNOSIS — R739 Hyperglycemia, unspecified: Secondary | ICD-10-CM | POA: Diagnosis present

## 2018-08-31 DIAGNOSIS — I639 Cerebral infarction, unspecified: Secondary | ICD-10-CM | POA: Diagnosis present

## 2018-08-31 DIAGNOSIS — R402212 Coma scale, best verbal response, none, at arrival to emergency department: Secondary | ICD-10-CM | POA: Diagnosis present

## 2018-08-31 DIAGNOSIS — F29 Unspecified psychosis not due to a substance or known physiological condition: Secondary | ICD-10-CM | POA: Diagnosis not present

## 2018-08-31 DIAGNOSIS — G9340 Encephalopathy, unspecified: Secondary | ICD-10-CM

## 2018-08-31 DIAGNOSIS — Z9109 Other allergy status, other than to drugs and biological substances: Secondary | ICD-10-CM

## 2018-08-31 DIAGNOSIS — L8990 Pressure ulcer of unspecified site, unspecified stage: Secondary | ICD-10-CM | POA: Diagnosis present

## 2018-08-31 DIAGNOSIS — R402112 Coma scale, eyes open, never, at arrival to emergency department: Secondary | ICD-10-CM | POA: Diagnosis present

## 2018-08-31 DIAGNOSIS — R1312 Dysphagia, oropharyngeal phase: Secondary | ICD-10-CM | POA: Diagnosis not present

## 2018-08-31 DIAGNOSIS — Z8673 Personal history of transient ischemic attack (TIA), and cerebral infarction without residual deficits: Secondary | ICD-10-CM

## 2018-08-31 DIAGNOSIS — E669 Obesity, unspecified: Secondary | ICD-10-CM | POA: Diagnosis present

## 2018-08-31 DIAGNOSIS — R29810 Facial weakness: Secondary | ICD-10-CM | POA: Diagnosis present

## 2018-08-31 DIAGNOSIS — I63511 Cerebral infarction due to unspecified occlusion or stenosis of right middle cerebral artery: Principal | ICD-10-CM | POA: Diagnosis present

## 2018-08-31 DIAGNOSIS — Z6833 Body mass index (BMI) 33.0-33.9, adult: Secondary | ICD-10-CM

## 2018-08-31 DIAGNOSIS — L89891 Pressure ulcer of other site, stage 1: Secondary | ICD-10-CM | POA: Diagnosis present

## 2018-08-31 DIAGNOSIS — Z9289 Personal history of other medical treatment: Secondary | ICD-10-CM

## 2018-08-31 DIAGNOSIS — Z841 Family history of disorders of kidney and ureter: Secondary | ICD-10-CM

## 2018-08-31 DIAGNOSIS — Z833 Family history of diabetes mellitus: Secondary | ICD-10-CM | POA: Diagnosis not present

## 2018-08-31 DIAGNOSIS — I6300 Cerebral infarction due to thrombosis of unspecified precerebral artery: Secondary | ICD-10-CM | POA: Diagnosis not present

## 2018-08-31 HISTORY — PX: IR ANGIO INTRA EXTRACRAN SEL COM CAROTID INNOMINATE UNI L MOD SED: IMG5358

## 2018-08-31 HISTORY — PX: RADIOLOGY WITH ANESTHESIA: SHX6223

## 2018-08-31 HISTORY — PX: IR PERCUTANEOUS ART THROMBECTOMY/INFUSION INTRACRANIAL INC DIAG ANGIO: IMG6087

## 2018-08-31 HISTORY — PX: IR CT HEAD LTD: IMG2386

## 2018-08-31 LAB — CBC
HCT: 41.5 % (ref 39.0–52.0)
Hemoglobin: 12.6 g/dL — ABNORMAL LOW (ref 13.0–17.0)
MCH: 28.4 pg (ref 26.0–34.0)
MCHC: 30.4 g/dL (ref 30.0–36.0)
MCV: 93.5 fL (ref 80.0–100.0)
Platelets: 236 10*3/uL (ref 150–400)
RBC: 4.44 MIL/uL (ref 4.22–5.81)
RDW: 14.6 % (ref 11.5–15.5)
WBC: 9.3 10*3/uL (ref 4.0–10.5)
nRBC: 0 % (ref 0.0–0.2)

## 2018-08-31 LAB — BLOOD GAS, ARTERIAL
Acid-base deficit: 4.8 mmol/L — ABNORMAL HIGH (ref 0.0–2.0)
Bicarbonate: 19.7 mmol/L — ABNORMAL LOW (ref 20.0–28.0)
FIO2: 1
MECHVT: 500 mL
O2 Saturation: 99.7 %
PEEP: 16 cmH2O
Patient temperature: 96.6
pCO2 arterial: 33.8 mmHg (ref 32.0–48.0)
pH, Arterial: 7.377 (ref 7.350–7.450)
pO2, Arterial: 374 mmHg — ABNORMAL HIGH (ref 83.0–108.0)

## 2018-08-31 LAB — DIFFERENTIAL
Abs Immature Granulocytes: 0.05 10*3/uL (ref 0.00–0.07)
Basophils Absolute: 0 10*3/uL (ref 0.0–0.1)
Basophils Relative: 0 %
Eosinophils Absolute: 0 10*3/uL (ref 0.0–0.5)
Eosinophils Relative: 0 %
Immature Granulocytes: 1 %
Lymphocytes Relative: 16 %
Lymphs Abs: 1.5 10*3/uL (ref 0.7–4.0)
Monocytes Absolute: 0.8 10*3/uL (ref 0.1–1.0)
Monocytes Relative: 8 %
Neutro Abs: 6.9 10*3/uL (ref 1.7–7.7)
Neutrophils Relative %: 75 %

## 2018-08-31 LAB — I-STAT CHEM 8, ED
BUN: 14 mg/dL (ref 8–23)
Calcium, Ion: 1.13 mmol/L — ABNORMAL LOW (ref 1.15–1.40)
Chloride: 104 mmol/L (ref 98–111)
Creatinine, Ser: 1.1 mg/dL (ref 0.61–1.24)
Glucose, Bld: 132 mg/dL — ABNORMAL HIGH (ref 70–99)
HCT: 40 % (ref 39.0–52.0)
Hemoglobin: 13.6 g/dL (ref 13.0–17.0)
Potassium: 4.8 mmol/L (ref 3.5–5.1)
Sodium: 136 mmol/L (ref 135–145)
TCO2: 27 mmol/L (ref 22–32)

## 2018-08-31 LAB — PROTIME-INR
INR: 1.1 (ref 0.8–1.2)
Prothrombin Time: 13.9 seconds (ref 11.4–15.2)

## 2018-08-31 LAB — CBG MONITORING, ED: Glucose-Capillary: 123 mg/dL — ABNORMAL HIGH (ref 70–99)

## 2018-08-31 LAB — SARS CORONAVIRUS 2 BY RT PCR (HOSPITAL ORDER, PERFORMED IN ~~LOC~~ HOSPITAL LAB): SARS Coronavirus 2: NEGATIVE

## 2018-08-31 LAB — COMPREHENSIVE METABOLIC PANEL
ALT: 17 U/L (ref 0–44)
AST: 23 U/L (ref 15–41)
Albumin: 3.6 g/dL (ref 3.5–5.0)
Alkaline Phosphatase: 120 U/L (ref 38–126)
Anion gap: 10 (ref 5–15)
BUN: 10 mg/dL (ref 8–23)
CO2: 25 mmol/L (ref 22–32)
Calcium: 9.3 mg/dL (ref 8.9–10.3)
Chloride: 103 mmol/L (ref 98–111)
Creatinine, Ser: 1.08 mg/dL (ref 0.61–1.24)
GFR calc Af Amer: >60 mL/min (ref >60)
GFR calc non Af Amer: >60 mL/min (ref >60)
Glucose, Bld: 132 mg/dL — ABNORMAL HIGH (ref 70–99)
Potassium: 4.6 mmol/L (ref 3.5–5.1)
Sodium: 138 mmol/L (ref 135–145)
Total Bilirubin: 0.9 mg/dL (ref 0.3–1.2)
Total Protein: 7.1 g/dL (ref 6.5–8.1)

## 2018-08-31 LAB — GLUCOSE, CAPILLARY
Glucose-Capillary: 94 mg/dL (ref 70–99)
Glucose-Capillary: 105 mg/dL — ABNORMAL HIGH (ref 70–99)

## 2018-08-31 LAB — APTT: aPTT: 28 seconds (ref 24–36)

## 2018-08-31 LAB — MRSA PCR SCREENING: MRSA by PCR: NEGATIVE

## 2018-08-31 SURGERY — IR WITH ANESTHESIA
Anesthesia: General

## 2018-08-31 MED ORDER — SENNOSIDES-DOCUSATE SODIUM 8.6-50 MG PO TABS: 1.0000 | ORAL_TABLET | Freq: Every evening | ORAL | Status: DC | PRN

## 2018-08-31 MED ORDER — SODIUM CHLORIDE 0.9 % IV SOLN
INTRAVENOUS | Status: DC | PRN
  Administered 2018-08-31: 75 ug/min via INTRAVENOUS

## 2018-08-31 MED ORDER — ASPIRIN 81 MG PO CHEW
CHEWABLE_TABLET | ORAL | Status: AC
  Filled 2018-08-31: qty 1

## 2018-08-31 MED ORDER — ACETAMINOPHEN 160 MG/5ML PO SOLN: 650.0000 mg | ORAL | Status: DC | PRN

## 2018-08-31 MED ORDER — NICARDIPINE HCL IN NACL 20-0.86 MG/200ML-% IV SOLN: 0.0000 mg/h | INTRAVENOUS | Status: DC

## 2018-08-31 MED ORDER — IOHEXOL 350 MG/ML SOLN
75.0000 mL | Freq: Once | INTRAVENOUS | Status: AC | PRN
  Administered 2018-08-31: 75 mL via INTRAVENOUS

## 2018-08-31 MED ORDER — SUCCINYLCHOLINE CHLORIDE 200 MG/10ML IV SOSY
PREFILLED_SYRINGE | INTRAVENOUS | Status: DC | PRN
  Administered 2018-08-31: 160 mg via INTRAVENOUS

## 2018-08-31 MED ORDER — IOHEXOL 300 MG/ML  SOLN: 150.0000 mL | Freq: Once | INTRAMUSCULAR | Status: DC | PRN

## 2018-08-31 MED ORDER — PROPOFOL 1000 MG/100ML IV EMUL
0.0000 ug/kg/min | INTRAVENOUS | Status: DC
  Administered 2018-08-31: 40 ug/kg/min via INTRAVENOUS
  Administered 2018-09-01 (×3): 50 ug/kg/min via INTRAVENOUS
  Administered 2018-09-01: 30 ug/kg/min via INTRAVENOUS
  Administered 2018-09-01: 50 ug/kg/min via INTRAVENOUS
  Administered 2018-09-02 (×2): 25 ug/kg/min via INTRAVENOUS
  Filled 2018-08-31 (×9): qty 100

## 2018-08-31 MED ORDER — SODIUM CHLORIDE 0.9 % IV SOLN
50.0000 mL | Freq: Once | INTRAVENOUS | Status: AC
  Administered 2018-08-31: 50 mL via INTRAVENOUS

## 2018-08-31 MED ORDER — ROCURONIUM BROMIDE 50 MG/5ML IV SOSY
PREFILLED_SYRINGE | INTRAVENOUS | Status: DC | PRN
  Administered 2018-08-31 (×3): 50 mg via INTRAVENOUS

## 2018-08-31 MED ORDER — CLOPIDOGREL BISULFATE 300 MG PO TABS
ORAL_TABLET | ORAL | Status: AC
  Filled 2018-08-31: qty 1

## 2018-08-31 MED ORDER — CLEVIDIPINE BUTYRATE 0.5 MG/ML IV EMUL
INTRAVENOUS | Status: AC
  Filled 2018-08-31: qty 50

## 2018-08-31 MED ORDER — CEFAZOLIN SODIUM-DEXTROSE 2-3 GM-%(50ML) IV SOLR
INTRAVENOUS | Status: DC | PRN
  Administered 2018-08-31: 2 g via INTRAVENOUS

## 2018-08-31 MED ORDER — EPTIFIBATIDE 20 MG/10ML IV SOLN
INTRAVENOUS | Status: AC | PRN
  Administered 2018-08-31 (×6): 1.5 mg

## 2018-08-31 MED ORDER — SODIUM CHLORIDE 0.9 % IV SOLN
INTRAVENOUS | Status: DC
  Administered 2018-08-31 – 2018-09-03 (×3): via INTRAVENOUS

## 2018-08-31 MED ORDER — CEFAZOLIN SODIUM-DEXTROSE 2-4 GM/100ML-% IV SOLN
INTRAVENOUS | Status: AC
  Filled 2018-08-31: qty 100

## 2018-08-31 MED ORDER — FENTANYL CITRATE (PF) 100 MCG/2ML IJ SOLN: 25.0000 ug | INTRAMUSCULAR | Status: DC | PRN

## 2018-08-31 MED ORDER — CHLORHEXIDINE GLUCONATE 0.12% ORAL RINSE (MEDLINE KIT)
15.0000 mL | Freq: Two times a day (BID) | OROMUCOSAL | Status: DC
  Administered 2018-08-31 – 2018-09-02 (×4): 15 mL via OROMUCOSAL

## 2018-08-31 MED ORDER — TIROFIBAN HCL IN NACL 5-0.9 MG/100ML-% IV SOLN
INTRAVENOUS | Status: AC
  Filled 2018-08-31: qty 100

## 2018-08-31 MED ORDER — ASPIRIN 81 MG PO CHEW
CHEWABLE_TABLET | ORAL | Status: AC | PRN
  Administered 2018-08-31: 81 mg via NASOGASTRIC

## 2018-08-31 MED ORDER — TICAGRELOR 60 MG PO TABS
ORAL_TABLET | ORAL | Status: AC | PRN
  Administered 2018-08-31: 180 mg via NASOGASTRIC

## 2018-08-31 MED ORDER — ASPIRIN 325 MG PO TABS
325.0000 mg | ORAL_TABLET | Freq: Every day | ORAL | Status: DC
  Administered 2018-09-03 – 2018-09-11 (×9): 325 mg via ORAL
  Filled 2018-08-31 (×9): qty 1

## 2018-08-31 MED ORDER — PHENYLEPHRINE 40 MCG/ML (10ML) SYRINGE FOR IV PUSH (FOR BLOOD PRESSURE SUPPORT)
PREFILLED_SYRINGE | INTRAVENOUS | Status: DC | PRN
  Administered 2018-08-31 (×2): 40 ug via INTRAVENOUS
  Administered 2018-08-31 (×2): 80 ug via INTRAVENOUS

## 2018-08-31 MED ORDER — NITROGLYCERIN 1 MG/10 ML FOR IR/CATH LAB
INTRA_ARTERIAL | Status: AC
  Filled 2018-08-31: qty 10

## 2018-08-31 MED ORDER — CLEVIDIPINE BUTYRATE 0.5 MG/ML IV EMUL
0.0000 mg/h | INTRAVENOUS | Status: AC
  Administered 2018-08-31: 2 mg/h via INTRAVENOUS
  Administered 2018-08-31 – 2018-09-01 (×2): 21 mg/h via INTRAVENOUS
  Administered 2018-09-01: 18 mg/h via INTRAVENOUS
  Filled 2018-08-31 (×6): qty 50

## 2018-08-31 MED ORDER — ACETAMINOPHEN 650 MG RE SUPP: 650.0000 mg | RECTAL | Status: DC | PRN

## 2018-08-31 MED ORDER — EPTIFIBATIDE 20 MG/10ML IV SOLN
INTRAVENOUS | Status: AC
  Filled 2018-08-31: qty 10

## 2018-08-31 MED ORDER — LISINOPRIL 20 MG PO TABS: 20.0000 mg | ORAL_TABLET | Freq: Every day | ORAL | Status: DC

## 2018-08-31 MED ORDER — ATORVASTATIN CALCIUM 80 MG PO TABS
80.0000 mg | ORAL_TABLET | Freq: Every day | ORAL | Status: DC
  Administered 2018-08-31: 80 mg via ORAL
  Filled 2018-08-31 (×2): qty 1

## 2018-08-31 MED ORDER — ACETAMINOPHEN 325 MG PO TABS
650.0000 mg | ORAL_TABLET | ORAL | Status: DC | PRN
  Filled 2018-08-31 (×2): qty 2

## 2018-08-31 MED ORDER — ACETAMINOPHEN 325 MG PO TABS: 650.0000 mg | ORAL_TABLET | ORAL | Status: DC | PRN

## 2018-08-31 MED ORDER — PANTOPRAZOLE SODIUM 40 MG IV SOLR
40.0000 mg | Freq: Every day | INTRAVENOUS | Status: DC
  Administered 2018-08-31 – 2018-09-04 (×5): 40 mg via INTRAVENOUS
  Filled 2018-08-31 (×5): qty 40

## 2018-08-31 MED ORDER — FENTANYL CITRATE (PF) 100 MCG/2ML IJ SOLN
25.0000 ug | INTRAMUSCULAR | Status: DC | PRN
  Administered 2018-08-31 – 2018-09-02 (×4): 100 ug via INTRAVENOUS
  Filled 2018-08-31 (×7): qty 2

## 2018-08-31 MED ORDER — TICAGRELOR 90 MG PO TABS
ORAL_TABLET | ORAL | Status: AC
  Filled 2018-08-31: qty 2

## 2018-08-31 MED ORDER — CLOPIDOGREL BISULFATE 75 MG PO TABS
75.0000 mg | ORAL_TABLET | Freq: Every day | ORAL | Status: DC
  Administered 2018-09-03 – 2018-09-14 (×12): 75 mg via ORAL
  Filled 2018-08-31 (×12): qty 1

## 2018-08-31 MED ORDER — SODIUM CHLORIDE 0.9 % IV SOLN
INTRAVENOUS | Status: DC
  Administered 2018-08-31 – 2018-09-01 (×2): via INTRAVENOUS

## 2018-08-31 MED ORDER — ALBUMIN HUMAN 5 % IV SOLN
INTRAVENOUS | Status: DC | PRN
  Administered 2018-08-31: 15:00:00 via INTRAVENOUS

## 2018-08-31 MED ORDER — LIDOCAINE HCL 1 % IJ SOLN
INTRAMUSCULAR | Status: AC
  Filled 2018-08-31: qty 20

## 2018-08-31 MED ORDER — INSULIN ASPART 100 UNIT/ML ~~LOC~~ SOLN
1.0000 [IU] | SUBCUTANEOUS | Status: DC
  Administered 2018-09-02: 1 [IU] via SUBCUTANEOUS
  Administered 2018-09-03 (×2): 2 [IU] via SUBCUTANEOUS
  Administered 2018-09-03 – 2018-09-10 (×11): 1 [IU] via SUBCUTANEOUS

## 2018-08-31 MED ORDER — PROPOFOL 10 MG/ML IV BOLUS
INTRAVENOUS | Status: DC | PRN
  Administered 2018-08-31: 150 mg via INTRAVENOUS

## 2018-08-31 MED ORDER — ALTEPLASE (STROKE) FULL DOSE INFUSION
0.9000 mg/kg | Freq: Once | INTRAVENOUS | Status: AC
  Administered 2018-08-31: 87.7 mg via INTRAVENOUS
  Filled 2018-08-31: qty 100

## 2018-08-31 MED ORDER — ASPIRIN 325 MG PO TABS
ORAL_TABLET | ORAL | Status: AC
  Filled 2018-08-31: qty 1

## 2018-08-31 MED ORDER — STROKE: EARLY STAGES OF RECOVERY BOOK
Freq: Once | Status: AC
  Administered 2018-08-31: 19:00:00
  Filled 2018-08-31: qty 1

## 2018-08-31 MED ORDER — SODIUM CHLORIDE 0.9 % IV SOLN
INTRAVENOUS | Status: DC | PRN
  Administered 2018-08-31: 15:00:00 via INTRAVENOUS

## 2018-08-31 MED ORDER — ASPIRIN 81 MG PO CHEW
324.0000 mg | CHEWABLE_TABLET | Freq: Every day | ORAL | Status: DC
  Administered 2018-09-01 – 2018-09-02 (×2): 324 mg
  Filled 2018-08-31 (×3): qty 4

## 2018-08-31 MED ORDER — ORAL CARE MOUTH RINSE
15.0000 mL | OROMUCOSAL | Status: DC
  Administered 2018-08-31 – 2018-09-02 (×16): 15 mL via OROMUCOSAL

## 2018-08-31 MED ORDER — SODIUM CHLORIDE (PF) 0.9 % IJ SOLN
INTRAVENOUS | Status: AC | PRN
  Administered 2018-08-31 (×3): 25 ug via INTRA_ARTERIAL

## 2018-08-31 MED ORDER — LIDOCAINE 2% (20 MG/ML) 5 ML SYRINGE
INTRAMUSCULAR | Status: DC | PRN
  Administered 2018-08-31: 60 mg via INTRAVENOUS

## 2018-08-31 MED ORDER — CLOPIDOGREL BISULFATE 75 MG PO TABS
75.0000 mg | ORAL_TABLET | Freq: Every day | ORAL | Status: DC
  Administered 2018-09-01 – 2018-09-02 (×2): 75 mg
  Filled 2018-08-31 (×5): qty 1

## 2018-08-31 NOTE — Anesthesia Procedure Notes (Signed)
Procedure Name: Intubation Date/Time: 08/31/2018 2:35 PM Performed by: Renato Shin, CRNA Pre-anesthesia Checklist: Patient identified, Emergency Drugs available, Suction available and Patient being monitored Patient Re-evaluated:Patient Re-evaluated prior to induction Oxygen Delivery Method: Circle system utilized Preoxygenation: Pre-oxygenation with 100% oxygen Induction Type: IV induction and Rapid sequence Laryngoscope Size: Glidescope and 4 Grade View: Grade I Tube type: Oral Tube size: 8.0 mm Number of attempts: 1 Airway Equipment and Method: Stylet and Oral airway Placement Confirmation: ETT inserted through vocal cords under direct vision,  positive ETCO2 and breath sounds checked- equal and bilateral Secured at: 21 cm Tube secured with: Tape Dental Injury: Teeth and Oropharynx as per pre-operative assessment  Difficulty Due To: Difficulty was anticipated

## 2018-08-31 NOTE — Sedation Documentation (Signed)
Pt under the care of Anesthesia 

## 2018-08-31 NOTE — H&P (Signed)
Neurology H&P  Reason for Consult: Code stroke Referring Physician: [facility] department MD  CC: Code stroke  History is obtained from: EMS  HPI: Ronald [name] is a 73 y.o. male with history of stroke, schizophrenia, measles, hypertension, hyperlipidemia, depression, dementia.  Patient was found at his home sitting in a chair when he suddenly slumped over to the left and was noted to be flaccid on the left with right gaze deviation along with dysarthric.  EMS was called and patient was immediately brought as a code stroke to [facility].  Patient is unable to give history at this time.   ED course patient was immediately brought to the CT scanner for both CT of head along with CTA of head and neck.     Chart review:  Patient was recently seen in the hospital on 04/2018. 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  CT head and neck showed right ICA occluded at origin, progressed from 2 years ago which only showed occlusion at ICA siphon.  Unchanged right VA occlusion.  EEG normal  LDL 74  HgbA1c 5.1  Right ICA occlusion  In 06/2016 CTA showed right ICA occlusion at ophthalmic level with distal reconstitution  This admission CTA showed right ICA occlusion at bifurcation, progressed from 06/2016  BP goal 130-160  Avoid low BP  History of stroke  06/2016 admitted for a fall and difficulty walking.  Also had a cough and fever due to left lower lobe PNA.  MRI showed right MCA/ACA, MCA/PCA infarcts.  CTA head neck showed right ICA occlusion at the ophthalmic level with distal reconstitution, right P1 stenosis and right VA occlusion.  IR also confirmed right ICA occlusion, no intervention offered.  LDL 86 and A1c 5.4, patient discharged with DAPT for 3 months and Lipitor 80.  Hyperlipidemia  Home meds: Lipitor 80  LDL 74,     LKW:  12:00 on 08/31/2018 tpa given?: yes Premorbid modified Rankin scale (mRS): 2 NIH stroke scale 21 ROS: Unable to obtain due to altered mental status.   Past Medical History:  Diagnosis Date  . Cancer [facility])    Prostate  . Chicken pox   . Dementia ([place])   . Depression   . Heart disease   . Hyperlipemia   . Hypertension   . Measles   . Mumps   . 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 Stroke [facility])      Family History  Problem Relation Age of Onset  . Hypertension Father   . Hyperlipidemia Father   . Congestive Heart Failure Father 25       Deceased-1995  . Diabetes Mother   . Kidney failure Mother 48       Deceased-2005  . Hypertension Mother   . Congestive Heart Failure Maternal Grandmother   . Heart failure Paternal Grandmother   . Heart attack Paternal Grandmother    Social History:   reports that he has never smoked. He has never used smokeless tobacco. He reports that he does not drink alcohol or use drugs.  Medications  Current Facility-Administered Medications:  .  alteplase (ACTIVASE) 1 mg/mL infusion 87.8 mg, 0.9 mg/kg, Intravenous, Once **FOLLOWED BY** 0.9 %  sodium chloride infusion, 50 mL, Intravenous, Once, Tedric Leeth, Karena Addison R, MD  Current Outpatient Medications:  .  atorvastatin (LIPITOR) 80 MG tablet, Take 80 mg  by mouth daily., Disp: , Rfl:  .  Calcium-Vitamin D 600-200 MG-UNIT per tablet, Take 1 tablet by mouth daily., Disp: , Rfl:  .  clopidogrel (PLAVIX) 75 MG tablet, Take 1 tablet (75 mg total) by mouth daily., Disp: 30 tablet, Rfl: 0 .  finasteride (PROSCAR) 5 MG tablet, Take 1 tablet (5 mg total) by mouth daily., Disp: 30 tablet, Rfl: 0 .  fluticasone (FLONASE) 50 MCG/ACT nasal spray, Place 1 spray into both nostrils daily., Disp: , Rfl:  .  lisinopril (PRINIVIL,ZESTRIL) 20 MG tablet, Take 1 tablet (20 mg total) by mouth daily., Disp: 60 tablet, Rfl: 2  Exam: Current vital signs: Wt 97.5 kg   BMI 33.67 kg/m  Vital signs in last  24 hours: Weight:  [97.5 kg] 97.5 kg (06/04 1200)  Physical Exam  Constitutional: Appears well-developed and well-nourished.  Psych: Affect appropriate to situation Eyes: No scleral injection HENT: No OP obstrucion Head: Normocephalic.  Cardiovascular: Normal rate and regular rhythm.  Respiratory: Effort normal, non-labored breathing GI: Soft.  No distension. There is no tenderness.  Skin: WDI  Neuro: Mental Status: Patient is awake, alert,oriented to birthday, age and name Neglecting left side and severe dysarthria Cranial Nerves: II: left hemianopsia III,IV, VI: forced right gaze, Pupils equal, round and reactive to light V: decreased on the left VII: left facial droop  VIII: hearing is intact to voice X: Palat elevates symmetrically XI: Shoulder shrug is asymmetric on the left XII: tongue is midline without atrophy or fasciculations.  Motor: Right arm and leg 5/5 leg leg 3/5 and left arm 0/5 Sensory: Neglecting left side Deep Tendon Reflexes: 2+ and symmetric in the biceps and patellae.  Plantars: Toes are downgoing bilaterally.  Cerebellar: Right FNF intact  Labs I have reviewed labs in epic and the results pertinent to this consultation are:   CBC    Component Value Date/Time   WBC 9.3 08/31/2018 1259   RBC 4.44 08/31/2018 1259   HGB 13.6 08/31/2018 1306   HCT 40.0 08/31/2018 1306   PLT 236 08/31/2018 1259   MCV 93.5 08/31/2018 1259   MCH 28.4 08/31/2018 1259   MCHC 30.4 08/31/2018 1259   RDW 14.6 08/31/2018 1259   LYMPHSABS 1.5 08/31/2018 1259   MONOABS 0.8 08/31/2018 1259   EOSABS 0.0 08/31/2018 1259   BASOSABS 0.0 08/31/2018 1259    CMP     Component Value Date/Time   NA 136 08/31/2018 1306   K 4.8 08/31/2018 1306   CL 104 08/31/2018 1306   CO2 26 05/25/2018 0546   GLUCOSE 132 (H) 08/31/2018 1306   BUN 14 08/31/2018 1306   CREATININE 1.10 08/31/2018 1306   CREATININE 0.99 10/15/2013 0755   CALCIUM 8.9 05/25/2018 0546   PROT 6.0 (L)  05/23/2018 1840   ALBUMIN 3.1 (L) 05/24/2018 0008   AST 42 (H) 05/23/2018 1840   ALT 26 05/23/2018 1840   ALKPHOS 86 05/23/2018 1840   BILITOT 1.7 (H) 05/23/2018 1840   GFRNONAA >60 05/25/2018 0546   GFRAA >60 05/25/2018 0546    Lipid Panel     Component Value Date/Time   CHOL 122 05/24/2018 0008   TRIG 72 05/24/2018 0008   HDL 34 (L) 05/24/2018 0008   CHOLHDL 3.6 05/24/2018 0008   VLDL 14 05/24/2018 0008   LDLCALC 74 05/24/2018 0008     Imaging I have reviewed the images obtained:  CT-scan of the brain--Stable non contrast CT appearance of the brain since February. Chronic right parietal lobe encephalomalacia. ASPECTS  is 10.  CTA head and neck--Improved cervical right ICA and right siphon since the CT in February, however there is persistent occlusion of the right ICA at the ophthalmic artery origin.There is new stenosis of the Right ACA A2, at least moderate.  Perfusion--335 mL in February, but visually the right hemisphere distribution of prolonged T-max is unchanged.  Etta Quill PA-C [facility] [phone]  M-F  (9:00 am- 5:00 PM)  08/31/2018, 1:13 PM     Assessment: 73 yo with history of stroke with left sided weakness, chronic right ICA stenosis/occlusion presenting with worsening of symptoms including left sided weakness, dysarthria and right gaze.  Patient had a head CT which is negative for hemorrhage and was administer IV TPA after discussion with family and patient.    CT angiogram was performed which shows improvement of chronic right ICA occlusion from scan done in February 2020.  CT perfusion was then performed which showed a large area of penumbra.  Case was discussed with Dr. Patrecia Pour as well as Dr. Cornelius Moras, patient's vascular neurologist during last admission and we decided to pursue cerebral angiogram with intention to perform angioplasty/stenting if needed.  Was taken to IR for further intervention.  Right MCA stroke status post TPA and  angioplasty of right ICA  # MRI of the brain without contrast # No need to repeat Echo performed on 04/2018  #Aspirin and Plavix within TPA window as patient underwent angioplasty # BP goal: 509 -326 systolic  # Telemetry monitoring # Frequent neuro checks  Intubated for airway protection -Intubated for procedure -Appreciate PCCM assistance  Hyperglycemia -Management per PCCM -Sliding scale insulin  Hypertension -  BP goal 712-458 systolic, on Cleviprex   Full code  DVT Prophylaxis: SCD  Please page stroke NP  Or  PA  Or MD from 8am -4 pm  as this patient from this time will be  followed by the stroke.   You can look them up on [email or web address]  Password [email or web address]   NEUROHOSPITALIST ADDENDUM Performed a face to face diagnostic evaluation.   I have reviewed the contents of history and physical exam as documented by PA/ARNP/Resident and agree with above documentation.  I have discussed and formulated the above plan as documented. Edits to the note have been made as needed.  73 year old male with past medical history of right MCA stroke mild residual left hemiparesis presents as code stroke.  I examined the patient at time of arrival, has  left-sided plegia, neglect, left homonymous hemianopsia and rightward gaze.  NIH stroke scale 21.   He received IV TPA.  CT angiogram showed improvement of chornic right ICA occlusion, suspect stroke due to tandem occlusion/stenosis in the setting of chronic ICA stenosis.  Taken to IR and status post angioplasty.   This patient is neurologically critically ill due to R hemispheric stroke s/p IVTPA and angioplasty of ICA. He is at risk for significant risk of neurological worsening from cerebral edema,  death from brain herniation, heart failure, hemorrhagic conversion, infection, respiratory failure and seizure. This patient's care requires constant monitoring of vital signs, hemodynamics, respiratory and cardiac monitoring, review of multiple  databases, neurological assessment, discussion with family, other specialists and medical decision making of high complexity.  I spent 90 minutes of neurocritical time in the care of this patient including being present at time of stroke alert, administering IV TPA, reviewing perfusion and discussing with interventionalists and patient's vascular neurologist.  Time spent independent from [place] who assisted in note.  Karena Addison Marleen Moret MD [facility] [phone]   If 7pm to 7am, please call on call as listed on AMION.

## 2018-08-31 NOTE — Sedation Documentation (Signed)
Dressing and pulses checked with bedside nurse.

## 2018-08-31 NOTE — Progress Notes (Signed)
Patient ID: Ronald [name], male   DOB: 06-06-1945, 73 y.o.   MRN: [id number] INR. 73 Y RH M MRSS 4m   LSW 12noon. New onset of RT gaze deviation ,lt hemiplegia. NIHSS 21. CT brain No ICHCTA occluded RT ICA supraclinoid seg  CTP RT cerebral Tmax> 6 s 32ml with no core. Endovascular treatment option D/W sister.Reasons,risks and alternatives were reviwed. Risks of ICH of 10 %,worsening neuro deficit,death ,inability to revascularize,vascular injury were discussed.Sister expressed understanding and provided consent to proceed with reatment . S.Jatziri Goffredo MD MD

## 2018-08-31 NOTE — Anesthesia Preprocedure Evaluation (Addendum)
Anesthesia Evaluation   Patient confused    Reviewed: Allergy & Precautions, Patient's Chart, lab work & pertinent test resultsPreop documentation limited or incomplete due to emergent nature of procedure.  History of Anesthesia Complications Negative for: history of anesthetic complications  Airway       Comment:  Unable to perform due to patient's current mental status  Dental  (+) Poor Dentition, Missing   Pulmonary    breath sounds clear to auscultation       Cardiovascular hypertension, Pt. on medications  Rhythm:Regular Rate:Normal     Neuro/Psych PSYCHIATRIC DISORDERS Depression Bipolar Disorder Schizophrenia Dementia TIACVA, Residual Symptoms    GI/Hepatic GERD  ,  Endo/Other    Renal/GU      Musculoskeletal   Abdominal   Peds  Hematology   Anesthesia Other Findings   Reproductive/Obstetrics                            Anesthesia Physical Anesthesia Plan  ASA: III and emergent  Anesthesia Plan: General   Post-op Pain Management:    Induction: Intravenous  PONV Risk Score and Plan: 2 and Treatment may vary due to age or medical condition and Ondansetron  Airway Management Planned: Oral ETT  Additional Equipment: Arterial line  Intra-op Plan:   Post-operative Plan: Post-operative intubation/ventilation  Informed Consent:     Only emergency history available and History available from chart only  Plan Discussed with: CRNA and Anesthesiologist  Anesthesia Plan Comments:         Anesthesia Quick Evaluation

## 2018-08-31 NOTE — Code Documentation (Signed)
Lab called back at 1430 and reported patient was negative for COVID. IR approved patient to come over to the IR suite for patient to be intubated by Anesthesia. Pt taken to IR and report given to CRNA and Elmyra Ricks, [place] RN.

## 2018-08-31 NOTE — Consult Note (Addendum)
NAME:  Ronald [name], MRN:  [id number], DOB:  [birth year], LOS: 0 ADMISSION DATE:  08/31/2018, CONSULTATION DATE:  6/4 REFERRING MD:  Aroor, CHIEF COMPLAINT:  CVA  Brief History   73 year old male was admitted for CVA.  Received TPA systemically in the ED and was taken to IR.  Post IR remained on the ventilator was transferred to the ICU.  History of present illness   Patient is encephalopathic and/or intubated. Therefore history has been obtained from chart review.  73 year old male with past medical history as below, which is significant for stroke, schizophrenia, measles, hypertension, hyperlipidemia, and dementia.  The patient was in his usual state of health until 6/4 when suddenly slumping over noted to be flaccid on the left side with a right gaze deviation.  Speech was also altered.  EMS was called by family and patient was transported to [facility] emergency department as a code stroke.  He was evaluated by neurology in the emergency department.  Initial CT head without hemorrhage and systemic TPA was administered.  He was taken emergently to IR where he was intubated and underwent balloon angioplasty of occluded right ICA.  Post procedurally he was transported to the ICU on the ventilator.  PCCM was consulted.  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  Significant Hospital Events   6/4 CVA< admit, TPA, IR. To ICU on vent.   Consults:  IR PCCM  Procedures:  6/4 balloon angioplasty of R ICA via neuro IR  Significant Diagnostic Tests:  CTH 6/4 > Stable non contrast CT appearance of the brain since February. Chronic right parietal lobe encephalomalacia. 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 Stable right ICA terminus and right MCA branches. However, there is new stenosis of the Right ACA A2, at least moderate.  Micro Data:  covid > neg  Antimicrobials:   cefazolin peri-op  Interim history/subjective:    Objective   Blood pressure (!) 142/70, pulse (!) 59, temperature (!) 96.6 F (35.9 C), temperature source Axillary, resp. rate 16, weight 97.5 kg, SpO2 100 %.        Intake/Output Summary (Last 24 hours) at 08/31/2018 1759 Last data filed at 08/31/2018 1743 Gross per 24 hour  Intake 1350 ml  Output 20 ml  Net 1330 ml   Filed Weights   08/31/18 1200 08/31/18 1332  Weight: 97.5 kg 97.5 kg    Examination: General: elderly male on vent HENT: [place]/AT, PERRL, no JVD Lungs: Clear bilateral breath sounds Cardiovascular: RRR, no MRG Abdomen: Soft, non-distended Extremities: no acute deformity Neuro: [place]/AT, paralyzed after neuromuscular blockade administration.  GU: Foley  Resolved Hospital Problem list     Assessment & Plan:   Acute CVA: s/p TPA and IR - Per stoke service - SBP goal 120-140 - Cleviprex - plavix  Respiratory insufficiency  - Full vent support - SBT in AM - ABG pending - VAP bundle  Hyperglycemia with no DM history - SSI  Hypertension - holding home lisinopril  Dementia Schizophrenia - uncertain baseline  Best practice:  Diet: NPO Pain/Anxiety/Delirium protocol (if indicated): Propofol to keep RASS 0 to -1. PRN fentanyl.  VAP protocol (if indicated): per protocol DVT prophylaxis: SCD GI prophylaxis: Protonix Glucose control: SSI Mobility:  BR Code Status: FULL Family Communication:  Disposition: ICU  Labs   CBC: Recent Labs  Lab 08/31/18 1259 08/31/18 1306  WBC 9.3  --   NEUTROABS 6.9  --   HGB 12.6* 13.6  HCT 41.5 40.0  MCV 93.5  --   PLT 236  --     Basic Metabolic Panel: Recent Labs  Lab 08/31/18 1259 08/31/18 1306  NA 138 136  K 4.6 4.8  CL 103 104  CO2 25  --   GLUCOSE  132* 132*  BUN 10 14  CREATININE 1.08 1.10  CALCIUM 9.3  --    GFR: Estimated Creatinine Clearance: 67.6 mL/min (by C-G formula based on SCr of 1.1 mg/dL). Recent Labs  Lab 08/31/18 1259  WBC 9.3    Liver Function Tests: Recent Labs  Lab 08/31/18 1259  AST 23  ALT 17  ALKPHOS 120  BILITOT 0.9  PROT 7.1  ALBUMIN 3.6   No results for input(s): LIPASE, AMYLASE in the last 168 hours. No results for input(s): AMMONIA in the last 168 hours.  ABG    Component Value Date/Time   TCO2 27 08/31/2018 1306     Coagulation Profile: Recent Labs  Lab 08/31/18 1259  INR 1.1    Cardiac Enzymes: No results for input(s): CKTOTAL, CKMB, CKMBINDEX, TROPONINI in the last 168 hours.  HbA1C: Hgb A1c MFr Bld  Date/Time Value Ref Range Status  05/24/2018 12:08 AM 5.1 4.8 - 5.6 % Final    Comment:    (NOTE) Pre diabetes:          5.7%-6.4% Diabetes:              >6.4% Glycemic control for   <7.0% adults with diabetes   07/16/2016 10:17 AM 5.4 4.8 - 5.6 % Final    Comment:    (NOTE)         Pre-diabetes: 5.7 - 6.4         Diabetes: >6.4         Glycemic control for adults with diabetes: <7.0     CBG: Recent Labs  Lab 08/31/18 1258  GLUCAP 123*    Review of Systems:   Unable as he is encephalopathic and intubated.   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   Surgical History    Past Surgical History:  Procedure Laterality Date  . IR ANGIO EXTERNAL CAROTID SEL EXT CAROTID UNI R MOD SED  07/20/2016  . IR ANGIO INTRA EXTRACRAN SEL INTERNAL CAROTID BILAT MOD SED  07/20/2016  . IR ANGIO VERTEBRAL SEL SUBCLAVIAN INNOMINATE UNI L MOD SED  07/20/2016  . IR ANGIOGRAM EXTREMITY RIGHT  07/20/2016     Social History   reports that he has never smoked. He has never used smokeless tobacco. He reports  that he does not drink alcohol or use drugs.   Family History   His family history includes Congestive Heart Failure in his maternal grandmother; Congestive Heart Failure (age of onset: 40) in his father; Diabetes in his mother; Heart attack in his paternal grandmother; Heart failure in his paternal grandmother; Hyperlipidemia in his father; Hypertension in his father and mother; Kidney failure (age of onset: 2) in his mother.   Allergies Allergies  Allergen Reactions  . Dust Mite Extract Other (See Comments)    Allergy symptoms   . Pollen Extract Other (  See Comments)    Allergy symptoms   . Rye Grass Flower Pollen Extract [Gramineae Pollens] Other (See Comments)    Allergy Symptoms      Home Medications  Prior to Admission medications   Medication Sig Start Date End Date Taking? Authorizing Provider  atorvastatin (LIPITOR) 80 MG tablet Take 80 mg by mouth daily. 01/23/18   [provider]  Calcium-Vitamin D 600-200 MG-UNIT per tablet Take 1 tablet by mouth daily.    [provider]  clopidogrel (PLAVIX) 75 MG tablet Take 1 tablet (75 mg total) by mouth daily. 07/22/16   Jani Gravel, MD  finasteride (PROSCAR) 5 MG tablet Take 1 tablet (5 mg total) by mouth daily. 07/22/16   Jani Gravel, MD  fluticasone (FLONASE) 50 MCG/ACT nasal spray Place 1 spray into both nostrils daily.    [provider]  lisinopril (PRINIVIL,ZESTRIL) 20 MG tablet Take 1 tablet (20 mg total) by mouth daily. 05/26/18   Donzetta Starch, NP     Critical care time: [address] AGACNP-BC [place] Pager [phone] or [phone]  08/31/2018 6:18 PM  Attending Note:  73 year old male with PMH above presenting to [facility] via EMS with the chief complaint with facial droop, dysarthria and left sided weakness.  Brother called when patient was noted to be slumped over.  Head CT was negative and tPA was given but subsequently was taken to IR for angioplasty.   Patient was intubated for the procedure and PCCM was consulted for vent management.  On exam, patient is paralyzed, lungs are clear, heart is normal with soft abdomen.  I reviewed head CT myself, no evidence of bleeding.  Will admit to the ICU.  Check ABG and CXR now.  Adjust vent for ABG.  Propofol drip and PRN fentanyl.  Cleviprex for SBP of 120-140 mmHg.  ISS/CBGs.  WUA in AM.  SBT in AM.  PCCM will continue to follow.  The patient is critically ill with multiple organ systems failure and requires high complexity decision making for assessment and support, frequent evaluation and titration of therapies, application of advanced monitoring technologies and extensive interpretation of multiple databases.   Critical Care Time devoted to patient care services described in this note is  45  Minutes. This time reflects time of care of this signee Dr Jennet Maduro. This critical care time does not reflect procedure time, or teaching time or supervisory time of PA/NP/Med student/Med Resident etc but could involve care discussion time.  Rush Farmer, M.D. [facility] Pulmonary/Critical Care Medicine. Pager: [phone]. After hours pager: [phone].

## 2018-08-31 NOTE — Sedation Documentation (Signed)
Report given to Patty D. RN.  Patty RN to resume Stroke IR case for this RN

## 2018-08-31 NOTE — Anesthesia Postprocedure Evaluation (Signed)
Anesthesia Post Note  Patient: Ronald [name]  Procedure(s) Performed: IR WITH ANESTHESIA (N/A )     Patient location during evaluation: NICU Anesthesia Type: General Level of consciousness: sedated and patient remains intubated per anesthesia plan Pain management: pain level controlled Vital Signs Assessment: post-procedure vital signs reviewed and stable Respiratory status: patient remains intubated per anesthesia plan, patient on ventilator - see flowsheet for VS and respiratory function stable Cardiovascular status: stable Anesthetic complications: no    Last Vitals:  Vitals:   08/31/18 1745 08/31/18 1800  BP:  110/60  Pulse: (!) 59 (!) 58  Resp: 16 16  Temp: (!) 35.9 C   SpO2: 100% 100%    Last Pain:  Vitals:   08/31/18 1745  TempSrc: Axillary    LLE Motor Response: No movement to painful stimulus (08/31/18 1800)   RLE Motor Response: No movement to painful stimulus (08/31/18 1800)        Darius Lundberg COKER

## 2018-08-31 NOTE — Progress Notes (Signed)
Patient ID: Ronald [name], male   DOB: 12/23/1945, 73 y.o.   MRN: [id number] INR. Post procedure. CT brain NO ICH  Mass effect or shift. RT groin sheath to arterial flush. Distal pulses dopplerable DPs and PTs bilaterally. S.Lakia Gritton MD

## 2018-08-31 NOTE — Code Documentation (Signed)
Upon further review by MD Aroor and Radiologist, CTA shows some recanalization to chronic occluded ICA. MD requesting CTP perfusion to evaluate potential candidate for IR. CT called and patient being taken back over for CTP.

## 2018-08-31 NOTE — Code Documentation (Signed)
73 yo male coming from home with complaints of new onset of facial droop, dysarthria, and worsening left sided weakness. Pt lives with brother and is reported to be last known normal at 53 when he slumped over and family called EMS. EMS activated a Code Stroke. Stroke Team met patient upon arrival to the ED. Initial NIHSS 21 due to right gaze, left hemianopia, left facial droop, left arm and leg weakness, along with visual and sensory neglect. Phlebotomy drew labs upon arrival and IR notified of potential candidate at 1306. Pt taken to CT and CT negative for hemorrhage. CTA completed. Pt has hx of known chronic ICA occlusion, but no Large Vessel occlusion noted on CTA. Family called and provided verbal consent for tPA. tPA mixed at bedside and given at 1320 - 8.8 mg bolus given over one minute, remaining 79.0 mg given over one hour. Pt brought back to ED and placed in Trauma B. Cardiac monitor in place and patient noted to be NSR. Alert and oriented x4. See Stroke Timeline and NIHSS for specifics.

## 2018-08-31 NOTE — Code Documentation (Signed)
CTP completed and Neurologist called. Neurologist and Interventionalist to talk about patient's case. MD will call back with final decision.

## 2018-08-31 NOTE — Progress Notes (Signed)
Pharmacist Code Stroke Response  Notified to mix tPA at 1303 by Dr. Lorraine Lax Delivered tPA to RN at 1307  tPA dose = 8.8mg  bolus over 1 minute followed by 79mg  for a total dose of 87.8mg  over 1 hour  Issues/delays encountered (if applicable): MD needed to obtain consent from a family member  Ronald [name], Ronald [name] 08/31/18 1:30 PM

## 2018-08-31 NOTE — ED Notes (Signed)
Neurologist wants a CTA perfusion. Pt transported back to CT w RN and Stroke RN.

## 2018-08-31 NOTE — ED Triage Notes (Signed)
Per EMS- pt arrives from home where at 13 he was LSN, pt now has right sided gaze with left sided deficits, no movement or effort against gravity. PT also has slurred speech. Pt is oriented to month situation and self.

## 2018-08-31 NOTE — ED Notes (Signed)
Stroke RN and neurologist speaking with pt family for update and discussion of TPA administration.

## 2018-08-31 NOTE — Code Documentation (Signed)
MD Aroor called and stated patient was going to be taken to IR for diagnostic angiogram with potential mechanical thrombectomy. This RN called IR at this time to update staff on current plan. Lab called as patient's COVID swab is still pending to see if patient could go directly to the IR suite for intubation.

## 2018-08-31 NOTE — Procedures (Signed)
S/P bilateral common carotid arteriograms,followed by balloon angioplasty of occluded supraclinoid RT ICA with RT MCA TICI 3 revascularization

## 2018-08-31 NOTE — Transfer of Care (Signed)
Immediate Anesthesia Transfer of Care Note  Patient: Ronald [name]  Procedure(s) Performed: IR WITH ANESTHESIA (N/A )  Patient Location: NICU  Anesthesia Type:General  Level of Consciousness: sedated, patient cooperative and Patient remains intubated per anesthesia plan  Airway & Oxygen Therapy: Patient remains intubated per anesthesia plan and Patient placed on Ventilator (see vital sign flow sheet for setting)  Post-op Assessment: Report given to RN and Post -op Vital signs reviewed and stable  Post vital signs: Reviewed and stable  Last Vitals:  Vitals Value Taken Time  BP    Temp 35.9 C 08/31/2018  5:45 PM  Pulse 61 08/31/2018  5:51 PM  Resp 15 08/31/2018  5:51 PM  SpO2 100 % 08/31/2018  5:51 PM  Vitals shown include unvalidated device data.  Last Pain:  Vitals:   08/31/18 1745  TempSrc: Axillary         Complications: No apparent anesthesia complications

## 2018-08-31 NOTE — ED Notes (Signed)
Care hand off given to 4N RN.

## 2018-08-31 NOTE — Anesthesia Procedure Notes (Signed)
Arterial Line Insertion Start/End6/06/2018 2:48 PM, 08/31/2018 2:49 PM Performed by: Wilburn Cornelia, CRNA, CRNA  Preanesthetic checklist: patient identified, IV checked, site marked, risks and benefits discussed, surgical consent, monitors and equipment checked, pre-op evaluation, timeout performed and anesthesia consent Left, radial was placed Catheter size: 20 G Hand hygiene performed  and maximum sterile barriers used  Allen's test indicative of satisfactory collateral circulation Attempts: 1 Procedure performed without using ultrasound guided technique. Following insertion, Biopatch and dressing applied. Post procedure assessment: normal  Patient tolerated the procedure well with no immediate complications.

## 2018-09-01 ENCOUNTER — Inpatient Hospital Stay (HOSPITAL_COMMUNITY): Payer: Medicare HMO

## 2018-09-01 ENCOUNTER — Encounter (HOSPITAL_COMMUNITY): Payer: Self-pay | Admitting: Radiology

## 2018-09-01 DIAGNOSIS — I63231 Cerebral infarction due to unspecified occlusion or stenosis of right carotid arteries: Secondary | ICD-10-CM

## 2018-09-01 DIAGNOSIS — Z8673 Personal history of transient ischemic attack (TIA), and cerebral infarction without residual deficits: Secondary | ICD-10-CM

## 2018-09-01 DIAGNOSIS — I639 Cerebral infarction, unspecified: Secondary | ICD-10-CM

## 2018-09-01 DIAGNOSIS — E785 Hyperlipidemia, unspecified: Secondary | ICD-10-CM

## 2018-09-01 DIAGNOSIS — L8990 Pressure ulcer of unspecified site, unspecified stage: Secondary | ICD-10-CM | POA: Diagnosis present

## 2018-09-01 DIAGNOSIS — R1312 Dysphagia, oropharyngeal phase: Secondary | ICD-10-CM

## 2018-09-01 LAB — ECHOCARDIOGRAM COMPLETE: Weight: 3439.18 oz

## 2018-09-01 LAB — POCT I-STAT 7, (LYTES, BLD GAS, ICA,H+H)
Acid-base deficit: 1 mmol/L (ref 0.0–2.0)
Bicarbonate: 22.8 mmol/L (ref 20.0–28.0)
Calcium, Ion: 1.19 mmol/L (ref 1.15–1.40)
HCT: 35 % — ABNORMAL LOW (ref 39.0–52.0)
Hemoglobin: 11.9 g/dL — ABNORMAL LOW (ref 13.0–17.0)
O2 Saturation: 100 %
Patient temperature: 98
Potassium: 3.7 mmol/L (ref 3.5–5.1)
Sodium: 140 mmol/L (ref 135–145)
TCO2: 24 mmol/L (ref 22–32)
pCO2 arterial: 34.4 mmHg (ref 32.0–48.0)
pH, Arterial: 7.428 (ref 7.350–7.450)
pO2, Arterial: 422 mmHg — ABNORMAL HIGH (ref 83.0–108.0)

## 2018-09-01 LAB — CBC WITH DIFFERENTIAL/PLATELET
Abs Immature Granulocytes: 0.04 10*3/uL (ref 0.00–0.07)
Basophils Absolute: 0 10*3/uL (ref 0.0–0.1)
Basophils Relative: 0 %
Eosinophils Absolute: 0 10*3/uL (ref 0.0–0.5)
Eosinophils Relative: 0 %
HCT: 36.6 % — ABNORMAL LOW (ref 39.0–52.0)
Hemoglobin: 11.5 g/dL — ABNORMAL LOW (ref 13.0–17.0)
Immature Granulocytes: 1 %
Lymphocytes Relative: 14 %
Lymphs Abs: 1.2 10*3/uL (ref 0.7–4.0)
MCH: 28.9 pg (ref 26.0–34.0)
MCHC: 31.4 g/dL (ref 30.0–36.0)
MCV: 92 fL (ref 80.0–100.0)
Monocytes Absolute: 0.7 10*3/uL (ref 0.1–1.0)
Monocytes Relative: 8 %
Neutro Abs: 6.5 10*3/uL (ref 1.7–7.7)
Neutrophils Relative %: 77 %
Platelets: 216 10*3/uL (ref 150–400)
RBC: 3.98 MIL/uL — ABNORMAL LOW (ref 4.22–5.81)
RDW: 14.9 % (ref 11.5–15.5)
WBC: 8.5 10*3/uL (ref 4.0–10.5)
nRBC: 0 % (ref 0.0–0.2)

## 2018-09-01 LAB — GLUCOSE, CAPILLARY
Glucose-Capillary: 95 mg/dL (ref 70–99)
Glucose-Capillary: 117 mg/dL — ABNORMAL HIGH (ref 70–99)
Glucose-Capillary: 102 mg/dL — ABNORMAL HIGH (ref 70–99)
Glucose-Capillary: 105 mg/dL — ABNORMAL HIGH (ref 70–99)
Glucose-Capillary: 110 mg/dL — ABNORMAL HIGH (ref 70–99)
Glucose-Capillary: 95 mg/dL (ref 70–99)

## 2018-09-01 LAB — HEMOGLOBIN A1C
Hgb A1c MFr Bld: 5.1 % (ref 4.8–5.6)
Mean Plasma Glucose: 99.67 mg/dL

## 2018-09-01 LAB — RAPID URINE DRUG SCREEN, HOSP PERFORMED
Amphetamines: NOT DETECTED
Barbiturates: NOT DETECTED
Benzodiazepines: NOT DETECTED
Cocaine: NOT DETECTED
Opiates: NOT DETECTED
Tetrahydrocannabinol: NOT DETECTED

## 2018-09-01 LAB — BASIC METABOLIC PANEL
Anion gap: 9 (ref 5–15)
BUN: 12 mg/dL (ref 8–23)
CO2: 20 mmol/L — ABNORMAL LOW (ref 22–32)
Calcium: 8.3 mg/dL — ABNORMAL LOW (ref 8.9–10.3)
Chloride: 112 mmol/L — ABNORMAL HIGH (ref 98–111)
Creatinine, Ser: 0.77 mg/dL (ref 0.61–1.24)
GFR calc Af Amer: >60 mL/min (ref >60)
GFR calc non Af Amer: >60 mL/min (ref >60)
Glucose, Bld: 124 mg/dL — ABNORMAL HIGH (ref 70–99)
Potassium: 3.5 mmol/L (ref 3.5–5.1)
Sodium: 141 mmol/L (ref 135–145)

## 2018-09-01 LAB — LIPID PANEL
Cholesterol: 108 mg/dL (ref 0–200)
HDL: 30 mg/dL — ABNORMAL LOW (ref >40)
LDL Cholesterol: 53 mg/dL (ref 0–99)
Total CHOL/HDL Ratio: 3.6 RATIO
Triglycerides: 124 mg/dL (ref <150)
VLDL: 25 mg/dL (ref 0–40)

## 2018-09-01 LAB — TRIGLYCERIDES: Triglycerides: 125 mg/dL (ref <150)

## 2018-09-01 LAB — MAGNESIUM: Magnesium: 1.8 mg/dL (ref 1.7–2.4)

## 2018-09-01 LAB — PHOSPHORUS: Phosphorus: 3 mg/dL (ref 2.5–4.6)

## 2018-09-01 MED ORDER — ATORVASTATIN CALCIUM 80 MG PO TABS
80.0000 mg | ORAL_TABLET | Freq: Every day | ORAL | Status: DC
  Administered 2018-09-01 – 2018-09-14 (×14): 80 mg
  Filled 2018-09-01 (×13): qty 1

## 2018-09-01 MED ORDER — CLEVIDIPINE BUTYRATE 0.5 MG/ML IV EMUL: 0.0000 mg/h | INTRAVENOUS | Status: AC

## 2018-09-01 NOTE — Progress Notes (Signed)
Initial Nutrition Assessment RD working remotely.  DOCUMENTATION CODES:   Obesity unspecified  INTERVENTION:   If remains intubated recommend  Vital High Protein @ 20 ml/hr 60 ml Prostat QID MVI daily  Provides: 1280 kcal, 162 grams protein, and 401 ml free water TF regimen and propofol and cleviprex at current rate providing 3781 total kcal/day   NUTRITION DIAGNOSIS:   Inadequate oral intake related to inability to eat as evidenced by NPO status.  GOAL:   Provide needs based on ASPEN/SCCM guidelines  MONITOR:   Vent status  REASON FOR ASSESSMENT:   Ventilator    ASSESSMENT:   Pt with PMH of cancer, dementia, depression, HLD, HTN, schizophrenia now admitted with R ICA stroke s/p IR for balloon angioplasty.    Remains on vent  Patient is currently intubated on ventilator support MV: 8.6 L/min Temp (24hrs), Avg:97.4 F (36.3 C), Min:96.6 F (35.9 C), Max:98.1 F (36.7 C)  Propofol: 29.3 ml/hr (50 mcg) provides: 773 kcal  Cleviprex: 36 ml/hr provides: 1728 kcal   NUTRITION - FOCUSED PHYSICAL EXAM:  Deferred   Diet Order:   Diet Order            Diet NPO time specified  Diet effective now              EDUCATION NEEDS:   No education needs have been identified at this time  Skin:  Skin Assessment: Skin Integrity Issues: Skin Integrity Issues:: Stage I Stage I: L toe  Last BM:  unknown  Height:   Ht Readings from Last 1 Encounters:  05/23/18 5\' 7"  (1.702 m)    Weight:   Wt Readings from Last 1 Encounters:  08/31/18 97.5 kg    Ideal Body Weight:  67.2 kg  BMI:  Body mass index is 33.67 kg/m.  Estimated Nutritional Needs:   Kcal:  1200-1400  Protein:  >135 grams  Fluid:  > 1.5 L/day  Maylon Peppers RD, LDN, CNSC [phone] Pager [phone] After Hours Pager

## 2018-09-01 NOTE — Progress Notes (Addendum)
PT Cancellation Note  Patient Details Name: Ronald [name] MRN: [id number] DOB: [birth year]   Cancelled Treatment:    Reason Eval/Treat Not Completed: Active bedrest order, intubated.  Ellamae Sia, PT, DPT Acute Rehabilitation Services Pager [phone] Office [phone]    Willy Eddy 09/01/2018, 7:48 AM

## 2018-09-01 NOTE — Progress Notes (Signed)
Transported pt to MRI with RN at bedside

## 2018-09-01 NOTE — Progress Notes (Signed)
OT Cancellation Note  Patient Details Name: Ronald [name] MRN: [id number] DOB: 1945/04/10   Cancelled Treatment:    Reason Eval/Treat Not Completed: Patient not medically ready;Active bedrest order.  Will reattempt as appropriate.  Lucille Passy, OTR/L [place] Pager [phone] Office [phone]   Lucille Passy M 09/01/2018, 11:27 AM

## 2018-09-01 NOTE — Progress Notes (Signed)
STROKE TEAM PROGRESS NOTE   INTERVAL HISTORY Pt RN and echo tech are at bedside. Pt still intubated on sedation and cleviprex. As per RN, he still has significant left sided weakness even with sedation off. However, he did follow commands on the right. Pending MRI and MRA and sheath removal.   Vitals:   09/01/18 0630 09/01/18 0700 09/01/18 0730 09/01/18 0814  BP: (!) 137/57 (!) 131/53 (!) 118/48   Pulse: 61 63 65 80  Resp: 16 16 16 20   Temp:      TempSrc:      SpO2: 100% 100% 100% 100%  Weight:        CBC:  Recent Labs  Lab 08/31/18 1259  09/01/18 0406 09/01/18 0500  WBC 9.3  --   --  8.5  NEUTROABS 6.9  --   --  6.5  HGB 12.6*   < > 11.9* 11.5*  HCT 41.5   < > 35.0* 36.6*  MCV 93.5  --   --  92.0  PLT 236  --   --  216   < > = values in this interval not displayed.    Basic Metabolic Panel:  Recent Labs  Lab 08/31/18 1259 08/31/18 1306 09/01/18 0406 09/01/18 0500  NA 138 136 140 141  K 4.6 4.8 3.7 3.5  CL 103 104  --  112*  CO2 25  --   --  20*  GLUCOSE 132* 132*  --  124*  BUN 10 14  --  12  CREATININE 1.08 1.10  --  0.77  CALCIUM 9.3  --   --  8.3*  MG  --   --   --  1.8  PHOS  --   --   --  3.0   Lipid Panel:     Component Value Date/Time   CHOL 108 09/01/2018 0500   TRIG 124 09/01/2018 0500   TRIG 125 09/01/2018 0500   HDL 30 (L) 09/01/2018 0500   CHOLHDL 3.6 09/01/2018 0500   VLDL 25 09/01/2018 0500   LDLCALC 53 09/01/2018 0500   HgbA1c:  Lab Results  Component Value Date   HGBA1C 5.1 09/01/2018   Urine Drug Screen:     Component Value Date/Time   LABOPIA NONE DETECTED 05/24/2018 1600   COCAINSCRNUR NONE DETECTED 05/24/2018 1600   LABBENZ NONE DETECTED 05/24/2018 1600   AMPHETMU NONE DETECTED 05/24/2018 1600   THCU NONE DETECTED 05/24/2018 1600   LABBARB NONE DETECTED 05/24/2018 1600    Alcohol Level     Component Value Date/Time   ETH <5 07/14/2016 1952    IMAGING Ct Head Code Stroke Wo Contrast 08/31/2018 1. Stable non contrast  CT appearance of the brain since February. Chronic right parietal lobe encephalomalacia. ASPECTS is 10.   Ct Angio Head W Or Wo Contrast Ct Angio Neck W Or Wo Contrast 08/31/2018 1. 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 Stable right ICA terminus and right MCA branches 2. However, there is new stenosis of the Right ACA A2, at least moderate. 3. Otherwise stable intracranial CTA findings since February, including: - chronic right vertebral artery occlusion with faint reconstitution at the skull base. -severe right PCA P1 stenosis.   Ct Cerebral Perfusion W Contrast 08/31/2018 Largely unchanged CT perfusion abnormality in the right hemisphere since the February CTP, with actually slightly improved right hemisphere perfusion parameters in terms of CBF  and the hypoperfusion index since that time. Electronically Signed   By: Genevie Ann M.D.   On: 08/31/2018 14:49   Cerebral Angio 08/31/2018 S/P bilateral common carotid arteriograms,followed by balloon angioplasty of occluded supraclinoid RT ICA with RT MCA TICI 3 revascularization  Dg Chest Port 1 View 09/01/2018 Endotracheal tube in grossly good position. Nasogastric tube has been partially withdrawn, with distal tip in expected position of proximal thoracic esophagus; advancement is recommended. Mild right basilar subsegmental atelectasis.  Dg Chest Port 1 View 08/31/2018 1. The endotracheal tube is in good position, 2.5 cm above the carina. 2. The NG tube could be advanced several cm. 3. No acute cardiopulmonary findings.    PHYSICAL EXAM  Temp:  [96.6 F (35.9 C)-98.1 F (36.7 C)] 98.1 F (36.7 C) (06/05 0814) Pulse Rate:  [55-109] 74 (06/05 0930) Resp:  [14-24] 17 (06/05 0930) BP: (102-170)/(48-100) 142/51 (06/05 0930) SpO2:  [98 %-100 %] 100 % (06/05 0930) Arterial Line BP: (95-270)/(45-261) 139/56 (06/05 0930) FiO2 (%):  [40  %-50 %] 40 % (06/05 0814) Weight:  [97.5 kg] 97.5 kg (06/04 1332)  General - Well nourished, well developed, intubated on sedation.  Ophthalmologic - fundi not visualized due to noncooperation.  Cardiovascular - Regular rate and rhythm.  Neuro - intubated on sedation, eyes open with mid upper gaze position at rest, however, able to follow commands with right gaze but incomplete left gaze, following commands on the right hand and toe. Inconsistently blinking to visual threat, able to track to right, PERRL. Corneal reflex present on the right but not on the left, gag and cough present. Breathing over the vent.  Facial symmetry not able to test due to ET tube.  Tongue midline in mouth. RUE spontaneous movement 2+/5, able to follow commands with right hand. LUE flaccid. RLE straight due to femoral sheath. LLE slight movement of toes on pain stimulation. DTR diminished and no babinski. Sensation, coordination not cooperative and gait not tested.   ASSESSMENT/PLAN [name] is a 73 y.o. male with history of stroke, schizophrenia, measles, hypertension, hyperlipidemia, depression, dementia presenting with L sided weakness, R gaze deviation and dysarthria. Received IV tPA 08/31/2018 at 1320. Taken to IR with angioplasty and TICI3 of right MCA.  Stroke: R MCA infarct  s/p tPA and angioplasty R terminal ICA, infarct  secondary to large vessel disease source  Code Stroke CT head No acute stroke. Old R parietal lobe encephalomalacia.  ASPECTS 10.     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 New stenosis R ACA A2.   CT perfusion large penumbra, essentially unchanged since Feb  Cerebral angio R terminal ICA angioplasty w. TICI 3 revascularization  Post IR CT no ICH, mass effect or shift  MRI  pending  MRA pending  2D Echo pending  LDL 53  HgbA1c 5.1  SCDs for VTE prophylaxis  clopidogrel 75 mg daily prior  to admission, now on no antithrombotics within 24h of TPA. Plan for aspirin 325 mg daily and clopidogrel 75 mg daily once MRI completed and no bleeding  Therapy recommendations:  pending   Disposition:  pending   Respiratory Insufficiency  Intubated for IR  CCM on board  Plan for possible extubation after sheath removal and MRI/MRA  On sedation but able to follow commands.  Right ICA occlusion  In 06/2016 CTA showed right ICA occlusion at ophthalmic level with distal reconstitution  04/2018 admission CTA  showed right ICA occlusion at bifurcation, progressed from 06/2016  This admission still with R ICA occlusion at ophthalmic level though some recannalization  S/p IR with terminal ICA angioplasty with TICI3 reperfusion  Avoid low BP  History of stroke/TIA  04/2018 TIA due to right ICA occlusion in the setting of hypotension. MRI neg. Found to have R ICA occlusion at origin, R ICA siphon occluded 2 years prior. On plavix. Home with family though difficulty to care for.  06/2016 admitted for a fall and difficulty walking. Also had a cough and fever due to left lower lobe PNA. MRI showed right MCA/ACA, MCA/PCA infarcts. CTA head neck showed right ICA occlusion at the ophthalmic level with distal reconstitution, right P1 stenosis and right VA occlusion. IR also confirmed right ICA occlusion, no intervention offered. LDL 86 and A1c 5.4, patient discharged with DAPT for 3 months and Lipitor 80.  Hypertension  Home meds:  Lisinopril 20  Now on cleviprex post IR  Stable . BP goal 120-150  24h post IR  Hyperlipidemia  Home meds:  lipitor 80, resumed in hospital  LDL 53, goal < 70  Continue statin at discharge  Dysphagia  Secondary to stroke  Diet - NPO  Check swallow once extubated  Other Stroke Risk Factors  Advanced age  Obesity, Body mass index is 33.67 kg/m., recommend weight loss, diet and exercise as appropriate   Other Active Problems  Dementia at  baseline  Bipolar disorder  Prostate cancer  Hospital day # 1  This patient is critically ill due to right MCA infarct, right ICA occlusion, history of stroke/TIA, s/p tPA and at significant risk of neurological worsening, death form recurrent stroke, hemorrhagic conversion, heart failure, seizure, aspiration PNA. This patient's care requires constant monitoring of vital signs, hemodynamics, respiratory and cardiac monitoring, review of multiple databases, neurological assessment, discussion with family, other specialists and medical decision making of high complexity. I spent 40 minutes of neurocritical care time in the care of this patient. I also discussed with Dr. Estanislado Pandy and Dr. Lorraine Lax about this case.  [name], MD PhD Stroke Neurology 09/01/2018 10:21 AM  To contact Stroke Continuity provider, please refer to [email or web address] After hours, contact General Neurology

## 2018-09-01 NOTE — Progress Notes (Signed)
Referring Physician(s): Code Stroke- Aroor, Karena Addison R  Supervising Physician: Luanne Bras  Patient Status:  [facility] - In-pt  Chief Complaint: None  Subjective:  Right ICA occlusion s/p emergent mechanical thrombectomy achieving a TICI 3 revascularization of right MCA 08/31/2018 by Dr. Estanislado Pandy. Patient laying in bed intubated with sedation. Right groin incision c/d/i with sheath in place.   Allergies: Dust mite extract; Pollen extract; and Rye grass flower pollen extract [gramineae pollens]  Medications: Prior to Admission medications   Medication Sig Start Date End Date Taking? Authorizing Provider  atorvastatin (LIPITOR) 80 MG tablet Take 80 mg by mouth daily. 01/23/18   [provider]  Calcium-Vitamin D 600-200 MG-UNIT per tablet Take 1 tablet by mouth daily.    [provider]  clopidogrel (PLAVIX) 75 MG tablet Take 1 tablet (75 mg total) by mouth daily. 07/22/16   Jani Gravel, MD  finasteride (PROSCAR) 5 MG tablet Take 1 tablet (5 mg total) by mouth daily. 07/22/16   Jani Gravel, MD  fluticasone (FLONASE) 50 MCG/ACT nasal spray Place 1 spray into both nostrils daily.    [provider]  lisinopril (PRINIVIL,ZESTRIL) 20 MG tablet Take 1 tablet (20 mg total) by mouth daily. 05/26/18   Donzetta Starch, NP     Vital Signs: BP 128/66   Pulse 74   Temp 98.1 F (36.7 C) (Axillary)   Resp 19   Wt 214 lb 15.2 oz (97.5 kg)   SpO2 100%   BMI 33.67 kg/m   Physical Exam Vitals signs and nursing note reviewed.  Constitutional:      General: He is not in acute distress.    Appearance: Normal appearance.     Comments: Intubated with sedation.  Pulmonary:     Effort: Pulmonary effort is normal. No respiratory distress.     Comments: Intubated with sedation. Skin:    Comments: Right groin incision soft with sheath in place, no active bleeding or hematoma.  Neurological:     Comments: Intubated with sedation. PERRL bilaterally. Can spontaneously  move right side, wiggles bilateral toes, no spontaneous movements of LUE. Distal pulses palpable bilaterally with Doppler.  Psychiatric:     Comments: Intubated with sedation.     Imaging: Ct Angio Head W Or Wo Contrast  Result Date: 08/31/2018 CLINICAL DATA:  73 year old male code stroke. 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 S/p IV tPA EXAM: CT ANGIOGRAPHY HEAD AND NECK TECHNIQUE: Multidetector CT imaging of the head and neck was performed using the standard protocol during bolus administration of intravenous contrast. Multiplanar CT image reconstructions and MIPs were obtained to evaluate the vascular anatomy. Carotid stenosis measurements (when applicable) are obtained utilizing NASCET criteria, using the distal internal carotid diameter as the denominator. CONTRAST:  24mL OMNIPAQUE IOHEXOL 350 MG/ML SOLN COMPARISON:  Plain head CT today. Brain MRI 05/24/2018. CTA head and neck 05/23/2018, and earlier. FINDINGS: CTA NECK Skeleton: No acute osseous abnormality identified. Upper chest: Negative. Other neck: Stable, negative. Aortic arch: Moderate aortic atherosclerosis appears stable. Three vessel arch configuration. Right carotid system: Negative brachiocephalic artery and proximal CCA. Stable mild soft and calcified plaque at the right carotid bifurcation. Substantially improved enhancement of the cervical right ICA compared to the February CTA, although the vessel remains diminutive to the skull base. See intracranial findings below. Left carotid system: No left CCA origin stenosis despite plaque. Mild tortuosity of the left CCA. Stable soft plaque at the medial left ICA origin with less than 50 % stenosis with respect  to the distal vessel. Vertebral arteries: Chronically occluded right vertebral artery from the origin distally. There is stable reconstitution at the distal right V2/V3 segment as before. The vessel remains faintly enhancing to the skull base as before. Proximal left  subclavian artery and left vertebral artery origin are stable without stenosis. The left vertebral artery appears somewhat dominant and remains patent to the skull base without stenosis. CTA HEAD Posterior circulation: The right V4 segment remains patent although is probably mostly supplied from the left. The right PICA origin remains patent. The dominant left vertebral artery supplies the basilar. The left PICA origin remains patent. Patent basilar artery without stenosis. SCA and PCA origins are stable and patent. Left posterior communicating artery redemonstrated and patent. The right Posterior communicating arteries are diminutive or absent. Left PCA branches are stable and within normal limits. 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 The right P2 and distal branches are stable and within normal limits. Anterior circulation: There is faint enhancement of the right ICA siphon today which is highly irregular. As on the 2018 angiogram the siphon appears functionally occluded at the level of the ophthalmic artery ((series 5, image 51). Despite this, the right ICA terminus remains patent, and has a similar appearance to the February CTA. The right MCA and ACA origins remain patent. The right M1 segment, trifurcation, and right MCA branches appear stable. The ACA A1 segments are stable and patent. Diminutive or absent anterior communicating artery. There is new attenuated enhancement of the right ACA A2 on series 12, image 21, this segment was normal in February. There is preserved distal right ACA enhancement. Left ACA appears stable and within normal limits. Left MCA M1 and bifurcation are patent. Left MCA branches are stable. Venous sinuses: Early venous contrast phase but grossly patent. Anatomic variants: Dominant left vertebral artery. Review of the MIP images confirms the above findings IMPRESSION: 1. Improved cervical right ICA and right siphon since the CT in February, however there is persistent  occlusion of the right ICA at the ophthalmic artery origin. Stable right ICA terminus and right MCA branches 2. However, there is new stenosis of the Right ACA A2, at least moderate. 3. Otherwise stable intracranial CTA findings since February, including: - chronic right vertebral artery occlusion with faint reconstitution at the skull base. -severe right PCA P1 stenosis. 4. This study was reviewed in person Study discussed by telephone with Dr. Lorraine Lax 08/31/2018 at 1330 hours. Electronically Signed   By: Genevie Ann M.D.   On: 08/31/2018 13:47   Ct Angio Neck W Or Wo Contrast  Result Date: 08/31/2018 CLINICAL DATA:  73 year old male code stroke. 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 S/p IV tPA EXAM: CT ANGIOGRAPHY HEAD AND NECK TECHNIQUE: Multidetector CT imaging of the head and neck was performed using the standard protocol during bolus administration of intravenous contrast. Multiplanar CT image reconstructions and MIPs were obtained to evaluate the vascular anatomy. Carotid stenosis measurements (when applicable) are obtained utilizing NASCET criteria, using the distal internal carotid diameter as the denominator. CONTRAST:  53mL OMNIPAQUE IOHEXOL 350 MG/ML SOLN COMPARISON:  Plain head CT today. Brain MRI 05/24/2018. CTA head and neck 05/23/2018, and earlier. FINDINGS: CTA NECK Skeleton: No acute osseous abnormality identified. Upper chest: Negative. Other neck: Stable, negative. Aortic arch: Moderate aortic atherosclerosis appears stable. Three vessel arch configuration. Right carotid system: Negative brachiocephalic artery and proximal CCA. Stable mild soft and calcified plaque at the right carotid bifurcation. Substantially improved enhancement of the cervical  right ICA compared to the February CTA, although the vessel remains diminutive to the skull base. See intracranial findings below. Left carotid system: No left CCA origin stenosis despite plaque. Mild tortuosity of the left CCA. Stable soft  plaque at the medial left ICA origin with less than 50 % stenosis with respect to the distal vessel. Vertebral arteries: Chronically occluded right vertebral artery from the origin distally. There is stable reconstitution at the distal right V2/V3 segment as before. The vessel remains faintly enhancing to the skull base as before. Proximal left subclavian artery and left vertebral artery origin are stable without stenosis. The left vertebral artery appears somewhat dominant and remains patent to the skull base without stenosis. CTA HEAD Posterior circulation: The right V4 segment remains patent although is probably mostly supplied from the left. The right PICA origin remains patent. The dominant left vertebral artery supplies the basilar. The left PICA origin remains patent. Patent basilar artery without stenosis. SCA and PCA origins are stable and patent. Left posterior communicating artery redemonstrated and patent. The right Posterior communicating arteries are diminutive or absent. Left PCA branches are stable and within normal limits. 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 The right P2 and distal branches are stable and within normal limits. Anterior circulation: There is faint enhancement of the right ICA siphon today which is highly irregular. As on the 2018 angiogram the siphon appears functionally occluded at the level of the ophthalmic artery ((series 5, image 51). Despite this, the right ICA terminus remains patent, and has a similar appearance to the February CTA. The right MCA and ACA origins remain patent. The right M1 segment, trifurcation, and right MCA branches appear stable. The ACA A1 segments are stable and patent. Diminutive or absent anterior communicating artery. There is new attenuated enhancement of the right ACA A2 on series 12, image 21, this segment was normal in February. There is preserved distal right ACA enhancement. Left ACA appears stable and within normal limits. Left  MCA M1 and bifurcation are patent. Left MCA branches are stable. Venous sinuses: Early venous contrast phase but grossly patent. Anatomic variants: Dominant left vertebral artery. Review of the MIP images confirms the above findings IMPRESSION: 1. 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 Stable right ICA terminus and right MCA branches 2. However, there is new stenosis of the Right ACA A2, at least moderate. 3. Otherwise stable intracranial CTA findings since February, including: - chronic right vertebral artery occlusion with faint reconstitution at the skull base. -severe right PCA P1 stenosis. 4. This study was reviewed in person Study discussed by telephone with Dr. Lorraine Lax 08/31/2018 at 1330 hours. Electronically Signed   By: Genevie Ann M.D.   On: 08/31/2018 13:47   Ct Cerebral Perfusion W Contrast  Result Date: 08/31/2018 CLINICAL DATA:  73 year old male with history of chronic right ICA and vertebral artery occlusions, but evidence of right ICA recanalization since a February CTA. Presents today with right hemisphere ischemia symptoms. EXAM: CT PERFUSION BRAIN TECHNIQUE: Multiphase CT imaging of the brain was performed following IV bolus contrast injection. Subsequent parametric perfusion maps were calculated using RAPID software. CONTRAST:  50 milliliters Omnipaque 350. COMPARISON:  CTA head and neck earlier today. CT perfusion and CTA 05/23/2018. FINDINGS: CT Brain Perfusion Findings: CBF (<30%) Volume: Zero. And there is also no less than 38% reduction in CBF (versus previously 12 mL  in February) Perfusion (Tmax>6.0s) volume: 365mL Versus 335 mL in February, but visually the right hemisphere distribution of prolonged T-max is unchanged. Hypoperfusion index of 0.4 (a favorable decrease from 0.6 in February). Mismatch Volume: 383mL ASPECTS on noncontrast CT Head: 10 at 1303 hours today.  Infarction Location:Not applicable IMPRESSION: Largely unchanged CT perfusion abnormality in the right hemisphere since the February CTP, with actually slightly improved right hemisphere perfusion parameters in terms of CBF and the hypoperfusion index since that time. Electronically Signed   By: Genevie Ann M.D.   On: 08/31/2018 14:49   Dg Chest Port 1 View  Result Date: 09/01/2018 CLINICAL DATA:  Endotracheal tube placement. EXAM: PORTABLE CHEST 1 VIEW COMPARISON:  Radiograph of August 31, 2018. FINDINGS: The heart size and mediastinal contours are within normal limits. Endotracheal tube is in grossly good position. Nasogastric tube has been significant withdrawn, with distal tip and proximal esophagus. No pneumothorax or pleural effusion is noted. Left lung is clear. Mild right basilar subsegmental atelectasis is noted. The visualized skeletal structures are unremarkable. IMPRESSION: Endotracheal tube in grossly good position. Nasogastric tube has been partially withdrawn, with distal tip in expected position of proximal thoracic esophagus; advancement is recommended. Mild right basilar subsegmental atelectasis. Electronically Signed   By: Marijo Conception M.D.   On: 09/01/2018 08:24   Dg Chest Port 1 View  Result Date: 08/31/2018 CLINICAL DATA:  Intubation. EXAM: PORTABLE CHEST 1 VIEW COMPARISON:  05/23/2018 FINDINGS: The endotracheal tube is 2.5 cm above the carina. The NG tube is coursing down the esophagus. The tip is in the fundus near the GE junction in the proximal port is in the distal esophagus. This could be advanced several cm. The heart is normal in size given the AP projection and supine position of the patient. The lungs are clear. No pleural effusion or pneumothorax. The bony thorax is intact. IMPRESSION: 1. The endotracheal tube is in good position, 2.5 cm above the carina. 2. The NG tube could be advanced several cm. 3. No acute cardiopulmonary findings. Electronically Signed   By: Marijo Sanes  M.D.   On: 08/31/2018 19:12   Ct Head Code Stroke Wo Contrast  Result Date: 08/31/2018 CLINICAL DATA:  Code stroke. 73 year old male. 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 EXAM: CT HEAD WITHOUT CONTRAST TECHNIQUE: Contiguous axial images were obtained from the base of the skull through the vertex without intravenous contrast. COMPARISON:  Brain MRI 05/24/2018 and earlier. FINDINGS: Brain: Chronic right parietal lobe encephalomalacia. No acute intracranial hemorrhage identified. No midline shift, mass effect, or evidence of intracranial mass lesion. No cortically based acute infarct identified. Stable gray-white matter differentiation throughout the brain. No ventriculomegaly. Vascular: Mild Calcified atherosclerosis at the skull base. No suspicious intracranial vascular hyperdensity. Skull: No acute osseous abnormality identified. Sinuses/Orbits: Hyperplastic sinuses. Visualized paranasal sinuses and mastoids are stable and well pneumatized. Other: Rightward gaze deviation. No acute scalp soft tissue findings. ASPECTS ([place] Stroke Program Early CT Score) - Ganglionic level infarction (caudate, lentiform nuclei, internal capsule, insula, M1-M3 cortex): 7 - Supraganglionic infarction (M4-M6 cortex): 3 Total score (0-10 with 10 being normal): 10 (chronic right parietal lobe encephalomalacia) IMPRESSION: 1. Stable non contrast CT appearance of the brain since February. Chronic right parietal lobe encephalomalacia. ASPECTS is 10. 2. These results were communicated to Dr. Lorraine Lax at 1:11 pm on 08/31/2018 by text page via the [facility] messaging system. Electronically Signed   By: Genevie Ann M.D.   On: 08/31/2018 13:11    Labs:  CBC: Recent Labs    05/24/18 0008 05/25/18 0546 08/31/18 1259 08/31/18 1306 09/01/18 0406 09/01/18 0500  WBC 6.7 5.8 9.3  --   --  8.5  HGB 10.8* 11.3* 12.6* 13.6 11.9* 11.5*  HCT 36.2* 36.9* 41.5 40.0 35.0* 36.6*  PLT 255 239 236  --   --  216    COAGS: Recent Labs     05/23/18 1840 08/31/18 1259  INR 1.2 1.1  APTT 35 28    BMP: Recent Labs    05/24/18 0008 05/25/18 0546 08/31/18 1259 08/31/18 1306 09/01/18 0406 09/01/18 0500  NA 138 139 138 136 140 141  K 4.3 4.1 4.6 4.8 3.7 3.5  CL 107 106 103 104  --  112*  CO2 23 26 25   --   --  20*  GLUCOSE 148* 104* 132* 132*  --  124*  BUN 25* 12 10 14   --  12  CALCIUM 8.6* 8.9 9.3  --   --  8.3*  CREATININE 1.04 0.79 1.08 1.10  --  0.77  GFRNONAA >60 >60 >60  --   --  >60  GFRAA >60 >60 >60  --   --  >60    LIVER FUNCTION TESTS: Recent Labs    05/23/18 1840 05/24/18 0008 08/31/18 1259  BILITOT 1.7*  --  0.9  AST 42*  --  23  ALT 26  --  17  ALKPHOS 86  --  120  PROT 6.0*  --  7.1  ALBUMIN 3.4* 3.1* 3.6    Assessment and Plan:  Right ICA occlusion s/p emergent mechanical thrombectomy achieving a TICI 3 revascularization of right MCA 08/31/2018 by Dr. Estanislado Pandy. Patients condition stable- remains intubated/sedated, moving right side and wiggles bilateral toes but no movements of LUE. Right groin stable- plan for sheath removal today. Plan for MRI/MRA today. Appreciate and agree with neurology management. NIR to follow.   Electronically Signed: Earley Abide, PA-C 09/01/2018, 9:38 AM   I spent a total of 25 Minutes at the the patient's bedside AND on the patient's hospital floor or unit, greater than 50% of which was counseling/coordinating care for right ICA occlusion s/p revascularization.

## 2018-09-01 NOTE — Procedures (Signed)
Right 8 french sheath removal.  No hematoma present before sheath pull and pulses present.  Deployed 7 french Exoseal.  Held manual pressure for 10 min.  No complications.  Pulses still present after procedure.  Applied gauze and Tegaderm. Verified site with [facility] RN  France Ravens RT [place] RT R

## 2018-09-01 NOTE — Progress Notes (Signed)
NAME:  Ronald [name], MRN:  [id number], DOB:  Sep 30, 1945, LOS: 1 ADMISSION DATE:  08/31/2018, CONSULTATION DATE:  6/4 REFERRING MD:  Aroor, CHIEF COMPLAINT:  CVA  Brief History   73 year old male with chronic rt ICA occlusion was admitted for CVA.  Received TPA systemically in the ED and was taken to IR for revascularization.  Post IR remained on the ventilator was transferred to the ICU.  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  Significant Hospital Events   6/4 CVA< admit, TPA, IR. To ICU on vent.   Consults:  IR PCCM  Procedures:  6/4 balloon angioplasty of R ICA via neuro IR  Significant Diagnostic Tests:  CTH 6/4 > Stable non contrast CT appearance of the brain since February. Chronic right parietal lobe encephalomalacia. CT angio perfusion head/neck 6/4 > Improved cervical right ICA and right siphon since the CT in February, however there is persistent occlusion of the right ICA at the ophthalmic artery origin. Stable right ICA terminus and right MCA branches. However, there is new stenosis of the Right ACA A2, at least moderate.  Micro Data:  covid > neg  Antimicrobials:   cefazolin peri-op  Interim history/subjective:  No acute events.  Remains on the vent and Cleviprex drip.  Objective   Blood pressure (!) 118/48, pulse 80, temperature 97.6 F (36.4 C), temperature source Oral, resp. rate 20, weight 97.5 kg, SpO2 100 %.    Vent Mode: PRVC FiO2 (%):  [40 %-50 %] 40 % Set Rate:  [16 bmp] 16 bmp Vt Set:  [550 mL] 550 mL PEEP:  [5 cmH20] 5 cmH20 Plateau Pressure:  [16 cmH20-19 cmH20] 17 cmH20   Intake/Output Summary (Last 24 hours) at 09/01/2018 0829 Last data filed at 09/01/2018 0600 Gross per 24 hour  Intake 1419.03 ml  Output 720 ml  Net 699.03 ml   Filed Weights   08/31/18 1200 08/31/18  1332  Weight: 97.5 kg 97.5 kg    Examination: Gen:      No acute distress HEENT:  EOMI, sclera anicteric Neck:     No masses; no thyromegaly, ET tube Lungs:    Clear to auscultation bilaterally; normal respiratory effort CV:         Regular rate and rhythm; no murmurs Abd:      + bowel sounds; soft, non-tender; no palpable masses, no distension Ext:    No edema; adequate peripheral perfusion Skin:      Warm and dry; no rash Neuro: Arousable on the vent.  Follows simple commands  Resolved Hospital Problem list     Assessment & Plan:  Acute CVA: s/p TPA and IR Continue Cleviprex drip for goal SBP 1 20-1 40 Plavix Management per stroke service.  Respiratory insufficiency  On minimal vent settings. Weaning trials later today after femoral sheath is out and MRI completed  Hyperglycemia with no DM history SSI coverage  Hypertension Holding home lisinopril  Dementia Schizophrenia uncertain baseline Follow mental status  Best practice:  Diet: NPO Pain/Anxiety/Delirium protocol (if indicated): Propofol to keep RASS 0 to -1. PRN fentanyl.  VAP protocol (if indicated): per protocol DVT prophylaxis: SCD GI prophylaxis: Protonix Glucose control: SSI Mobility: BR Code Status: FULL Family Communication:  Disposition: ICU  Labs   CBC: Recent Labs  Lab 08/31/18 1259 08/31/18 1306 09/01/18 0406 09/01/18 0500  WBC 9.3  --   --  8.5  NEUTROABS 6.9  --   --  6.5  HGB 12.6* 13.6 11.9* 11.5*  HCT 41.5 40.0 35.0* 36.6*  MCV 93.5  --   --  92.0  PLT 236  --   --  865    Basic Metabolic Panel: Recent Labs  Lab 08/31/18 1259 08/31/18 1306 09/01/18 0406 09/01/18 0500  NA 138 136 140 141  K 4.6 4.8 3.7 3.5  CL 103 104  --  112*  CO2 25  --   --  20*  GLUCOSE 132* 132*  --  124*  BUN 10 14  --  12  CREATININE 1.08 1.10  --  0.77  CALCIUM 9.3  --   --  8.3*  MG  --   --   --  1.8  PHOS  --   --   --  3.0   GFR: Estimated Creatinine Clearance: 92.9 mL/min (by C-G  formula based on SCr of 0.77 mg/dL). Recent Labs  Lab 08/31/18 1259 09/01/18 0500  WBC 9.3 8.5    Liver Function Tests: Recent Labs  Lab 08/31/18 1259  AST 23  ALT 17  ALKPHOS 120  BILITOT 0.9  PROT 7.1  ALBUMIN 3.6   No results for input(s): LIPASE, AMYLASE in the last 168 hours. No results for input(s): AMMONIA in the last 168 hours.  ABG    Component Value Date/Time   PHART 7.428 09/01/2018 0406   PCO2ART 34.4 09/01/2018 0406   PO2ART 422.0 (H) 09/01/2018 0406   HCO3 22.8 09/01/2018 0406   TCO2 24 09/01/2018 0406   ACIDBASEDEF 1.0 09/01/2018 0406   O2SAT 100.0 09/01/2018 0406     Coagulation Profile: Recent Labs  Lab 08/31/18 1259  INR 1.1    Cardiac Enzymes: No results for input(s): CKTOTAL, CKMB, CKMBINDEX, TROPONINI in the last 168 hours.  HbA1C: Hgb A1c MFr Bld  Date/Time Value Ref Range Status  09/01/2018 05:00 AM 5.1 4.8 - 5.6 % Final    Comment:    (NOTE) Pre diabetes:          5.7%-6.4% Diabetes:              >6.4% Glycemic control for   <7.0% adults with diabetes   05/24/2018 12:08 AM 5.1 4.8 - 5.6 % Final    Comment:    (NOTE) Pre diabetes:          5.7%-6.4% Diabetes:              >6.4% Glycemic control for   <7.0% adults with diabetes     CBG: Recent Labs  Lab 08/31/18 1258 08/31/18 1931 08/31/18 2321 09/01/18 0312 09/01/18 0811  GLUCAP 123* 94 105* 95 117*   The patient is critically ill with multiple organ system failure and requires high complexity decision making for assessment and support, frequent evaluation and titration of therapies, advanced monitoring, review of radiographic studies and interpretation of complex data.   Critical Care Time devoted to patient care services, exclusive of separately billable procedures, described in this note is 35 minutes.   Marshell Garfinkel MD [facility] Pager [phone] If no answer call [phone] 09/01/2018, 8:29 AM

## 2018-09-01 NOTE — ED Provider Notes (Signed)
[place] NEURO/TRAUMA/SURGICAL ICU Provider Note   CSN: [id number] Arrival date & time: 08/31/18  1301    History   Chief Complaint Chief Complaint  Patient presents with  . Code Stroke    HPI Ronald [name] is a 73 y.o. male.     HPI Level 5 caveat for acuity of the condition.  73 yo male presents to the ER from home with complaints of new onset of facial droop, dysarthria, and worsening left sided weakness. Pt lives with brother and is reported to be last known normal at 23 when he slumped over and family called EMS.  Patient denies any chest pain  Stroke team at the bedside.  Patient has history of stroke, right ICA stenosis with known prior history of left-sided weakness.  Past Medical History:  Diagnosis Date  . Cancer [facility])    Prostate  . Chicken pox   . Dementia ([place])   . Depression   . Heart disease   . Hyperlipemia   . Hypertension   . Measles   . Mumps   . 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 Stroke [facility])     Patient Active Problem List   Diagnosis Date Noted  . Pressure injury of skin 09/01/2018  . Stroke ([place]) 08/31/2018  . Internal carotid artery stenosis, right 08/31/2018  . Hypotension 05/26/2018  . Right carotid artery occlusion 05/26/2018  . History of completed stroke 05/26/2018  . Obesity 05/26/2018  . TIA (transient ischemic attack) 05/23/2018  . Cerebrovascular accident (CVA) due to occlusion of right carotid artery ([place])   . Cerebral thrombosis with cerebral infarction 07/20/2016  . Non-traumatic rhabdomyolysis   . Bronchitis 07/15/2016  . Dementia ([place]) 07/15/2016  . CAP (community acquired pneumonia) 07/15/2016  . Acute left hemiparesis ([place]) 07/15/2016  . Abnormal urinalysis 07/15/2016  . Effusion of left knee 07/15/2016  . Elevated AST (SGOT) 07/15/2016  . Elevated troponin 07/15/2016  . Lobar pneumonia ([place])   . Prostate cancer ([place]) 10/21/2015  . Other seasonal allergic rhinitis 10/21/2015  .  Essential hypertension 01/16/2014  . Bipolar 1 disorder ([place]) 08/02/2013  . GERD (gastroesophageal reflux disease) 07/04/2013  . Elevated PSA, less than 10 ng/ml 07/04/2013  . BPH with elevated PSA 02/28/2013  . Encounter for Medicare annual wellness exam 02/28/2013  . Hyperlipidemia 02/28/2013    Past Surgical History:  Procedure Laterality Date  . IR ANGIO EXTERNAL CAROTID SEL EXT CAROTID UNI R MOD SED  07/20/2016  . IR ANGIO INTRA EXTRACRAN SEL INTERNAL CAROTID BILAT MOD SED  07/20/2016  . IR ANGIO VERTEBRAL SEL SUBCLAVIAN INNOMINATE UNI L MOD SED  07/20/2016  . IR ANGIOGRAM EXTREMITY RIGHT  07/20/2016  . RADIOLOGY WITH ANESTHESIA N/A 08/31/2018   Procedure: IR WITH ANESTHESIA;  Surgeon: Radiologist, Medication, MD;  Location: [place];  Service: Radiology;  Laterality: N/A;        Home Medications    Prior to Admission medications   Medication Sig Start Date End Date Taking? Authorizing Provider  atorvastatin (LIPITOR) 80 MG tablet Take 80 mg by mouth daily. 01/23/18  Yes [provider]  cloNIDine (CATAPRES) 0.1 MG tablet Take 0.1 mg by mouth 2 (two) times daily.  07/04/18  Yes [provider]  clopidogrel (PLAVIX) 75 MG tablet Take 1 tablet (75 mg total) by mouth daily. 07/22/16  Yes Jani Gravel, MD  lisinopril (PRINIVIL,ZESTRIL) 20 MG tablet Take 1 tablet (20 mg total) by mouth daily. Patient taking differently: Take 30 mg by mouth  daily.  05/26/18  Yes Donzetta Starch, NP  metoprolol tartrate (LOPRESSOR) 50 MG tablet Take 50 mg by mouth 2 (two) times daily.   Yes [provider]  Calcium-Vitamin D 600-200 MG-UNIT per tablet Take 1 tablet by mouth daily.    [provider]  fluticasone (FLONASE) 50 MCG/ACT nasal spray Place 1 spray into both nostrils daily.    [provider]    Family History Family History  Problem Relation Age of Onset  . Hypertension Father   . Hyperlipidemia Father   . Congestive Heart Failure Father 50        Deceased-1995  . Diabetes Mother   . Kidney failure Mother 32       Deceased-2005  . Hypertension Mother   . Congestive Heart Failure Maternal Grandmother   . Heart failure Paternal Grandmother   . Heart attack Paternal Grandmother     Social History Social History   Tobacco Use  . Smoking status: Never Smoker  . Smokeless tobacco: Never Used  Substance Use Topics  . Alcohol use: No  . Drug use: No     Allergies   Dust mite extract; Pollen extract; and Rye grass flower pollen extract [gramineae pollens]   Review of Systems Review of Systems  Unable to perform ROS: Acuity of condition  Constitutional: Positive for activity change.  Neurological: Positive for speech difficulty and weakness.     Physical Exam Updated Vital Signs BP (!) 119/48   Pulse 71   Temp 98.1 F (36.7 C) (Axillary)   Resp 16   Wt 97.5 kg   SpO2 100%   BMI 33.67 kg/m   Physical Exam Vitals signs and nursing note reviewed.  Constitutional:      Appearance: He is well-developed.  HENT:     Head: Atraumatic.  Neck:     Musculoskeletal: Neck supple.  Cardiovascular:     Rate and Rhythm: Normal rate.  Pulmonary:     Effort: Pulmonary effort is normal.  Skin:    General: Skin is warm.  Neurological:     Mental Status: He is alert and oriented to person, place, and time.     Cranial Nerves: Cranial nerve deficit present.     Sensory: Sensory deficit present.     Motor: Weakness present.     Comments: Left upper and lower extremity weakness, dysarthria      ED Treatments / Results  Labs (all labs ordered are listed, but only abnormal results are displayed) Labs Reviewed  CBC - Abnormal; Notable for the following components:      Result Value   Hemoglobin 12.6 (*)    All other components within normal limits  COMPREHENSIVE METABOLIC PANEL - Abnormal; Notable for the following components:   Glucose, Bld 132 (*)    All other components within normal limits  LIPID PANEL -  Abnormal; Notable for the following components:   HDL 30 (*)    All other components within normal limits  CBC WITH DIFFERENTIAL/PLATELET - Abnormal; Notable for the following components:   RBC 3.98 (*)    Hemoglobin 11.5 (*)    HCT 36.6 (*)    All other components within normal limits  BASIC METABOLIC PANEL - Abnormal; Notable for the following components:   Chloride 112 (*)    CO2 20 (*)    Glucose, Bld 124 (*)    Calcium 8.3 (*)    All other components within normal limits  BLOOD GAS, ARTERIAL - Abnormal; Notable for  the following components:   pO2, Arterial 374 (*)    Bicarbonate 19.7 (*)    Acid-base deficit 4.8 (*)    All other components within normal limits  GLUCOSE, CAPILLARY - Abnormal; Notable for the following components:   Glucose-Capillary 105 (*)    All other components within normal limits  GLUCOSE, CAPILLARY - Abnormal; Notable for the following components:   Glucose-Capillary 117 (*)    All other components within normal limits  GLUCOSE, CAPILLARY - Abnormal; Notable for the following components:   Glucose-Capillary 102 (*)    All other components within normal limits  GLUCOSE, CAPILLARY - Abnormal; Notable for the following components:   Glucose-Capillary 105 (*)    All other components within normal limits  I-STAT CHEM 8, ED - Abnormal; Notable for the following components:   Glucose, Bld 132 (*)    Calcium, Ion 1.13 (*)    All other components within normal limits  CBG MONITORING, ED - Abnormal; Notable for the following components:   Glucose-Capillary 123 (*)    All other components within normal limits  POCT I-STAT 7, (LYTES, BLD GAS, ICA,H+H) - Abnormal; Notable for the following components:   pO2, Arterial 422.0 (*)    HCT 35.0 (*)    Hemoglobin 11.9 (*)    All other components within normal limits  SARS CORONAVIRUS 2 (HOSPITAL ORDER, [facility])  MRSA PCR SCREENING  PROTIME-INR  APTT  DIFFERENTIAL  HEMOGLOBIN A1C   TRIGLYCERIDES  MAGNESIUM  PHOSPHORUS  GLUCOSE, CAPILLARY  GLUCOSE, CAPILLARY  RAPID URINE DRUG SCREEN, HOSP PERFORMED  CBC  TRIGLYCERIDES    EKG None  Radiology Ct Angio Head W Or Wo Contrast  Result Date: 08/31/2018 CLINICAL DATA:  73 year old male code stroke. 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 S/p IV tPA EXAM: CT ANGIOGRAPHY HEAD AND NECK TECHNIQUE: Multidetector CT imaging of the head and neck was performed using the standard protocol during bolus administration of intravenous contrast. Multiplanar CT image reconstructions and MIPs were obtained to evaluate the vascular anatomy. Carotid stenosis measurements (when applicable) are obtained utilizing NASCET criteria, using the distal internal carotid diameter as the denominator. CONTRAST:  24m OMNIPAQUE IOHEXOL 350 MG/ML SOLN COMPARISON:  Plain head CT today. Brain MRI 05/24/2018. CTA head and neck 05/23/2018, and earlier. FINDINGS: CTA NECK Skeleton: No acute osseous abnormality identified. Upper chest: Negative. Other neck: Stable, negative. Aortic arch: Moderate aortic atherosclerosis appears stable. Three vessel arch configuration. Right carotid system: Negative brachiocephalic artery and proximal CCA. Stable mild soft and calcified plaque at the right carotid bifurcation. Substantially improved enhancement of the cervical right ICA compared to the February CTA, although the vessel remains diminutive to the skull base. See intracranial findings below. Left carotid system: No left CCA origin stenosis despite plaque. Mild tortuosity of the left CCA. Stable soft plaque at the medial left ICA origin with less than 50 % stenosis with respect to the distal vessel. Vertebral arteries: Chronically occluded right vertebral artery from the origin distally. There is stable reconstitution at the distal right V2/V3 segment as before. The vessel remains faintly enhancing to the skull base as before. Proximal left subclavian artery and left  vertebral artery origin are stable without stenosis. The left vertebral artery appears somewhat dominant and remains patent to the skull base without stenosis. CTA HEAD Posterior circulation: The right V4 segment remains patent although is probably mostly supplied from the left. The right PICA origin remains patent. The dominant left vertebral artery supplies the  basilar. The left PICA origin remains patent. Patent basilar artery without stenosis. SCA and PCA origins are stable and patent. Left posterior communicating artery redemonstrated and patent. The right Posterior communicating arteries are diminutive or absent. Left PCA branches are stable and within normal limits. 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 The right P2 and distal branches are stable and within normal limits. Anterior circulation: There is faint enhancement of the right ICA siphon today which is highly irregular. As on the 2018 angiogram the siphon appears functionally occluded at the level of the ophthalmic artery ((series 5, image 51). Despite this, the right ICA terminus remains patent, and has a similar appearance to the February CTA. The right MCA and ACA origins remain patent. The right M1 segment, trifurcation, and right MCA branches appear stable. The ACA A1 segments are stable and patent. Diminutive or absent anterior communicating artery. There is new attenuated enhancement of the right ACA A2 on series 12, image 21, this segment was normal in February. There is preserved distal right ACA enhancement. Left ACA appears stable and within normal limits. Left MCA M1 and bifurcation are patent. Left MCA branches are stable. Venous sinuses: Early venous contrast phase but grossly patent. Anatomic variants: Dominant left vertebral artery. Review of the MIP images confirms the above findings IMPRESSION: 1. Improved cervical right ICA and right siphon since the CT in February, however there is persistent occlusion of the right ICA at  the ophthalmic artery origin. Stable right ICA terminus and right MCA branches 2. However, there is new stenosis of the Right ACA A2, at least moderate. 3. Otherwise stable intracranial CTA findings since February, including: - chronic right vertebral artery occlusion with faint reconstitution at the skull base. -severe right PCA P1 stenosis. 4. This study was reviewed in person Study discussed by telephone with Dr. Lorraine Lax 08/31/2018 at 1330 hours. Electronically Signed   By: Genevie Ann M.D.   On: 08/31/2018 13:47   Ct Angio Neck W Or Wo Contrast  Result Date: 08/31/2018 CLINICAL DATA:  73 year old male code stroke. 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 S/p IV tPA EXAM: CT ANGIOGRAPHY HEAD AND NECK TECHNIQUE: Multidetector CT imaging of the head and neck was performed using the standard protocol during bolus administration of intravenous contrast. Multiplanar CT image reconstructions and MIPs were obtained to evaluate the vascular anatomy. Carotid stenosis measurements (when applicable) are obtained utilizing NASCET criteria, using the distal internal carotid diameter as the denominator. CONTRAST:  59m OMNIPAQUE IOHEXOL 350 MG/ML SOLN COMPARISON:  Plain head CT today. Brain MRI 05/24/2018. CTA head and neck 05/23/2018, and earlier. FINDINGS: CTA NECK Skeleton: No acute osseous abnormality identified. Upper chest: Negative. Other neck: Stable, negative. Aortic arch: Moderate aortic atherosclerosis appears stable. Three vessel arch configuration. Right carotid system: Negative brachiocephalic artery and proximal CCA. Stable mild soft and calcified plaque at the right carotid bifurcation. Substantially improved enhancement of the cervical right ICA compared to the February CTA, although the vessel remains diminutive to the skull base. See intracranial findings below. Left carotid system: No left CCA origin stenosis despite plaque. Mild tortuosity of the left CCA. Stable soft plaque at the medial left ICA  origin with less than 50 % stenosis with respect to the distal vessel. Vertebral arteries: Chronically occluded right vertebral artery from the origin distally. There is stable reconstitution at the distal right V2/V3 segment as before. The vessel remains faintly enhancing to the skull base as before. Proximal left subclavian artery and left vertebral  artery origin are stable without stenosis. The left vertebral artery appears somewhat dominant and remains patent to the skull base without stenosis. CTA HEAD Posterior circulation: The right V4 segment remains patent although is probably mostly supplied from the left. The right PICA origin remains patent. The dominant left vertebral artery supplies the basilar. The left PICA origin remains patent. Patent basilar artery without stenosis. SCA and PCA origins are stable and patent. Left posterior communicating artery redemonstrated and patent. The right Posterior communicating arteries are diminutive or absent. Left PCA branches are stable and within normal limits. 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 The right P2 and distal branches are stable and within normal limits. Anterior circulation: There is faint enhancement of the right ICA siphon today which is highly irregular. As on the 2018 angiogram the siphon appears functionally occluded at the level of the ophthalmic artery ((series 5, image 51). Despite this, the right ICA terminus remains patent, and has a similar appearance to the February CTA. The right MCA and ACA origins remain patent. The right M1 segment, trifurcation, and right MCA branches appear stable. The ACA A1 segments are stable and patent. Diminutive or absent anterior communicating artery. There is new attenuated enhancement of the right ACA A2 on series 12, image 21, this segment was normal in February. There is preserved distal right ACA enhancement. Left ACA appears stable and within normal limits. Left MCA M1 and bifurcation are  patent. Left MCA branches are stable. Venous sinuses: Early venous contrast phase but grossly patent. Anatomic variants: Dominant left vertebral artery. Review of the MIP images confirms the above findings IMPRESSION: 1. 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 Stable right ICA terminus and right MCA branches 2. However, there is new stenosis of the Right ACA A2, at least moderate. 3. Otherwise stable intracranial CTA findings since February, including: - chronic right vertebral artery occlusion with faint reconstitution at the skull base. -severe right PCA P1 stenosis. 4. This study was reviewed in person Study discussed by telephone with Dr. Lorraine Lax 08/31/2018 at 1330 hours. Electronically Signed   By: Genevie Ann M.D.   On: 08/31/2018 13:47   Mr Virgel Paling ZO Contrast  Result Date: 09/01/2018 CLINICAL DATA:  73 year old male with code stroke presentation yesterday, history of chronic right ICA and vertebral artery clues *INSERT*, status post IV tPA and endovascular distal right ICA reperfusion yesterday. EXAM: MRI HEAD WITHOUT CONTRAST MRA HEAD WITHOUT CONTRAST TECHNIQUE: Multiplanar, multiecho pulse sequences of the brain and surrounding structures were obtained without intravenous contrast. Angiographic images of the head were obtained using MRA technique without contrast. COMPARISON:  08/31/2018 CTA CT P and CT head. Prior brain MRI 05/24/2018. FINDINGS: MRI HEAD FINDINGS Brain: Gyriform restricted diffusion throughout the lateral aspect of much of the right cerebral hemisphere from the temporal lobe toward the vertex and including the insula. Associated T2 and FLAIR hyperintensity. No evidence of hemorrhage on T2* imaging. No mass effect. 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 No deep gray matter nuclei, posterior fossa, or contralateral left hemisphere restricted  diffusion. No midline shift, mass effect, evidence of mass lesion, ventriculomegaly, extra-axial collection or acute intracranial hemorrhage. Cervicomedullary junction and pituitary are within normal limits. Stable gray and white matter signal outside of the right hemisphere. There are tiny chronic lacunar infarcts in the posterior right cerebellum. Vascular: The right ICA flow void is improved  in the visible neck and at the skull base compared to February. Stable other Major intracranial vascular flow voids. Skull and upper cervical spine: Negative visible cervical spine. Normal bone marrow signal. Sinuses/Orbits: Mild paranasal sinus fluid levels and mucosal thickening. Negative orbits. Other: Mastoids remain clear. Intubated. Small volume of fluid in the visible pharynx. MRA HEAD FINDINGS Antegrade flow in the distal left vertebral artery with patent left PICA origin. There is also faint flow signal in the right V4 segment and right PICA origin. Antegrade flow in the basilar artery without stenosis. Patent SCA and PCA origins. Left PCA branches are within normal limits but there is loss of flow signal in the right PCA beyond its origin. Normal left posterior communicating, the right posterior communicating artery is diminutive or absent. Antegrade flow in the left ICA siphon with no stenosis. Left ophthalmic and posterior communicating artery origins are normal. Left ICA terminus, MCA and ACA origins are normal. Diminutive or absent anterior communicating artery. Bilateral ACA branches appear within normal limits, including the right ACA A2 segment which appeared stenotic by CTA yesterday (series 404, image 11). Left MCA M1 and branches are within normal limits. There is antegrade flow signal in the right ICA. There is segmental loss of flow signal in the supraclinoid ICA is seen on series 404, image 15, but the right ICA terminus appears patent. The right MCA and ACA origins are patent. The right A1 is  diminutive. The right MCA M1, trifurcation, and visible right MCA branches are within normal limits. IMPRESSION: 1. Widespread gyriform restricted diffusion in the right hemisphere compatible with cortical ischemia. No associated hemorrhage or mass effect. No other areas of acute brain infarct. 2. Intracranial MRA reveals a diminutive but patent right ICA, although focal loss of flow signal in the supraclinoid segment raising the possibility of residual stenosis. 3. The visible right MCA and right ACA appear patent without stenosis - including the right A2 segment which was thought to be stenotic by CTA yesterday. 4. Poor flow in the right PCA, which demonstrated severe stenosis by CTA yesterday. There is some faint flow signal in the right vertebral artery V4 segment despite chronic right vertebral occlusion. Electronically Signed   By: Genevie Ann M.D.   On: 09/01/2018 15:49   Mr Brain Wo Contrast  Result Date: 09/01/2018 CLINICAL DATA:  73 year old male with code stroke presentation yesterday, history of chronic right ICA and vertebral artery clues *INSERT*, status post IV tPA and endovascular distal right ICA reperfusion yesterday. EXAM: MRI HEAD WITHOUT CONTRAST MRA HEAD WITHOUT CONTRAST TECHNIQUE: Multiplanar, multiecho pulse sequences of the brain and surrounding structures were obtained without intravenous contrast. Angiographic images of the head were obtained using MRA technique without contrast. COMPARISON:  08/31/2018 CTA CT P and CT head. Prior brain MRI 05/24/2018. FINDINGS: MRI HEAD FINDINGS Brain: Gyriform restricted diffusion throughout the lateral aspect of much of the right cerebral hemisphere from the temporal lobe toward the vertex and including the insula. Associated T2 and FLAIR hyperintensity. No evidence of hemorrhage on T2* imaging. No mass effect. 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 No deep gray matter nuclei, posterior fossa, or contralateral left hemisphere  restricted diffusion. No midline shift, mass effect, evidence of mass lesion, ventriculomegaly, extra-axial collection or acute intracranial hemorrhage. Cervicomedullary junction and pituitary are within normal limits. Stable gray and white matter signal outside of the right hemisphere. There are tiny chronic lacunar infarcts in the posterior right cerebellum. Vascular: The right ICA flow void is improved in  the visible neck and at the skull base compared to February. Stable other Major intracranial vascular flow voids. Skull and upper cervical spine: Negative visible cervical spine. Normal bone marrow signal. Sinuses/Orbits: Mild paranasal sinus fluid levels and mucosal thickening. Negative orbits. Other: Mastoids remain clear. Intubated. Small volume of fluid in the visible pharynx. MRA HEAD FINDINGS Antegrade flow in the distal left vertebral artery with patent left PICA origin. There is also faint flow signal in the right V4 segment and right PICA origin. Antegrade flow in the basilar artery without stenosis. Patent SCA and PCA origins. Left PCA branches are within normal limits but there is loss of flow signal in the right PCA beyond its origin. Normal left posterior communicating, the right posterior communicating artery is diminutive or absent. Antegrade flow in the left ICA siphon with no stenosis. Left ophthalmic and posterior communicating artery origins are normal. Left ICA terminus, MCA and ACA origins are normal. Diminutive or absent anterior communicating artery. Bilateral ACA branches appear within normal limits, including the right ACA A2 segment which appeared stenotic by CTA yesterday (series 404, image 11). Left MCA M1 and branches are within normal limits. There is antegrade flow signal in the right ICA. There is segmental loss of flow signal in the supraclinoid ICA is seen on series 404, image 15, but the right ICA terminus appears patent. The right MCA and ACA origins are patent. The right A1  is diminutive. The right MCA M1, trifurcation, and visible right MCA branches are within normal limits. IMPRESSION: 1. Widespread gyriform restricted diffusion in the right hemisphere compatible with cortical ischemia. No associated hemorrhage or mass effect. No other areas of acute brain infarct. 2. Intracranial MRA reveals a diminutive but patent right ICA, although focal loss of flow signal in the supraclinoid segment raising the possibility of residual stenosis. 3. The visible right MCA and right ACA appear patent without stenosis - including the right A2 segment which was thought to be stenotic by CTA yesterday. 4. Poor flow in the right PCA, which demonstrated severe stenosis by CTA yesterday. There is some faint flow signal in the right vertebral artery V4 segment despite chronic right vertebral occlusion. Electronically Signed   By: Genevie Ann M.D.   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 Presents today with right hemisphere ischemia symptoms. EXAM: CT PERFUSION BRAIN TECHNIQUE: Multiphase CT imaging of the brain was performed following IV bolus contrast injection. Subsequent parametric perfusion maps were calculated using RAPID software. CONTRAST:  50 milliliters Omnipaque 350. COMPARISON:  CTA head and neck earlier today. CT perfusion and CTA 05/23/2018. FINDINGS: CT Brain Perfusion Findings: CBF (<30%) Volume: Zero. And there is also no less than 38% reduction in CBF (versus previously 12 mL in February) Perfusion (Tmax>6.0s) volume: 333m Versus 335 mL in February, but visually the right hemisphere distribution of prolonged T-max is unchanged. Hypoperfusion index of 0.4 (a favorable decrease from 0.6 in February). Mismatch Volume: 3588mASPECTS on noncontrast CT Head: 10 at 1303 hours today. Infarction  Location:Not applicable IMPRESSION: Largely unchanged CT perfusion abnormality in the right hemisphere since the February CTP, with actually slightly improved right hemisphere perfusion parameters in terms of CBF and the hypoperfusion index since that time. Electronically Signed   By: H Genevie Ann.D.   On: 08/31/2018 14:49   Dg  Chest Port 1 View  Result Date: 09/01/2018 CLINICAL DATA:  Endotracheal tube placement. EXAM: PORTABLE CHEST 1 VIEW COMPARISON:  Radiograph of August 31, 2018. FINDINGS: The heart size and mediastinal contours are within normal limits. Endotracheal tube is in grossly good position. Nasogastric tube has been significant withdrawn, with distal tip and proximal esophagus. No pneumothorax or pleural effusion is noted. Left lung is clear. Mild right basilar subsegmental atelectasis is noted. The visualized skeletal structures are unremarkable. IMPRESSION: Endotracheal tube in grossly good position. Nasogastric tube has been partially withdrawn, with distal tip in expected position of proximal thoracic esophagus; advancement is recommended. Mild right basilar subsegmental atelectasis. Electronically Signed   By: Marijo Conception M.D.   On: 09/01/2018 08:24   Dg Chest Port 1 View  Result Date: 08/31/2018 CLINICAL DATA:  Intubation. EXAM: PORTABLE CHEST 1 VIEW COMPARISON:  05/23/2018 FINDINGS: The endotracheal tube is 2.5 cm above the carina. The NG tube is coursing down the esophagus. The tip is in the fundus near the GE junction in the proximal port is in the distal esophagus. This could be advanced several cm. The heart is normal in size given the AP projection and supine position of the patient. The lungs are clear. No pleural effusion or pneumothorax. The bony thorax is intact. IMPRESSION: 1. The endotracheal tube is in good position, 2.5 cm above the carina. 2. The NG tube could be advanced several cm. 3. No acute cardiopulmonary findings. Electronically Signed   By: Marijo Sanes M.D.   On:  08/31/2018 19:12   Ct Head Code Stroke Wo Contrast  Result Date: 08/31/2018 CLINICAL DATA:  Code stroke. 73 year old male. 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 EXAM: CT HEAD WITHOUT CONTRAST TECHNIQUE: Contiguous axial images were obtained from the base of the skull through the vertex without intravenous contrast. COMPARISON:  Brain MRI 05/24/2018 and earlier. FINDINGS: Brain: Chronic right parietal lobe encephalomalacia. No acute intracranial hemorrhage identified. No midline shift, mass effect, or evidence of intracranial mass lesion. No cortically based acute infarct identified. Stable gray-white matter differentiation throughout the brain. No ventriculomegaly. Vascular: Mild Calcified atherosclerosis at the skull base. No suspicious intracranial vascular hyperdensity. Skull: No acute osseous abnormality identified. Sinuses/Orbits: Hyperplastic sinuses. Visualized paranasal sinuses and mastoids are stable and well pneumatized. Other: Rightward gaze deviation. No acute scalp soft tissue findings. ASPECTS ([place] Stroke Program Early CT Score) - Ganglionic level infarction (caudate, lentiform nuclei, internal capsule, insula, M1-M3 cortex): 7 - Supraganglionic infarction (M4-M6 cortex): 3 Total score (0-10 with 10 being normal): 10 (chronic right parietal lobe encephalomalacia) IMPRESSION: 1. Stable non contrast CT appearance of the brain since February. Chronic right parietal lobe encephalomalacia. ASPECTS is 10. 2. These results were communicated to Dr. Lorraine Lax at 1:11 pm on 08/31/2018 by text page via the [facility] messaging system. Electronically Signed   By: Genevie Ann M.D.   On: 08/31/2018 13:11    Procedures .Critical Care Performed by: Varney Biles, MD Authorized by: Varney Biles, MD   Critical care provider statement:    Critical care time (minutes):  36   Critical care was necessary to treat or prevent imminent or life-threatening deterioration of the following conditions:  CNS  failure or compromise   Critical care was time spent personally by me on the following activities:  Discussions with consultants, evaluation of patient's response to treatment, examination of patient, ordering and performing treatments and interventions, ordering and review of laboratory studies, ordering and review of radiographic studies, pulse oximetry, re-evaluation of patient's  condition, obtaining history from patient or surrogate and review of old charts   (including critical care time)  Medications Ordered in ED Medications  0.9 %  sodium chloride infusion ( Intravenous Rate/Dose Verify 09/01/18 0900)  acetaminophen (TYLENOL) tablet 650 mg (has no administration in time range)    Or  acetaminophen (TYLENOL) solution 650 mg (has no administration in time range)    Or  acetaminophen (TYLENOL) suppository 650 mg (has no administration in time range)  senna-docusate (Senokot-S) tablet 1 tablet (has no administration in time range)  pantoprazole (PROTONIX) injection 40 mg (40 mg Intravenous Given 08/31/18 2142)  nicardipine (CARDENE) '20mg'$  in 0.86% saline 222m IV infusion (0.1 mg/ml) (has no administration in time range)  ceFAZolin (ANCEF) 2-4 GM/100ML-% IVPB (has no administration in time range)  nitroGLYCERIN 100 mcg/mL intra-arterial injection (has no administration in time range)  ticagrelor (BRILINTA) 90 MG tablet (has no administration in time range)  eptifibatide (INTEGRILIN) 20 MG/10ML injection (has no administration in time range)  aspirin 81 MG chewable tablet (has no administration in time range)  clevidipine (CLEVIPREX) 0.5 MG/ML infusion (has no administration in time range)  0.9 %  sodium chloride infusion ( Intravenous Rate/Dose Change 09/01/18 0552)  clevidipine (CLEVIPREX) infusion 0.5 mg/mL (21 mg/hr Intravenous New Bag/Given 09/01/18 1318)  aspirin tablet 325 mg ( Oral See Alternative 09/01/18 1645)    Or  aspirin chewable tablet 324 mg (324 mg Per Tube Given 09/01/18 1645)   clopidogrel (PLAVIX) tablet 75 mg ( Oral See Alternative 09/01/18 1645)    Or  clopidogrel (PLAVIX) tablet 75 mg (75 mg Per Tube Given 09/01/18 1645)  iohexol (OMNIPAQUE) 300 MG/ML solution 150 mL (has no administration in time range)  insulin aspart (novoLOG) injection 1-3 Units (1 Units Subcutaneous Not Given 09/01/18 1644)  fentaNYL (SUBLIMAZE) injection 25 mcg (has no administration in time range)  fentaNYL (SUBLIMAZE) injection 25-100 mcg (100 mcg Intravenous Given 09/01/18 0714)  propofol (DIPRIVAN) 1000 MG/100ML infusion (50 mcg/kg/min  97.5 kg Intravenous New Bag/Given 09/01/18 1646)  chlorhexidine gluconate (MEDLINE KIT) (PERIDEX) 0.12 % solution 15 mL (15 mLs Mouth Rinse Given 09/01/18 0800)  MEDLINE mouth rinse (15 mLs Mouth Rinse Given 09/01/18 1711)  atorvastatin (LIPITOR) tablet 80 mg (80 mg Per Tube Given 09/01/18 1037)  alteplase (ACTIVASE) 1 mg/mL infusion 87.8 mg (0 mg Intravenous Stopped 08/31/18 1420)    Followed by  0.9 %  sodium chloride infusion (50 mLs Intravenous New Bag/Given 08/31/18 1410)  iohexol (OMNIPAQUE) 350 MG/ML injection 75 mL (75 mLs Intravenous Contrast Given 08/31/18 1314)   stroke: mapping our early stages of recovery book ( Does not apply Given 08/31/18 1856)  ticagrelor (BRILINTA) tablet (180 mg Per NG tube Given 08/31/18 1525)  aspirin chewable tablet (81 mg Per NG tube Given 08/31/18 1525)  eptifibatide (INTEGRILIN) injection (1.5 mg Other Given 08/31/18 1703)  nitroGLYCERIN 25 mcg in sodium chloride (PF) 0.9 % 59.71 mL (25 mcg/mL) syringe (25 mcg Intra-arterial Given 08/31/18 1700)     Initial Impression / Assessment and Plan / ED Course  I have reviewed the triage vital signs and the nursing notes.  Pertinent labs & imaging results that were available during my care of the patient were reviewed by me and considered in my medical decision making (see chart for details).        73year old male comes in a chief complaint of acute left-sided weakness.  He also has  dysarthria.  He has history of known right ICA occlusion and left-sided deficits.  However it appears that the current deficits are new.  Stroke team was ready to assess the patient immediately upon his arrival to the ER and decision was made to give TPA.  Patient had no complications in the ER, in fact his symptoms had gradually improved after the bolus of TPA was completed.  Final Clinical Impressions(s) / ED Diagnoses   Final diagnoses:  Acute ischemic stroke [facility])    ED Discharge Orders    None       Varney Biles, MD 09/01/18 1736

## 2018-09-01 NOTE — Progress Notes (Signed)
  Echocardiogram 2D Echocardiogram has been performed.  Ronald [name] 09/01/2018, 10:34 AM

## 2018-09-01 NOTE — Progress Notes (Signed)
SLP Cancellation Note  Patient Details Name: Ronald [name] MRN: [id number] DOB: 10-Oct-1945   Cancelled treatment:       Reason Eval/Treat Not Completed: Medical issues which prohibited therapy;Patient not medically ready(Pt is currently intubated. SLP will follow up. )  Gordie Crumby I. Hardin Negus, [place], [place] Office number [phone] Pager [place] 09/01/2018, 8:36 AM

## 2018-09-02 DIAGNOSIS — I6521 Occlusion and stenosis of right carotid artery: Secondary | ICD-10-CM

## 2018-09-02 DIAGNOSIS — J9600 Acute respiratory failure, unspecified whether with hypoxia or hypercapnia: Secondary | ICD-10-CM

## 2018-09-02 LAB — GLUCOSE, CAPILLARY
Glucose-Capillary: 103 mg/dL — ABNORMAL HIGH (ref 70–99)
Glucose-Capillary: 105 mg/dL — ABNORMAL HIGH (ref 70–99)
Glucose-Capillary: 121 mg/dL — ABNORMAL HIGH (ref 70–99)
Glucose-Capillary: 74 mg/dL (ref 70–99)
Glucose-Capillary: 86 mg/dL (ref 70–99)
Glucose-Capillary: 99 mg/dL (ref 70–99)

## 2018-09-02 LAB — TRIGLYCERIDES: Triglycerides: 112 mg/dL (ref <150)

## 2018-09-02 MED ORDER — CHLORHEXIDINE GLUCONATE CLOTH 2 % EX PADS
6.0000 | MEDICATED_PAD | Freq: Every day | CUTANEOUS | Status: DC
  Administered 2018-09-02 – 2018-09-08 (×6): 6 via TOPICAL

## 2018-09-02 MED ORDER — LABETALOL HCL 5 MG/ML IV SOLN
20.0000 mg | INTRAVENOUS | Status: DC | PRN
  Administered 2018-09-03: 20 mg via INTRAVENOUS
  Filled 2018-09-02: qty 4

## 2018-09-02 MED ORDER — HYDRALAZINE HCL 20 MG/ML IJ SOLN
10.0000 mg | Freq: Four times a day (QID) | INTRAMUSCULAR | Status: DC | PRN
  Administered 2018-09-03: 20 mg via INTRAVENOUS
  Filled 2018-09-02 (×2): qty 1

## 2018-09-02 NOTE — Evaluation (Signed)
Occupational Therapy Evaluation Patient Details Name: Bennett Ram MRN: [id number] DOB: 23-Aug-1945 Today's Date: 09/02/2018    History of Present Illness 73 yo male admitted after sudden onset lt sided weakness. Pt with rt MCA infarct and underwent TPA and angioplasty to rt ICA on 6/4. Intubated 6/4-6/6.  PMH: dementia, depression, hypertension, hyperlipidemia, schizophrenia, stroke involving R carotid watershed areas with residual left sided weakness   Clinical Impression   PTA, pt was planning on moving to ALF and was performing BADLs and using RW/cane for mobility. Pt currently required Mod A for UB ADLs, Max A for LB ADLs, and Min-Mod A for functional transfers with RW. Pt presenting with poor cognition, functional use of LUE, balance, strength, vision, and safety. Pt would benefit from further acute OT to facilitate safe dc. Recommend dc to SNF for further OT to optimize safety, independence with ADLs, and return to PLOF.      Follow Up Recommendations  SNF;Supervision/Assistance - 24 hour    Equipment Recommendations  Other (comment)(Defer to next venue)    Recommendations for Other Services PT consult;Speech consult     Precautions / Restrictions Precautions Precautions: Fall Restrictions Weight Bearing Restrictions: No      Mobility Bed Mobility Overal bed mobility: Needs Assistance Bed Mobility: Supine to Sit     Supine to sit: Mod assist     General bed mobility comments: Mod A for managing BLEs over EOB  Transfers Overall transfer level: Needs assistance Equipment used: Rolling walker (2 wheeled) Transfers: Sit to/from [facility] Sit to Stand: Min assist Stand pivot transfers: Mod assist       General transfer comment: Mod A for managing RW during stand pivot    Balance Overall balance assessment: Needs assistance Sitting-balance support: No upper extremity supported;Feet supported Sitting balance-Leahy Scale: Fair     Standing  balance support: Single extremity supported Standing balance-Leahy Scale: Poor Standing balance comment: walker and min guard for static standing                           ADL either performed or assessed with clinical judgement   ADL Overall ADL's : Needs assistance/impaired Eating/Feeding: NPO   Grooming: Oral care;Moderate assistance;Sitting(applying lotion to hands) Grooming Details (indicate cue type and reason): Mod A for incorporating left hand into task. Max hand over hand for left hadn to maintain grasp on mouth wash or lotion bottle. Providing support at wrist and elbow to bring left hand to midline for applying lotion.  Upper Body Bathing: Moderate assistance;Sitting   Lower Body Bathing: Maximal assistance;Sit to/from stand   Upper Body Dressing : Moderate assistance;Sitting   Lower Body Dressing: Maximal assistance;Sit to/from stand   Toilet Transfer: Moderate assistance;Ambulation;RW(simulated in room; short distance) Toilet Transfer Details (indicate cue type and reason): Mod a for balance and support; Mod A for RW management. cues for safety and sequencing         Functional mobility during ADLs: Moderate assistance;Rolling walker(short distance ) General ADL Comments: Pt presenting with decreased strength, balance, cognition, vision, and activity tolerance     Vision Baseline Vision/History: Wears glasses Wears Glasses: Reading only Patient Visual Report: Other (comment)(Denies changes; not left inattention) Vision Assessment?: Yes;Vision impaired- to be further tested in functional context Eye Alignment: Within Functional Limits Ocular Range of Motion: Impaired-to be further tested in functional context Alignment/Gaze Preference: Other (comment)(preference for looking right over other directions) Tracking/Visual Pursuits: Impaired - to be further tested in functional  context Visual Fields: Impaired-to be further tested in functional  context Additional Comments: Pt with preference for looking right. requiring cues to look left.      Perception     Praxis      Pertinent Vitals/Pain Pain Assessment: No/denies pain     Hand Dominance Right   Extremity/Trunk Assessment Upper Extremity Assessment Upper Extremity Assessment: LUE deficits/detail LUE Deficits / Details: Poor ROM and strength. Able to perform composite flexion of left digits with a compensatory extension of wrist. No extension of digits. LUE Coordination: decreased fine motor;decreased gross motor   Lower Extremity Assessment Lower Extremity Assessment: Defer to PT evaluation       Communication Communication Communication: Expressive difficulties(dysarthric)   Cognition Arousal/Alertness: Awake/alert Behavior During Therapy: WFL for tasks assessed/performed Overall Cognitive Status: Impaired/Different from baseline Area of Impairment: Attention;Memory;Following commands;Safety/judgement;Problem solving;Awareness                   Current Attention Level: Sustained Memory: Decreased short-term memory;Decreased recall of precautions Following Commands: Follows one step commands inconsistently Safety/Judgement: Decreased awareness of safety;Decreased awareness of deficits Awareness: Intellectual Problem Solving: Slow processing;Requires verbal cues;Requires tactile cues General Comments: Perseverating on certain topics. Requiring increased cues for attention. Left inattention.    General Comments  VSS throughout on 2L O2    Exercises     Shoulder Instructions      Home Living Family/patient expects to be discharged to:: Assisted living(Pt was scheduled to move to ALF on day of CVA)                             Home Equipment: Walker - 2 wheels;Cane - single point;Wheelchair - manual;Tub bench          Prior Functioning/Environment Level of Independence: Independent with assistive device(s)        Comments: Used  cane on unlevel surfaces        OT Problem List: Decreased strength;Decreased range of motion;Decreased activity tolerance;Impaired balance (sitting and/or standing);Decreased knowledge of use of DME or AE;Decreased knowledge of precautions;Decreased cognition;Decreased coordination;Impaired UE functional use;Pain      OT Treatment/Interventions: Self-care/ADL training;Therapeutic exercise;Energy conservation;DME and/or AE instruction;Therapeutic activities;Patient/family education    OT Goals(Current goals can be found in the care plan section) Acute Rehab OT Goals Patient Stated Goal: go home OT Goal Formulation: With patient Time For Goal Achievement: 09/16/18 Potential to Achieve Goals: Good  OT Frequency: Min 2X/week   Barriers to D/C:            Co-evaluation              AM-PAC OT "6 Clicks" Daily Activity     Outcome Measure Help from another person eating meals?: A Lot Help from another person taking care of personal grooming?: A Lot Help from another person toileting, which includes using toliet, bedpan, or urinal?: A Lot Help from another person bathing (including washing, rinsing, drying)?: A Lot Help from another person to put on and taking off regular upper body clothing?: A Lot Help from another person to put on and taking off regular lower body clothing?: A Lot 6 Click Score: 12   End of Session Equipment Utilized During Treatment: Gait belt;Oxygen;Rolling walker Nurse Communication: Mobility status  Activity Tolerance: Patient tolerated treatment well Patient left: in bed;with call bell/phone within reach  OT Visit Diagnosis: Unsteadiness on feet (R26.81);Other abnormalities of gait and mobility (R26.89);Muscle weakness (generalized) (M62.81);Other symptoms and signs involving cognitive  function;Hemiplegia and hemiparesis Hemiplegia - Right/Left: Left Hemiplegia - dominant/non-dominant: Non-Dominant Hemiplegia - caused by: Cerebral infarction                 Time: 5573-2202 OT Time Calculation (min): 34 min Charges:  OT General Charges $OT Visit: 1 Visit OT Evaluation $OT Eval Moderate Complexity: 1 Mod OT Treatments $Self Care/Home Management : 8-22 mins  Madylyn Insco MSOT, OTR/L Acute Rehab Pager: [phone] Office: [place] 09/02/2018, 5:02 PM

## 2018-09-02 NOTE — Evaluation (Signed)
Clinical/Bedside Swallow Evaluation Patient Details  Name: Ronald [name] MRN: [id number] Date of Birth: Jan 19, 1946  Today's Date: 09/02/2018 Time: SLP Start Time (ACUTE ONLY): 1455 SLP Stop Time (ACUTE ONLY): 1522 SLP Time Calculation (min) (ACUTE ONLY): 27 min  Past Medical History:  Past Medical History:  Diagnosis Date  . Cancer [facility])    Prostate  . Chicken pox   . Dementia ([place])   . Depression   . Heart disease   . Hyperlipemia   . Hypertension   . Measles   . Mumps   . 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 Stroke [facility])    Past Surgical History:  Past Surgical History:  Procedure Laterality Date  . IR ANGIO EXTERNAL CAROTID SEL EXT CAROTID UNI R MOD SED  07/20/2016  . IR ANGIO INTRA EXTRACRAN SEL INTERNAL CAROTID BILAT MOD SED  07/20/2016  . IR ANGIO VERTEBRAL SEL SUBCLAVIAN INNOMINATE UNI L MOD SED  07/20/2016  . IR ANGIOGRAM EXTREMITY RIGHT  07/20/2016  . RADIOLOGY WITH ANESTHESIA N/A 08/31/2018   Procedure: IR WITH ANESTHESIA;  Surgeon: Radiologist, Medication, MD;  Location: [place];  Service: Radiology;  Laterality: N/A;   HPI:  Pt is a 73 y.o. male with history of stroke, schizophrenia, measles, hypertension, hyperlipidemia, depression, dementia. Patient was found at his home sitting in a chair when he suddenly slumped over to the left and was noted to be flaccid on the left with right gaze deviation along with dysarthric. CT of the head revealed stable since February and chronic right parietal lobe encephalomalacia. MRI on 09/01/18 revealed Widespread gyriform restricted diffusion in the right hemisphere compatible with cortical ischemia. No associated hemorrhage or mass effect. No other areas of acute brain infarct; pt intubated 08/31/18-09/02/18; BSE generated to assess swallowing function post-extubation.  Assessment / Plan / Recommendation Clinical Impression   Pt presents with oropharyngeal dysphagia characterized by anterior loss on left, decreased sensory  awareness on left/pocketing of solids, delay in the initiation of the swallow with potential for silent aspiration as pt exhibited multiple swallows with thin, immediate cough with thin with larger volumes and delayed cough with puree/nectar-thickened liquids; pt is distractible/impulsive which also increases his aspiration risk at this time; hoarse vocal quality with low intensity noted post-extubation; Recommend MBS to r/o silent aspiration and determine safest diet; initiate nectar-thickened liquids and Dysphagia 1; ST will f/u for objective testing and diet tolerance; pt demonstrated moderate dysarthria during BSE and impairments with memory/attention and awareness; formal SLE pending. SLP Visit Diagnosis: Dysphagia, oropharyngeal phase (R13.12)    Aspiration Risk  Mild-moderate aspiration risk    Diet Recommendation   Dysphagia 1/nectar-thickened liquids  Medication Administration: Crushed with puree    Other  Recommendations Oral Care Recommendations: Oral care BID Other Recommendations: Order thickener from pharmacy   Follow up Recommendations Skilled Nursing facility;Inpatient Rehab      Frequency and Duration min 2x/week  1 week       Prognosis Prognosis for Safe Diet Advancement: Good Barriers to Reach Goals: Cognitive deficits      Swallow Study   General Date of Onset: 08/31/18 HPI: Pt is a 73 y.o. male with history of stroke, schizophrenia, measles, hypertension, hyperlipidemia, depression, dementia. Patient was found at his home sitting in a chair when he suddenly slumped over to the left and was noted to be flaccid on the left with right gaze deviation along with dysarthric. CT of the head revealed stable since February and chronic right parietal lobe encephalomalacia. MRI  of the brain has not been completed as yet.  Type of Study: Bedside Swallow Evaluation Previous Swallow Assessment: n/a Diet Prior to this Study: NPO Temperature Spikes Noted: Yes(low  grade) Respiratory Status: Nasal cannula History of Recent Intubation: Yes Length of Intubations (days): (2) Date extubated: 09/02/18 Behavior/Cognition: Alert;Cooperative;Impulsive;Distractible Oral Cavity Assessment: Excessive secretions Oral Care Completed by SLP: Recent completion by staff Oral Cavity - Dentition: Missing dentition;Poor condition Vision: Functional for self-feeding Self-Feeding Abilities: Able to feed self;Needs assist;Other (Comment)(d/t impulsivity) Patient Positioning: Upright in chair Baseline Vocal Quality: Hoarse;Low vocal intensity Volitional Cough: Strong;Wet Volitional Swallow: Able to elicit    Oral/Motor/Sensory Function Overall Oral Motor/Sensory Function: Moderate impairment Facial ROM: Reduced left Facial Symmetry: Abnormal symmetry left Facial Strength: Reduced left Facial Sensation: Reduced left Lingual ROM: Reduced left Lingual Symmetry: Abnormal symmetry left Lingual Strength: Reduced Lingual Sensation: Reduced   Ice Chips Ice chips: Impaired Presentation: Spoon Pharyngeal Phase Impairments: Suspected delayed Swallow;Multiple swallows   Thin Liquid Thin Liquid: Impaired Presentation: Cup;Spoon;Straw Oral Phase Impairments: Reduced labial seal Oral Phase Functional Implications: Left lateral sulci pocketing;Left anterior spillage Pharyngeal  Phase Impairments: Suspected delayed Swallow;Multiple swallows;Cough - Immediate;Cough - Delayed    Nectar Thick Nectar Thick Liquid: Impaired Presentation: Cup Oral Phase Impairments: Reduced labial seal Oral phase functional implications: Left anterior spillage Pharyngeal Phase Impairments: Suspected delayed Swallow   Honey Thick Honey Thick Liquid: Not tested   Puree Puree: Impaired Presentation: Self Fed Oral Phase Functional Implications: Left lateral sulci pocketing Pharyngeal Phase Impairments: Suspected delayed Swallow;Multiple swallows   Solid     Solid: Impaired Presentation: Self  Fed Oral Phase Impairments: Reduced lingual movement/coordination Oral Phase Functional Implications: Left lateral sulci pocketing;Impaired mastication;Oral residue Pharyngeal Phase Impairments: Suspected delayed Swallow;Multiple swallows      Elvina Sidle, M.S., CCC-SLP 09/02/2018,3:46 PM

## 2018-09-02 NOTE — Progress Notes (Signed)
Referring Physician(s): Dr Leonie Man  Supervising Physician: Luanne Bras  Patient Status:  [name] - In-pt  Chief Complaint:  CVA 6/4 procedure:  S/P bilateral common carotid arteriograms,followed by balloon angioplasty of occluded supraclinoid RT ICA with RT MCA TICI 3 revascularization  Subjective:  Extubated this am Moving all 4s--  Using suction tube well Trying to communicate-- some words are distinguishable    Allergies: Dust mite extract; Pollen extract; and Rye grass flower pollen extract [gramineae pollens]  Medications: Prior to Admission medications   Medication Sig Start Date End Date Taking? Authorizing Provider  atorvastatin (LIPITOR) 80 MG tablet Take 80 mg by mouth daily. 01/23/18  Yes [provider]  cloNIDine (CATAPRES) 0.1 MG tablet Take 0.1 mg by mouth 2 (two) times daily.  07/04/18  Yes [provider]  clopidogrel (PLAVIX) 75 MG tablet Take 1 tablet (75 mg total) by mouth daily. 07/22/16  Yes Jani Gravel, MD  lisinopril (PRINIVIL,ZESTRIL) 20 MG tablet Take 1 tablet (20 mg total) by mouth daily. Patient taking differently: Take 30 mg by mouth daily.  05/26/18  Yes Donzetta Starch, NP  metoprolol tartrate (LOPRESSOR) 50 MG tablet Take 50 mg by mouth 2 (two) times daily.   Yes [provider]  Calcium-Vitamin D 600-200 MG-UNIT per tablet Take 1 tablet by mouth daily.    [provider]  fluticasone (FLONASE) 50 MCG/ACT nasal spray Place 1 spray into both nostrils daily.    [provider]     Vital Signs: BP (!) 144/71   Pulse 88   Temp 99 F (37.2 C) (Axillary)   Resp 20   Wt 214 lb 15.2 oz (97.5 kg)   SpO2 99%   BMI 33.67 kg/m   Physical Exam Vitals signs reviewed.  Constitutional:      Comments: Gaze to right   Eyes:     Comments: Tries to follow me-- not focusing well-- but does follow finder to right- to left is slow and loses focus  Pulmonary:     Effort: Pulmonary effort is normal.   Abdominal:     Palpations: Abdomen is soft.  Musculoskeletal:     Comments: Moving all 4s- not to command Can squeeze both hands when I give him my hands-- left is weaker  Right groin is NT no hematoma Right foot 2+ pulses  Skin:    General: Skin is warm and dry.  Neurological:     Mental Status: He is alert.     Imaging: Ct Angio Head W Or Wo Contrast  Result Date: 08/31/2018 CLINICAL DATA:  73 year old male code stroke. 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 S/p IV tPA EXAM: CT ANGIOGRAPHY HEAD AND NECK TECHNIQUE: Multidetector CT imaging of the head and neck was performed using the standard protocol during bolus administration of intravenous contrast. Multiplanar CT image reconstructions and MIPs were obtained to evaluate the vascular anatomy. Carotid stenosis measurements (when applicable) are obtained utilizing NASCET criteria, using the distal internal carotid diameter as the denominator. CONTRAST:  84mL OMNIPAQUE IOHEXOL 350 MG/ML SOLN COMPARISON:  Plain head CT today. Brain MRI 05/24/2018. CTA head and neck 05/23/2018, and earlier. FINDINGS: CTA NECK Skeleton: No acute osseous abnormality identified. Upper chest: Negative. Other neck: Stable, negative. Aortic arch: Moderate aortic atherosclerosis appears stable. Three vessel arch configuration. Right carotid system: Negative brachiocephalic artery and proximal CCA. Stable mild soft and calcified plaque at the right carotid bifurcation. Substantially improved enhancement of the cervical right ICA compared to the February CTA,  although the vessel remains diminutive to the skull base. See intracranial findings below. Left carotid system: No left CCA origin stenosis despite plaque. Mild tortuosity of the left CCA. Stable soft plaque at the medial left ICA origin with less than 50 % stenosis with respect to the distal vessel. Vertebral arteries: Chronically occluded right vertebral artery from the origin distally. There is stable  reconstitution at the distal right V2/V3 segment as before. The vessel remains faintly enhancing to the skull base as before. Proximal left subclavian artery and left vertebral artery origin are stable without stenosis. The left vertebral artery appears somewhat dominant and remains patent to the skull base without stenosis. CTA HEAD Posterior circulation: The right V4 segment remains patent although is probably mostly supplied from the left. The right PICA origin remains patent. The dominant left vertebral artery supplies the basilar. The left PICA origin remains patent. Patent basilar artery without stenosis. SCA and PCA origins are stable and patent. Left posterior communicating artery redemonstrated and patent. The right Posterior communicating arteries are diminutive or absent. Left PCA branches are stable and within normal limits. 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 The right P2 and distal branches are stable and within normal limits. Anterior circulation: There is faint enhancement of the right ICA siphon today which is highly irregular. As on the 2018 angiogram the siphon appears functionally occluded at the level of the ophthalmic artery ((series 5, image 51). Despite this, the right ICA terminus remains patent, and has a similar appearance to the February CTA. The right MCA and ACA origins remain patent. The right M1 segment, trifurcation, and right MCA branches appear stable. The ACA A1 segments are stable and patent. Diminutive or absent anterior communicating artery. There is new attenuated enhancement of the right ACA A2 on series 12, image 21, this segment was normal in February. There is preserved distal right ACA enhancement. Left ACA appears stable and within normal limits. Left MCA M1 and bifurcation are patent. Left MCA branches are stable. Venous sinuses: Early venous contrast phase but grossly patent. Anatomic variants: Dominant left vertebral artery. Review of the MIP images  confirms the above findings IMPRESSION: 1. 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 Stable right ICA terminus and right MCA branches 2. However, there is new stenosis of the Right ACA A2, at least moderate. 3. Otherwise stable intracranial CTA findings since February, including: - chronic right vertebral artery occlusion with faint reconstitution at the skull base. -severe right PCA P1 stenosis. 4. This study was reviewed in person Study discussed by telephone with Dr. Lorraine Lax 08/31/2018 at 1330 hours. Electronically Signed   By: Genevie Ann M.D.   On: 08/31/2018 13:47   Ct Angio Neck W Or Wo Contrast  Result Date: 08/31/2018 CLINICAL DATA:  73 year old male code stroke. 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 S/p IV tPA EXAM: CT ANGIOGRAPHY HEAD AND NECK TECHNIQUE: Multidetector CT imaging of the head and neck was performed using the standard protocol during bolus administration of intravenous contrast. Multiplanar CT image reconstructions and MIPs were obtained to evaluate the vascular anatomy. Carotid stenosis measurements (when applicable) are obtained utilizing NASCET criteria, using the distal internal carotid diameter as the denominator. CONTRAST:  45mL OMNIPAQUE IOHEXOL 350 MG/ML SOLN COMPARISON:  Plain head CT today. Brain MRI 05/24/2018. CTA head and neck 05/23/2018, and earlier. FINDINGS: CTA NECK Skeleton: No acute osseous  abnormality identified. Upper chest: Negative. Other neck: Stable, negative. Aortic arch: Moderate aortic atherosclerosis appears stable. Three vessel arch configuration. Right carotid system: Negative brachiocephalic artery and proximal CCA. Stable mild soft and calcified plaque at the right carotid bifurcation. Substantially improved enhancement of the cervical right ICA compared to the February CTA, although the vessel remains diminutive to the skull base. See  intracranial findings below. Left carotid system: No left CCA origin stenosis despite plaque. Mild tortuosity of the left CCA. Stable soft plaque at the medial left ICA origin with less than 50 % stenosis with respect to the distal vessel. Vertebral arteries: Chronically occluded right vertebral artery from the origin distally. There is stable reconstitution at the distal right V2/V3 segment as before. The vessel remains faintly enhancing to the skull base as before. Proximal left subclavian artery and left vertebral artery origin are stable without stenosis. The left vertebral artery appears somewhat dominant and remains patent to the skull base without stenosis. CTA HEAD Posterior circulation: The right V4 segment remains patent although is probably mostly supplied from the left. The right PICA origin remains patent. The dominant left vertebral artery supplies the basilar. The left PICA origin remains patent. Patent basilar artery without stenosis. SCA and PCA origins are stable and patent. Left posterior communicating artery redemonstrated and patent. The right Posterior communicating arteries are diminutive or absent. Left PCA branches are stable and within normal limits. 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 The right P2 and distal branches are stable and within normal limits. Anterior circulation: There is faint enhancement of the right ICA siphon today which is highly irregular. As on the 2018 angiogram the siphon appears functionally occluded at the level of the ophthalmic artery ((series 5, image 51). Despite this, the right ICA terminus remains patent, and has a similar appearance to the February CTA. The right MCA and ACA origins remain patent. The right M1 segment, trifurcation, and right MCA branches appear stable. The ACA A1 segments are stable and patent. Diminutive or absent anterior communicating artery. There is new attenuated enhancement of the right ACA A2 on series 12, image 21, this  segment was normal in February. There is preserved distal right ACA enhancement. Left ACA appears stable and within normal limits. Left MCA M1 and bifurcation are patent. Left MCA branches are stable. Venous sinuses: Early venous contrast phase but grossly patent. Anatomic variants: Dominant left vertebral artery. Review of the MIP images confirms the above findings IMPRESSION: 1. 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 Stable right ICA terminus and right MCA branches 2. However, there is new stenosis of the Right ACA A2, at least moderate. 3. Otherwise stable intracranial CTA findings since February, including: - chronic right vertebral artery occlusion with faint reconstitution at the skull base. -severe right PCA P1 stenosis. 4. This study was reviewed in person Study discussed by telephone with Dr. Lorraine Lax 08/31/2018 at 1330 hours. Electronically Signed   By: Genevie Ann M.D.   On: 08/31/2018 13:47   Mr Virgel Paling EX Contrast  Result Date: 09/01/2018 CLINICAL DATA:  73 year old male with code stroke presentation yesterday, history of chronic right ICA and vertebral artery clues *INSERT*, status post IV tPA and endovascular distal right ICA reperfusion yesterday. EXAM: MRI HEAD WITHOUT CONTRAST MRA HEAD WITHOUT CONTRAST TECHNIQUE: Multiplanar, multiecho pulse sequences of the brain and surrounding structures were obtained without intravenous contrast.  Angiographic images of the head were obtained using MRA technique without contrast. COMPARISON:  08/31/2018 CTA CT P and CT head. Prior brain MRI 05/24/2018. FINDINGS: MRI HEAD FINDINGS Brain: Gyriform restricted diffusion throughout the lateral aspect of much of the right cerebral hemisphere from the temporal lobe toward the vertex and including the insula. Associated T2 and FLAIR hyperintensity. No evidence of hemorrhage on T2* imaging. No mass effect.  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 No deep gray matter nuclei, posterior fossa, or contralateral left hemisphere restricted diffusion. No midline shift, mass effect, evidence of mass lesion, ventriculomegaly, extra-axial collection or acute intracranial hemorrhage. Cervicomedullary junction and pituitary are within normal limits. Stable gray and white matter signal outside of the right hemisphere. There are tiny chronic lacunar infarcts in the posterior right cerebellum. Vascular: The right ICA flow void is improved in the visible neck and at the skull base compared to February. Stable other Major intracranial vascular flow voids. Skull and upper cervical spine: Negative visible cervical spine. Normal bone marrow signal. Sinuses/Orbits: Mild paranasal sinus fluid levels and mucosal thickening. Negative orbits. Other: Mastoids remain clear. Intubated. Small volume of fluid in the visible pharynx. MRA HEAD FINDINGS Antegrade flow in the distal left vertebral artery with patent left PICA origin. There is also faint flow signal in the right V4 segment and right PICA origin. Antegrade flow in the basilar artery without stenosis. Patent SCA and PCA origins. Left PCA branches are within normal limits but there is loss of flow signal in the right PCA beyond its origin. Normal left posterior communicating, the right posterior communicating artery is diminutive or absent. Antegrade flow in the left ICA siphon with no stenosis. Left ophthalmic and posterior communicating artery origins are normal. Left ICA terminus, MCA and ACA origins are normal. Diminutive or absent anterior communicating artery. Bilateral ACA branches appear within normal limits, including the right ACA A2 segment which appeared stenotic by CTA yesterday (series 404, image 11). Left MCA M1 and branches are within normal limits. There is antegrade flow signal in the right ICA. There is segmental loss of flow signal in the  supraclinoid ICA is seen on series 404, image 15, but the right ICA terminus appears patent. The right MCA and ACA origins are patent. The right A1 is diminutive. The right MCA M1, trifurcation, and visible right MCA branches are within normal limits. IMPRESSION: 1. Widespread gyriform restricted diffusion in the right hemisphere compatible with cortical ischemia. No associated hemorrhage or mass effect. No other areas of acute brain infarct. 2. Intracranial MRA reveals a diminutive but patent right ICA, although focal loss of flow signal in the supraclinoid segment raising the possibility of residual stenosis. 3. The visible right MCA and right ACA appear patent without stenosis - including the right A2 segment which was thought to be stenotic by CTA yesterday. 4. Poor flow in the right PCA, which demonstrated severe stenosis by CTA yesterday. There is some faint flow signal in the right vertebral artery V4 segment despite chronic right vertebral occlusion. Electronically Signed   By: Genevie Ann M.D.   On: 09/01/2018 15:49   Mr Brain Wo Contrast  Result Date: 09/01/2018 CLINICAL DATA:  73 year old male with code stroke presentation yesterday, history of chronic right ICA and vertebral artery clues *INSERT*, status post IV tPA and endovascular distal right ICA reperfusion yesterday. EXAM: MRI HEAD WITHOUT CONTRAST MRA HEAD WITHOUT CONTRAST TECHNIQUE: Multiplanar, multiecho pulse sequences of the brain and surrounding structures were obtained without intravenous contrast. Angiographic  images of the head were obtained using MRA technique without contrast. COMPARISON:  08/31/2018 CTA CT P and CT head. Prior brain MRI 05/24/2018. FINDINGS: MRI HEAD FINDINGS Brain: Gyriform restricted diffusion throughout the lateral aspect of much of the right cerebral hemisphere from the temporal lobe toward the vertex and including the insula. Associated T2 and FLAIR hyperintensity. No evidence of hemorrhage on T2* imaging. No mass  effect. 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 No deep gray matter nuclei, posterior fossa, or contralateral left hemisphere restricted diffusion. No midline shift, mass effect, evidence of mass lesion, ventriculomegaly, extra-axial collection or acute intracranial hemorrhage. Cervicomedullary junction and pituitary are within normal limits. Stable gray and white matter signal outside of the right hemisphere. There are tiny chronic lacunar infarcts in the posterior right cerebellum. Vascular: The right ICA flow void is improved in the visible neck and at the skull base compared to February. Stable other Major intracranial vascular flow voids. Skull and upper cervical spine: Negative visible cervical spine. Normal bone marrow signal. Sinuses/Orbits: Mild paranasal sinus fluid levels and mucosal thickening. Negative orbits. Other: Mastoids remain clear. Intubated. Small volume of fluid in the visible pharynx. MRA HEAD FINDINGS Antegrade flow in the distal left vertebral artery with patent left PICA origin. There is also faint flow signal in the right V4 segment and right PICA origin. Antegrade flow in the basilar artery without stenosis. Patent SCA and PCA origins. Left PCA branches are within normal limits but there is loss of flow signal in the right PCA beyond its origin. Normal left posterior communicating, the right posterior communicating artery is diminutive or absent. Antegrade flow in the left ICA siphon with no stenosis. Left ophthalmic and posterior communicating artery origins are normal. Left ICA terminus, MCA and ACA origins are normal. Diminutive or absent anterior communicating artery. Bilateral ACA branches appear within normal limits, including the right ACA A2 segment which appeared stenotic by CTA yesterday (series 404, image 11). Left MCA M1 and branches are within normal limits. There is antegrade flow signal in the right ICA. There is segmental loss of flow signal in  the supraclinoid ICA is seen on series 404, image 15, but the right ICA terminus appears patent. The right MCA and ACA origins are patent. The right A1 is diminutive. The right MCA M1, trifurcation, and visible right MCA branches are within normal limits. IMPRESSION: 1. Widespread gyriform restricted diffusion in the right hemisphere compatible with cortical ischemia. No associated hemorrhage or mass effect. No other areas of acute brain infarct. 2. Intracranial MRA reveals a diminutive but patent right ICA, although focal loss of flow signal in the supraclinoid segment raising the possibility of residual stenosis. 3. The visible right MCA and right ACA appear patent without stenosis - including the right A2 segment which was thought to be stenotic by CTA yesterday. 4. Poor flow in the right PCA, which demonstrated severe stenosis by CTA yesterday. There is some faint flow signal in the right vertebral artery V4 segment despite chronic right vertebral occlusion. Electronically Signed   By: Genevie Ann M.D.   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 Presents today with right hemisphere ischemia symptoms. EXAM: CT PERFUSION BRAIN TECHNIQUE: Multiphase CT imaging of the brain was performed following IV bolus contrast injection. Subsequent parametric perfusion maps were calculated using  RAPID software. CONTRAST:  50 milliliters Omnipaque 350. COMPARISON:  CTA head and neck earlier today. CT perfusion and CTA 05/23/2018. FINDINGS: CT Brain Perfusion Findings: CBF (<30%) Volume: Zero. And there is also no less than 38% reduction in CBF (versus previously 12 mL in February) Perfusion (Tmax>6.0s) volume: 336mL Versus 335 mL in February, but visually the right hemisphere distribution of prolonged T-max is unchanged.  Hypoperfusion index of 0.4 (a favorable decrease from 0.6 in February). Mismatch Volume: 341mL ASPECTS on noncontrast CT Head: 10 at 1303 hours today. Infarction Location:Not applicable IMPRESSION: Largely unchanged CT perfusion abnormality in the right hemisphere since the February CTP, with actually slightly improved right hemisphere perfusion parameters in terms of CBF and the hypoperfusion index since that time. Electronically Signed   By: Genevie Ann M.D.   On: 08/31/2018 14:49   Dg Chest Port 1 View  Result Date: 09/01/2018 CLINICAL DATA:  Endotracheal tube placement. EXAM: PORTABLE CHEST 1 VIEW COMPARISON:  Radiograph of August 31, 2018. FINDINGS: The heart size and mediastinal contours are within normal limits. Endotracheal tube is in grossly good position. Nasogastric tube has been significant withdrawn, with distal tip and proximal esophagus. No pneumothorax or pleural effusion is noted. Left lung is clear. Mild right basilar subsegmental atelectasis is noted. The visualized skeletal structures are unremarkable. IMPRESSION: Endotracheal tube in grossly good position. Nasogastric tube has been partially withdrawn, with distal tip in expected position of proximal thoracic esophagus; advancement is recommended. Mild right basilar subsegmental atelectasis. Electronically Signed   By: Marijo Conception M.D.   On: 09/01/2018 08:24   Dg Chest Port 1 View  Result Date: 08/31/2018 CLINICAL DATA:  Intubation. EXAM: PORTABLE CHEST 1 VIEW COMPARISON:  05/23/2018 FINDINGS: The endotracheal tube is 2.5 cm above the carina. The NG tube is coursing down the esophagus. The tip is in the fundus near the GE junction in the proximal port is in the distal esophagus. This could be advanced several cm. The heart is normal in size given the AP projection and supine position of the patient. The lungs are clear. No pleural effusion or pneumothorax. The bony thorax is intact. IMPRESSION: 1. The endotracheal tube is in good position,  2.5 cm above the carina. 2. The NG tube could be advanced several cm. 3. No acute cardiopulmonary findings. Electronically Signed   By: Marijo Sanes M.D.   On: 08/31/2018 19:12   Ct Head Code Stroke Wo Contrast  Result Date: 08/31/2018 CLINICAL DATA:  Code stroke. 73 year old male. 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 EXAM: CT HEAD WITHOUT CONTRAST TECHNIQUE: Contiguous axial images were obtained from the base of the skull through the vertex without intravenous contrast. COMPARISON:  Brain MRI 05/24/2018 and earlier. FINDINGS: Brain: Chronic right parietal lobe encephalomalacia. No acute intracranial hemorrhage identified. No midline shift, mass effect, or evidence of intracranial mass lesion. No cortically based acute infarct identified. Stable gray-white matter differentiation throughout the brain. No ventriculomegaly. Vascular: Mild Calcified atherosclerosis at the skull base. No suspicious intracranial vascular hyperdensity. Skull: No acute osseous abnormality identified. Sinuses/Orbits: Hyperplastic sinuses. Visualized paranasal sinuses and mastoids are stable and well pneumatized. Other: Rightward gaze deviation. No acute scalp soft tissue findings. ASPECTS ([place] Stroke Program Early CT Score) - Ganglionic level infarction (caudate, lentiform nuclei, internal capsule, insula, M1-M3 cortex): 7 - Supraganglionic infarction (M4-M6 cortex): 3 Total score (0-10 with 10 being normal): 10 (chronic right parietal lobe encephalomalacia) IMPRESSION: 1. Stable non contrast CT appearance of the brain since February. Chronic right parietal  lobe encephalomalacia. ASPECTS is 10. 2. These results were communicated to Dr. Lorraine Lax at 1:11 pm on 08/31/2018 by text page via the [facility] messaging system. Electronically Signed   By: Genevie Ann M.D.   On: 08/31/2018 13:11    Labs:  CBC: Recent Labs    05/24/18 0008 05/25/18 0546 08/31/18 1259 08/31/18 1306 09/01/18 0406 09/01/18 0500  WBC 6.7 5.8 9.3  --    --  8.5  HGB 10.8* 11.3* 12.6* 13.6 11.9* 11.5*  HCT 36.2* 36.9* 41.5 40.0 35.0* 36.6*  PLT 255 239 236  --   --  216    COAGS: Recent Labs    05/23/18 1840 08/31/18 1259  INR 1.2 1.1  APTT 35 28    BMP: Recent Labs    05/24/18 0008 05/25/18 0546 08/31/18 1259 08/31/18 1306 09/01/18 0406 09/01/18 0500  NA 138 139 138 136 140 141  K 4.3 4.1 4.6 4.8 3.7 3.5  CL 107 106 103 104  --  112*  CO2 23 26 25   --   --  20*  GLUCOSE 148* 104* 132* 132*  --  124*  BUN 25* 12 10 14   --  12  CALCIUM 8.6* 8.9 9.3  --   --  8.3*  CREATININE 1.04 0.79 1.08 1.10  --  0.77  GFRNONAA >60 >60 >60  --   --  >60  GFRAA >60 >60 >60  --   --  >60    LIVER FUNCTION TESTS: Recent Labs    05/23/18 1840 05/24/18 0008 08/31/18 1259  BILITOT 1.7*  --  0.9  AST 42*  --  23  ALT 26  --  17  ALKPHOS 86  --  120  PROT 6.0*  --  7.1  ALBUMIN 3.4* 3.1* 3.6    Assessment and Plan:  CVA 6/4 procedure:  S/P bilateral common carotid arteriograms,followed by balloon angioplasty of occluded supraclinoid RT ICA with RT MCA TICI 3 revascularization Extubated Moving all extr; groin c/d/i; 2+ pulses Rt foot Will follow   Electronically Signed: Lavonia Drafts, PA-C 09/02/2018, 11:10 AM   I spent a total of 15 Minutes at the the patient's bedside AND on the patient's hospital floor or unit, greater than 50% of which was counseling/coordinating care for Rt MCA revascularization

## 2018-09-02 NOTE — Procedures (Signed)
Extubation Procedure Note  Patient Details:   Name: Ronald [name] DOB: Jan 27, 1946 MRN: [id number]   Airway Documentation:  Airway 8 mm (Active)  Secured at (cm) 23 cm 09/02/2018  8:37 AM  Measured From Lips 09/02/2018  8:37 AM  [place] 09/02/2018  8:37 AM  Secured By [employer] 09/02/2018  8:37 AM  Tube Holder Repositioned Yes 09/02/2018  8:37 AM  Cuff Pressure (cm H2O) 28 cm H2O 09/02/2018  8:37 AM  Site Condition Dry 09/02/2018  8:37 AM   Vent end date: (not recorded) Vent end time: (not recorded)   Evaluation  O2 sats: stable throughout Complications: No apparent complications Patient did tolerate procedure well. Bilateral Breath Sounds: Rhonchi, Diminished   Yes  Extubated patient to nasal canula. Patient orient to place and time.  Dimple Nanas 09/02/2018, 10:05 AM

## 2018-09-02 NOTE — Progress Notes (Signed)
NAME:  Ronald [name], MRN:  [id number], DOB:  1945-08-02, LOS: 2 ADMISSION DATE:  08/31/2018, CONSULTATION DATE:  6/4 REFERRING MD:  [place], CHIEF COMPLAINT:  CVA  Brief History   73 year old male with chronic rt ICA occlusion was admitted for CVA.  Received TPA systemically in the ED and was taken to IR for revascularization.  Post IR remained on the ventilator was transferred to the ICU.  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  Significant Hospital Events   6/4 CVA< admit, TPA, IR. To ICU on vent.   Consults:  IR PCCM  Procedures:  6/4 balloon angioplasty of R ICA via neuro IR  Significant Diagnostic Tests:  CTH 6/4 > Stable non contrast CT appearance of the brain since February. Chronic right parietal lobe encephalomalacia. CT angio perfusion head/neck 6/4 > Improved cervical right ICA and right siphon since the CT in February, however there is persistent occlusion of the right ICA at the ophthalmic artery origin. Stable right ICA terminus and right MCA branches. However, there is new stenosis of the Right ACA A2, at least moderate.  Micro Data:  covid > neg  Antimicrobials:   cefazolin peri-op  Interim history/subjective:  Off Cleviprex drip.  Awake and alert.  Neurology is on board with extubation we will assess and perform this needed  Objective   Blood pressure (!) 144/71, pulse 69, temperature 98.6 F (37 C), temperature source Oral, resp. rate 16, weight 97.5 kg, SpO2 100 %.    Vent Mode: PRVC FiO2 (%):  [30 %-40 %] 30 % Set Rate:  [16 bmp] 16 bmp Vt Set:  [550 mL] 550 mL PEEP:  [5 cmH20] 5 cmH20 Plateau Pressure:  [13 cmH20-18 cmH20] 16 cmH20   Intake/Output Summary (Last 24 hours) at 09/02/2018 0827 Last data filed at 09/02/2018 0600 Gross per 24 hour  Intake 948.71 ml  Output 1575 ml   Net -626.29 ml   Filed Weights   08/31/18 1200 08/31/18 1332  Weight: 97.5 kg 97.5 kg    Examination: General: Elderly male who is in no acute distress HEENT: Equal reactive.  Endotracheal tube gastric tube in place Neuro: Despite being on high doses of propofol able to follow commands moves all extremities.  He does have a left hemiplegia CV: Heart sounds are distant.  Off Cleviprex drip. PULM: Diminished in the bases LK:GMWN, non-tender, bsx4 active  Extremities: warm/dry, negative edema  Skin: no rashes or lesions   Resolved Hospital Problem list     Assessment & Plan:  Acute CVA: s/p TPA and IR Currently off Cleviprex drip Plavix Per stroke service  Respiratory insufficiency  Awake alert following commands Moving adequate tidal volume We will assess for extubation 09/02/2018 spoke with neurology from their standpoint no reason not extubated  Hyperglycemia with no DM history CBG (last 3)  Recent Labs    09/01/18 2307 09/02/18 0323 09/02/18 0819  GLUCAP 95 103* 86    SSI scale insulin coverage  Hypertension Holding antihypertensives at this time current blood pressure is 144/71 with a sinus rhythm at 64  Dementia Schizophrenia Patient with questionable baseline mental status 09/02/2018 follows commands and has a dense left hemiplegia  Best practice:  Diet: NPO Pain/Anxiety/Delirium protocol (if indicated): Propofol to keep RASS 0 to -1. PRN fentanyl.  VAP protocol (if indicated): per protocol DVT prophylaxis: SCD GI prophylaxis: Protonix Glucose control:  SSI Mobility: BR Code Status: FULL Family Communication:  Disposition: ICU  Labs   CBC: Recent Labs  Lab 08/31/18 1259 08/31/18 1306 09/01/18 0406 09/01/18 0500  WBC 9.3  --   --  8.5  NEUTROABS 6.9  --   --  6.5  HGB 12.6* 13.6 11.9* 11.5*  HCT 41.5 40.0 35.0* 36.6*  MCV 93.5  --   --  92.0  PLT 236  --   --  098    Basic Metabolic Panel: Recent Labs  Lab 08/31/18 1259  08/31/18 1306 09/01/18 0406 09/01/18 0500  NA 138 136 140 141  K 4.6 4.8 3.7 3.5  CL 103 104  --  112*  CO2 25  --   --  20*  GLUCOSE 132* 132*  --  124*  BUN 10 14  --  12  CREATININE 1.08 1.10  --  0.77  CALCIUM 9.3  --   --  8.3*  MG  --   --   --  1.8  PHOS  --   --   --  3.0   GFR: Estimated Creatinine Clearance: 92.9 mL/min (by C-G formula based on SCr of 0.77 mg/dL). Recent Labs  Lab 08/31/18 1259 09/01/18 0500  WBC 9.3 8.5    Liver Function Tests: Recent Labs  Lab 08/31/18 1259  AST 23  ALT 17  ALKPHOS 120  BILITOT 0.9  PROT 7.1  ALBUMIN 3.6   No results for input(s): LIPASE, AMYLASE in the last 168 hours. No results for input(s): AMMONIA in the last 168 hours.  ABG    Component Value Date/Time   PHART 7.428 09/01/2018 0406   PCO2ART 34.4 09/01/2018 0406   PO2ART 422.0 (H) 09/01/2018 0406   HCO3 22.8 09/01/2018 0406   TCO2 24 09/01/2018 0406   ACIDBASEDEF 1.0 09/01/2018 0406   O2SAT 100.0 09/01/2018 0406     Coagulation Profile: Recent Labs  Lab 08/31/18 1259  INR 1.1    Cardiac Enzymes: No results for input(s): CKTOTAL, CKMB, CKMBINDEX, TROPONINI in the last 168 hours.  HbA1C: Hgb A1c MFr Bld  Date/Time Value Ref Range Status  09/01/2018 05:00 AM 5.1 4.8 - 5.6 % Final    Comment:    (NOTE) Pre diabetes:          5.7%-6.4% Diabetes:              >6.4% Glycemic control for   <7.0% adults with diabetes   05/24/2018 12:08 AM 5.1 4.8 - 5.6 % Final    Comment:    (NOTE) Pre diabetes:          5.7%-6.4% Diabetes:              >6.4% Glycemic control for   <7.0% adults with diabetes     CBG: Recent Labs  Lab 09/01/18 1705 09/01/18 1917 09/01/18 2307 09/02/18 0323 09/02/18 0819  GLUCAP 105* 110* 95 103* 86   App cct 30 min   Richardson Landry  ACNP [facility] P[facility] Pager [phone] till 1 pm If no answer page [phone] 09/02/2018, 8:27 AM

## 2018-09-02 NOTE — Evaluation (Signed)
Physical Therapy Evaluation Patient Details Name: Ronald [name] MRN: [id number] DOB: [birth year] Today's Date: 09/02/2018   History of Present Illness  73 yo male admitted after sudden onset lt sided weakness. Pt with rt MCA infarct and underwent TPA and angioplasty to rt ICA on 6/4. Intubated 6/4-6/6.  PMH: dementia, depression, hypertension, hyperlipidemia, schizophrenia, stroke involving R carotid watershed areas with residual left sided weakness  Clinical Impression  Pt admitted with above diagnosis and presents to PT with functional limitations due to deficits listed below (See PT problem list). Pt needs skilled PT to maximize independence and safety to allow discharge to ST-SNF prior to transition to ALF. Pt was scheduled to move to ALF.      Follow Up Recommendations SNF(prior to transition to ALF)    Equipment Recommendations  None recommended by PT    Recommendations for Other Services       Precautions / Restrictions Precautions Precautions: Fall      Mobility  Bed Mobility Overal bed mobility: Needs Assistance Bed Mobility: Supine to Sit     Supine to sit: Mod assist     General bed mobility comments: assist to elevate trunk and come to left side of bed  Transfers Overall transfer level: Needs assistance Equipment used: Rolling walker (2 wheeled) Transfers: Sit to/from [facility] Sit to Stand: Min assist Stand pivot transfers: Min assist       General transfer comment: Assist to bring hips up and for balance. Pt required incr time and cues to attend to task  Ambulation/Gait Ambulation/Gait assistance: Min assist Gait Distance (Feet): 2 Feet Assistive device: Rolling walker (2 wheeled) Gait Pattern/deviations: Step-to pattern;Decreased step length - right;Decreased step length - left;Shuffle Gait velocity: decr Gait velocity interpretation: <1.31 ft/sec, indicative of household ambulator General Gait Details: Pivotal steps to chair  with walker. Pt with lt hand resting on walker and not holding onto walker  Stairs            Wheelchair Mobility    Modified Rankin (Stroke Patients Only)       Balance Overall balance assessment: Needs assistance Sitting-balance support: No upper extremity supported;Feet supported Sitting balance-Leahy Scale: Fair     Standing balance support: Single extremity supported Standing balance-Leahy Scale: Poor Standing balance comment: walker and min guard for static standing                             Pertinent Vitals/Pain Pain Assessment: No/denies pain    Home Living Family/patient expects to be discharged to:: Assisted living(Pt was scheduled to move to ALF on day of CVA)               Home Equipment: Walker - 2 wheels;Cane - single point;Wheelchair - manual;Tub bench      Prior Function Level of Independence: Independent with assistive device(s)         Comments: Used cane on unlevel surfaces     Hand Dominance   Dominant Hand: Right    Extremity/Trunk Assessment   Upper Extremity Assessment Upper Extremity Assessment: Defer to OT evaluation    Lower Extremity Assessment Lower Extremity Assessment: Overall WFL for tasks assessed(lt slightly weaker than rt but still 4+/5)       Communication   Communication: Expressive difficulties(dysarthric)  Cognition Arousal/Alertness: Awake/alert Behavior During Therapy: WFL for tasks assessed/performed Overall Cognitive Status: Impaired/Different from baseline Area of Impairment: Attention;Memory;Following commands;Safety/judgement;Problem solving;Awareness  Current Attention Level: Sustained Memory: Decreased short-term memory;Decreased recall of precautions Following Commands: Follows one step commands inconsistently Safety/Judgement: Decreased awareness of safety;Decreased awareness of deficits Awareness: Intellectual Problem Solving: Slow processing;Requires  verbal cues;Requires tactile cues General Comments: Lt inattention. Pt perseverating on not getting breakfast (he was on vent) and about home therapy.      General Comments      Exercises     Assessment/Plan    PT Assessment Patient needs continued PT services  PT Problem List Decreased strength;Decreased activity tolerance;Decreased balance;Decreased mobility;Decreased cognition;Decreased knowledge of use of DME;Decreased safety awareness       PT Treatment Interventions DME instruction;Gait training;Functional mobility training;Therapeutic activities;Therapeutic exercise;Balance training;Neuromuscular re-education;Cognitive remediation;Patient/family education    PT Goals (Current goals can be found in the Care Plan section)  Acute Rehab PT Goals Patient Stated Goal: go home PT Goal Formulation: With patient Time For Goal Achievement: 09/16/18 Potential to Achieve Goals: Good    Frequency Min 3X/week   Barriers to discharge Decreased caregiver support      Co-evaluation               AM-PAC PT "6 Clicks" Mobility  Outcome Measure Help needed turning from your back to your side while in a flat bed without using bedrails?: A Little Help needed moving from lying on your back to sitting on the side of a flat bed without using bedrails?: A Lot Help needed moving to and from a bed to a chair (including a wheelchair)?: A Little Help needed standing up from a chair using your arms (e.g., wheelchair or bedside chair)?: A Little Help needed to walk in hospital room?: A Lot Help needed climbing 3-5 steps with a railing? : A Lot 6 Click Score: 15    End of Session Equipment Utilized During Treatment: Gait belt Activity Tolerance: Patient tolerated treatment well Patient left: in chair;with call bell/phone within reach;with chair alarm set Nurse Communication: Mobility status PT Visit Diagnosis: Unsteadiness on feet (R26.81);Other abnormalities of gait and mobility  (R26.89);Hemiplegia and hemiparesis Hemiplegia - Right/Left: Left Hemiplegia - dominant/non-dominant: Non-dominant Hemiplegia - caused by: Cerebral infarction    Time: 1230-1300 PT Time Calculation (min) (ACUTE ONLY): 30 min   Charges:   PT Evaluation $PT Eval Moderate Complexity: 1 Mod PT Treatments $Therapeutic Activity: 8-22 mins        [place] Pager [phone] Office [place] 09/02/2018, 1:59 PM

## 2018-09-02 NOTE — Progress Notes (Signed)
SLP Cancellation Note  Patient Details Name: Ronald [name] MRN: [id number] DOB: 1945/12/16   Cancelled treatment:       Reason Eval/Treat Not Completed: Medical issues which prohibited therapy;Patient not medically ready   Elvina Sidle, M.S., [place] 09/02/2018, 8:39 AM

## 2018-09-02 NOTE — Progress Notes (Signed)
STROKE TEAM PROGRESS NOTE   INTERVAL HISTORY .  Patient is now off Cleviprex drip but remains on low-dose propofol drip for sedation.  Pt still intubated but is awake and following commands.  , he still has significant left sided weakness but is able to move the left leg off the bed.  MRI scan of the brain showed widespread gyriform restricted diffusion in the right hemisphere throughout suggestive of hyperemia rather than true infarct.  Intracranial MRA shows diminished flow of the terminal right carotid but the right MCA seems wide open  Vitals:   09/02/18 0500 09/02/18 0600 09/02/18 0700 09/02/18 0800  BP: 136/70 (!) 149/67 (!) 162/67 (!) 144/71  Pulse: 61 64 69   Resp: 16 16 14 16   Temp:    99 F (37.2 C)  TempSrc:    Axillary  SpO2: 100% 100% 100%   Weight:        CBC:  Recent Labs  Lab 08/31/18 1259  09/01/18 0406 09/01/18 0500  WBC 9.3  --   --  8.5  NEUTROABS 6.9  --   --  6.5  HGB 12.6*   < > 11.9* 11.5*  HCT 41.5   < > 35.0* 36.6*  MCV 93.5  --   --  92.0  PLT 236  --   --  216   < > = values in this interval not displayed.    Basic Metabolic Panel:  Recent Labs  Lab 08/31/18 1259 08/31/18 1306 09/01/18 0406 09/01/18 0500  NA 138 136 140 141  K 4.6 4.8 3.7 3.5  CL 103 104  --  112*  CO2 25  --   --  20*  GLUCOSE 132* 132*  --  124*  BUN 10 14  --  12  CREATININE 1.08 1.10  --  0.77  CALCIUM 9.3  --   --  8.3*  MG  --   --   --  1.8  PHOS  --   --   --  3.0   Lipid Panel:     Component Value Date/Time   CHOL 108 09/01/2018 0500   TRIG 112 09/02/2018 0634   HDL 30 (L) 09/01/2018 0500   CHOLHDL 3.6 09/01/2018 0500   VLDL 25 09/01/2018 0500   LDLCALC 53 09/01/2018 0500   HgbA1c:  Lab Results  Component Value Date   HGBA1C 5.1 09/01/2018   Urine Drug Screen:     Component Value Date/Time   LABOPIA NONE DETECTED 09/01/2018 1041   COCAINSCRNUR NONE DETECTED 09/01/2018 1041   LABBENZ NONE DETECTED 09/01/2018 1041   AMPHETMU NONE DETECTED  09/01/2018 1041   THCU NONE DETECTED 09/01/2018 1041   LABBARB NONE DETECTED 09/01/2018 1041    Alcohol Level     Component Value Date/Time   ETH <5 07/14/2016 1952    IMAGING  Ct Head Code Stroke Wo Contrast 08/31/2018 1. Stable non contrast CT appearance of the brain since February. Chronic right parietal lobe encephalomalacia. ASPECTS is 10.   Ct Angio Head W Or Wo Contrast Ct Angio Neck W Or Wo Contrast 08/31/2018 1. 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 Stable right ICA terminus and right MCA branches 2. However, there is new stenosis of the Right ACA A2, at least moderate. 3. Otherwise stable intracranial CTA findings since February, including: - chronic right vertebral artery occlusion with faint reconstitution at the  skull base. -severe right PCA P1 stenosis.   Ct Cerebral Perfusion W Contrast 08/31/2018 Largely unchanged CT perfusion abnormality in the right hemisphere since the February CTP, with actually slightly improved right hemisphere perfusion parameters in terms of CBF and the hypoperfusion index since that time. Electronically Signed   By: Genevie Ann M.D.   On: 08/31/2018 14:49   Cerebral Angio 08/31/2018 S/P bilateral common carotid arteriograms,followed by balloon angioplasty of occluded supraclinoid RT ICA with RT MCA TICI 3 revascularization  Dg Chest Port 1 View 09/01/2018 Endotracheal tube in grossly good position. Nasogastric tube has been partially withdrawn, with distal tip in expected position of proximal thoracic esophagus; advancement is recommended. Mild right basilar subsegmental atelectasis.  Dg Chest Port 1 View 08/31/2018 1. The endotracheal tube is in good position, 2.5 cm above the carina. 2. The NG tube could be advanced several cm. 3. No acute cardiopulmonary findings.   MR MRA Head Wo Contrast 09/01/2018 IMPRESSION: 1. Widespread  gyriform restricted diffusion in the right hemisphere compatible with cortical ischemia. No associated hemorrhage or mass effect. No other areas of acute brain infarct.  2. Intracranial MRA reveals a diminutive but patent right ICA, although focal loss of flow signal in the supraclinoid segment raising the possibility of residual stenosis.  3. The visible right MCA and right ACA appear patent without stenosis - including the right A2 segment which was thought to be stenotic by CTA yesterday.  4. Poor flow in the right PCA, which demonstrated severe stenosis by CTA yesterday. There is some faint flow signal in the right vertebral artery V4 segment despite chronic right vertebral occlusion.  Transthoracic Echocardiogram  09/01/2018 IMPRESSIONS  1. The left ventricle has normal systolic function, with an ejection fraction of 55-60%. The cavity size was normal. There is mildly increased left ventricular wall thickness. Left ventricular diastolic Doppler parameters are consistent with impaired  relaxation.  2. The right ventricle has normal systolic function. The cavity was normal. There is no increase in right ventricular wall thickness.  3. Left atrial size was mildly dilated.  4. Trivial pericardial effusion is present.  5. The mitral valve is abnormal. Mild thickening of the mitral valve leaflet.  6. The aortic valve is tricuspid. Mild thickening of the aortic valve. Aortic valve regurgitation is trivial by color flow Doppler.  7. The aortic root is normal in size and structure.  8. The inferior vena cava was normal in size with <50% respiratory variability.  9. The interatrial septum was not assessed.     PHYSICAL EXAM  Temp:  [98.1 F (36.7 C)-99 F (37.2 C)] 99 F (37.2 C) (06/06 0800) Pulse Rate:  [61-85] 69 (06/06 0700) Resp:  [11-21] 16 (06/06 0800) BP: (108-162)/(35-100) 144/71 (06/06 0800) SpO2:  [98 %-100 %] 100 % (06/06 0700) Arterial Line BP: (73-149)/(47-92) 111/89  (06/05 2100) FiO2 (%):  [30 %-40 %] 30 % (06/06 0300)  General - Well nourished, well developed elderly African-American male, intubated on sedation.  Ophthalmologic - fundi not visualized due to noncooperation.  Cardiovascular - Regular rate and rhythm.  Neuro - intubated on sedation, eyes open with mid upper gaze position at rest, however, able to follow commands with gaze in all directions but right greater than left., following commands on the right hand and toe. Inconsistently blinking to visual threa on the left.  T, able to track to right, PERRL. Corneal reflex present on the right but not on the left, gag and cough present. Breathing over the vent.  Facial symmetry not able to test due to ET tube.  Tongue midline in mouth. RUE spontaneous movement 4/5, able to follow commands with right hand. LUE 2/5. RLE 4/5    LLE 3/5 strength. DTR diminished and no babinski. Sensation, coordination not cooperative and gait not tested.   ASSESSMENT/PLAN Mr. Kendarious Gudino is a 73 y.o. male with history of stroke, schizophrenia, measles, hypertension, hyperlipidemia, depression, dementia presenting with L sided weakness, R gaze deviation and dysarthria.  Received IV tPA 08/31/2018 at 1320. Taken to IR with angioplasty and TICI3 of right MCA.  Stroke: R MCA infarct  s/p tPA and angioplasty R terminal ICA, infarct  secondary to large vessel disease source  Code Stroke CT head No acute stroke. Old R parietal lobe encephalomalacia.  ASPECTS 10.     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 New stenosis R ACA A2.   CT perfusion large penumbra, essentially unchanged since Feb  Cerebral angio R terminal ICA angioplasty w. TICI 3 revascularization  Post IR CT no ICH, mass effect or shift  MRI -  Widespread gyriform restricted diffusion in the right hemisphere compatible with cortical ischemia.   MRA - diminutive but patent  right ICA, although focal loss of flow signal in the supraclinoid segment raising the possibility of residual stenosis. Poor flow in the right PCA, which demonstrated severe stenosis by CTA yesterday. There is some faint flow signal in the right vertebral artery V4 segment despite chronic right vertebral occlusion.  2D Echo  - EF 55-60%. No cardiac source of emboli identified.   LDL 53  HgbA1c 5.1  SCDs for VTE prophylaxis  clopidogrel 75 mg daily prior to admission, now on  aspirin 325 mg daily and clopidogrel 75 mg daily following MRI  Therapy recommendations:  pending   Disposition:  pending   Respiratory Insufficiency  Intubated for IR  CCM on board  Plan for possible extubation after sheath removal and MRI/MRA  On sedation but able to follow commands.  Right ICA occlusion  In 06/2016 CTA showed right ICA occlusion at ophthalmic level with distal reconstitution  04/2018 admission CTA showed right ICA occlusion at bifurcation, progressed from 06/2016  This admission still with R ICA occlusion at ophthalmic level though some recannalization  S/p IR with terminal ICA angioplasty with TICI3 reperfusion  Avoid low BP  History of stroke/TIA  04/2018 TIA due to right ICA occlusion in the setting of hypotension. MRI neg. Found to have R ICA occlusion at origin, R ICA siphon occluded 2 years prior. On plavix. Home with family though difficulty to care for.  06/2016 admitted for a fall and difficulty walking. Also had a cough and fever due to left lower lobe PNA. MRI showed right MCA/ACA, MCA/PCA infarcts. CTA head neck showed right ICA occlusion at the ophthalmic level with distal reconstitution, right P1 stenosis and right VA occlusion. IR also confirmed right ICA occlusion, no intervention offered. LDL 86 and A1c 5.4, patient discharged with DAPT for 3 months and Lipitor 80.  Hypertension  Home meds:  Lisinopril 20  Now on cleviprex post IR  Stable . BP goal 120-150   24h post IR  Hyperlipidemia  Home meds:  lipitor 80, resumed in hospital  LDL 53, goal < 70  Continue statin at discharge  Dysphagia  Secondary to stroke  Diet - NPO  Check swallow once extubated  Other Stroke Risk Factors  Advanced age  Obesity, Body mass index is 33.67 kg/m., recommend weight loss, diet and exercise as appropriate   Other Active Problems  Dementia at baseline  Bipolar disorder  Prostate cancer  Hospital day # 2 The patient has shown neurological improvement.  Recommend wean and discontinue propofol drip.  Extubate later this morning as tolerated.  Change blood pressure goal to systolic greater than 588 and less than 180.  Use as needed labetalol or hydralazine as needed.  Continue aspirin and Plavix.  Speech therapy swallow eval after extubation.  Discussed with critical care team nurse practitioner Francene Castle.   This patient is critically ill due to right MCA infarct, right ICA occlusion, history of stroke/TIA, s/p tPA and at significant risk of neurological worsening, death form recurrent stroke, hemorrhagic conversion, heart failure, seizure, aspiration PNA. This patient's care requires constant monitoring of vital signs, hemodynamics, respiratory and cardiac monitoring, review of multiple databases, neurological assessment, discussion with family, other specialists and medical decision making of high complexity. I spent 35 minutes of neurocritical care time in the care of this patient.  Antony Contras, MD To contact Stroke Continuity provider, please refer to [email or web address] After hours, contact General Neurology

## 2018-09-03 ENCOUNTER — Inpatient Hospital Stay (HOSPITAL_COMMUNITY): Payer: Medicare HMO

## 2018-09-03 LAB — GLUCOSE, CAPILLARY
Glucose-Capillary: 104 mg/dL — ABNORMAL HIGH (ref 70–99)
Glucose-Capillary: 104 mg/dL — ABNORMAL HIGH (ref 70–99)
Glucose-Capillary: 123 mg/dL — ABNORMAL HIGH (ref 70–99)
Glucose-Capillary: 145 mg/dL — ABNORMAL HIGH (ref 70–99)
Glucose-Capillary: 160 mg/dL — ABNORMAL HIGH (ref 70–99)
Glucose-Capillary: 161 mg/dL — ABNORMAL HIGH (ref 70–99)

## 2018-09-03 LAB — PHOSPHORUS: Phosphorus: 2.8 mg/dL (ref 2.5–4.6)

## 2018-09-03 LAB — MAGNESIUM: Magnesium: 1.9 mg/dL (ref 1.7–2.4)

## 2018-09-03 MED ORDER — METOPROLOL TARTRATE 50 MG PO TABS
50.0000 mg | ORAL_TABLET | Freq: Two times a day (BID) | ORAL | Status: DC
  Administered 2018-09-03 – 2018-09-14 (×22): 50 mg via ORAL
  Filled 2018-09-03 (×22): qty 1

## 2018-09-03 MED ORDER — CLONIDINE HCL 0.1 MG PO TABS
0.1000 mg | ORAL_TABLET | Freq: Two times a day (BID) | ORAL | Status: DC
  Administered 2018-09-03 – 2018-09-13 (×20): 0.1 mg via ORAL
  Filled 2018-09-03 (×20): qty 1

## 2018-09-03 MED ORDER — LISINOPRIL 10 MG PO TABS
10.0000 mg | ORAL_TABLET | Freq: Every day | ORAL | Status: DC
  Administered 2018-09-03 – 2018-09-14 (×12): 10 mg via ORAL
  Filled 2018-09-03 (×7): qty 1
  Filled 2018-09-03: qty 4
  Filled 2018-09-03 (×4): qty 1

## 2018-09-03 MED ORDER — RESOURCE THICKENUP CLEAR PO POWD
ORAL | Status: DC | PRN
  Filled 2018-09-03: qty 125

## 2018-09-03 NOTE — Progress Notes (Signed)
Referring Physician(s): Dr Leonie Man  Supervising Physician: Luanne Bras  Patient Status:  [facility] - In-pt  Chief Complaint:  CVA 6/4 procedure:  S/P bilateral common carotid arteriograms,followed by balloon angioplasty of occluded supraclinoid RT ICA with RT MCA TICI 3 revascularization   Subjective:  Up in bed; eating soft diet Left arm weak Moving all 4s Following commands Appropriate and alert  Allergies: Dust mite extract; Pollen extract; and Rye grass flower pollen extract [gramineae pollens]  Medications: Prior to Admission medications   Medication Sig Start Date End Date Taking? Authorizing Provider  atorvastatin (LIPITOR) 80 MG tablet Take 80 mg by mouth daily. 01/23/18  Yes [provider]  cloNIDine (CATAPRES) 0.1 MG tablet Take 0.1 mg by mouth 2 (two) times daily.  07/04/18  Yes [provider]  clopidogrel (PLAVIX) 75 MG tablet Take 1 tablet (75 mg total) by mouth daily. 07/22/16  Yes Jani Gravel, MD  lisinopril (PRINIVIL,ZESTRIL) 20 MG tablet Take 1 tablet (20 mg total) by mouth daily. Patient taking differently: Take 30 mg by mouth daily.  05/26/18  Yes Donzetta Starch, NP  metoprolol tartrate (LOPRESSOR) 50 MG tablet Take 50 mg by mouth 2 (two) times daily.   Yes [provider]  Calcium-Vitamin D 600-200 MG-UNIT per tablet Take 1 tablet by mouth daily.    [provider]  fluticasone (FLONASE) 50 MCG/ACT nasal spray Place 1 spray into both nostrils daily.    [provider]     Vital Signs: BP (!) 179/88   Pulse 80   Temp 98 F (36.7 C) (Oral)   Resp 19   Wt 214 lb 15.2 oz (97.5 kg)   SpO2 99%   BMI 33.67 kg/m   Physical Exam Vitals signs reviewed.  Constitutional:      Comments: Able to repeat my name  Communicating appropriately Speech slow; maybe sl slur  HENT:     Head:     Comments: Minimal left face droop Eyes:     Extraocular Movements: Extraocular movements intact.  Musculoskeletal:         General: No tenderness.     Right lower leg: No edema.     Left lower leg: No edema.     Comments: Moving all 4s to command Left arm weak   Skin:    General: Skin is warm.     Comments: Rt groin c/d/i  Neurological:     Mental Status: He is alert and oriented to person, place, and time.     Imaging: Ct Angio Head W Or Wo Contrast  Result Date: 08/31/2018 CLINICAL DATA:  73 year old male code stroke. 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 S/p IV tPA EXAM: CT ANGIOGRAPHY HEAD AND NECK TECHNIQUE: Multidetector CT imaging of the head and neck was performed using the standard protocol during bolus administration of intravenous contrast. Multiplanar CT image reconstructions and MIPs were obtained to evaluate the vascular anatomy. Carotid stenosis measurements (when applicable) are obtained utilizing NASCET criteria, using the distal internal carotid diameter as the denominator. CONTRAST:  64mL OMNIPAQUE IOHEXOL 350 MG/ML SOLN COMPARISON:  Plain head CT today. Brain MRI 05/24/2018. CTA head and neck 05/23/2018, and earlier. FINDINGS: CTA NECK Skeleton: No acute osseous abnormality identified. Upper chest: Negative. Other neck: Stable, negative. Aortic arch: Moderate aortic atherosclerosis appears stable. Three vessel arch configuration. Right carotid system: Negative brachiocephalic artery and proximal CCA. Stable mild soft and calcified plaque at the right carotid bifurcation. Substantially improved enhancement of the cervical right ICA  compared to the February CTA, although the vessel remains diminutive to the skull base. See intracranial findings below. Left carotid system: No left CCA origin stenosis despite plaque. Mild tortuosity of the left CCA. Stable soft plaque at the medial left ICA origin with less than 50 % stenosis with respect to the distal vessel. Vertebral arteries: Chronically occluded right vertebral artery from the origin distally. There is stable reconstitution at the  distal right V2/V3 segment as before. The vessel remains faintly enhancing to the skull base as before. Proximal left subclavian artery and left vertebral artery origin are stable without stenosis. The left vertebral artery appears somewhat dominant and remains patent to the skull base without stenosis. CTA HEAD Posterior circulation: The right V4 segment remains patent although is probably mostly supplied from the left. The right PICA origin remains patent. The dominant left vertebral artery supplies the basilar. The left PICA origin remains patent. Patent basilar artery without stenosis. SCA and PCA origins are stable and patent. Left posterior communicating artery redemonstrated and patent. The right Posterior communicating arteries are diminutive or absent. Left PCA branches are stable and within normal limits. 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 The right P2 and distal branches are stable and within normal limits. Anterior circulation: There is faint enhancement of the right ICA siphon today which is highly irregular. As on the 2018 angiogram the siphon appears functionally occluded at the level of the ophthalmic artery ((series 5, image 51). Despite this, the right ICA terminus remains patent, and has a similar appearance to the February CTA. The right MCA and ACA origins remain patent. The right M1 segment, trifurcation, and right MCA branches appear stable. The ACA A1 segments are stable and patent. Diminutive or absent anterior communicating artery. There is new attenuated enhancement of the right ACA A2 on series 12, image 21, this segment was normal in February. There is preserved distal right ACA enhancement. Left ACA appears stable and within normal limits. Left MCA M1 and bifurcation are patent. Left MCA branches are stable. Venous sinuses: Early venous contrast phase but grossly patent. Anatomic variants: Dominant left vertebral artery. Review of the MIP images confirms the above  findings IMPRESSION: 1. 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 Stable right ICA terminus and right MCA branches 2. However, there is new stenosis of the Right ACA A2, at least moderate. 3. Otherwise stable intracranial CTA findings since February, including: - chronic right vertebral artery occlusion with faint reconstitution at the skull base. -severe right PCA P1 stenosis. 4. This study was reviewed in person Study discussed by telephone with Dr. Lorraine Lax 08/31/2018 at 1330 hours. Electronically Signed   By: Genevie Ann M.D.   On: 08/31/2018 13:47   Ct Angio Neck W Or Wo Contrast  Result Date: 08/31/2018 CLINICAL DATA:  73 year old male code stroke. 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 S/p IV tPA EXAM: CT ANGIOGRAPHY HEAD AND NECK TECHNIQUE: Multidetector CT imaging of the head and neck was performed using the standard protocol during bolus administration of intravenous contrast. Multiplanar CT image reconstructions and MIPs were obtained to evaluate the vascular anatomy. Carotid stenosis measurements (when applicable) are obtained utilizing NASCET criteria, using the distal internal carotid diameter as the denominator. CONTRAST:  55mL OMNIPAQUE IOHEXOL 350 MG/ML SOLN COMPARISON:  Plain head CT today. Brain MRI 05/24/2018. CTA head and neck 05/23/2018, and earlier. FINDINGS: CTA  NECK Skeleton: No acute osseous abnormality identified. Upper chest: Negative. Other neck: Stable, negative. Aortic arch: Moderate aortic atherosclerosis appears stable. Three vessel arch configuration. Right carotid system: Negative brachiocephalic artery and proximal CCA. Stable mild soft and calcified plaque at the right carotid bifurcation. Substantially improved enhancement of the cervical right ICA compared to the February CTA, although the vessel remains diminutive to the skull base. See intracranial  findings below. Left carotid system: No left CCA origin stenosis despite plaque. Mild tortuosity of the left CCA. Stable soft plaque at the medial left ICA origin with less than 50 % stenosis with respect to the distal vessel. Vertebral arteries: Chronically occluded right vertebral artery from the origin distally. There is stable reconstitution at the distal right V2/V3 segment as before. The vessel remains faintly enhancing to the skull base as before. Proximal left subclavian artery and left vertebral artery origin are stable without stenosis. The left vertebral artery appears somewhat dominant and remains patent to the skull base without stenosis. CTA HEAD Posterior circulation: The right V4 segment remains patent although is probably mostly supplied from the left. The right PICA origin remains patent. The dominant left vertebral artery supplies the basilar. The left PICA origin remains patent. Patent basilar artery without stenosis. SCA and PCA origins are stable and patent. Left posterior communicating artery redemonstrated and patent. The right Posterior communicating arteries are diminutive or absent. Left PCA branches are stable and within normal limits. 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 The right P2 and distal branches are stable and within normal limits. Anterior circulation: There is faint enhancement of the right ICA siphon today which is highly irregular. As on the 2018 angiogram the siphon appears functionally occluded at the level of the ophthalmic artery ((series 5, image 51). Despite this, the right ICA terminus remains patent, and has a similar appearance to the February CTA. The right MCA and ACA origins remain patent. The right M1 segment, trifurcation, and right MCA branches appear stable. The ACA A1 segments are stable and patent. Diminutive or absent anterior communicating artery. There is new attenuated enhancement of the right ACA A2 on series 12, image 21, this segment was  normal in February. There is preserved distal right ACA enhancement. Left ACA appears stable and within normal limits. Left MCA M1 and bifurcation are patent. Left MCA branches are stable. Venous sinuses: Early venous contrast phase but grossly patent. Anatomic variants: Dominant left vertebral artery. Review of the MIP images confirms the above findings IMPRESSION: 1. 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 Stable right ICA terminus and right MCA branches 2. However, there is new stenosis of the Right ACA A2, at least moderate. 3. Otherwise stable intracranial CTA findings since February, including: - chronic right vertebral artery occlusion with faint reconstitution at the skull base. -severe right PCA P1 stenosis. 4. This study was reviewed in person Study discussed by telephone with Dr. Lorraine Lax 08/31/2018 at 1330 hours. Electronically Signed   By: Genevie Ann M.D.   On: 08/31/2018 13:47   Mr Virgel Paling YB Contrast  Result Date: 09/01/2018 CLINICAL DATA:  73 year old male with code stroke presentation yesterday, history of chronic right ICA and vertebral artery clues *INSERT*, status post IV tPA and endovascular distal right ICA reperfusion yesterday. EXAM: MRI HEAD WITHOUT CONTRAST MRA HEAD WITHOUT CONTRAST TECHNIQUE: Multiplanar, multiecho pulse sequences of the brain and surrounding structures  were obtained without intravenous contrast. Angiographic images of the head were obtained using MRA technique without contrast. COMPARISON:  08/31/2018 CTA CT P and CT head. Prior brain MRI 05/24/2018. FINDINGS: MRI HEAD FINDINGS Brain: Gyriform restricted diffusion throughout the lateral aspect of much of the right cerebral hemisphere from the temporal lobe toward the vertex and including the insula. Associated T2 and FLAIR hyperintensity. No evidence of hemorrhage on T2* imaging. No mass effect. Pre-existing  right parietal and periatrial region encephalomalacia. No deep gray matter nuclei, posterior fossa, or contralateral left hemisphere restricted diffusion. No midline shift, mass effect, evidence of mass lesion, ventriculomegaly, extra-axial collection or acute intracranial hemorrhage. Cervicomedullary junction and pituitary are within normal limits. Stable gray and white matter signal outside of the right hemisphere. There are tiny chronic lacunar infarcts in the posterior right cerebellum. Vascular: The right ICA flow void is improved in the visible neck and at the skull base compared to February. Stable other Major intracranial vascular flow voids. Skull and upper cervical spine: Negative visible cervical spine. Normal bone marrow signal. Sinuses/Orbits: Mild paranasal sinus fluid levels and mucosal thickening. Negative orbits. Other: Mastoids remain clear. Intubated. Small volume of fluid in the visible pharynx. MRA HEAD FINDINGS Antegrade flow in the distal left vertebral artery with patent left PICA origin. There is also faint flow signal in the right V4 segment and right PICA origin. Antegrade flow in the basilar artery without stenosis. Patent SCA and PCA origins. Left PCA branches are within normal limits but there is loss of flow signal in the right PCA beyond its origin. Normal left posterior communicating, the right posterior communicating artery is diminutive or absent. Antegrade flow in the left ICA siphon with no stenosis. Left ophthalmic and posterior communicating artery origins are normal. Left ICA terminus, MCA and ACA origins are normal. Diminutive or absent anterior communicating artery. Bilateral ACA branches appear within normal limits, including the right ACA A2 segment which appeared stenotic by CTA yesterday (series 404, image 11). Left MCA M1 and branches are within normal limits. There is antegrade flow signal in the right ICA. There is segmental loss of flow signal in the supraclinoid ICA  is seen on series 404, image 15, but the right ICA terminus appears patent. The right MCA and ACA origins are patent. The right A1 is diminutive. The right MCA M1, trifurcation, and visible right MCA branches are within normal limits. IMPRESSION: 1. Widespread gyriform restricted diffusion in the right hemisphere compatible with cortical ischemia. No associated hemorrhage or mass effect. No other areas of acute brain infarct. 2. Intracranial MRA reveals a diminutive but patent right ICA, although focal loss of flow signal in the supraclinoid segment raising the possibility of residual stenosis. 3. The visible right MCA and right ACA appear patent without stenosis - including the right A2 segment which was thought to be stenotic by CTA yesterday. 4. Poor flow in the right PCA, which demonstrated severe stenosis by CTA yesterday. There is some faint flow signal in the right vertebral artery V4 segment despite chronic right vertebral occlusion. Electronically Signed   By: Genevie Ann M.D.   On: 09/01/2018 15:49   Mr Brain Wo Contrast  Result Date: 09/01/2018 CLINICAL DATA:  73 year old male with code stroke presentation yesterday, history of chronic right ICA and vertebral artery clues *INSERT*, status post IV tPA and endovascular distal right ICA reperfusion yesterday. EXAM: MRI HEAD WITHOUT CONTRAST MRA HEAD WITHOUT CONTRAST TECHNIQUE: Multiplanar, multiecho pulse sequences of the brain and surrounding structures were  obtained without intravenous contrast. Angiographic images of the head were obtained using MRA technique without contrast. COMPARISON:  08/31/2018 CTA CT P and CT head. Prior brain MRI 05/24/2018. FINDINGS: MRI HEAD FINDINGS Brain: Gyriform restricted diffusion throughout the lateral aspect of much of the right cerebral hemisphere from the temporal lobe toward the vertex and including the insula. Associated T2 and FLAIR hyperintensity. No evidence of hemorrhage on T2* imaging. No mass effect.  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 No deep gray matter nuclei, posterior fossa, or contralateral left hemisphere restricted diffusion. No midline shift, mass effect, evidence of mass lesion, ventriculomegaly, extra-axial collection or acute intracranial hemorrhage. Cervicomedullary junction and pituitary are within normal limits. Stable gray and white matter signal outside of the right hemisphere. There are tiny chronic lacunar infarcts in the posterior right cerebellum. Vascular: The right ICA flow void is improved in the visible neck and at the skull base compared to February. Stable other Major intracranial vascular flow voids. Skull and upper cervical spine: Negative visible cervical spine. Normal bone marrow signal. Sinuses/Orbits: Mild paranasal sinus fluid levels and mucosal thickening. Negative orbits. Other: Mastoids remain clear. Intubated. Small volume of fluid in the visible pharynx. MRA HEAD FINDINGS Antegrade flow in the distal left vertebral artery with patent left PICA origin. There is also faint flow signal in the right V4 segment and right PICA origin. Antegrade flow in the basilar artery without stenosis. Patent SCA and PCA origins. Left PCA branches are within normal limits but there is loss of flow signal in the right PCA beyond its origin. Normal left posterior communicating, the right posterior communicating artery is diminutive or absent. Antegrade flow in the left ICA siphon with no stenosis. Left ophthalmic and posterior communicating artery origins are normal. Left ICA terminus, MCA and ACA origins are normal. Diminutive or absent anterior communicating artery. Bilateral ACA branches appear within normal limits, including the right ACA A2 segment which appeared stenotic by CTA yesterday (series 404, image 11). Left MCA M1 and branches are within normal limits. There is antegrade flow signal in the right ICA. There is segmental loss of flow signal in the  supraclinoid ICA is seen on series 404, image 15, but the right ICA terminus appears patent. The right MCA and ACA origins are patent. The right A1 is diminutive. The right MCA M1, trifurcation, and visible right MCA branches are within normal limits. IMPRESSION: 1. Widespread gyriform restricted diffusion in the right hemisphere compatible with cortical ischemia. No associated hemorrhage or mass effect. No other areas of acute brain infarct. 2. Intracranial MRA reveals a diminutive but patent right ICA, although focal loss of flow signal in the supraclinoid segment raising the possibility of residual stenosis. 3. The visible right MCA and right ACA appear patent without stenosis - including the right A2 segment which was thought to be stenotic by CTA yesterday. 4. Poor flow in the right PCA, which demonstrated severe stenosis by CTA yesterday. There is some faint flow signal in the right vertebral artery V4 segment despite chronic right vertebral occlusion. Electronically Signed   By: Genevie Ann M.D.   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 Presents today with right hemisphere ischemia symptoms. EXAM: CT PERFUSION BRAIN TECHNIQUE: Multiphase CT imaging of the brain was performed following IV bolus contrast injection. Subsequent parametric  perfusion maps were calculated using RAPID software. CONTRAST:  50 milliliters Omnipaque 350. COMPARISON:  CTA head and neck earlier today. CT perfusion and CTA 05/23/2018. FINDINGS: CT Brain Perfusion Findings: CBF (<30%) Volume: Zero. And there is also no less than 38% reduction in CBF (versus previously 12 mL in February) Perfusion (Tmax>6.0s) volume: 317mL Versus 335 mL in February, but visually the right hemisphere distribution of prolonged T-max is unchanged. Hypoperfusion  index of 0.4 (a favorable decrease from 0.6 in February). Mismatch Volume: 353mL ASPECTS on noncontrast CT Head: 10 at 1303 hours today. Infarction Location:Not applicable IMPRESSION: Largely unchanged CT perfusion abnormality in the right hemisphere since the February CTP, with actually slightly improved right hemisphere perfusion parameters in terms of CBF and the hypoperfusion index since that time. Electronically Signed   By: Genevie Ann M.D.   On: 08/31/2018 14:49   Dg Chest Port 1 View  Result Date: 09/01/2018 CLINICAL DATA:  Endotracheal tube placement. EXAM: PORTABLE CHEST 1 VIEW COMPARISON:  Radiograph of August 31, 2018. FINDINGS: The heart size and mediastinal contours are within normal limits. Endotracheal tube is in grossly good position. Nasogastric tube has been significant withdrawn, with distal tip and proximal esophagus. No pneumothorax or pleural effusion is noted. Left lung is clear. Mild right basilar subsegmental atelectasis is noted. The visualized skeletal structures are unremarkable. IMPRESSION: Endotracheal tube in grossly good position. Nasogastric tube has been partially withdrawn, with distal tip in expected position of proximal thoracic esophagus; advancement is recommended. Mild right basilar subsegmental atelectasis. Electronically Signed   By: Marijo Conception M.D.   On: 09/01/2018 08:24   Dg Chest Port 1 View  Result Date: 08/31/2018 CLINICAL DATA:  Intubation. EXAM: PORTABLE CHEST 1 VIEW COMPARISON:  05/23/2018 FINDINGS: The endotracheal tube is 2.5 cm above the carina. The NG tube is coursing down the esophagus. The tip is in the fundus near the GE junction in the proximal port is in the distal esophagus. This could be advanced several cm. The heart is normal in size given the AP projection and supine position of the patient. The lungs are clear. No pleural effusion or pneumothorax. The bony thorax is intact. IMPRESSION: 1. The endotracheal tube is in good position, 2.5 cm above the  carina. 2. The NG tube could be advanced several cm. 3. No acute cardiopulmonary findings. Electronically Signed   By: Marijo Sanes M.D.   On: 08/31/2018 19:12   Ct Head Code Stroke Wo Contrast  Result Date: 08/31/2018 CLINICAL DATA:  Code stroke. 73 year old male. 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 EXAM: CT HEAD WITHOUT CONTRAST TECHNIQUE: Contiguous axial images were obtained from the base of the skull through the vertex without intravenous contrast. COMPARISON:  Brain MRI 05/24/2018 and earlier. FINDINGS: Brain: Chronic right parietal lobe encephalomalacia. No acute intracranial hemorrhage identified. No midline shift, mass effect, or evidence of intracranial mass lesion. No cortically based acute infarct identified. Stable gray-white matter differentiation throughout the brain. No ventriculomegaly. Vascular: Mild Calcified atherosclerosis at the skull base. No suspicious intracranial vascular hyperdensity. Skull: No acute osseous abnormality identified. Sinuses/Orbits: Hyperplastic sinuses. Visualized paranasal sinuses and mastoids are stable and well pneumatized. Other: Rightward gaze deviation. No acute scalp soft tissue findings. ASPECTS ([place] Stroke Program Early CT Score) - Ganglionic level infarction (caudate, lentiform nuclei, internal capsule, insula, M1-M3 cortex): 7 - Supraganglionic infarction (M4-M6 cortex): 3 Total score (0-10 with 10 being normal): 10 (chronic right parietal lobe encephalomalacia) IMPRESSION: 1. Stable non contrast CT appearance of the brain  since February. Chronic right parietal lobe encephalomalacia. ASPECTS is 10. 2. These results were communicated to Dr. Lorraine Lax at 1:11 pm on 08/31/2018 by text page via the [facility] messaging system. Electronically Signed   By: Genevie Ann M.D.   On: 08/31/2018 13:11    Labs:  CBC: Recent Labs    05/24/18 0008 05/25/18 0546 08/31/18 1259 08/31/18 1306 09/01/18 0406 09/01/18 0500  WBC 6.7 5.8 9.3  --   --  8.5  HGB  10.8* 11.3* 12.6* 13.6 11.9* 11.5*  HCT 36.2* 36.9* 41.5 40.0 35.0* 36.6*  PLT 255 239 236  --   --  216    COAGS: Recent Labs    05/23/18 1840 08/31/18 1259  INR 1.2 1.1  APTT 35 28    BMP: Recent Labs    05/25/18 0546 08/31/18 1259 08/31/18 1306 09/01/18 0406 09/01/18 0500 09/03/18 0726  NA 139 138 136 140 141 141  K 4.1 4.6 4.8 3.7 3.5 3.4*  CL 106 103 104  --  112* 108  CO2 26 25  --   --  20* 23  GLUCOSE 104* 132* 132*  --  124* 104*  BUN 12 10 14   --  12 4*  CALCIUM 8.9 9.3  --   --  8.3* 8.4*  CREATININE 0.79 1.08 1.10  --  0.77 0.68  GFRNONAA >60 >60  --   --  >60 >60  GFRAA >60 >60  --   --  >60 >60    LIVER FUNCTION TESTS: Recent Labs    05/23/18 1840 05/24/18 0008 08/31/18 1259  BILITOT 1.7*  --  0.9  AST 42*  --  23  ALT 26  --  17  ALKPHOS 86  --  120  PROT 6.0*  --  7.1  ALBUMIN 3.4* 3.1* 3.6    Assessment and Plan:  CVA R MCA revascularization 6/4 Will follow Plan per Stroke  Electronically Signed: Lavonia Drafts, PA-C 09/03/2018, 9:14 AM   I spent a total of 15 Minutes at the the patient's bedside AND on the patient's hospital floor or unit, greater than 50% of which was counseling/coordinating care for R MCA revascularization

## 2018-09-03 NOTE — Progress Notes (Signed)
STROKE TEAM PROGRESS NOTE   INTERVAL HISTORY .  Patient was extubated yesterday and has done well and is breathing comfortably.  He is off all sedation and Cleviprex drip.  He still has dysarthria and left hemiplegia.  Blood pressure is adequately controlled Vitals:   09/03/18 0600 09/03/18 0610 09/03/18 0700 09/03/18 0710  BP: (!) 165/136 (!) 169/81 (!) 199/92 (!) 179/88  Pulse: 75 73 78 80  Resp: (!) 25 (!) 21 20 19   Temp:      TempSrc:      SpO2: 100% 99% 99% 99%  Weight:        CBC:  Recent Labs  Lab 08/31/18 1259  09/01/18 0406 09/01/18 0500  WBC 9.3  --   --  8.5  NEUTROABS 6.9  --   --  6.5  HGB 12.6*   < > 11.9* 11.5*  HCT 41.5   < > 35.0* 36.6*  MCV 93.5  --   --  92.0  PLT 236  --   --  216   < > = values in this interval not displayed.    Basic Metabolic Panel:  Recent Labs  Lab 08/31/18 1259 08/31/18 1306 09/01/18 0406 09/01/18 0500  NA 138 136 140 141  K 4.6 4.8 3.7 3.5  CL 103 104  --  112*  CO2 25  --   --  20*  GLUCOSE 132* 132*  --  124*  BUN 10 14  --  12  CREATININE 1.08 1.10  --  0.77  CALCIUM 9.3  --   --  8.3*  MG  --   --   --  1.8  PHOS  --   --   --  3.0   Lipid Panel:     Component Value Date/Time   CHOL 108 09/01/2018 0500   TRIG 112 09/02/2018 0634   HDL 30 (L) 09/01/2018 0500   CHOLHDL 3.6 09/01/2018 0500   VLDL 25 09/01/2018 0500   LDLCALC 53 09/01/2018 0500   HgbA1c:  Lab Results  Component Value Date   HGBA1C 5.1 09/01/2018   Urine Drug Screen:     Component Value Date/Time   LABOPIA NONE DETECTED 09/01/2018 1041   COCAINSCRNUR NONE DETECTED 09/01/2018 1041   LABBENZ NONE DETECTED 09/01/2018 1041   AMPHETMU NONE DETECTED 09/01/2018 1041   THCU NONE DETECTED 09/01/2018 1041   LABBARB NONE DETECTED 09/01/2018 1041    Alcohol Level     Component Value Date/Time   ETH <5 07/14/2016 1952    IMAGING  Ct Head Code Stroke Wo Contrast 08/31/2018 1. Stable non contrast CT appearance of the brain since February.  Chronic right parietal lobe encephalomalacia. ASPECTS is 10.   Ct Angio Head W Or Wo Contrast Ct Angio Neck W Or Wo Contrast 08/31/2018 1. 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 Stable right ICA terminus and right MCA branches 2. However, there is new stenosis of the Right ACA A2, at least moderate. 3. Otherwise stable intracranial CTA findings since February, including: - chronic right vertebral artery occlusion with faint reconstitution at the skull base. -severe right PCA P1 stenosis.   Ct Cerebral Perfusion W Contrast 08/31/2018 Largely unchanged CT perfusion abnormality in the right hemisphere since the February CTP, with actually slightly improved right hemisphere perfusion parameters in terms of CBF and the hypoperfusion index since that time. Electronically Signed  By: Genevie Ann M.D.   On: 08/31/2018 14:49   Cerebral Angio 08/31/2018 S/P bilateral common carotid arteriograms,followed by balloon angioplasty of occluded supraclinoid RT ICA with RT MCA TICI 3 revascularization   Dg Chest Port 1 View -09/03/2018-  Improved aeration in the right lung. Probable atelectasis in the  Right hilum       Dg Chest Port 1 View 09/01/2018 Endotracheal tube in grossly good position. Nasogastric tube has been partially withdrawn, with distal tip in expected position of proximal thoracic esophagus; advancement is recommended. Mild right basilar subsegmental atelectasis.  Dg Chest Port 1 View 08/31/2018 1. The endotracheal tube is in good position, 2.5 cm above the carina. 2. The NG tube could be advanced several cm. 3. No acute cardiopulmonary findings.   MR MRA Head Wo Contrast 09/01/2018 IMPRESSION: 1. Widespread gyriform restricted diffusion in the right hemisphere compatible with cortical ischemia. No associated hemorrhage or mass effect. No other areas of acute brain infarct.  2.  Intracranial MRA reveals a diminutive but patent right ICA, although focal loss of flow signal in the supraclinoid segment raising the possibility of residual stenosis.  3. The visible right MCA and right ACA appear patent without stenosis - including the right A2 segment which was thought to be stenotic by CTA yesterday.  4. Poor flow in the right PCA, which demonstrated severe stenosis by CTA yesterday. There is some faint flow signal in the right vertebral artery V4 segment despite chronic right vertebral occlusion.  Transthoracic Echocardiogram  09/01/2018 IMPRESSIONS  1. The left ventricle has normal systolic function, with an ejection fraction of 55-60%. The cavity size was normal. There is mildly increased left ventricular wall thickness. Left ventricular diastolic Doppler parameters are consistent with impaired  relaxation.  2. The right ventricle has normal systolic function. The cavity was normal. There is no increase in right ventricular wall thickness.  3. Left atrial size was mildly dilated.  4. Trivial pericardial effusion is present.  5. The mitral valve is abnormal. Mild thickening of the mitral valve leaflet.  6. The aortic valve is tricuspid. Mild thickening of the aortic valve. Aortic valve regurgitation is trivial by color flow Doppler.  7. The aortic root is normal in size and structure.  8. The inferior vena cava was normal in size with <50% respiratory variability.  9. The interatrial septum was not assessed.   PHYSICAL EXAM  Temp:  [98 F (36.7 C)-99.4 F (37.4 C)] 98.8 F (37.1 C) (06/07 0400) Pulse Rate:  [66-97] 80 (06/07 0710) Resp:  [14-26] 19 (06/07 0710) BP: (147-199)/(61-136) 179/88 (06/07 0710) SpO2:  [88 %-100 %] 99 % (06/07 0710) FiO2 (%):  [30 %] 30 % (06/06 0837)  General - Well nourished, well developed elderly African-American male,   Ophthalmologic - fundi not visualized due to noncooperation.  Cardiovascular - Regular rate and  rhythm.  Neuro -he is awake alert oriented to time place and person.  He has moderate dysarthria but can be understood.  He has right gaze preference, able to follow commands with gaze in all directions but right greater than left., following commands on the right hand and toe. Inconsistently blinking to visual threat on the left.  , able to track to right, PERRL. Corneal reflex present on the right but not on the left, left lower facial weakness.  Tongue midline in mouth. RUE spontaneous movement 4/5, able to follow commands with right hand. LUE 2/5. RLE 4/5    LLE 3/5 strength. DTR diminished and  no babinski. Sensation, coordination not cooperative and gait not tested. NIHSS 10 ASSESSMENT/PLAN Mr. Rajvir Ernster is a 73 y.o. male with history of stroke, schizophrenia, measles, hypertension, hyperlipidemia, depression, dementia presenting with L sided weakness, R gaze deviation and dysarthria.  Received IV tPA 08/31/2018 at 1320. Taken to IR with angioplasty and TICI3 of right MCA.   Stroke: R MCA infarct  s/p tPA and angioplasty R terminal ICA, infarct  secondary to large vessel disease    Code Stroke CT head No acute stroke. Old R parietal lobe encephalomalacia.  ASPECTS 10.     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 New stenosis R ACA A2.   CT perfusion large penumbra, essentially unchanged since Feb  Cerebral angio R terminal ICA angioplasty w. TICI 3 revascularization  Post IR CT no ICH, mass effect or shift  MRI -  Widespread gyriform restricted diffusion in the right hemisphere compatible with cortical ischemia.   MRA - diminutive but patent right ICA, although focal loss of flow signal in the supraclinoid segment raising the possibility of residual stenosis. Poor flow in the right PCA, which demonstrated severe stenosis by CTA yesterday. There is some faint flow signal in the right vertebral artery V4  segment despite chronic right vertebral occlusion.  2D Echo  - EF 55-60%. No cardiac source of emboli identified.   LDL 53  HgbA1c 5.1  SCDs for VTE prophylaxis  clopidogrel 75 mg daily prior to admission, now on  aspirin 325 mg daily and clopidogrel 75 mg daily following MRI  Therapy recommendations:  SNF   Disposition:  pending   Respiratory Insufficiency  Intubated for IR  CCM on board  Plan for possible extubation after sheath removal and MRI/MRA  On sedation but able to follow commands.  Right ICA occlusion  In 06/2016 CTA showed right ICA occlusion at ophthalmic level with distal reconstitution  04/2018 admission CTA showed right ICA occlusion at bifurcation, progressed from 06/2016  This admission still with R ICA occlusion at ophthalmic level though some recannalization  S/p IR with terminal ICA angioplasty with TICI3 reperfusion  Avoid low BP  History of stroke/TIA  04/2018 TIA due to right ICA occlusion in the setting of hypotension. MRI neg. Found to have R ICA occlusion at origin, R ICA siphon occluded 2 years prior. On plavix. Home with family though difficulty to care for.  06/2016 admitted for a fall and difficulty walking. Also had a cough and fever due to left lower lobe PNA. MRI showed right MCA/ACA, MCA/PCA infarcts. CTA head neck showed right ICA occlusion at the ophthalmic level with distal reconstitution, right P1 stenosis and right VA occlusion. IR also confirmed right ICA occlusion, no intervention offered. LDL 86 and A1c 5.4, patient discharged with DAPT for 3 months and Lipitor 80.  Hypertension  Home meds:  Lisinopril 20 (Catapres and Metoprolol PTA ?)  Now on cleviprex post IR  Stable . BP goal 120-150  24h post IR . BP now 160s - 190s . Will order Lisinopril 10 mg daily  Hyperlipidemia  Home meds:  lipitor 80, resumed in hospital  LDL 53, goal < 70  Continue statin at discharge  Dysphagia  Secondary to stroke  Diet -  NPO  Check swallow once extubated  Other Stroke Risk Factors  Advanced age  Obesity, Body mass index is 33.67 kg/m., recommend weight loss, diet and exercise as appropriate   Other  Active Problems  Dementia at baseline  Bipolar disorder  Prostate cancer  PLAN Mobilize out of bed.  Physical occupational and rehab team consults.  Resume home medications.  Transfer to neurology floor bed later today Hospital day # 3 The patient has shown neurological improvement.   Plan mobilize out of bed and therapy consults.  keep blood pressure goal to systolic greater than 929 and less than 180.  Use as needed labetalol or hydralazine as needed.  Continue aspirin and Plavix.     Discussed with critical care team MD Dr Maryjean Ka     I have spent a total of    64minutes with the patient reviewing hospital notes,  test results, labs and examining the patient as well as establishing an assessment and plan that was discussed personally with the patient.  > 50% of time was spent in direct patient care.  Antony Contras, MD    To contact Stroke Continuity provider, please refer to [email or web address] After hours, contact General Neurology

## 2018-09-04 ENCOUNTER — Inpatient Hospital Stay (HOSPITAL_COMMUNITY): Payer: Medicare HMO

## 2018-09-04 ENCOUNTER — Encounter (HOSPITAL_COMMUNITY): Payer: Self-pay | Admitting: Interventional Radiology

## 2018-09-04 LAB — GLUCOSE, CAPILLARY
Glucose-Capillary: 107 mg/dL — ABNORMAL HIGH (ref 70–99)
Glucose-Capillary: 125 mg/dL — ABNORMAL HIGH (ref 70–99)
Glucose-Capillary: 88 mg/dL (ref 70–99)
Glucose-Capillary: 106 mg/dL — ABNORMAL HIGH (ref 70–99)

## 2018-09-04 LAB — BASIC METABOLIC PANEL
Anion gap: 10 (ref 5–15)
BUN: <5 mg/dL — ABNORMAL LOW (ref 8–23)
CO2: 23 mmol/L (ref 22–32)
Calcium: 8.4 mg/dL — ABNORMAL LOW (ref 8.9–10.3)
Chloride: 108 mmol/L (ref 98–111)
Creatinine, Ser: 0.68 mg/dL (ref 0.61–1.24)
GFR calc Af Amer: >60 mL/min (ref >60)
GFR calc non Af Amer: >60 mL/min (ref >60)
Glucose, Bld: 104 mg/dL — ABNORMAL HIGH (ref 70–99)
Potassium: 3.4 mmol/L — ABNORMAL LOW (ref 3.5–5.1)
Sodium: 141 mmol/L (ref 135–145)

## 2018-09-04 NOTE — Progress Notes (Signed)
Inpatient Rehab Admissions:  Inpatient Rehab Consult received.  I met with pt at the bedside for rehabilitation assessment. Per PT, pt has improved and has demonstrated progress towards goals with recommendations now for CIR. Pt does not feel he needs rehab at this time and feels he is able to go directly to ALF. AC has placed a call to his brother Barbaraann Rondo, with his permission, to begin discussions about post acute rehab venues to determine which venue is most appropriate.   Will follow up tomorrow.   Jhonnie Garner, OTR/L  Rehab Admissions Coordinator  [phone] 09/04/2018 5:10 PM

## 2018-09-04 NOTE — Progress Notes (Signed)
STROKE TEAM PROGRESS NOTE   INTERVAL HISTORY Patient was transferred to [address] this morning.  He continues to do well with improving left-sided strength.  He has seen by the therapist who recommended inpatient rehab.  He has no new complaints.  Vital signs remained stable.  Vitals:   09/04/18 0500 09/04/18 0600 09/04/18 0700 09/04/18 0900  BP:    (!) 163/82  Pulse: 64 74 64 88  Resp: (!) 22 (!) 23 (!) 21   Temp:    98.4 F (36.9 C)  TempSrc:    Oral  SpO2: 94% 96% 93% 100%  Weight:        CBC:  Recent Labs  Lab 08/31/18 1259  09/01/18 0406 09/01/18 0500  WBC 9.3  --   --  8.5  NEUTROABS 6.9  --   --  6.5  HGB 12.6*   < > 11.9* 11.5*  HCT 41.5   < > 35.0* 36.6*  MCV 93.5  --   --  92.0  PLT 236  --   --  216   < > = values in this interval not displayed.    Basic Metabolic Panel:  Recent Labs  Lab 09/01/18 0500 09/03/18 0726  NA 141 141  K 3.5 3.4*  CL 112* 108  CO2 20* 23  GLUCOSE 124* 104*  BUN 12 4*  CREATININE 0.77 0.68  CALCIUM 8.3* 8.4*  MG 1.8 1.9  PHOS 3.0 2.8    IMAGING Ct Head Code Stroke Wo Contrast 08/31/2018 1. Stable non contrast CT appearance of the brain since February. Chronic right parietal lobe encephalomalacia. ASPECTS is 10.   Ct Angio Head W Or Wo Contrast Ct Angio Neck W Or Wo Contrast 08/31/2018 1. 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 Stable right ICA terminus and right MCA branches 2. However, there is new stenosis of the Right ACA A2, at least moderate. 3. Otherwise stable intracranial CTA findings since February, including: - chronic right vertebral artery occlusion with faint reconstitution at the skull base. -severe right PCA P1 stenosis.   Ct Cerebral Perfusion W Contrast 08/31/2018 Largely unchanged CT perfusion abnormality in the right hemisphere since the February CTP, with actually slightly improved right  hemisphere perfusion parameters in terms of CBF and the hypoperfusion index since that time. Electronically Signed   By: Genevie Ann M.D.   On: 08/31/2018 14:49   Cerebral Angio 08/31/2018 S/P bilateral common carotid arteriograms,followed by balloon angioplasty of occluded supraclinoid RT ICA with RT MCA TICI 3 revascularization  MR MRA Head Wo Contrast 09/01/2018 IMPRESSION: 1. Widespread gyriform restricted diffusion in the right hemisphere compatible with cortical ischemia. No associated hemorrhage or mass effect. No other areas of acute brain infarct.  2. Intracranial MRA reveals a diminutive but patent right ICA, although focal loss of flow signal in the supraclinoid segment raising the possibility of residual stenosis.  3. The visible right MCA and right ACA appear patent without stenosis - including the right A2 segment which was thought to be stenotic by CTA yesterday.  4. Poor flow in the right PCA, which demonstrated severe stenosis by CTA yesterday. There is some faint flow signal in the right vertebral artery V4 segment despite chronic right vertebral occlusion.  Transthoracic Echocardiogram  09/01/2018 IMPRESSIONS  1. The left ventricle has normal systolic function, with an ejection fraction of 55-60%. The cavity size was normal. There  is mildly increased left ventricular wall thickness. Left ventricular diastolic Doppler parameters are consistent with impaired  relaxation.  2. The right ventricle has normal systolic function. The cavity was normal. There is no increase in right ventricular wall thickness.  3. Left atrial size was mildly dilated.  4. Trivial pericardial effusion is present.  5. The mitral valve is abnormal. Mild thickening of the mitral valve leaflet.  6. The aortic valve is tricuspid. Mild thickening of the aortic valve. Aortic valve regurgitation is trivial by color flow Doppler.  7. The aortic root is normal in size and structure.  8. The inferior vena cava was  normal in size with <50% respiratory variability.  9. The interatrial septum was not assessed.  Dg Chest Port 1 View 09/03/2018 Improved aeration in the right lung. Probable atelectasis in the  Right hilum    09/01/2018 Endotracheal tube in grossly good position. Nasogastric tube has been partially withdrawn, with distal tip in expected position of proximal thoracic esophagus; advancement is recommended. Mild right basilar subsegmental atelectasis.  08/31/2018 1. The endotracheal tube is in good position, 2.5 cm above the carina. 2. The NG tube could be advanced several cm. 3. No acute cardiopulmonary findings.    PHYSICAL EXAM   General -pleasant elderly   African-American male, not in distress  . Afebrile. Head is nontraumatic. Neck is supple without bruit.    Cardiac exam no murmur or gallop. Lungs are clear to auscultation. Distal pulses are well felt. Neurological Exam :  -he is awake alert oriented to time place and person.  He has moderate dysarthria but can be understood.  He has right gaze preference, able to follow commands with gaze in all directions but right greater than left., following commands on the right hand and toe. Inconsistently blinking to visual threat on the left.  , able to track to right, PERRL. Corneal reflex present on the right but not on the left, left lower facial weakness.  Tongue midline in mouth. RUE spontaneous movement 4/5, able to follow commands with right hand. LUE 3/5. RLE 4/5    LLE 4/5 strength. DTR diminished and no babinski. Sensation, coordination not cooperative and gait not tested.     ASSESSMENT/PLAN [name] is a 73 y.o. male with history of stroke, schizophrenia, measles, hypertension, hyperlipidemia, depression, dementia presenting with L sided weakness, R gaze deviation and dysarthria.  Received IV tPA 08/31/2018 at 1320. Taken to IR with angioplasty and TICI3 of right MCA.   Stroke: R MCA infarct  s/p tPA and angioplasty R terminal  ICA, infarct  secondary to large vessel disease    Code Stroke CT head No acute stroke. Old R parietal lobe encephalomalacia.  ASPECTS 10.     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 New stenosis R ACA A2.   CT perfusion large penumbra, essentially unchanged since Feb  Cerebral angio R terminal ICA angioplasty w. TICI 3 revascularization  Post IR CT no ICH, mass effect or shift  MRI -  Widespread gyriform restricted diffusion in the right hemisphere compatible with cortical ischemia.   MRA - diminutive but patent right ICA, although focal loss of flow signal in the supraclinoid segment raising the possibility of residual stenosis. Poor flow in the right PCA, which demonstrated severe stenosis by CTA yesterday. There is some faint flow signal in the right vertebral artery V4 segment despite chronic right vertebral occlusion.  2D Echo  - EF 55-60%. No cardiac source of emboli identified.   LDL 53  HgbA1c 5.1  SCDs for VTE prophylaxis  clopidogrel 75 mg daily prior to admission, now on  aspirin 325 mg daily and clopidogrel 75 mg daily. Continue DAPT at d/c  X 3 months and elective right ICA stenting in 4-6 weeks  Therapy recommendations:  SNF   Disposition:  pending   Respiratory Insufficiency, resolved  Intubated for IR  Now extubated  Right ICA occlusion  In 06/2016 CTA showed right ICA occlusion at ophthalmic level with distal reconstitution  04/2018 admission CTA showed right ICA occlusion at bifurcation, progressed from 06/2016  This admission still with R ICA occlusion at ophthalmic level though some recannalization  S/p IR with terminal ICA angioplasty with TICI3 reperfusion  Avoid low BP  History of stroke/TIA  04/2018 TIA due to right ICA occlusion in the setting of hypotension. MRI neg. Found to have R ICA occlusion at origin, R ICA siphon occluded 2 years prior. On plavix. Home with  family though difficulty to care for.  06/2016 admitted for a fall and difficulty walking. Also had a cough and fever due to left lower lobe PNA. MRI showed right MCA/ACA, MCA/PCA infarcts. CTA head neck showed right ICA occlusion at the ophthalmic level with distal reconstitution, right P1 stenosis and right VA occlusion. IR also confirmed right ICA occlusion, no intervention offered. LDL 86 and A1c 5.4, patient discharged with DAPT for 3 months and Lipitor 80.  Hypertension  Home meds:  Lisinopril 20 (Catapres and Metoprolol PTA ?) . Now on Lisinopril 10 mg daily  Stable  Hyperlipidemia  Home meds:  lipitor 80, resumed in hospital  LDL 53, goal < 70  Continue statin at discharge  Dysphagia, resolved  On D3 thin Diet   Other Stroke Risk Factors  Advanced age  Obesity, Body mass index is 33.67 kg/m., recommend weight loss, diet and exercise as appropriate   Other Active Problems  Dementia at baseline  Bipolar disorder  Prostate cancer  Hospital day # 4  .  Patient continues to make improvement in his left hemiparesis.  Continue aspirin and Plavix and aggressive risk factor modification.  Continue ongoing therapies and transfer to inpatient rehab when bed available.  He will likely need elective right cavernous carotid angioplasty/stenting after he finishes inpatient rehab.  Discussed with patient and Dr. Estanislado Pandy and answered questions.  Greater than 50% time during this 25-minute visit was spent on counseling and coordination of care about his strokes and discussion about treatment options for symptomatic carotid stenosis and answering questions  Antony Contras, MD  To contact Stroke Continuity provider, please refer to [email or web address] After hours, contact General Neurology

## 2018-09-04 NOTE — Progress Notes (Signed)
Modified Barium Swallow Progress Note  Patient Details  Name: Ronald [name] MRN: [id number] Date of Birth: [birth year]  Today's Date: 09/04/2018  Modified Barium Swallow completed.  Full report located under Chart Review in the Imaging Section.  Brief recommendations include the following:  Clinical Impression  Pt demonstrated mild oral dysphagia marked by reduced propulsion of pill with water and solid. Minimal difficulty containing bolus with sublingual residue. Pharyngeal phase overall was within functional limits. Swallow was initiated late at the pyriform sinuses with liquids but duration was brief. His motor function adequately protected trachea to prevent penetration or aspiration. The barium pill transited from the valleculae after multiple trials thin . Recommend pt can return to thin liquids, straws allowed, meds whole in applesauce, upgrade to Dys 3 solids and check oral cavity for pocketed food. Will continue to follow for safety and efficiency.     Swallow Evaluation Recommendations       SLP Diet Recommendations: Dysphagia 3 (Mech soft) solids;Thin liquid   Liquid Administration via: Straw;Cup   Medication Administration: Whole meds with puree   Supervision: Patient able to self feed;Intermittent supervision to cue for compensatory strategies   Compensations: Slow rate;Small sips/bites;Lingual sweep for clearance of pocketing   Postural Changes: Seated upright at 90 degrees   Oral Care Recommendations: Oral care BID        Houston Siren 09/04/2018,10:57 AM   Orbie Pyo Colvin Caroli.Ed Risk analyst [phone] Office [phone]

## 2018-09-04 NOTE — Progress Notes (Signed)
Pt moved to 3W14. Pt assisted into bed. Met by NT. Heather aware of pts arrival. LM with brother Barbaraann Rondo with new room # and phone number. Pt had no belongings.

## 2018-09-04 NOTE — Progress Notes (Signed)
Report called to [facility] on 3W14.

## 2018-09-04 NOTE — Progress Notes (Addendum)
Physical Therapy Treatment Patient Details Name: Ronald [name] MRN: [id number] DOB: 08/23/1945 Today's Date: 09/04/2018    History of Present Illness 73 yo male admitted after sudden onset lt sided weakness. Pt with rt MCA infarct and underwent TPA and angioplasty to rt ICA on 6/4. Intubated 6/4-6/6.  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 Con't to recommend SNF upon d/c.  Follow Up Recommendations  CIR    Equipment Recommendations  None recommended by PT    Recommendations for Other Services       Precautions / Restrictions Precautions Precautions: Fall Restrictions Weight Bearing Restrictions: No    Mobility  Bed Mobility Overal bed mobility: Needs Assistance Bed Mobility: Supine to Sit     Supine to sit: Mod assist     General bed mobility comments: pt able to bring LEs off EOB, unable to use L UE functionally, max directional verbal cues and HOB elevated  Transfers Overall transfer level: Needs assistance Equipment used: Rolling walker (2 wheeled) Transfers: Sit to/from Stand Sit to Stand: Min assist         General transfer comment: min A to steady pt, pt pushed up from bed  Ambulation/Gait Ambulation/Gait assistance: Min assist;Mod assist Gait Distance (Feet): 125 Feet Assistive device: Rolling walker (2 wheeled) Gait Pattern/deviations: Step-through pattern;Decreased stride length;Shuffle Gait velocity: decreased Gait velocity interpretation: <1.31 ft/sec, indicative of household ambulator General Gait Details: pt required min/modA for walker amangement, pt unable to grip with L Hand or  maintain hand resting on L grip of RW requiring maxA to keep L hand on. pt requiring assist for walker management, pt easily distracted as well   Stairs             Wheelchair Mobility    Modified Rankin (Stroke Patients Only)       Balance Overall balance assessment: Needs assistance Sitting-balance support: Feet supported;No upper extremity supported Sitting balance-Leahy Scale: Fair     Standing balance support: Single extremity supported Standing balance-Leahy Scale: Poor Standing balance comment: walker and min guard for static standing                            Cognition Arousal/Alertness: Awake/alert Behavior During Therapy: WFL for tasks assessed/performed Overall Cognitive Status: Impaired/Different from baseline Area of Impairment: Attention;Memory;Following commands;Safety/judgement;Problem solving;Awareness                   Current Attention Level: Sustained Memory: Decreased short-term memory Following Commands: Follows one step commands with increased time;Follows one step commands inconsistently Safety/Judgement: Decreased awareness of safety;Decreased awareness of deficits(decreased insight to L UE) Awareness: Intellectual Problem Solving: Slow processing;Requires verbal cues;Requires tactile cues General Comments: pt with L inattention, pt perseveration on events and people from the past      Exercises      General Comments General comments (skin integrity, edema, etc.): VSS      Pertinent Vitals/Pain Pain Assessment: No/denies pain    Home Living                      Prior Function  PT Goals (current goals can now be found in the care plan section) Acute Rehab PT Goals Patient Stated Goal: go home Progress towards PT goals: Progressing toward goals    Frequency    Min 3X/week      PT Plan Current plan remains appropriate    Co-evaluation              AM-PAC PT "6 Clicks"  Mobility   Outcome Measure  Help needed turning from your back to your side while in a flat bed without using bedrails?: A Little Help needed moving from lying on your back to sitting on the side of a flat bed without using bedrails?: A Little Help needed moving to and from a bed to a chair (including a wheelchair)?: A Little Help needed standing up from a chair using your arms (e.g., wheelchair or bedside chair)?: A Little Help needed to walk in hospital room?: A Lot Help needed climbing 3-5 steps with a railing? : A Lot 6 Click Score: 16    End of Session Equipment Utilized During Treatment: Gait belt Activity Tolerance: Patient tolerated treatment well Patient left: in chair;with call bell/phone within reach;with chair alarm set Nurse Communication: Mobility status PT Visit Diagnosis: Unsteadiness on feet (R26.81);Other abnormalities of gait and mobility (R26.89);Hemiplegia and hemiparesis Hemiplegia - Right/Left: Left Hemiplegia - dominant/non-dominant: Non-dominant Hemiplegia - caused by: Cerebral infarction     Time: 0735-0805 PT Time Calculation (min) (ACUTE ONLY): 30 min  Charges:  $Gait Training: 8-22 mins $Therapeutic Activity: 8-22 mins                     Kittie Plater, PT, DPT Acute Rehabilitation Services Pager #: [phone] Office #: [phone]    Berline Lopes 09/04/2018, 10:07 AM

## 2018-09-04 NOTE — Progress Notes (Deleted)
Inpatient Rehabilitation-Admissions Coordinator   Met with pt at the bedside for rehabilitation assessment. Noted recommendations per therapy are SNF. AC in agreement for SNF placement for post acute rehab. AC will sign off. CM aware.   Please call if questions.   Jhonnie Garner, OTR/L  Rehab Admissions Coordinator  [phone] 09/04/2018 1:48 PM

## 2018-09-05 DIAGNOSIS — I6300 Cerebral infarction due to thrombosis of unspecified precerebral artery: Secondary | ICD-10-CM

## 2018-09-05 LAB — GLUCOSE, CAPILLARY
Glucose-Capillary: 101 mg/dL — ABNORMAL HIGH (ref 70–99)
Glucose-Capillary: 103 mg/dL — ABNORMAL HIGH (ref 70–99)
Glucose-Capillary: 105 mg/dL — ABNORMAL HIGH (ref 70–99)
Glucose-Capillary: 115 mg/dL — ABNORMAL HIGH (ref 70–99)
Glucose-Capillary: 139 mg/dL — ABNORMAL HIGH (ref 70–99)
Glucose-Capillary: 77 mg/dL (ref 70–99)

## 2018-09-05 MED ORDER — PANTOPRAZOLE SODIUM 40 MG PO TBEC
40.0000 mg | DELAYED_RELEASE_TABLET | Freq: Every day | ORAL | Status: DC
  Administered 2018-09-05 – 2018-09-13 (×9): 40 mg via ORAL
  Filled 2018-09-05 (×9): qty 1

## 2018-09-05 MED ORDER — ADULT MULTIVITAMIN W/MINERALS CH
1.0000 | ORAL_TABLET | Freq: Every day | ORAL | Status: DC
  Administered 2018-09-05 – 2018-09-14 (×10): 1 via ORAL
  Filled 2018-09-05 (×10): qty 1

## 2018-09-05 MED ORDER — ENSURE ENLIVE PO LIQD
237.0000 mL | Freq: Two times a day (BID) | ORAL | Status: DC
  Administered 2018-09-05 – 2018-09-14 (×19): 237 mL via ORAL
  Filled 2018-09-05: qty 237

## 2018-09-05 NOTE — TOC Initial Note (Signed)
Transition of Care [facility]) - Initial/Assessment Note    Patient Details  Name: Ronald [name] MRN: [id number] Date of Birth: 02-23-1946  [facility]) CM/SW Contact:    Gabrielle Dare Phone Number: 09/05/2018, 4:25 PM  Clinical Narrative:   CSW spoke with pt's sister Remo Kirschenmann at [phone] due to pt's orientation X 2.  Pt's sister would like for pt to receive OT services from [facility] and then go to [facility]  In [place] on [address].                Expected Discharge Plan: Assisted Living Barriers to Discharge: Continued Medical Work up   Patient Goals and CMS Choice Patient states their goals for this hospitalization and ongoing recovery are:: Pt's sister states "to be able to walk without his walker; to go back to the way he was. CMS Medicare.gov Compare Post Acute Care list provided to:: Other (Comment Required)(CMS medicare.gov email to sister Florian Buff) Choice offered to / list presented to : Sibling  Expected Discharge Plan and Services Expected Discharge Plan: Assisted Living       Living arrangements for the past 2 months: Single Family Home                   DME Agency: NA       HH Arranged: NA HH Agency: NA        Prior Living Arrangements/Services Living arrangements for the past 2 months: Single Family Home Lives with:: Self Patient language and need for interpreter reviewed:: No Do you feel safe going back to the place where you live?: (pt's sister would like for him to progress to an ALF after rehab)   Pt's sister would like for pt to go to an ALF after rehab  Need for Family Participation in Patient Care: Yes (Comment)(pt's oriented X 2) Care giver support system in place?: Yes (comment)   Criminal Activity/Legal Involvement Pertinent to Current Situation/Hospitalization: No - Comment as needed  Activities of Daily Living      Permission Sought/Granted Permission sought to share information with : Facility Automotive engineer, Family Supports Permission granted to share information with : Yes, Verbal Permission Granted  Share Information with NAME: Dashiel Bergquist  Permission granted to share info w AGENCY: ALF  Permission granted to share info w Relationship: Sister  Permission granted to share info w Contact Information: [phone]  Emotional Assessment Appearance:: (spoke with sister by telephone: pt disoriented X 2) Attitude/Demeanor/Rapport: Unable to Assess Affect (typically observed): Unable to Assess Orientation: : Oriented to  Time, Oriented to Situation Alcohol / Substance Use: Not Applicable Psych Involvement: No (comment)  Admission diagnosis:  Stroke (cerebrum) [facility]) [I63.9] Patient Active Problem List   Diagnosis Date Noted  . Pressure injury of skin 09/01/2018  . Stroke ([place]) 08/31/2018  . Internal carotid artery stenosis, right 08/31/2018  . Hypotension 05/26/2018  . Right carotid artery occlusion 05/26/2018  . History of completed stroke 05/26/2018  . Obesity 05/26/2018  . TIA (transient ischemic attack) 05/23/2018  . Cerebrovascular accident (CVA) due to occlusion of right carotid artery ([place])   . Cerebral thrombosis with cerebral infarction 07/20/2016  . Non-traumatic rhabdomyolysis   . Bronchitis 07/15/2016  . Dementia ([place]) 07/15/2016  . CAP (community acquired pneumonia) 07/15/2016  . Acute left hemiparesis ([place]) 07/15/2016  . Abnormal urinalysis 07/15/2016  . Effusion of left knee 07/15/2016  . Elevated AST (SGOT) 07/15/2016  . Elevated troponin 07/15/2016  . Lobar pneumonia ([place])   .  Prostate cancer ([place]) 10/21/2015  . Other seasonal allergic rhinitis 10/21/2015  . Essential hypertension 01/16/2014  . Bipolar 1 disorder ([place]) 08/02/2013  . GERD (gastroesophageal reflux disease) 07/04/2013  . Elevated PSA, less than 10 ng/ml 07/04/2013  . BPH with elevated PSA 02/28/2013  . Encounter for Medicare annual wellness exam 02/28/2013  . Hyperlipidemia  02/28/2013   PCP:  Colonel Bald, MD Pharmacy:   [id number] - [place], [place] - [address]. AT [address] [place] [address] Phone: [phone] Fax: [phone]     Social Determinants of Health (SDOH) Interventions    Readmission Risk Interventions No flowsheet data found.

## 2018-09-05 NOTE — Progress Notes (Signed)
Physical Therapy Treatment Patient Details Name: Ronald [name] MRN: [id number] DOB: 13-May-1945 Today's Date: 09/05/2018    History of Present Illness 73 yo male admitted after sudden onset lt sided weakness. Pt with rt MCA infarct and underwent TPA and angioplasty to rt ICA on 6/4. Intubated 6/4-6/6.  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 This session focused on gait training without use of AD. Pt requires mod A for balance. Continue to progress as tolerated.    Follow Up Recommendations  SNF     Equipment Recommendations  None recommended by PT    Recommendations for Other Services       Precautions / Restrictions Precautions Precautions: Fall Restrictions Weight Bearing Restrictions: No    Mobility  Bed Mobility               General bed mobility comments: pt in bathroom with OT upon arrival  Transfers Overall transfer level: Needs assistance Equipment used: Rolling walker (2 wheeled) Transfers: Sit to/from Stand Sit to Stand: Min guard         General transfer comment: close minguard for safety for sit<>stand; cues to place LUE on RW  Ambulation/Gait Ambulation/Gait assistance: Mod assist Gait Distance (Feet): 100 Feet Assistive device: 1 person hand held assist Gait Pattern/deviations: Step-to pattern;Decreased step length - left;Decreased weight shift to left     General Gait Details: assistance required for balance and weight shifting without use of AD   Stairs             Wheelchair Mobility    Modified Rankin (Stroke Patients Only)       Balance Overall balance assessment: Needs assistance Sitting-balance support: Feet supported;No upper extremity supported Sitting balance-Leahy Scale: Fair     Standing balance support: Single extremity supported;Bilateral upper  extremity supported;During functional activity Standing balance-Leahy Scale: Poor Standing balance comment: reliant on at least single UE support/minA at this time                            Cognition Arousal/Alertness: Awake/alert Behavior During Therapy: WFL for tasks assessed/performed Overall Cognitive Status: No family/caregiver present to determine baseline cognitive functioning Area of Impairment: Attention;Memory;Following commands;Safety/judgement;Problem solving;Awareness                   Current Attention Level: Sustained Memory: Decreased short-term memory Following Commands: Follows one step commands with increased time;Follows one step commands inconsistently Safety/Judgement: Decreased awareness of safety;Decreased awareness of deficits Awareness: Intellectual Problem Solving: Slow processing;Requires verbal cues;Requires tactile cues;Decreased initiation General Comments: pt with L inattention, pt with nonsensical speech throughout session, very easily distracted and requires frequent redirection; pt referring to himself as both an astronaut and a prophet during this session; pt is able to state he is in [facility] for a stroke, able to tell therapist about his L side weakness at start of session without asking; and aware of need to have BM verbalizing toileting needs      Exercises      General Comments        Pertinent Vitals/Pain Pain Assessment: Faces Faces Pain Scale: No hurt Pain Intervention(s): Monitored during session    Home Living                      Prior Function  PT Goals (current goals can now be found in the care plan section) Acute Rehab PT Goals Patient Stated Goal: go home Progress towards PT goals: Progressing toward goals    Frequency    Min 3X/week      PT Plan Current plan remains appropriate    Co-evaluation              AM-PAC PT "6 Clicks" Mobility   Outcome Measure  Help needed  turning from your back to your side while in a flat bed without using bedrails?: A Little Help needed moving from lying on your back to sitting on the side of a flat bed without using bedrails?: A Little Help needed moving to and from a bed to a chair (including a wheelchair)?: A Little Help needed standing up from a chair using your arms (e.g., wheelchair or bedside chair)?: A Little Help needed to walk in hospital room?: A Lot Help needed climbing 3-5 steps with a railing? : A Lot 6 Click Score: 16    End of Session Equipment Utilized During Treatment: Gait belt Activity Tolerance: Patient tolerated treatment well Patient left: in chair;with call bell/phone within reach;with chair alarm set Nurse Communication: Mobility status PT Visit Diagnosis: Unsteadiness on feet (R26.81);Other abnormalities of gait and mobility (R26.89);Hemiplegia and hemiparesis Hemiplegia - Right/Left: Left Hemiplegia - dominant/non-dominant: Non-dominant Hemiplegia - caused by: Cerebral infarction     Time: 1410-1430 PT Time Calculation (min) (ACUTE ONLY): 20 min  Charges:  $Gait Training: 8-22 mins                     Earney Navy, PTA Acute Rehabilitation Services Pager: [phone] Office: [phone]     Darliss Cheney 09/05/2018, 4:47 PM

## 2018-09-05 NOTE — Care Management Important Message (Signed)
Important Message  Patient Details  Name: Ronald [name] MRN: [id number] Date of Birth: 1945/09/11   Medicare Important Message Given:  Yes    [place] [address] 09/05/2018, 2:51 PM

## 2018-09-05 NOTE — Progress Notes (Addendum)
Inpatient Rehabilitation-Admissions Coordinator   [facility] continues to await call back from pt's brother regarding disposition. Pt continues to refuse need for CIR, stating he wants to go directly to [facility] (ALF). Without call from brother, [facility] unsure of baseline functioning and is therefore unable to determine if CIR appropriate at this time. AC will continue to follow.   Jhonnie Garner, OTR/L  Rehab Admissions Coordinator  [phone] 09/05/2018 10:42 AM  ADDENDUM: 3:06PM: Spoke with pt's sister Florian Buff) who reports Nanine Means will need clinical update to determine if the pt is able to DC straight to [facility]. Per Florian Buff, pt's [facility] representative is Haskel Khan (p): [phone]. AC has left voicemail for CM [employer] on the floor for further information. Pt is hopeful to return straight to [facility] instead of SNF vs CIR.  Will await assessment from [facility] as that is pt's first choice.   Jhonnie Garner, OTR/L  Rehab Admissions Coordinator  [phone] 09/05/2018 3:10 PM

## 2018-09-05 NOTE — Progress Notes (Signed)
STROKE TEAM PROGRESS NOTE   INTERVAL HISTORY Patient continues to make neurological improvement.  His speech is more clear today.  His left hemiparesis is also improving.  His blood pressure is adequately controlled.  He has seen by therapy who recommended rehabilitation.  The patient seems to prefer to go back to [facility] assisted [facility] facility where he is living  Vitals:   09/05/18 0328 09/05/18 0822 09/05/18 0901 09/05/18 1103  BP: (!) 146/69 (!) 185/85 (!) 178/97 (!) 162/68  Pulse: 60 70  64  Resp: 18 20  20   Temp: 98.7 F (37.1 C) 98.3 F (36.8 C)  98.4 F (36.9 C)  TempSrc: Oral Oral  Oral  SpO2: 100% 98%  100%  Weight:        CBC:  Recent Labs  Lab 08/31/18 1259  09/01/18 0406 09/01/18 0500  WBC 9.3  --   --  8.5  NEUTROABS 6.9  --   --  6.5  HGB 12.6*   < > 11.9* 11.5*  HCT 41.5   < > 35.0* 36.6*  MCV 93.5  --   --  92.0  PLT 236  --   --  216   < > = values in this interval not displayed.    Basic Metabolic Panel:  Recent Labs  Lab 09/01/18 0500 09/03/18 0726  NA 141 141  K 3.5 3.4*  CL 112* 108  CO2 20* 23  GLUCOSE 124* 104*  BUN 12 <5*  CREATININE 0.77 0.68  CALCIUM 8.3* 8.4*  MG 1.8 1.9  PHOS 3.0 2.8    IMAGING Ct Head Code Stroke Wo Contrast 08/31/2018 1. Stable non contrast CT appearance of the brain since February. Chronic right parietal lobe encephalomalacia. ASPECTS is 10.   Ct Angio Head W Or Wo Contrast Ct Angio Neck W Or Wo Contrast 08/31/2018 1. 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 Stable right ICA terminus and right MCA branches 2. However, there is new stenosis of the Right ACA A2, at least moderate. 3. Otherwise stable intracranial CTA findings since February, including: - chronic right vertebral artery occlusion with faint reconstitution at the skull base. -severe right PCA P1 stenosis.   Ct Cerebral Perfusion W  Contrast 08/31/2018 Largely unchanged CT perfusion abnormality in the right hemisphere since the February CTP, with actually slightly improved right hemisphere perfusion parameters in terms of CBF and the hypoperfusion index since that time. Electronically Signed   By: Genevie Ann M.D.   On: 08/31/2018 14:49   Cerebral Angio 08/31/2018 S/P bilateral common carotid arteriograms,followed by balloon angioplasty of occluded supraclinoid RT ICA with RT MCA TICI 3 revascularization  MR MRA Head Wo Contrast 09/01/2018 IMPRESSION: 1. Widespread gyriform restricted diffusion in the right hemisphere compatible with cortical ischemia. No associated hemorrhage or mass effect. No other areas of acute brain infarct.  2. Intracranial MRA reveals a diminutive but patent right ICA, although focal loss of flow signal in the supraclinoid segment raising the possibility of residual stenosis.  3. The visible right MCA and right ACA appear patent without stenosis - including the right A2 segment which was thought to be stenotic by CTA yesterday.  4. Poor flow in the right PCA, which demonstrated severe stenosis by CTA yesterday. There is some faint flow signal in the right vertebral artery V4 segment despite chronic right vertebral occlusion.  Transthoracic Echocardiogram  09/01/2018 IMPRESSIONS  1. The left ventricle has normal systolic function, with an ejection fraction of 55-60%. The cavity size was normal. There is mildly increased left ventricular wall thickness. Left ventricular diastolic Doppler parameters are consistent with impaired  relaxation.  2. The right ventricle has normal systolic function. The cavity was normal. There is no increase in right ventricular wall thickness.  3. Left atrial size was mildly dilated.  4. Trivial pericardial effusion is present.  5. The mitral valve is abnormal. Mild thickening of the mitral valve leaflet.  6. The aortic valve is tricuspid. Mild thickening of the aortic  valve. Aortic valve regurgitation is trivial by color flow Doppler.  7. The aortic root is normal in size and structure.  8. The inferior vena cava was normal in size with <50% respiratory variability.  9. The interatrial septum was not assessed.  Dg Chest Port 1 View 09/03/2018 Improved aeration in the right lung. Probable atelectasis in the  Right hilum    09/01/2018 Endotracheal tube in grossly good position. Nasogastric tube has been partially withdrawn, with distal tip in expected position of proximal thoracic esophagus; advancement is recommended. Mild right basilar subsegmental atelectasis.  08/31/2018 1. The endotracheal tube is in good position, 2.5 cm above the carina. 2. The NG tube could be advanced several cm. 3. No acute cardiopulmonary findings.    PHYSICAL EXAM    General -pleasant elderly   African-American male, not in distress  . Afebrile. Head is nontraumatic. Neck is supple without bruit.    Cardiac exam no murmur or gallop. Lungs are clear to auscultation. Distal pulses are well felt. Neurological Exam :  -he is awake alert oriented to time place and person.  He has mild dysarthria but can be understood.  He has right gaze preference, able to follow commands with gaze in all directions but right greater than left., following commands on the right hand and toe. Inconsistently blinking to visual threat on the left.  , able to track to right, PERRL. Corneal reflex present on the right but not on the left, left lower facial weakness.  Tongue midline in mouth. RUE spontaneous movement 4/5, able to follow commands with right hand. LUE 3/5. RLE 4/5    LLE 4/5 strength. DTR diminished and no babinski. Sensation, coordination not cooperative and gait not tested.    ASSESSMENT/PLAN [name] is a 73 y.o. male with history of stroke, schizophrenia, measles, hypertension, hyperlipidemia, depression, dementia presenting with L sided weakness, R gaze deviation and dysarthria.   Received IV tPA 08/31/2018 at 1320. Taken to IR with angioplasty and TICI3 of right MCA.  Stroke: R MCA infarct  s/p tPA and angioplasty R terminal ICA, infarct  secondary to large vessel disease    Code Stroke CT head No acute stroke. Old R parietal lobe encephalomalacia.  ASPECTS 10.     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 New stenosis R ACA A2.   CT perfusion large penumbra, essentially unchanged since Feb  Cerebral angio R terminal ICA angioplasty w. TICI 3 revascularization  Post IR CT no ICH, mass effect or shift  MRI -  Widespread gyriform restricted diffusion in the right hemisphere compatible with cortical ischemia.   MRA - diminutive but patent right ICA, although focal loss of flow signal in the supraclinoid segment raising the possibility of residual stenosis. Poor flow in the right PCA, which demonstrated severe stenosis by CTA yesterday.  There is some faint flow signal in the right vertebral artery V4 segment despite chronic right vertebral occlusion.  2D Echo  - EF 55-60%. No cardiac source of emboli identified.   LDL 53  HgbA1c 5.1  SCDs for VTE prophylaxis  clopidogrel 75 mg daily prior to admission, now on  aspirin 325 mg daily and clopidogrel 75 mg daily. Continue DAPT at d/c  X 3 months the PLAVIX alone and elective right ICA stenting in 4-6 weeks  Therapy recommendations:  SNF vs CIR   Disposition:  pending pt refusing SNF, with improvement may be good for CIR and / or return to ALF w/ HH. Rehab case manager following. Difficulty reaching next of kin to discuss  Respiratory Insufficiency, resolved  Intubated for IR  Tolerated extubation without difficulties  Right ICA occlusion  In 06/2016 CTA showed right ICA occlusion at ophthalmic level with distal reconstitution  04/2018 admission CTA showed right ICA occlusion at bifurcation, progressed from 06/2016  This admission  still with R ICA occlusion at ophthalmic level though some recannalization  S/p IR with terminal ICA angioplasty with TICI3 reperfusion  Avoid low BP  Plan R ICA stenting in 4-6 weeks  History of stroke/TIA  04/2018 TIA due to right ICA occlusion in the setting of hypotension. MRI neg. Found to have R ICA occlusion at origin, R ICA siphon occluded 2 years prior. On plavix. Home with family though difficulty to care for.  06/2016 admitted for a fall and difficulty walking. Also had a cough and fever due to left lower lobe PNA. MRI showed right MCA/ACA, MCA/PCA infarcts. CTA head neck showed right ICA occlusion at the ophthalmic level with distal reconstitution, right P1 stenosis and right VA occlusion. IR also confirmed right ICA occlusion, no intervention offered. LDL 86 and A1c 5.4, patient discharged with DAPT for 3 months and Lipitor 80.  Hypertension  Home meds:  Lisinopril 20 (Catapres and Metoprolol PTA ?) . Now on Lisinopril 10 mg daily  Stable  Hyperlipidemia  Home meds:  lipitor 80, resumed in hospital  LDL 53, goal < 70  Continue statin at discharge  Dysphagia, resolved  On D3 thin Diet   Other Stroke Risk Factors  Advanced age  Obesity, Body mass index is 33.67 kg/m., recommend weight loss, diet and exercise as appropriate   Other Active Problems  Dementia at baseline  Bipolar disorder  Prostate cancer  Hospital day # 5  Continue therapies and transfer to rehab when bed available after insurance approval   Antony Contras, MD  To contact Stroke Continuity provider, please refer to [email or web address] After hours, contact General Neurology

## 2018-09-05 NOTE — Progress Notes (Signed)
Nutrition Follow-up  RD working remotely.  DOCUMENTATION CODES:   Obesity unspecified  INTERVENTION:   - Ensure Enlive po BID, each supplement provides 350 kcal and 20 grams of protein  - MVI with minerals daily  NUTRITION DIAGNOSIS:   Inadequate oral intake related to inability to eat as evidenced by NPO status.  Progressing, pt now on Dysphagia 3 diet  GOAL:   Patient will meet greater than or equal to 90% of their needs  Progressing  MONITOR:   PO intake, Supplement acceptance, Skin, Labs, Weight trends  REASON FOR ASSESSMENT:   Ventilator    ASSESSMENT:   Pt with PMH of cancer, dementia, depression, HLD, HTN, schizophrenia now admitted with R ICA stroke s/p IR for balloon angioplasty.   6/06 - extubated, diet advanced to Dysphagia 1 with nectar-thick liqids 6/07 - MBS with recommendations for Dysphagia 3 with thin liquids  Noted therapies recommend CIR.  Attempted to speak with pt via phone call to room. Pt disoriented, stating he is an [place] at the [employer]. Phone call did not provide any useful information.  RD will order an oral nutrition supplement and daily MVI to aid pt in meeting kcal and protein needs.  Meal Completion: 90-100% x 2 meals  Medications reviewed and include: SSI, Protonix  Labs reviewed: potassium 3.4 CBG's: 88-139 x 24 hours  Diet Order:   Diet Order            DIET DYS 3 Room service appropriate? Yes; Fluid consistency: Thin  Diet effective now              EDUCATION NEEDS:   No education needs have been identified at this time  Skin:  Skin Assessment: Skin Integrity Issues: Stage I: L toe  Last BM:  09/02/18  Height:   Ht Readings from Last 1 Encounters:  05/23/18 5\' 7"  (1.702 m)    Weight:   Wt Readings from Last 1 Encounters:  08/31/18 97.5 kg    Ideal Body Weight:  67.2 kg  BMI:  Body mass index is 33.67 kg/m.  Estimated Nutritional Needs:   Kcal:  2000-2200  Protein:   100-115 grams  Fluid:  >/= 1.8 L    Gaynell Face, MS, RD, LDN Inpatient Clinical Dietitian Pager: [phone] Weekend/After Hours: [phone]

## 2018-09-05 NOTE — Evaluation (Addendum)
Speech Language Pathology Evaluation Patient Details Name: Ronald [name] MRN: [id number] DOB: Oct 09, 1945 Today's Date: 09/05/2018 Time: 8413-2440 SLP Time Calculation (min) (ACUTE ONLY): 30 min  Problem List:  Patient Active Problem List   Diagnosis Date Noted  . Pressure injury of skin 09/01/2018  . Stroke ([place]) 08/31/2018  . Internal carotid artery stenosis, right 08/31/2018  . Hypotension 05/26/2018  . Right carotid artery occlusion 05/26/2018  . History of completed stroke 05/26/2018  . Obesity 05/26/2018  . TIA (transient ischemic attack) 05/23/2018  . Cerebrovascular accident (CVA) due to occlusion of right carotid artery ([place])   . Cerebral thrombosis with cerebral infarction 07/20/2016  . Non-traumatic rhabdomyolysis   . Bronchitis 07/15/2016  . Dementia ([place]) 07/15/2016  . CAP (community acquired pneumonia) 07/15/2016  . Acute left hemiparesis ([place]) 07/15/2016  . Abnormal urinalysis 07/15/2016  . Effusion of left knee 07/15/2016  . Elevated AST (SGOT) 07/15/2016  . Elevated troponin 07/15/2016  . Lobar pneumonia ([place])   . Prostate cancer ([place]) 10/21/2015  . Other seasonal allergic rhinitis 10/21/2015  . Essential hypertension 01/16/2014  . Bipolar 1 disorder ([place]) 08/02/2013  . GERD (gastroesophageal reflux disease) 07/04/2013  . Elevated PSA, less than 10 ng/ml 07/04/2013  . BPH with elevated PSA 02/28/2013  . Encounter for Medicare annual wellness exam 02/28/2013  . Hyperlipidemia 02/28/2013   Past Medical History:  Past Medical History:  Diagnosis Date  . Cancer [facility])    Prostate  . Chicken pox   . Dementia ([place])   . Depression   . Heart disease   . Hyperlipemia   . Hypertension   . Measles   . Mumps   . 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 Stroke [facility])    Past Surgical History:  Past Surgical History:  Procedure Laterality Date  . IR ANGIO EXTERNAL CAROTID SEL EXT CAROTID UNI R MOD SED  07/20/2016  . IR ANGIO INTRA EXTRACRAN  SEL COM CAROTID INNOMINATE UNI L MOD SED  08/31/2018  . IR ANGIO INTRA EXTRACRAN SEL INTERNAL CAROTID BILAT MOD SED  07/20/2016  . IR ANGIO VERTEBRAL SEL SUBCLAVIAN INNOMINATE UNI L MOD SED  07/20/2016  . IR ANGIOGRAM EXTREMITY RIGHT  07/20/2016  . IR CT HEAD LTD  08/31/2018  . IR PERCUTANEOUS ART THROMBECTOMY/INFUSION INTRACRANIAL INC DIAG ANGIO  08/31/2018  . RADIOLOGY WITH ANESTHESIA N/A 08/31/2018   Procedure: IR WITH ANESTHESIA;  Surgeon: Radiologist, Medication, MD;  Location: [place];  Service: Radiology;  Laterality: N/A;   HPI:  Pt is a 73 y.o. male with history of stroke, schizophrenia, measles, hypertension, hyperlipidemia, depression, dementia. Patient was found at his home sitting in a chair when he suddenly slumped over to the left and was noted to be flaccid on the left with right gaze deviation along with dysarthric. CT of the head revealed stable since February and chronic right parietal lobe encephalomalacia. MRI of the brain has not been completed as yet.    Assessment / Plan / Recommendation Clinical Impression  SLP phoned pt's brother Barbaraann Rondo to establish baseline after his evaluation especially given pt diagnosis of schizophrenia and dementia.  MOCA unable to be conducted due to pt's inattention and verbosity.  He is able to verbalize his basic needs but demonstrates decreased awareness to his fall risk and needing others to assist.  Baseline mixed - spastic dysarthria present resulting in harsh voice/elevated pitch when pt is attempting to improve his intelligiblity.  Pt able to verbalize he saw SLP after prior CVA  and was informed to slow rate of speech.  Decreased attention to left noted without pt following directions to use hand to trace edge of tray to the left to locate his napkin despite tactile and verbal cues.  Per SlP conversation with pt's brother, his behaviors of verbosity and being "independent" thus not consistently following directions are baseline.    Barbaraann Rondo states he has  not spoken to his brother on the phone thus he can not comment on his speech.  Plan per brother is for pt to move to [facility] ALF.  Recommend brief follow up for dysphagia/dysarthria to maximize independence and effective expressive communication via improving articulation skills.  SLP informed Barbaraann Rondo of findings of testing and recommendations.        SLP Assessment  SLP Recommendation/Assessment: Patient needs continued Speech Lanaguage Pathology Services SLP Visit Diagnosis: Dysarthria and anarthria (R47.1)    Follow Up Recommendations  Home health SLP    Frequency and Duration min 1 x/week  1 week      SLP Evaluation Cognition      09/05/18 0410  Cognition  Overall Cognitive Status History of cognitive impairments - at baseline (per brother from interview pt appears to be at baseline)  Arousal/Alertness Awake/alert  Orientation Level Oriented to person;Oriented to place;Oriented to time (not aware of specific date)  Attention Sustained  Sustained Attention Impaired  Sustained Attention Impairment Functional basic  Memory Impaired  Memory Impairment Retrieval deficit (repeatedly asked staff's names)  Awareness Impaired  Awareness Impairment Intellectual impairment (pt states he can get up independently)  Problem Solving Impaired  Problem Solving Impairment Functional basic (to locate utencils on his tray)  Safety/Judgment Impaired (states can get up independently)        Comprehension  Auditory Comprehension Yes/No Questions: Not tested Commands: Within Functional Limits Interfering Components: Attention Visual Recognition/Discrimination Discrimination: Not tested Reading Comprehension Reading Status: Not tested    Expression Expression Primary Mode of Expression: Verbal Verbal Expression Overall Verbal Expression: (pt able to verbalize needs) Initiation: No impairment Level of Generative/Spontaneous Verbalization: Conversation Repetition: (dnt) Naming:  Not tested Pragmatics: Unable to assess(pt is verbose, at times does not demonstrate proper social skills) Interfering Components: Attention;Speech intelligibility Non-Verbal Means of Communication: Not applicable Written Expression Dominant Hand: Right(fed self with right hand) Written Expression: Not tested   Oral / Motor  Oral Motor/Sensory Function Overall Oral Motor/Sensory Function: Moderate impairment Facial ROM: Reduced left Facial Symmetry: Abnormal symmetry left Facial Strength: Reduced left Facial Sensation: Reduced left Lingual ROM: Reduced left Lingual Symmetry: Abnormal symmetry left Lingual Strength: Reduced Lingual Sensation: Reduced Motor Speech Overall Motor Speech: Impaired Respiration: Impaired Level of Impairment: Sentence(pt with decreased respiratory control resulting in harsh phonation) Phonation: Low vocal intensity Articulation: Impaired Level of Impairment: Phrase Motor Planning: Not tested Effective Techniques: Slow rate   GO                    [name] 09/05/2018, 9:51 AM  [name], MS [facility] SLP Acute Rehab Services Pager [phone] Office [phone]

## 2018-09-05 NOTE — Progress Notes (Signed)
Occupational Therapy Treatment Patient Details Name: Ronald [name] MRN: [id number] DOB: 01-19-1946 Today's Date: 09/05/2018    History of present illness 73 yo male admitted after sudden onset lt sided weakness. Pt with rt MCA infarct and underwent TPA and angioplasty to rt ICA on 6/4. Intubated 6/4-6/6.  PMH: dementia, depression, hypertension, hyperlipidemia, schizophrenia, stroke involving R carotid watershed areas with residual left sided weakness   OT comments  Pt presents seated in recliner agreeable to working with therapy. Pt requiring overall minA for room level functional mobility using RW, intermittent modA for maintaining LUE placement on RW, RW management as well as to avoid obstacles due to L inattention. Pt performing toileting and UB dressing with modA for task completion; minA for seated grooming ADL. Pt is oriented to self, location and current situation, however is very easily distracted and often with tangential/nonsensical speech. He requires frequent redirection to task at hand, cues for safety and to attend to L side/environment. Continue to recommend post acute therapy services at time of discharge (CIR vs SNF) to maximize his overall safety and independence with ADL and mobility. Will continue to follow acutely to progress pt towards established OT goals.    Follow Up Recommendations  CIR;Supervision/Assistance - 24 hour;SNF(CIR vs SNF)    Equipment Recommendations  Other (comment)(defer to next venue)          Precautions / Restrictions Precautions Precautions: Fall Restrictions Weight Bearing Restrictions: No       Mobility Bed Mobility               General bed mobility comments: Pt received OOB in recliner  Transfers Overall transfer level: Needs assistance Equipment used: Rolling walker (2 wheeled) Transfers: Sit to/from Stand Sit to Stand: Min guard         General transfer comment: close minguard for safety for sit<>stand; cues to place  LUE on RW    Balance Overall balance assessment: Needs assistance Sitting-balance support: Feet supported;No upper extremity supported Sitting balance-Leahy Scale: Fair     Standing balance support: Single extremity supported;Bilateral upper extremity supported;During functional activity Standing balance-Leahy Scale: Poor Standing balance comment: reliant on at least single UE support/minA at this time                           ADL either performed or assessed with clinical judgement   ADL Overall ADL's : Needs assistance/impaired     Grooming: Set up;Minimal assistance;Sitting;Wash/dry hands Grooming Details (indicate cue type and reason): providing hand sanitizer to clean hands after toileting         Upper Body Dressing : Moderate assistance;Sitting Upper Body Dressing Details (indicate cue type and reason): doffing/donning new gown; cues to use RUE to assist with LUE management for doffing/donning sleeve - pt very distracted requiring increased cues     Toilet Transfer: Minimal assistance;Moderate assistance;Ambulation;RW;BSC Toilet Transfer Details (indicate cue type and reason): BSC over toilet; overall minA intermittent modA as pt's L hand difficult to maintain on RW and pt also easily distracted - pt bumping into items on the L with RW Toileting- Clothing Manipulation and Hygiene: Moderate assistance;Sit to/from stand Toileting - Clothing Manipulation Details (indicate cue type and reason): assist for gown management; pt able to perform peri-care in standing after BM with minA for balance, assist for thoroughness; cues to continue with toileting task and to initiate standing for pericare due to distractibility      Functional mobility during ADLs: Minimal assistance;Moderate  assistance;Rolling walker General ADL Comments: pt continues to have impaired cognition, descreased attention to L side, L side weakness     Vision       Perception     Praxis       Cognition Arousal/Alertness: Awake/alert Behavior During Therapy: WFL for tasks assessed/performed Overall Cognitive Status: Impaired/Different from baseline Area of Impairment: Attention;Memory;Following commands;Safety/judgement;Problem solving;Awareness                   Current Attention Level: Sustained Memory: Decreased short-term memory Following Commands: Follows one step commands with increased time;Follows one step commands inconsistently Safety/Judgement: Decreased awareness of safety;Decreased awareness of deficits Awareness: Intellectual Problem Solving: Slow processing;Requires verbal cues;Requires tactile cues;Decreased initiation General Comments: pt with L inattention, pt with nonsensical speech throughout session, very easily distracted and requires frequent redirection; pt referring to himself as both an astronaut and a prophet during this session; pt is able to state he is in [facility] for a stroke, able to tell therapist about his L side weakness at start of session without asking; and aware of need to have BM verbalizing toileting needs        Exercises     Shoulder Instructions       General Comments      Pertinent Vitals/ Pain       Pain Assessment: Faces Faces Pain Scale: No hurt Pain Intervention(s): Monitored during session  Home Living                                          Prior Functioning/Environment              Frequency  Min 2X/week        Progress Toward Goals  OT Goals(current goals can now be found in the care plan section)  Progress towards OT goals: Progressing toward goals  Acute Rehab OT Goals Patient Stated Goal: go home OT Goal Formulation: With patient Time For Goal Achievement: 09/16/18 Potential to Achieve Goals: Good  Plan Discharge plan needs to be updated    Co-evaluation                 AM-PAC OT "6 Clicks" Daily Activity     Outcome Measure   Help from another person  eating meals?: A Lot Help from another person taking care of personal grooming?: A Lot Help from another person toileting, which includes using toliet, bedpan, or urinal?: A Lot Help from another person bathing (including washing, rinsing, drying)?: A Lot Help from another person to put on and taking off regular upper body clothing?: A Lot Help from another person to put on and taking off regular lower body clothing?: A Lot 6 Click Score: 12    End of Session Equipment Utilized During Treatment: Gait belt;Rolling walker  OT Visit Diagnosis: Unsteadiness on feet (R26.81);Other abnormalities of gait and mobility (R26.89);Muscle weakness (generalized) (M62.81);Other symptoms and signs involving cognitive function;Hemiplegia and hemiparesis Hemiplegia - Right/Left: Left Hemiplegia - dominant/non-dominant: Non-Dominant Hemiplegia - caused by: Cerebral infarction   Activity Tolerance Patient tolerated treatment well   Patient Left in chair;with call bell/phone within reach;with chair alarm set   Nurse Communication Mobility status        Time: 1350-1410 OT Time Calculation (min): 20 min  Charges: OT General Charges $OT Visit: 1 Visit OT Treatments $Self Care/Home Management : 8-22 mins  Lou Cal, OT Supplemental Rehabilitation  Services Pager [phone] Office [phone]    Raymondo Band 09/05/2018, 3:27 PM

## 2018-09-06 ENCOUNTER — Encounter (HOSPITAL_COMMUNITY): Payer: Self-pay | Admitting: Interventional Radiology

## 2018-09-06 LAB — GLUCOSE, CAPILLARY
Glucose-Capillary: 102 mg/dL — ABNORMAL HIGH (ref 70–99)
Glucose-Capillary: 105 mg/dL — ABNORMAL HIGH (ref 70–99)
Glucose-Capillary: 124 mg/dL — ABNORMAL HIGH (ref 70–99)
Glucose-Capillary: 127 mg/dL — ABNORMAL HIGH (ref 70–99)
Glucose-Capillary: 86 mg/dL (ref 70–99)
Glucose-Capillary: 90 mg/dL (ref 70–99)

## 2018-09-06 NOTE — Progress Notes (Addendum)
Inpatient Rehabilitation-Admissions Coordinator   Spoke to [facility] who states they will need clinical updates to accept the pt as he was never a confirmed resident Kathlee Nations with SW aware). Per [employer], they will need to assess him to see if they could even accept him after a rehab stay to see if his care needs can be met at their facility. Due to pt not having a confirmed DC plan at this time, [facility] would recommend SNF placement.   Jhonnie Garner, OTR/L  Rehab Admissions Coordinator  [phone] 09/06/2018 10:32 AM  ADDENDUM: 11:43AM: Spoke with pt's sister and brother who state he will have a backup if [facility] is unable to accept him after a short rehab stay. They would like to try for CIR. Pt in agreement. AC will begin insurance authorization process for possible admit.   Please call if questions.   Jhonnie Garner, OTR/L  Rehab Admissions Coordinator  [phone] 09/06/2018 11:44 AM

## 2018-09-06 NOTE — NC FL2 (Addendum)
[place] MEDICAID FL2 LEVEL OF CARE SCREENING TOOL     IDENTIFICATION  Patient Name: Ronald [name] Birthdate: 1945/07/20 Sex: male Admission Date (Current Location): 08/31/2018  [facility] and [place] Number:  Herbalist and Address:  [facility], [place] [address], [place], [place] [address]      Provider Number: [id number]  Attending Physician Name and Address:  Garvin Fila, MD  Relative Name and Phone Number:       Current Level of Care: Hospital Recommended Level of Care: [place] Prior Approval Number:    Date Approved/Denied:   PASRR Number:    Discharge Plan: Other (Comment)(ALF)    Current Diagnoses: Patient Active Problem List   Diagnosis Date Noted  . Pressure injury of skin 09/01/2018  . Stroke ([place]) 08/31/2018  . Internal carotid artery stenosis, right 08/31/2018  . Hypotension 05/26/2018  . Right carotid artery occlusion 05/26/2018  . History of completed stroke 05/26/2018  . Obesity 05/26/2018  . TIA (transient ischemic attack) 05/23/2018  . Cerebrovascular accident (CVA) due to occlusion of right carotid artery ([place])   . Cerebral thrombosis with cerebral infarction 07/20/2016  . Non-traumatic rhabdomyolysis   . Bronchitis 07/15/2016  . Dementia ([place]) 07/15/2016  . CAP (community acquired pneumonia) 07/15/2016  . Acute left hemiparesis ([place]) 07/15/2016  . Abnormal urinalysis 07/15/2016  . Effusion of left knee 07/15/2016  . Elevated AST (SGOT) 07/15/2016  . Elevated troponin 07/15/2016  . Lobar pneumonia ([place])   . Prostate cancer ([place]) 10/21/2015  . Other seasonal allergic rhinitis 10/21/2015  . Essential hypertension 01/16/2014  . Bipolar 1 disorder ([place]) 08/02/2013  . GERD (gastroesophageal reflux disease) 07/04/2013  . Elevated PSA, less than 10 ng/ml 07/04/2013  . BPH with elevated PSA 02/28/2013  . Encounter for Medicare annual wellness exam 02/28/2013  . Hyperlipidemia 02/28/2013     Orientation RESPIRATION BLADDER Height & Weight     Self, Situation, Place  Normal Continent Weight: 214 lb 15.2 oz (97.5 kg) Height:     BEHAVIORAL SYMPTOMS/MOOD NEUROLOGICAL BOWEL NUTRITION STATUS      Continent Diet(see DC Summary)  AMBULATORY STATUS COMMUNICATION OF NEEDS Skin   Extensive Assist Verbally PU Stage and Appropriate Care PU Stage 1 Dressing: No Dressing(left toe, no dressing)                     Personal Care Assistance Level of Assistance  Feeding, Dressing, Bathing Bathing Assistance: Limited assistance Feeding assistance: Limited assistance Dressing Assistance: Limited assistance     Functional Limitations Info  Sight, Hearing, Speech Sight Info: Adequate Hearing Info: Adequate Speech Info: Impaired    SPECIAL CARE FACTORS FREQUENCY  PT (By licensed PT), OT (By licensed OT)     PT Frequency: 3x/wk with home health OT Frequency: 3x/wk with home health            Contractures Contractures Info: Not present    Additional Factors Info  Code Status, Allergies, Insulin Sliding Scale Code Status Info: Full Allergies Info: Dust Mite Extract, Pollen Extract, Rye Grass Flower Pollen Extract Gramineae Pollens   Insulin Sliding Scale Info: 1-3 units every 4 hours       Current Medications (09/06/2018):  This is the current hospital active medication list Current Facility-Administered Medications  Medication Dose Route Frequency Provider Last Rate Last Dose  . acetaminophen (TYLENOL) tablet 650 mg  650 mg Oral Q4H PRN Garvin Fila, MD       Or  .  acetaminophen (TYLENOL) solution 650 mg  650 mg Per Tube Q4H PRN Garvin Fila, MD       Or  . acetaminophen (TYLENOL) suppository 650 mg  650 mg Rectal Q4H PRN Garvin Fila, MD      . aspirin tablet 325 mg  325 mg Oral Daily Garvin Fila, MD   325 mg at 09/06/18 1026   Or  . aspirin chewable tablet 324 mg  324 mg Per Tube Daily Garvin Fila, MD   324 mg at 09/02/18 0847  . atorvastatin  (LIPITOR) tablet 80 mg  80 mg Per Tube Daily Garvin Fila, MD   80 mg at 09/06/18 1026  . Chlorhexidine Gluconate Cloth 2 % PADS 6 each  6 each Topical Daily Garvin Fila, MD   6 each at 09/06/18 1029  . cloNIDine (CATAPRES) tablet 0.1 mg  0.1 mg Oral BID Garvin Fila, MD   0.1 mg at 09/06/18 1028  . clopidogrel (PLAVIX) tablet 75 mg  75 mg Oral Daily Garvin Fila, MD   75 mg at 09/06/18 1028   Or  . clopidogrel (PLAVIX) tablet 75 mg  75 mg Per Tube Daily Garvin Fila, MD   75 mg at 09/02/18 0847  . feeding supplement (ENSURE ENLIVE) (ENSURE ENLIVE) liquid 237 mL  237 mL Oral BID BM Garvin Fila, MD   237 mL at 09/06/18 1028  . hydrALAZINE (APRESOLINE) injection 10-20 mg  10-20 mg Intravenous Q6H PRN Garvin Fila, MD   20 mg at 09/03/18 1012  . insulin aspart (novoLOG) injection 1-3 Units  1-3 Units Subcutaneous Q4H Garvin Fila, MD   1 Units at 09/06/18 1200  . labetalol (NORMODYNE) injection 20 mg  20 mg Intravenous Q2H PRN Garvin Fila, MD   20 mg at 09/03/18 0920  . lisinopril (ZESTRIL) tablet 10 mg  10 mg Oral Daily Garvin Fila, MD   10 mg at 09/06/18 1028  . metoprolol tartrate (LOPRESSOR) tablet 50 mg  50 mg Oral BID Garvin Fila, MD   50 mg at 09/06/18 1027  . multivitamin with minerals tablet 1 tablet  1 tablet Oral Daily Garvin Fila, MD   1 tablet at 09/06/18 1027  . pantoprazole (PROTONIX) EC tablet 40 mg  40 mg Oral QHS Rumbarger, Valeda Malm, RPH   40 mg at 09/05/18 2229  . Resource ThickenUp Clear   Oral PRN Garvin Fila, MD      . senna-docusate (Senokot-S) tablet 1 tablet  1 tablet Oral QHS PRN Garvin Fila, MD         Discharge Medications: Please see discharge summary for a list of discharge medications.  Relevant Imaging Results:  Relevant Lab Results:   Additional Information SS#: [id number]  Geralynn Ochs, [place]  I have personally obtained history,examined this patient, reviewed notes, independently viewed imaging  studies, participated in medical decision making and plan of care.ROS completed by me personally and pertinent positives fully documented  I have made any additions or clarifications directly to the above note. Agree with note above.    Antony Contras, MD Medical Director [facility] Pager: [phone] 09/06/2018 5:00 PM

## 2018-09-06 NOTE — Progress Notes (Signed)
STROKE TEAM PROGRESS NOTE   INTERVAL HISTORY Patient continues to show neurological improvement.  He has most strength in his left upper extremity but left grip and finger still remains weak.  Inpatient rehab is not interested in patient we are awaiting insurance approval  Vitals:   09/06/18 0000 09/06/18 0408 09/06/18 0756 09/06/18 1027  BP: (!) 160/68 (!) 166/70 (!) 159/74 (!) 156/89  Pulse: 70 71 75 83  Resp: 18 18 20    Temp: 98.9 F (37.2 C) 98.5 F (36.9 C) (!) 97.4 F (36.3 C)   TempSrc: Oral Oral Oral   SpO2: 100% 100% 100%   Weight:        PHYSICAL EXAM     General -pleasant elderly   African-American male, not in distress Afebrile. Head is nontraumatic. Neck is supple without bruit.    Cardiac exam no murmur or gallop. Lungs are clear to auscultation. Distal pulses are well felt. Neurological Exam :  -he is awake alert oriented to time place and person.  He has mild dysarthria but can be understood.  He has right gaze preference, able to follow commands with gaze in all directions but right greater than left., following commands on the right hand and toe. Inconsistently blinking to visual threat on the left.  , able to track to right, PERRL. Corneal reflex present on the right but not on the left, left lower facial weakness.  Tongue midline in mouth. RUE spontaneous movement 4/5, able to follow commands with right hand. LUE 4/5.  Except distally where there is significant weakness of right grip and intrinsic hand muscles.  RLE 4/5    LLE 4/5 strength. DTR diminished and no babinski. Sensation, coordination not cooperative and gait not tested.    ASSESSMENT/PLAN Ronald [name] is a 73 y.o. male with history of stroke, schizophrenia, measles, hypertension, hyperlipidemia, depression, dementia presenting with L sided weakness, R gaze deviation and dysarthria.  Received IV tPA 08/31/2018 at 1320. Taken to IR with angioplasty and TICI3 of right MCA.  Stroke: R MCA infarct  s/p tPA  and angioplasty R terminal ICA, infarct  secondary to large vessel disease    Code Stroke CT head No acute stroke. Old R parietal lobe encephalomalacia.  ASPECTS 10.     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 New stenosis R ACA A2.   CT perfusion large penumbra, essentially unchanged since Feb  Cerebral angio R terminal ICA angioplasty w. TICI 3 revascularization  Post IR CT no ICH, mass effect or shift  MRI -  Widespread gyriform restricted diffusion in the right hemisphere compatible with cortical ischemia.   MRA - diminutive but patent right ICA, although focal loss of flow signal in the supraclinoid segment raising the possibility of residual stenosis. Poor flow in the right PCA, which demonstrated severe stenosis by CTA yesterday. There is some faint flow signal in the right vertebral artery V4 segment despite chronic right vertebral occlusion.  2D Echo  - EF 55-60%. No cardiac source of emboli identified.   LDL 53  HgbA1c 5.1  SCDs for VTE prophylaxis  clopidogrel 75 mg daily prior to admission, now on  aspirin 325 mg daily and clopidogrel 75 mg daily. Continue DAPT at d/c  X 3 months the PLAVIX alone and elective right ICA stenting in 4-6 weeks  Therapy recommendations:  SNF vs CIR  Disposition:  pending  with improvement may be good for  CIR and / or return to ALF w/ HH. Rehab case manager following. [facility] to assess for ALF return post CIR.   Respiratory Insufficiency, resolved  Intubated for IR  Tolerated extubation without difficulties  Right ICA occlusion  In 06/2016 CTA showed right ICA occlusion at ophthalmic level with distal reconstitution  04/2018 admission CTA showed right ICA occlusion at bifurcation, progressed from 06/2016  This admission still with R ICA occlusion at ophthalmic level though some recannalization  S/p IR with terminal ICA angioplasty with TICI3  reperfusion  Avoid low BP  Plan R ICA stenting in 4-6 weeks  History of stroke/TIA  04/2018 TIA due to right ICA occlusion in the setting of hypotension. MRI neg. Found to have R ICA occlusion at origin, R ICA siphon occluded 2 years prior. On plavix. Home with family though difficulty to care for.  06/2016 admitted for a fall and difficulty walking. Also had a cough and fever due to left lower lobe PNA. MRI showed right MCA/ACA, MCA/PCA infarcts. CTA head neck showed right ICA occlusion at the ophthalmic level with distal reconstitution, right P1 stenosis and right VA occlusion. IR also confirmed right ICA occlusion, no intervention offered. LDL 86 and A1c 5.4, patient discharged with DAPT for 3 months and Lipitor 80.  Hypertension  Home meds:  Lisinopril 20 (Catapres and Metoprolol PTA ?) . Now on Lisinopril 10 mg daily  Stable  Hyperlipidemia  Home meds:  lipitor 80, resumed in hospital  LDL 53, goal < 70  Continue statin at discharge  Dysphagia, resolved  On D3 thin Diet   Other Stroke Risk Factors  Advanced age  Obesity, Body mass index is 33.67 kg/m., recommend weight loss, diet and exercise as appropriate   Other Active Problems  Dementia at baseline  Bipolar disorder  Prostate cancer  Hospital day # 6  Continue ongoing physical therapy.  Patient medically stable for transfer to inpatient rehab when bed available after insurance approval.  Discussed with patient and case social worker and answered questions   Antony Contras, MD  To contact Stroke Continuity provider, please refer to [email or web address] After hours, contact General Neurology

## 2018-09-06 NOTE — Progress Notes (Addendum)
CSW received call from CIR Admissions that Ronald [name] is requesting updated clinicals to see about potential admission to ALF at discharge. CSW completed updated FL2 and faxed with updated PT, OT, and SLP notes.  CSW noting that plan is now to admit to CIR, pending insurance approval. CSW to continue to follow for possible SNF placement, if needed.  Laveda Abbe, [place] Clinical Social Worker [phone]  ADDENDUM:  Spoke with Cyril Mourning at [facility], confirmed that fax was received. Cyril Mourning indicated that, if patient admits to CIR, to update [facility] when there is about a week left before discharge so that they can prepare for ALF admission.  Laveda Abbe, [place] Clinical Social Worker [phone]

## 2018-09-07 LAB — GLUCOSE, CAPILLARY
Glucose-Capillary: 100 mg/dL — ABNORMAL HIGH (ref 70–99)
Glucose-Capillary: 118 mg/dL — ABNORMAL HIGH (ref 70–99)
Glucose-Capillary: 121 mg/dL — ABNORMAL HIGH (ref 70–99)
Glucose-Capillary: 79 mg/dL (ref 70–99)
Glucose-Capillary: 93 mg/dL (ref 70–99)
Glucose-Capillary: 93 mg/dL (ref 70–99)
Glucose-Capillary: 97 mg/dL (ref 70–99)
Glucose-Capillary: 98 mg/dL (ref 70–99)

## 2018-09-07 NOTE — Progress Notes (Signed)
Inpatient Rehabilitation-Admissions Coordinator   AC met with pt at the bedside to discuss insurance benefits and continue discussions regarding post acute therapy. Pt states he does not want to stay in the hospital because "the nurses are stupid." Pt continues to rant about his stay and wanting to leave. He proceeded to say "Im going to go home and get my (names specific gun...[employer]?) and going to come back and shoot all these nurses. Like a firing squad." Pt continued to perseverate on that topic. Pt again reiterated that he did not want to stay here for rehab. Pt was calm upon exit sitting in recliner with his belongings at his side. AC immediately discussed situation with CSW who is requesting psych consult and contacted attending service.   As pt adamantly refusing CIR, AC would recommend SNF.   Please call if questions.    , OTR/L  Rehab Admissions Coordinator  [phone] 09/07/2018 12:03 PM  

## 2018-09-07 NOTE — Progress Notes (Signed)
STROKE TEAM PROGRESS NOTE   INTERVAL HISTORY Patient remains neurologically stable and is now able to move a few of his fingers in his left hand.  The patient was verbally abusive to the rehab coordinator and threatened the nurses and stated he wanted to go home and get his gun to shoot everybody.  He has a history of schizophrenia but current medication list does not include any psychoactive medicines.  We plan to get a psych consult  Vitals:   09/07/18 0415 09/07/18 0700 09/07/18 0933 09/07/18 1121  BP: (!) 143/73 (!) 170/74 (!) 162/81 133/71  Pulse: (!) 59 63 84 71  Resp: 18 18  18   Temp: 98.5 F (36.9 C) 97.9 F (36.6 C)  98.6 F (37 C)  TempSrc: Oral Oral  Oral  SpO2: 100% 97%  100%  Weight:        PHYSICAL EXAM      General -pleasant elderly   African-American male, not in distress Afebrile. Head is nontraumatic. Neck is supple without bruit.    Cardiac exam no murmur or gallop. Lungs are clear to auscultation. Distal pulses are well felt. Neurological Exam :  -he is awake alert oriented to time place and person.  He has mild dysarthria but can be understood.  He has right gaze preference, able to follow commands with gaze in all directions but right greater than left., following commands on the right hand and toe. Inconsistently blinking to visual threat on the left.  , able to track to right, PERRL. Corneal reflex present on the right but not on the left, left lower facial weakness.  Tongue midline in mouth. RUE spontaneous movement 4/5, able to follow commands with right hand. LUE 4/5.  Except distally where there is significant weakness of right grip and intrinsic hand muscles.  RLE 4/5    LLE 4/5 strength. DTR diminished and no babinski. Sensation, coordination not cooperative and gait not tested.    ASSESSMENT/PLAN [name] is a 73 y.o. male with history of stroke, schizophrenia, measles, hypertension, hyperlipidemia, depression, dementia presenting with L sided  weakness, R gaze deviation and dysarthria.  Received IV tPA 08/31/2018 at 1320. Taken to IR with angioplasty and TICI3 of right MCA.  Stroke: R MCA infarct  s/p tPA and angioplasty R terminal ICA, infarct  secondary to large vessel disease    Code Stroke CT head No acute stroke. Old R parietal lobe encephalomalacia.  ASPECTS 10.     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 New stenosis R ACA A2.   CT perfusion large penumbra, essentially unchanged since Feb  Cerebral angio R terminal ICA angioplasty w. TICI 3 revascularization  Post IR CT no ICH, mass effect or shift  MRI -  Widespread gyriform restricted diffusion in the right hemisphere compatible with cortical ischemia.   MRA - diminutive but patent right ICA, although focal loss of flow signal in the supraclinoid segment raising the possibility of residual stenosis. Poor flow in the right PCA, which demonstrated severe stenosis by CTA yesterday. There is some faint flow signal in the right vertebral artery V4 segment despite chronic right vertebral occlusion.  2D Echo  - EF 55-60%. No cardiac source of emboli identified.   LDL 53  HgbA1c 5.1  SCDs for VTE prophylaxis  clopidogrel 75 mg daily prior to admission, now on  aspirin 325 mg daily and clopidogrel 75 mg daily. Continue DAPT  at d/c  X 3 months the PLAVIX alone and elective right ICA stenting in 4-6 weeks  Therapy recommendations:  SNF vs CIR   Disposition:  pending  with improvement may be good for CIR and / or return to ALF w/ HH. Rehab case manager following. [facility] has assessed and agreeable for return post CIR.    Respiratory Insufficiency, resolved  Intubated for IR  Tolerated extubation without difficulties  Right ICA occlusion  In 06/2016 CTA showed right ICA occlusion at ophthalmic level with distal reconstitution  04/2018 admission CTA showed right ICA occlusion at  bifurcation, progressed from 06/2016  This admission still with R ICA occlusion at ophthalmic level though some recannalization  S/p IR with terminal ICA angioplasty with TICI3 reperfusion  Avoid low BP  Plan R ICA stenting in 4-6 weeks  History of stroke/TIA  04/2018 TIA due to right ICA occlusion in the setting of hypotension. MRI neg. Found to have R ICA occlusion at origin, R ICA siphon occluded 2 years prior. On plavix. Home with family though difficulty to care for.  06/2016 admitted for a fall and difficulty walking. Also had a cough and fever due to left lower lobe PNA. MRI showed right MCA/ACA, MCA/PCA infarcts. CTA head neck showed right ICA occlusion at the ophthalmic level with distal reconstitution, right P1 stenosis and right VA occlusion. IR also confirmed right ICA occlusion, no intervention offered. LDL 86 and A1c 5.4, patient discharged with DAPT for 3 months and Lipitor 80.  Hypertension  Home meds:  Lisinopril 20 (Catapres and Metoprolol PTA ?) . Now on Lisinopril 10 mg daily  Stable  Hyperlipidemia  Home meds:  lipitor 80, resumed in hospital  LDL 53, goal < 70  Continue statin at discharge  Dysphagia, resolved  On D3 thin Diet   Other Stroke Risk Factors  Advanced age  Obesity, Body mass index is 33.67 kg/m., recommend weight loss, diet and exercise as appropriate   Other Active Problems  Dementia at baseline w/ behavioral disturbance today. Increasing ideas of grandeur past several days. Today threatening to go home and get his rifle, come back and shoot up the hospital. Discussed with SW and MD. On no listed meds PTA. Will get pysch consult.   Bipolar disorder  Prostate cancer  Hospital day # 7  Get psych consult to address his underlying schizophrenia and medication needs.   Antony Contras, MD  To contact Stroke Continuity provider, please refer to [email or web address] After hours, contact General Neurology

## 2018-09-07 NOTE — Progress Notes (Signed)
SLP Cancellation Note  Patient Details Name: Dietrich Samuelson MRN: [id number] DOB: [birth year]   Cancelled treatment:        Patient refused to work with therapy. Will attempt at next available date.    Charlynne Cousins Denham Mose, MA, CCC-SLP 09/07/2018 1:01 PM

## 2018-09-07 NOTE — Progress Notes (Signed)
STROKE TEAM PROGRESS NOTE   INTERVAL HISTORY Patient sitting up in a bedside chair.  Continues to show neurological improvement.  He has most strength in his left upper extremity but left grip and finger still remains weak.  He is able to bend a few of his fingers now.  We await disposition to rehab versus SNF. Vitals:   09/07/18 0415 09/07/18 0700 09/07/18 0933 09/07/18 1121  BP: (!) 143/73 (!) 170/74 (!) 162/81 133/71  Pulse: (!) 59 63 84 71  Resp: 18 18  18   Temp: 98.5 F (36.9 C) 97.9 F (36.6 C)  98.6 F (37 C)  TempSrc: Oral Oral  Oral  SpO2: 100% 97%  100%  Weight:        PHYSICAL EXAM     General -pleasant elderly   African-American male, not in distress Afebrile. Head is nontraumatic. Neck is supple without bruit.    Cardiac exam no murmur or gallop. Lungs are clear to auscultation. Distal pulses are well felt. Neurological Exam :  -he is awake alert oriented to time place and person.  He has mild dysarthria but can be understood.  He has right gaze preference, able to follow commands with gaze in all directions but right greater than left., following commands on the right hand and toe. Inconsistently blinking to visual threat on the left.  , able to track to right, PERRL. Corneal reflex present on the right but not on the left, left lower facial weakness.  Tongue midline in mouth. RUE spontaneous movement 4/5, able to follow commands with right hand. LUE 4/5.  Except distally where there is significant weakness of right grip and intrinsic hand muscles.  RLE 4/5    LLE 4/5 strength. DTR diminished and no babinski. Sensation, coordination not cooperative and gait not tested.    ASSESSMENT/PLAN [name] is a 73 y.o. male with history of stroke, schizophrenia, measles, hypertension, hyperlipidemia, depression, dementia presenting with L sided weakness, R gaze deviation and dysarthria.  Received IV tPA 08/31/2018 at 1320. Taken to IR with angioplasty and TICI3 of right  MCA.  Stroke: R MCA infarct  s/p tPA and angioplasty R terminal ICA, infarct  secondary to large vessel disease    Code Stroke CT head No acute stroke. Old R parietal lobe encephalomalacia.  ASPECTS 10.     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 New stenosis R ACA A2.   CT perfusion large penumbra, essentially unchanged since Feb  Cerebral angio R terminal ICA angioplasty w. TICI 3 revascularization  Post IR CT no ICH, mass effect or shift  MRI -  Widespread gyriform restricted diffusion in the right hemisphere compatible with cortical ischemia.   MRA - diminutive but patent right ICA, although focal loss of flow signal in the supraclinoid segment raising the possibility of residual stenosis. Poor flow in the right PCA, which demonstrated severe stenosis by CTA yesterday. There is some faint flow signal in the right vertebral artery V4 segment despite chronic right vertebral occlusion.  2D Echo  - EF 55-60%. No cardiac source of emboli identified.   LDL 53  HgbA1c 5.1  SCDs for VTE prophylaxis  clopidogrel 75 mg daily prior to admission, now on  aspirin 325 mg daily and clopidogrel 75 mg daily. Continue DAPT at d/c  X 3 months the PLAVIX alone and elective right ICA stenting in 4-6 weeks  Therapy recommendations:  SNF vs  CIR  Disposition:  pending  with improvement may be good for CIR and / or return to ALF w/ HH. Rehab case manager following. [facility] to assess for ALF return post CIR.   Respiratory Insufficiency, resolved  Intubated for IR  Tolerated extubation without difficulties  Right ICA occlusion  In 06/2016 CTA showed right ICA occlusion at ophthalmic level with distal reconstitution  04/2018 admission CTA showed right ICA occlusion at bifurcation, progressed from 06/2016  This admission still with R ICA occlusion at ophthalmic level though some recannalization  S/p IR with terminal ICA  angioplasty with TICI3 reperfusion  Avoid low BP  Plan R ICA stenting in 4-6 weeks  History of stroke/TIA  04/2018 TIA due to right ICA occlusion in the setting of hypotension. MRI neg. Found to have R ICA occlusion at origin, R ICA siphon occluded 2 years prior. On plavix. Home with family though difficulty to care for.  06/2016 admitted for a fall and difficulty walking. Also had a cough and fever due to left lower lobe PNA. MRI showed right MCA/ACA, MCA/PCA infarcts. CTA head neck showed right ICA occlusion at the ophthalmic level with distal reconstitution, right P1 stenosis and right VA occlusion. IR also confirmed right ICA occlusion, no intervention offered. LDL 86 and A1c 5.4, patient discharged with DAPT for 3 months and Lipitor 80.  Hypertension  Home meds:  Lisinopril 20 (Catapres and Metoprolol PTA ?) . Now on Lisinopril 10 mg daily  Stable  Hyperlipidemia  Home meds:  lipitor 80, resumed in hospital  LDL 53, goal < 70  Continue statin at discharge  Dysphagia, resolved  On D3 thin Diet   Other Stroke Risk Factors  Advanced age  Obesity, Body mass index is 33.67 kg/m., recommend weight loss, diet and exercise as appropriate   Other Active Problems  Dementia at baseline  Bipolar disorder  Prostate cancer  Hospital day # 7  Continue ongoing physical therapy.  Patient medically stable for transfer to inpatient rehab when bed available after insurance approval.  Discussed with patient and case social worker and answered questions   Antony Contras, MD  To contact Stroke Continuity provider, please refer to [email or web address] After hours, contact General Neurology

## 2018-09-07 NOTE — Discharge Summary (Addendum)
Stroke Discharge Summary  Patient ID: Ronald [name]   MRN: [id number]      DOB: [birth year]  Date of Admission: 08/31/2018 Date of Discharge: 09/14/2018  Attending Physician:  [name], MD, Stroke MD Consultant(s):   Jennet Maduro MD ( pulmonary/intensive care ), Willaim Rayas Nicole Kindred) Estanislado Pandy, MD (Interventional Neuroradiologist)  Patient's PCP:  Colonel Bald, MD  Discharge Diagnoses:  Principal Problem:   Stroke [facility]) - R MCA s/p tPA and angioplasty R MCA. Active Problems:   Hyperlipidemia   Bipolar 1 disorder (HCC)   Essential hypertension   Prostate cancer (HCC)   Dementia (HCC)   Right carotid artery occlusion   History of completed stroke   Obesity   Internal carotid artery stenosis, right   Pressure injury of skin   Paranoid schizophrenia ([place])  Past Medical History:  Diagnosis Date  . Cancer [facility])    Prostate  . Chicken pox   . Dementia ([place])   . Depression   . Heart disease   . Hyperlipemia   . Hypertension   . Measles   . Mumps   . 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 Stroke [facility])    Past Surgical History:  Procedure Laterality Date  . IR ANGIO EXTERNAL CAROTID SEL EXT CAROTID UNI R MOD SED  07/20/2016  . IR ANGIO INTRA EXTRACRAN SEL COM CAROTID INNOMINATE UNI L MOD SED  08/31/2018  . IR ANGIO INTRA EXTRACRAN SEL INTERNAL CAROTID BILAT MOD SED  07/20/2016  . IR ANGIO VERTEBRAL SEL SUBCLAVIAN INNOMINATE UNI L MOD SED  07/20/2016  . IR ANGIOGRAM EXTREMITY RIGHT  07/20/2016  . IR CT HEAD LTD  08/31/2018  . IR PERCUTANEOUS ART THROMBECTOMY/INFUSION INTRACRANIAL INC DIAG ANGIO  08/31/2018  . RADIOLOGY WITH ANESTHESIA N/A 08/31/2018   Procedure: IR WITH ANESTHESIA;  Surgeon: Radiologist, Medication, MD;  Location: [place];  Service: Radiology;  Laterality: N/A;    Medications to be continued on Rehab Allergies as of 09/14/2018      Reactions   Dust Mite Extract Other (See Comments)   Allergy symptoms   Pollen Extract Other (See Comments)    Allergy symptoms   Rye Grass Flower Pollen Extract [gramineae Pollens] Other (See Comments)   Allergy Symptoms      Medication List    STOP taking these medications   cloNIDine 0.1 MG tablet Commonly known as: CATAPRES     TAKE these medications   aspirin 325 MG EC tablet Take 1 tablet (325 mg total) by mouth daily. Start taking on: September 15, 2018   atorvastatin 80 MG tablet Commonly known as: LIPITOR Take 80 mg by mouth daily.   Calcium-Vitamin D 600-200 MG-UNIT tablet Take 1 tablet by mouth daily.   clopidogrel 75 MG tablet Commonly known as: PLAVIX Take 1 tablet (75 mg total) by mouth daily.   feeding supplement (ENSURE ENLIVE) Liqd Take 237 mLs by mouth 2 (two) times daily between meals.   fluticasone 50 MCG/ACT nasal spray Commonly known as: FLONASE Place 1 spray into both nostrils daily.   lisinopril 10 MG tablet Commonly known as: ZESTRIL Take 1 tablet (10 mg total) by mouth daily. Start taking on: September 15, 2018 What changed:   medication strength  how much to take   metoprolol tartrate 50 MG tablet Commonly known as: LOPRESSOR Take 1 tablet (50 mg total) by mouth 2 (two) times daily.       LABORATORY STUDIES CBC    Component Value Date/Time  WBC 7.2 09/08/2018 0545   RBC 4.19 (L) 09/08/2018 0545   HGB 11.9 (L) 09/08/2018 0545   HCT 38.7 (L) 09/08/2018 0545   PLT 287 09/08/2018 0545   MCV 92.4 09/08/2018 0545   MCH 28.4 09/08/2018 0545   MCHC 30.7 09/08/2018 0545   RDW 14.4 09/08/2018 0545   LYMPHSABS 1.2 09/01/2018 0500   MONOABS 0.7 09/01/2018 0500   EOSABS 0.0 09/01/2018 0500   BASOSABS 0.0 09/01/2018 0500   CMP    Component Value Date/Time   NA 137 09/08/2018 0545   K 4.4 09/08/2018 0545   CL 102 09/08/2018 0545   CO2 27 09/08/2018 0545   GLUCOSE 111 (H) 09/08/2018 0545   BUN 14 09/08/2018 0545   CREATININE 0.75 09/08/2018 0545   CREATININE 0.99 10/15/2013 0755   CALCIUM 9.4 09/08/2018 0545   PROT 7.1 08/31/2018 1259    ALBUMIN 3.6 08/31/2018 1259   AST 23 08/31/2018 1259   ALT 17 08/31/2018 1259   ALKPHOS 120 08/31/2018 1259   BILITOT 0.9 08/31/2018 1259   GFRNONAA >60 09/08/2018 0545   GFRAA >60 09/08/2018 0545   COAGS Lab Results  Component Value Date   INR 1.1 08/31/2018   INR 1.2 05/23/2018   INR 1.10 07/20/2016   Lipid Panel    Component Value Date/Time   CHOL 108 09/01/2018 0500   TRIG 112 09/02/2018 0634   HDL 30 (L) 09/01/2018 0500   CHOLHDL 3.6 09/01/2018 0500   VLDL 25 09/01/2018 0500   LDLCALC 53 09/01/2018 0500   HgbA1C  Lab Results  Component Value Date   HGBA1C 5.1 09/01/2018   Urinalysis    Component Value Date/Time   COLORURINE COLORLESS (A) 05/24/2018 1600   APPEARANCEUR CLEAR 05/24/2018 1600   LABSPEC 1.003 (L) 05/24/2018 1600   PHURINE 6.0 05/24/2018 1600   GLUCOSEU NEGATIVE 05/24/2018 1600   GLUCOSEU NEGATIVE 04/18/2015 0820   HGBUR NEGATIVE 05/24/2018 1600   BILIRUBINUR NEGATIVE 05/24/2018 1600   KETONESUR NEGATIVE 05/24/2018 1600   PROTEINUR NEGATIVE 05/24/2018 1600   UROBILINOGEN 0.2 04/18/2015 0820   NITRITE NEGATIVE 05/24/2018 1600   LEUKOCYTESUR NEGATIVE 05/24/2018 1600   Urine Drug Screen     Component Value Date/Time   LABOPIA NONE DETECTED 09/01/2018 1041   COCAINSCRNUR NONE DETECTED 09/01/2018 1041   LABBENZ NONE DETECTED 09/01/2018 1041   AMPHETMU NONE DETECTED 09/01/2018 1041   THCU NONE DETECTED 09/01/2018 1041   LABBARB NONE DETECTED 09/01/2018 1041    Alcohol Level    Component Value Date/Time   ETH <5 07/14/2016 1952     SIGNIFICANT DIAGNOSTIC STUDIES Ct Head Code Stroke Wo Contrast 08/31/2018 1. Stable non contrast CT appearance of the brain since February. Chronic right parietal lobe encephalomalacia. ASPECTS is 10.   Ct Angio Head W Or Wo Contrast Ct Angio Neck W Or Wo Contrast 08/31/2018 1. Improved cervical right ICA and right siphon since the CT in February, however there is persistent occlusion of the right ICA at the  ophthalmic artery origin. Stable right ICA terminus and right MCA branches 2. However, there is new stenosis of the Right ACA A2, at least moderate. 3. Otherwise stable intracranial CTA findings since February, including: - chronic right vertebral artery occlusion with faint reconstitution at the skull base. -severe right PCA P1 stenosis.   Ct Cerebral Perfusion W Contrast 08/31/2018 Largely unchanged CT perfusion abnormality in the right hemisphere since the February CTP, with actually slightly improved right hemisphere perfusion parameters in terms of CBF and the  hypoperfusion index since that time. Electronically Signed   By: Genevie Ann M.D.   On: 08/31/2018 14:49   Cerebral Angio 08/31/2018 S/P bilateral common carotid arteriograms,followed by balloon angioplasty of occluded supraclinoid RT ICA with RT MCA TICI 3 revascularization  MR MRA Head Wo Contrast 09/01/2018 IMPRESSION: 1. Widespread gyriform restricted diffusion in the right hemisphere compatible with cortical ischemia. No associated hemorrhage or mass effect. No other areas of acute brain infarct.  2. Intracranial MRA reveals a diminutive but patent right ICA, although focal loss of flow signal in the supraclinoid segment raising the possibility of residual stenosis.  3. The visible right MCA and right ACA appear patent without stenosis - including the right A2 segment which was thought to be stenotic by CTA yesterday.  4. Poor flow in the right PCA, which demonstrated severe stenosis by CTA yesterday. There is some faint flow signal in the right vertebral artery V4 segment despite chronic right vertebral occlusion.  Transthoracic Echocardiogram  09/01/2018 IMPRESSIONS 1. The left ventricle has normal systolic function, with an ejection fraction of 55-60%. The cavity size was normal. There is mildly increased left ventricular wall thickness. Left ventricular diastolic Doppler parameters are consistent with impaired   relaxation. 2. The right ventricle has normal systolic function. The cavity was normal. There is no increase in right ventricular wall thickness. 3. Left atrial size was mildly dilated. 4. Trivial pericardial effusion is present. 5. The mitral valve is abnormal. Mild thickening of the mitral valve leaflet. 6. The aortic valve is tricuspid. Mild thickening of the aortic valve. Aortic valve regurgitation is trivial by color flow Doppler. 7. The aortic root is normal in size and structure. 8. The inferior vena cava was normal in size with <50% respiratory variability. 9. The interatrial septum was not assessed.  Dg Chest Port 1 View 09/03/2018 Improved aeration in the right lung. Probable atelectasis in the  Right hilum    09/01/2018 Endotracheal tube in grossly good position. Nasogastric tube has been partially withdrawn, with distal tip in expected position of proximal thoracic esophagus; advancement is recommended. Mild right basilar subsegmental atelectasis.  08/31/2018 1. The endotracheal tube is in good position, 2.5 cm above the carina. 2. The NG tube could be advanced several cm. 3. No acute cardiopulmonary findings.      HISTORY OF PRESENT ILLNESS Ronald [name] is a 73 y.o. male with history of stroke, schizophrenia, measles, hypertension, hyperlipidemia, depression, dementia.  Patient was found at his home sitting in a chair when he suddenly slumped over to the left and was noted to be flaccid on the left with right gaze deviation along with dysarthric.  EMS was called and patient was immediately brought as a code stroke to [facility].  Patient is unable to give history at this time. Patient with recent admission 04/2018 with TIA d/t R ICA occlusion in setting of hypotension, told to avoid low BP w/ goal 130-160. He was LKW at 12:00 on 08/31/2018. Premorbid modified Rankin scale (mRS): 2. NIH stroke scale 21. IV tPA was administered. Take to IR where he had angioplasty but  no clot was found.   HOSPITAL COURSE Ronald [name] is a 73 y.o. male with history of stroke, schizophrenia, measles, hypertension, hyperlipidemia, depression, dementia presenting with L sided weakness, R gaze deviation and dysarthria.  Received IV tPA 08/31/2018 at 1320. Taken to IR with angioplasty and TICI3 of right MCA.  Stroke: R MCA infarct  s/p tPA and angioplasty R terminal ICA, infarct  secondary to large vessel disease    Code Stroke CT head No acute stroke. Old R parietal lobe encephalomalacia.  ASPECTS 10.     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 New stenosis R ACA A2.   CT perfusion large penumbra, essentially unchanged since Feb  Cerebral angio R terminal ICA angioplasty w. TICI 3 revascularization  Post IR CT no ICH, mass effect or shift  MRI -  Widespread gyriform restricted diffusion in the right hemisphere compatible with cortical ischemia.   MRA - diminutive but patent right ICA, although focal loss of flow signal in the supraclinoid segment raising the possibility of residual stenosis. Poor flow in the right PCA, which demonstrated severe stenosis by CTA yesterday. There is some faint flow signal in the right vertebral artery V4 segment despite chronic right vertebral occlusion.  2D Echo  - EF 55-60%. No cardiac source of emboli identified.   LDL 53  HgbA1c 5.1  clopidogrel 75 mg daily prior to admission, now on  aspirin 325 mg daily and clopidogrel 75 mg daily. Continue DAPT at d/c  X 3 months the PLAVIX alone and elective right ICA stenting in 4-6 weeks  Therapy recommendations:  CIR (insurance denied)  Disposition:  d/c to SNF   Covid neg on admission; repeat 6/17 also negative  Right ICA occlusion  In 06/2016 CTA showed right ICA occlusion at ophthalmic level with distal reconstitution  04/2018 admission CTA showed right ICA occlusion at bifurcation, progressed from  06/2016  This admission still with R ICA occlusion at ophthalmic level though some recannalization  S/p IR with terminal ICA angioplasty with TICI3 reperfusion  Avoid low BP  Plan R ICA stenting in 4-6 weeks  History of stroke/TIA  04/2018 TIA due to right ICA occlusion in the setting of hypotension. MRI neg. Found to have R ICA occlusion at origin, R ICA siphon occluded 2 years prior. On plavix. Home with family though difficulty to care for.  06/2016 admitted for a fall and difficulty walking. Also had a cough and fever due to left lower lobe PNA. MRI showed right MCA/ACA, MCA/PCA infarcts. CTA head neck showed right ICA occlusion at the ophthalmic level with distal reconstitution, right P1 stenosis and right VA occlusion. IR also confirmed right ICA occlusion, no intervention offered. LDL 86 and A1c 5.4, patient discharged with DAPT for 3 months and Lipitor 80.  Hypertension  Home meds:  Lisinopril 20 (Catapres and Metoprolol PTA ?)  Now on Lisinopril 10 mg daily, metoprolol 50 bid  D/c clonidine  BP stable on discharge  BP goal 130-150 given high grade stenosis of terminal R ICA s/p angioplast  Hyperlipidemia  Home meds:  lipitor 80, resumed in hospital  LDL 53, goal < 70  Continue statin at discharge  Dysphagia  On D3 thin Diet   Still has severe dysarthria  Other Stroke Risk Factors  Advanced age  Obesity, Body mass index is 33.67 kg/m., recommend weight loss, diet and exercise as appropriate   Other Active Problems  Dementia at baseline   Bipolar disorder  Prostate cancer  Stage 1 pressure injury bony prominence L great toe  Malnutrition d/t inadequate oral intake. On Ensure Enlive.  DISCHARGE EXAM Blood pressure 140/62, pulse 75, temperature 98.4 F (36.9 C), temperature source Oral, resp. rate 17, height 5\' 7"  (1.702 m), weight 97.5 kg, SpO2 99 %. General -pleasant elderly   African-American male, not in  distress Afebrile. Head is  nontraumatic. Neck is supple without bruit.    Cardiac exam no murmur or gallop. Lungs are clear to auscultation. Distal pulses are well felt. Neurological Exam: he is awake alert oriented to time place and person.  He has moderate dysarthria, improving. He has no gaze preference, and following all simple commands. Blinking to visual threat bilaterally, PERRL. Left lower facial droop and tongue midline. RUE and RLE 5/5, LUE 3-/5 proximal, bicep 4+/5, tricep and hand grip 3/5. LUE with increased tone. LLE 5/5 proximal and 4/5 distal. DTR 1+ and no babinski. Sensation symmetrical, coordination intact on the right but ataxic on the left, and gait not tested.  Discharge Diet  Dysphagia 3 thin liquids  DISCHARGE PLAN  Disposition:  Skilled nursing facility for ongoing PT, OT and ST.   aspirin 325 mg daily and clopidogrel 75 mg daily for secondary stroke prevention for a total of 3 months then PLAVIX alone.  Recommend ongoing stroke risk factor control by Primary Care Physician at time of discharge from inpatient rehabilitation.  Follow-up Paruchuri, Saunders Glance, MD or MD at [facility] for medical care.  Follow-up in [facility] in 4 weeks, office to schedule an appointment.   R ICA stent in 4-6 weeks   40 minutes were spent preparing discharge.  [name], MD PhD Stroke Neurology 09/14/2018 3:48 PM

## 2018-09-08 DIAGNOSIS — F29 Unspecified psychosis not due to a substance or known physiological condition: Secondary | ICD-10-CM

## 2018-09-08 DIAGNOSIS — F200 Paranoid schizophrenia: Secondary | ICD-10-CM

## 2018-09-08 LAB — BASIC METABOLIC PANEL
Anion gap: 8 (ref 5–15)
BUN: 14 mg/dL (ref 8–23)
CO2: 27 mmol/L (ref 22–32)
Calcium: 9.4 mg/dL (ref 8.9–10.3)
Chloride: 102 mmol/L (ref 98–111)
Creatinine, Ser: 0.75 mg/dL (ref 0.61–1.24)
GFR calc Af Amer: >60 mL/min (ref >60)
GFR calc non Af Amer: >60 mL/min (ref >60)
Glucose, Bld: 111 mg/dL — ABNORMAL HIGH (ref 70–99)
Potassium: 4.4 mmol/L (ref 3.5–5.1)
Sodium: 137 mmol/L (ref 135–145)

## 2018-09-08 LAB — CBC
HCT: 38.7 % — ABNORMAL LOW (ref 39.0–52.0)
Hemoglobin: 11.9 g/dL — ABNORMAL LOW (ref 13.0–17.0)
MCH: 28.4 pg (ref 26.0–34.0)
MCHC: 30.7 g/dL (ref 30.0–36.0)
MCV: 92.4 fL (ref 80.0–100.0)
Platelets: 287 10*3/uL (ref 150–400)
RBC: 4.19 MIL/uL — ABNORMAL LOW (ref 4.22–5.81)
RDW: 14.4 % (ref 11.5–15.5)
WBC: 7.2 10*3/uL (ref 4.0–10.5)
nRBC: 0 % (ref 0.0–0.2)

## 2018-09-08 LAB — GLUCOSE, CAPILLARY
Glucose-Capillary: 92 mg/dL (ref 70–99)
Glucose-Capillary: 67 mg/dL — ABNORMAL LOW (ref 70–99)
Glucose-Capillary: 77 mg/dL (ref 70–99)
Glucose-Capillary: 137 mg/dL — ABNORMAL HIGH (ref 70–99)
Glucose-Capillary: 69 mg/dL — ABNORMAL LOW (ref 70–99)
Glucose-Capillary: 95 mg/dL (ref 70–99)
Glucose-Capillary: 85 mg/dL (ref 70–99)

## 2018-09-08 NOTE — Progress Notes (Signed)
Physical Therapy Treatment Patient Details Name: Ronald [name] MRN: [id number] DOB: 1945-07-01 Today's Date: 09/08/2018    History of Present Illness 73 yo male admitted after sudden onset lt sided weakness. Pt with rt MCA infarct and underwent TPA and angioplasty to rt ICA on 6/4. Intubated 6/4-6/6.  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 Pt continues to demonstrate L side weakness, impaired balance, and cognitive deficits increasing risk for falls. Continue to progress as tolerated with anticipated d/c to SNF for further skilled PT services.      Follow Up Recommendations  SNF;Supervision/Assistance - 24 hour     Equipment Recommendations  None recommended by PT    Recommendations for Other Services       Precautions / Restrictions Precautions Precautions: Fall Restrictions Weight Bearing Restrictions: No    Mobility  Bed Mobility               General bed mobility comments: pt sitting EOB upon arrival  Transfers Overall transfer level: Needs assistance Equipment used: Rolling walker (2 wheeled) Transfers: Sit to/from Stand Sit to Stand: Min guard         General transfer comment: min guard for safety; pt impulsive to stand despite cues  Ambulation/Gait Ambulation/Gait assistance: Mod assist Gait Distance (Feet): 100 Feet Assistive device: Rolling walker (2 wheeled) Gait Pattern/deviations: Step-to pattern;Decreased step length - left;Decreased weight shift to left Gait velocity: decreased   General Gait Details: pt insistent on using RW; hand over hand assistance required to maintain L hand on RW and assistance required to guide RW and for balance; max directional cues required   Stairs             Wheelchair Mobility    Modified Rankin (Stroke Patients Only)        Balance Overall balance assessment: Needs assistance Sitting-balance support: Feet supported;No upper extremity supported Sitting balance-Leahy Scale: Fair     Standing balance support: During functional activity Standing balance-Leahy Scale: Poor                              Cognition Arousal/Alertness: Awake/alert Behavior During Therapy: WFL for tasks assessed/performed Overall Cognitive Status: No family/caregiver present to determine baseline cognitive functioning Area of Impairment: Attention;Memory;Following commands;Safety/judgement;Problem solving;Awareness                   Current Attention Level: Sustained Memory: Decreased short-term memory Following Commands: Follows one step commands with increased time;Follows one step commands inconsistently Safety/Judgement: Decreased awareness of safety;Decreased awareness of deficits Awareness: Intellectual Problem Solving: Slow processing;Requires verbal cues;Requires tactile cues;Decreased initiation;Difficulty sequencing        Exercises      General Comments General comments (skin integrity, edema, etc.): pt appeared to still be chewing food from breadfast upon arrival but would not open mouth to check; pt given water but still chewing and having trouble managing saliva       Pertinent Vitals/Pain Pain Assessment: No/denies pain    Home Living                      Prior Function            PT Goals (current goals can now be found in the care plan section) Acute Rehab PT Goals Patient Stated Goal: go home Progress  towards PT goals: Progressing toward goals    Frequency    Min 3X/week      PT Plan Current plan remains appropriate    Co-evaluation              AM-PAC PT "6 Clicks" Mobility   Outcome Measure  Help needed turning from your back to your side while in a flat bed without using bedrails?: A Little Help needed moving from lying on your back to sitting on  the side of a flat bed without using bedrails?: A Little Help needed moving to and from a bed to a chair (including a wheelchair)?: A Little Help needed standing up from a chair using your arms (e.g., wheelchair or bedside chair)?: A Little Help needed to walk in hospital room?: A Lot Help needed climbing 3-5 steps with a railing? : A Lot 6 Click Score: 16    End of Session Equipment Utilized During Treatment: Gait belt Activity Tolerance: Patient tolerated treatment well Patient left: in chair;with call bell/phone within reach;with chair alarm set Nurse Communication: Mobility status PT Visit Diagnosis: Unsteadiness on feet (R26.81);Other abnormalities of gait and mobility (R26.89);Hemiplegia and hemiparesis Hemiplegia - Right/Left: Left Hemiplegia - dominant/non-dominant: Non-dominant Hemiplegia - caused by: Cerebral infarction     Time: 2585-2778 PT Time Calculation (min) (ACUTE ONLY): 31 min  Charges:  $Gait Training: 23-37 mins                     Earney Navy, PTA Acute Rehabilitation Services Pager: [phone] Office: [phone]     Darliss Cheney 09/08/2018, 3:13 PM

## 2018-09-08 NOTE — Progress Notes (Signed)
STROKE TEAM PROGRESS NOTE   INTERVAL HISTORY Patient is sitting up in his bed.  He is working with therapist.  Patient states he is getting better and is able to bend his left hand more.  He states he does not want inpatient rehab at [facility].  He states his brother lives in [place] and would prefer to go to rehab there. . Vitals:   09/07/18 1949 09/07/18 2314 09/08/18 0415 09/08/18 0756  BP: (!) 168/64 140/63 (!) 143/77 (!) 153/59  Pulse: 68 69 62 66  Resp: 20 18 18 18   Temp: 98 F (36.7 C) (!) 97.5 F (36.4 C) (!) 97.4 F (36.3 C) 97.6 F (36.4 C)  TempSrc: Oral Oral Oral Oral  SpO2: 100% 100% 99% 100%  Weight:        PHYSICAL EXAM   General -pleasant elderly   African-American male, not in distress Afebrile. Head is nontraumatic. Neck is supple without bruit.    Cardiac exam no murmur or gallop. Lungs are clear to auscultation. Distal pulses are well felt. Neurological Exam :  -he is awake alert oriented to time place and person.  He has mild dysarthria but can be understood.  He has right gaze preference, able to follow commands with gaze in all directions but right greater than left., following commands on the right hand and toe. Inconsistently blinking to visual threat on the left.  , able to track to right, PERRL. Corneal reflex present on the right but not on the left, left lower facial weakness.  Tongue midline in mouth. RUE spontaneous movement 4/5, able to follow commands with right hand. LUE 4/5.  Except distally where there is significant weakness of right grip and intrinsic hand muscles.  RLE 4/5    LLE 4/5 strength. DTR diminished and no babinski. Sensation, coordination not cooperative and gait not tested.    ASSESSMENT/PLAN [name] is a 73 y.o. male with history of stroke, schizophrenia, measles, hypertension, hyperlipidemia, depression, dementia presenting with L sided weakness, R gaze deviation and dysarthria.  Received IV tPA 08/31/2018 at 1320. Taken to IR  with angioplasty and TICI3 of right MCA.  Stroke: R MCA infarct  s/p tPA and angioplasty R terminal ICA, infarct  secondary to large vessel disease    Code Stroke CT head No acute stroke. Old R parietal lobe encephalomalacia.  ASPECTS 10.     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 New stenosis R ACA A2.   CT perfusion large penumbra, essentially unchanged since Feb  Cerebral angio R terminal ICA angioplasty w. TICI 3 revascularization  Post IR CT no ICH, mass effect or shift  MRI -  Widespread gyriform restricted diffusion in the right hemisphere compatible with cortical ischemia.   MRA - diminutive but patent right ICA, although focal loss of flow signal in the supraclinoid segment raising the possibility of residual stenosis. Poor flow in the right PCA, which demonstrated severe stenosis by CTA yesterday. There is some faint flow signal in the right vertebral artery V4 segment despite chronic right vertebral occlusion.  2D Echo  - EF 55-60%. No cardiac source of emboli identified.   LDL 53  HgbA1c 5.1  SCDs for VTE prophylaxis  clopidogrel 75 mg daily prior to admission, now on  aspirin 325 mg daily and clopidogrel 75 mg daily. Continue DAPT at d/c  X 3 months the PLAVIX alone and elective right ICA stenting in  4-6 weeks  Therapy recommendations:  SNF   Disposition:  pending   SNF vs ALF with - insurance denied CIR plus pt is refusing to go. Case manager and SW following.   Medically ready for d/c when bed available  Respiratory Insufficiency, resolved  Intubated for IR  Tolerated extubation without difficulties  Right ICA occlusion  In 06/2016 CTA showed right ICA occlusion at ophthalmic level with distal reconstitution  04/2018 admission CTA showed right ICA occlusion at bifurcation, progressed from 06/2016  This admission still with R ICA occlusion at ophthalmic level though some  recannalization  S/p IR with terminal ICA angioplasty with TICI3 reperfusion  Avoid low BP  Plan R ICA stenting in 4-6 weeks  History of stroke/TIA  04/2018 TIA due to right ICA occlusion in the setting of hypotension. MRI neg. Found to have R ICA occlusion at origin, R ICA siphon occluded 2 years prior. On plavix. Home with family though difficulty to care for.  06/2016 admitted for a fall and difficulty walking. Also had a cough and fever due to left lower lobe PNA. MRI showed right MCA/ACA, MCA/PCA infarcts. CTA head neck showed right ICA occlusion at the ophthalmic level with distal reconstitution, right P1 stenosis and right VA occlusion. IR also confirmed right ICA occlusion, no intervention offered. LDL 86 and A1c 5.4, patient discharged with DAPT for 3 months and Lipitor 80.  Hypertension  Home meds:  Lisinopril 20 (Catapres and Metoprolol PTA ?) . Now on Lisinopril 10 mg daily  Stable  Hyperlipidemia  Home meds:  lipitor 80, resumed in hospital  LDL 53, goal < 70  Continue statin at discharge  Dysphagia, resolved  On D3 thin Diet   Other Stroke Risk Factors  Advanced age  Obesity, Body mass index is 33.67 kg/m., recommend weight loss, diet and exercise as appropriate   Other Active Problems  Dementia at baseline  Bipolar disorder  Prostate cancer  Hospital day # 8  Patient does not want to go to inpatient rehab at [facility] plus insurance company has denied that any help.  We will check with social worker to see if rehab been Ronald [name] is an option otherwise consider sending him back to [place] assisted living where he came from. Discussed with rehab coordinator and patient and social worker.  Greater than 50% time during this 25-minute visit was spent on counseling and coordination of care and discussion with care team  Ronald Contras, MD  To contact Stroke Continuity provider, please refer to [email or web address] After hours, contact General Neurology

## 2018-09-08 NOTE — Progress Notes (Addendum)
Inpatient Rehabilitation-Admissions Coordinator   Pt continues to adamantly refuse inpatient rehab here in the hospital. [facility] has received insurance denial for CIR and has notified pt of denial.   AC did call pt's sister to discuss denial as well as pt's requests to go to other places for rehab. Pt's sister is in agreement that if he does not want to stay in the hospital he may be more willing to work with therapists and staff at an outside facility for rehab. Pt's sister prefers [facility]. AC has communicated preferences to SW/CM.   Please call if questions.   Jhonnie Garner, OTR/L  Rehab Admissions Coordinator  [phone] 09/08/2018 8:50 AM

## 2018-09-08 NOTE — TOC Progression Note (Signed)
Transition of Care [facility]) - Progression Note    Patient Details  Name: Ronald [name] MRN: [id number] Date of Birth: 05-11-1945  Transition of Care [facility]) CM/SW [place], [place] Phone Number: 09/08/2018, 4:53 PM  Clinical Narrative:   CSW coordinated with CIR Admissions, and they will not be offering the patient a bed due to his behaviors at this time. Continue to await psych consult. Patient and sister would prefer SNF at [facility], if possible. CSW contacted Admissions at [facility] and sent referral. Awaiting response from [facility] admissions on whether they can offer a bed. Patient will need an authorization through [facility] prior to transition to SNF. CSW to continue to follow.     Expected Discharge Plan: Assisted Living Barriers to Discharge: Continued Medical Work up  Expected Discharge Plan and Services Expected Discharge Plan: Assisted Living       Living arrangements for the past 2 months: Single Family Home                   DME Agency: NA       HH Arranged: NA HH Agency: NA         Social Determinants of Health (SDOH) Interventions    Readmission Risk Interventions No flowsheet data found.

## 2018-09-08 NOTE — NC FL2 (Addendum)
[place] MEDICAID FL2 LEVEL OF CARE SCREENING TOOL     IDENTIFICATION  Patient Name: Ronald [name] Birthdate: 03-10-1946 Sex: male Admission Date (Current Location): 08/31/2018  [facility] and [place] Number:  Herbalist and Address:  [facility] [place] [place] [address] [place], [place] [address]      Provider Number: [id number]  Attending Physician Name and Address:  Garvin Fila, MD  Relative Name and Phone Number:       Current Level of Care: Hospital Recommended Level of Care: [place] Prior Approval Number:    Date Approved/Denied:   PASRR Number: Manual review  Discharge Plan: SNF    Current Diagnoses: Patient Active Problem List   Diagnosis Date Noted  . Pressure injury of skin 09/01/2018  . Stroke [facility]) - R MCA s/p tPA and angioplasty R MCA. 08/31/2018  . Internal carotid artery stenosis, right 08/31/2018  . Hypotension 05/26/2018  . Right carotid artery occlusion 05/26/2018  . History of completed stroke 05/26/2018  . Obesity 05/26/2018  . TIA (transient ischemic attack) 05/23/2018  . Cerebrovascular accident (CVA) due to occlusion of right carotid artery ([place])   . Cerebral thrombosis with cerebral infarction 07/20/2016  . Non-traumatic rhabdomyolysis   . Bronchitis 07/15/2016  . Dementia ([place]) 07/15/2016  . CAP (community acquired pneumonia) 07/15/2016  . Acute left hemiparesis ([place]) 07/15/2016  . Abnormal urinalysis 07/15/2016  . Effusion of left knee 07/15/2016  . Elevated AST (SGOT) 07/15/2016  . Elevated troponin 07/15/2016  . Lobar pneumonia ([place])   . Prostate cancer ([place]) 10/21/2015  . Other seasonal allergic rhinitis 10/21/2015  . Essential hypertension 01/16/2014  . Bipolar 1 disorder ([place]) 08/02/2013  . GERD (gastroesophageal reflux disease) 07/04/2013  . Elevated PSA, less than 10 ng/ml 07/04/2013  . BPH with elevated PSA 02/28/2013  . Encounter for Medicare annual wellness exam 02/28/2013  .  Hyperlipidemia 02/28/2013    Orientation RESPIRATION BLADDER Height & Weight     Self, Time, Situation, Place  Normal Incontinent, Continent(incontinent at times) Weight: 214 lb 15.2 oz (97.5 kg) Height:     BEHAVIORAL SYMPTOMS/MOOD NEUROLOGICAL BOWEL NUTRITION STATUS      Continent Diet(see DC Summary)  AMBULATORY STATUS COMMUNICATION OF NEEDS Skin   Limited Assist Verbally PU Stage and Appropriate Care PU Stage 1 Dressing: No Dressing(left toe)                     Personal Care Assistance Level of Assistance  Bathing, Dressing, Feeding Bathing Assistance: Limited assistance Feeding assistance: Limited assistance Dressing Assistance: Limited assistance     Functional Limitations Info  Sight, Hearing, Speech Sight Info: Adequate Hearing Info: Adequate Speech Info: Impaired(dysarthria)    SPECIAL CARE FACTORS FREQUENCY  PT (By licensed PT), OT (By licensed OT), Speech therapy     PT Frequency: 5x/wk OT Frequency: 5x/wk     Speech Therapy Frequency: 5x/wk      Contractures Contractures Info: Not present    Additional Factors Info  Code Status, Allergies, Insulin Sliding Scale Code Status Info: Full Allergies Info: Dust Mite Extract, Pollen Extract, Rye Grass Flower Pollen Extract Gramineae Pollens   Insulin Sliding Scale Info: 1-3 units every 4 hours       Current Medications (09/08/2018):  This is the current hospital active medication list Current Facility-Administered Medications  Medication Dose Route Frequency Provider Last Rate Last Dose  . acetaminophen (TYLENOL) tablet 650 mg  650 mg Oral Q4H PRN Garvin Fila, MD  Or  . acetaminophen (TYLENOL) solution 650 mg  650 mg Per Tube Q4H PRN Garvin Fila, MD       Or  . acetaminophen (TYLENOL) suppository 650 mg  650 mg Rectal Q4H PRN Garvin Fila, MD      . aspirin tablet 325 mg  325 mg Oral Daily Garvin Fila, MD   325 mg at 09/08/18 3244   Or  . aspirin chewable tablet 324 mg  324 mg  Per Tube Daily Garvin Fila, MD   324 mg at 09/02/18 0847  . atorvastatin (LIPITOR) tablet 80 mg  80 mg Per Tube Daily Garvin Fila, MD   80 mg at 09/08/18 0954  . Chlorhexidine Gluconate Cloth 2 % PADS 6 each  6 each Topical Daily Garvin Fila, MD   6 each at 09/08/18 [phone]  . cloNIDine (CATAPRES) tablet 0.1 mg  0.1 mg Oral BID Garvin Fila, MD   0.1 mg at 09/08/18 0954  . clopidogrel (PLAVIX) tablet 75 mg  75 mg Oral Daily Garvin Fila, MD   75 mg at 09/08/18 0954   Or  . clopidogrel (PLAVIX) tablet 75 mg  75 mg Per Tube Daily Garvin Fila, MD   75 mg at 09/02/18 0847  . feeding supplement (ENSURE ENLIVE) (ENSURE ENLIVE) liquid 237 mL  237 mL Oral BID BM Garvin Fila, MD   237 mL at 09/08/18 0958  . hydrALAZINE (APRESOLINE) injection 10-20 mg  10-20 mg Intravenous Q6H PRN Garvin Fila, MD   20 mg at 09/03/18 1012  . insulin aspart (novoLOG) injection 1-3 Units  1-3 Units Subcutaneous Q4H Garvin Fila, MD   1 Units at 09/07/18 2334  . labetalol (NORMODYNE) injection 20 mg  20 mg Intravenous Q2H PRN Garvin Fila, MD   20 mg at 09/03/18 0920  . lisinopril (ZESTRIL) tablet 10 mg  10 mg Oral Daily Garvin Fila, MD   10 mg at 09/08/18 0954  . metoprolol tartrate (LOPRESSOR) tablet 50 mg  50 mg Oral BID Garvin Fila, MD   50 mg at 09/08/18 0954  . multivitamin with minerals tablet 1 tablet  1 tablet Oral Daily Garvin Fila, MD   1 tablet at 09/08/18 0954  . pantoprazole (PROTONIX) EC tablet 40 mg  40 mg Oral QHS Rumbarger, Valeda Malm, RPH   40 mg at 09/07/18 2144  . Resource ThickenUp Clear   Oral PRN Garvin Fila, MD      . senna-docusate (Senokot-S) tablet 1 tablet  1 tablet Oral QHS PRN Garvin Fila, MD         Discharge Medications: Please see discharge summary for a list of discharge medications.  Relevant Imaging Results:  Relevant Lab Results:   Additional Information SS#: [id number]  Geralynn Ochs, LCSW  I have personally obtained  history,examined this patient, reviewed notes, independently viewed imaging studies, participated in medical decision making and plan of care.ROS completed by me personally and pertinent positives fully documented  I have made any additions or clarifications directly to the above note. Agree with note above.    Antony Contras, MD Medical Director [facility] Pager: [phone] 09/08/2018 1:02 PM

## 2018-09-08 NOTE — Progress Notes (Signed)
SLP Cancellation Note  Patient Details Name: Ronald [name] MRN: [id number] DOB: June 12, 1945   Cancelled treatment:       Reason Eval/Treat Not Completed: Medical issues which prohibited therapy(Pt eating and with nurse tech at this time. SLP will follow up on subsequent date.)  [name] I. Hardin Negus, [place], [place] Office number [phone] Pager [place] 09/08/2018, 5:02 PM

## 2018-09-08 NOTE — Consult Note (Addendum)
Telepsych Consultation   Reason for Consult:  Psychosis  Referring Physician:  Dr. Antony Contras Location of Patient: [facility] Location of Provider: [facility]  Patient Identification: Ronald [name] MRN:  [id number] Principal Diagnosis: Paranoid schizophrenia [facility]) Diagnosis:  Principal Problem:   Paranoid schizophrenia ([place]) Active Problems:   Hyperlipidemia   Bipolar 1 disorder ([place])   Essential hypertension   Prostate cancer ([place])   Dementia ([place])   Right carotid artery occlusion   History of completed stroke   Obesity   Stroke ([place]) - R MCA s/p tPA and angioplasty R MCA.   Internal carotid artery stenosis, right   Pressure injury of skin   Total Time spent with patient: 1 hour  Subjective:   Ronald [name] is a 73 y.o. male patient admitted with right MCA infarct.  HPI:   Per chart review, patient was admitted with right MCA infarct s/p tPA and angioplasty of right terminal ICA. His hospital course was complicated by respiratory insufficiency requiring intubation and dysphagia (resolved).  He is recommended for SNF placement due to risk of falls. He declines rehab and reports that his brother lives in [place] where he would prefer to go. Yesterday he reportedly became verbally abusive to the rehab coordinator. He threatened the nurses and stated he wanted to go home and get his gun to shoot everybody.   On interview, Mr. Carpenito reports that he is fine. He denies mood symptoms. He denies SI, HI or AVH. He reports, "Why would I do that?" when asked about HI. He reports that he would like to complete rehab to get stronger. He demonstrates movement in his upper extremities by moving his fingers. He is irritable at times when asked specific questions. He did not want to finish the interview and denied further concerns except wanting to go to a rehab.   Of note, patient was last seen at [facility] in 02/2018 for psychosis, agitation and poor self care in the  setting of poor medication compliance. He was restarted on Abilify and received the LAI at discharge.    Past Psychiatric History: Dementia, depression, BPAD and schizophrenia.   Risk to Self:  None. Denies SI.  Risk to Others:  None. Denies HI.  Prior Inpatient Therapy:  He was admitted to [facility] in 02/2018 for psychosis and agitation.   Prior Outpatient Therapy:  [facility].   Past Medical History:  Past Medical History:  Diagnosis Date  . Cancer [facility])    Prostate  . Chicken pox   . Dementia ([place])   . Depression   . Heart disease   . Hyperlipemia   . Hypertension   . Measles   . Mumps   . 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 Stroke [facility])     Past Surgical History:  Procedure Laterality Date  . IR ANGIO EXTERNAL CAROTID SEL EXT CAROTID UNI R MOD SED  07/20/2016  . IR ANGIO INTRA EXTRACRAN SEL COM CAROTID INNOMINATE UNI L MOD SED  08/31/2018  . IR ANGIO INTRA EXTRACRAN SEL INTERNAL CAROTID BILAT MOD SED  07/20/2016  . IR ANGIO VERTEBRAL SEL SUBCLAVIAN INNOMINATE UNI L MOD SED  07/20/2016  . IR ANGIOGRAM EXTREMITY RIGHT  07/20/2016  . IR CT HEAD LTD  08/31/2018  . IR PERCUTANEOUS ART THROMBECTOMY/INFUSION INTRACRANIAL INC DIAG ANGIO  08/31/2018  . RADIOLOGY WITH ANESTHESIA N/A 08/31/2018   Procedure: IR WITH ANESTHESIA;  Surgeon: Radiologist, Medication, MD;  Location: [place];  Service: Radiology;  Laterality: N/A;   Family History:  Family History  Problem Relation Age of Onset  . Hypertension Father   . Hyperlipidemia Father   . Congestive Heart Failure Father 53       Deceased-1995  . Diabetes Mother   . Kidney failure Mother 32       Deceased-2005  . Hypertension Mother   . Congestive Heart Failure Maternal Grandmother   . Heart failure Paternal Grandmother   . Heart attack Paternal Grandmother    Family Psychiatric  History: None per chart review.  Social History:  Social History   Substance and Sexual Activity  Alcohol Use No      Social History   Substance and Sexual Activity  Drug Use No    Social History   Socioeconomic History  . Marital status: Single    Spouse name: Not on file  . Number of children: 0  . Years of education: Not on file  . Highest education level: Not on file  Occupational History  . Not on file  Social Needs  . Financial resource strain: Not on file  . Food insecurity    Worry: Not on file    Inability: Not on file  . Transportation needs    Medical: Not on file    Non-medical: Not on file  Tobacco Use  . Smoking status: Never Smoker  . Smokeless tobacco: Never Used  Substance and Sexual Activity  . Alcohol use: No  . Drug use: No  . Sexual activity: Never  Lifestyle  . Physical activity    Days per week: Not on file    Minutes per session: Not on file  . Stress: Not on file  Relationships  . Social Herbalist on phone: Not on file    Gets together: Not on file    Attends religious service: Not on file    Active member of club or organization: Not on file    Attends meetings of clubs or organizations: Not on file    Relationship status: Not on file  Other Topics Concern  . Not on file  Social History Narrative   Lives at home w/ brother and his wife   Right-handed   Caffeine: occasional coffee or green tea   Additional Social History: N/A    Allergies:   Allergies  Allergen Reactions  . Dust Mite Extract Other (See Comments)    Allergy symptoms   . Pollen Extract Other (See Comments)    Allergy symptoms   . Rye Grass Flower Pollen Extract [Gramineae Pollens] Other (See Comments)    Allergy Symptoms     Labs:  Results for orders placed or performed during the hospital encounter of 08/31/18 (from the past 48 hour(s))  Glucose, capillary     Status: Abnormal   Collection Time: 09/06/18 11:46 AM  Result Value Ref Range   Glucose-Capillary 124 (H) 70 - 99 mg/dL   Comment 1 Notify RN    Comment 2 Document in Chart   Glucose, capillary      Status: Abnormal   Collection Time: 09/06/18  3:58 PM  Result Value Ref Range   Glucose-Capillary 127 (H) 70 - 99 mg/dL  Glucose, capillary     Status: Abnormal   Collection Time: 09/06/18  7:54 PM  Result Value Ref Range   Glucose-Capillary 102 (H) 70 - 99 mg/dL  Glucose, capillary     Status: None   Collection Time: 09/07/18 12:00 AM  Result Value Ref  Range   Glucose-Capillary 98 70 - 99 mg/dL  Glucose, capillary     Status: Abnormal   Collection Time: 09/07/18  4:12 AM  Result Value Ref Range   Glucose-Capillary 100 (H) 70 - 99 mg/dL  Glucose, capillary     Status: None   Collection Time: 09/07/18  8:16 AM  Result Value Ref Range   Glucose-Capillary 79 70 - 99 mg/dL  Glucose, capillary     Status: Abnormal   Collection Time: 09/07/18 11:18 AM  Result Value Ref Range   Glucose-Capillary 118 (H) 70 - 99 mg/dL  Glucose, capillary     Status: None   Collection Time: 09/07/18  4:45 PM  Result Value Ref Range   Glucose-Capillary 93 70 - 99 mg/dL  Glucose, capillary     Status: None   Collection Time: 09/07/18  4:56 PM  Result Value Ref Range   Glucose-Capillary 97 70 - 99 mg/dL   Comment 1 Notify RN    Comment 2 Document in Chart   Glucose, capillary     Status: None   Collection Time: 09/07/18  8:39 PM  Result Value Ref Range   Glucose-Capillary 93 70 - 99 mg/dL   Comment 1 Notify RN    Comment 2 Document in Chart   Glucose, capillary     Status: Abnormal   Collection Time: 09/07/18 11:11 PM  Result Value Ref Range   Glucose-Capillary 121 (H) 70 - 99 mg/dL   Comment 1 Notify RN    Comment 2 Document in Chart   Glucose, capillary     Status: None   Collection Time: 09/08/18  4:13 AM  Result Value Ref Range   Glucose-Capillary 92 70 - 99 mg/dL   Comment 1 Notify RN    Comment 2 Document in Chart   CBC     Status: Abnormal   Collection Time: 09/08/18  5:45 AM  Result Value Ref Range   WBC 7.2 4.0 - 10.5 K/uL   RBC 4.19 (L) 4.22 - 5.81 MIL/uL   Hemoglobin 11.9  (L) 13.0 - 17.0 g/dL   HCT 38.7 (L) 39.0 - 52.0 %   MCV 92.4 80.0 - 100.0 fL   MCH 28.4 26.0 - 34.0 pg   MCHC 30.7 30.0 - 36.0 g/dL   RDW 14.4 11.5 - 15.5 %   Platelets 287 150 - 400 K/uL   nRBC 0.0 0.0 - 0.2 %    Comment: Performed at [facility], [address] [place], [place] [address]  Basic metabolic panel     Status: Abnormal   Collection Time: 09/08/18  5:45 AM  Result Value Ref Range   Sodium 137 135 - 145 mmol/L   Potassium 4.4 3.5 - 5.1 mmol/L   Chloride 102 98 - 111 mmol/L   CO2 27 22 - 32 mmol/L   Glucose, Bld 111 (H) 70 - 99 mg/dL   BUN 14 8 - 23 mg/dL   Creatinine, Ser 0.75 0.61 - 1.24 mg/dL   Calcium 9.4 8.9 - 10.3 mg/dL   GFR calc non Af Amer >60 >60 mL/min   GFR calc Af Amer >60 >60 mL/min   Anion gap 8 5 - 15    Comment: Performed at [facility] [address], [place], [place] [address]  Glucose, capillary     Status: Abnormal   Collection Time: 09/08/18  7:48 AM  Result Value Ref Range   Glucose-Capillary 67 (L) 70 - 99 mg/dL  Glucose, capillary  Status: None   Collection Time: 09/08/18  9:07 AM  Result Value Ref Range   Glucose-Capillary 77 70 - 99 mg/dL    Medications:  Current Facility-Administered Medications  Medication Dose Route Frequency Provider Last Rate Last Dose  . acetaminophen (TYLENOL) tablet 650 mg  650 mg Oral Q4H PRN Garvin Fila, MD       Or  . acetaminophen (TYLENOL) solution 650 mg  650 mg Per Tube Q4H PRN Garvin Fila, MD       Or  . acetaminophen (TYLENOL) suppository 650 mg  650 mg Rectal Q4H PRN Garvin Fila, MD      . aspirin tablet 325 mg  325 mg Oral Daily Garvin Fila, MD   325 mg at 09/08/18 3825   Or  . aspirin chewable tablet 324 mg  324 mg Per Tube Daily Garvin Fila, MD   324 mg at 09/02/18 0847  . atorvastatin (LIPITOR) tablet 80 mg  80 mg Per Tube Daily Garvin Fila, MD   80 mg at 09/08/18 0954  . Chlorhexidine Gluconate Cloth 2 % PADS 6 each  6 each Topical Daily Garvin Fila, MD   6 each at 09/08/18 [id number]  . cloNIDine (CATAPRES) tablet 0.1 mg  0.1 mg Oral BID Garvin Fila, MD   0.1 mg at 09/08/18 0954  . clopidogrel (PLAVIX) tablet 75 mg  75 mg Oral Daily Garvin Fila, MD   75 mg at 09/08/18 0954   Or  . clopidogrel (PLAVIX) tablet 75 mg  75 mg Per Tube Daily Garvin Fila, MD   75 mg at 09/02/18 0847  . feeding supplement (ENSURE ENLIVE) (ENSURE ENLIVE) liquid 237 mL  237 mL Oral BID BM Garvin Fila, MD   237 mL at 09/08/18 0958  . hydrALAZINE (APRESOLINE) injection 10-20 mg  10-20 mg Intravenous Q6H PRN Garvin Fila, MD   20 mg at 09/03/18 1012  . insulin aspart (novoLOG) injection 1-3 Units  1-3 Units Subcutaneous Q4H Garvin Fila, MD   1 Units at 09/07/18 2334  . labetalol (NORMODYNE) injection 20 mg  20 mg Intravenous Q2H PRN Garvin Fila, MD   20 mg at 09/03/18 0920  . lisinopril (ZESTRIL) tablet 10 mg  10 mg Oral Daily Garvin Fila, MD   10 mg at 09/08/18 0954  . metoprolol tartrate (LOPRESSOR) tablet 50 mg  50 mg Oral BID Garvin Fila, MD   50 mg at 09/08/18 0954  . multivitamin with minerals tablet 1 tablet  1 tablet Oral Daily Garvin Fila, MD   1 tablet at 09/08/18 0954  . pantoprazole (PROTONIX) EC tablet 40 mg  40 mg Oral QHS Rumbarger, Valeda Malm, RPH   40 mg at 09/07/18 2144  . Resource ThickenUp Clear   Oral PRN Garvin Fila, MD      . senna-docusate (Senokot-S) tablet 1 tablet  1 tablet Oral QHS PRN Garvin Fila, MD        Musculoskeletal: Strength & Muscle Tone: Right MCA infarct with residual left sided weakness. Gait & Station: UTA since patient is sitting in bed. Patient leans: N/A  Psychiatric Specialty Exam: Physical Exam  Nursing note and vitals reviewed. Constitutional: He appears well-developed and well-nourished.  HENT:  Head: Normocephalic and atraumatic.  Neck: Normal range of motion.  Respiratory: Effort normal.  Musculoskeletal: Normal range of motion.     Comments: Left sided weakness and  decreased ROM in  upper extremity.  Neurological: He is alert.  Oriented to person and place.  Psychiatric: Judgment and thought content normal.    Review of Systems  Respiratory: Positive for cough.   Psychiatric/Behavioral: Negative for depression, hallucinations and suicidal ideas.  All other systems reviewed and are negative.   Blood pressure (!) 153/59, pulse 66, temperature 97.6 F (36.4 C), temperature source Oral, resp. rate 18, weight 97.5 kg, SpO2 100 %.Body mass index is 33.67 kg/m.  General Appearance: Fairly Groomed, elderly, African American male who is lying in bed. NAD.   Eye Contact:  Good  Speech:  Clear and Coherent but slurred at times.  Volume:  Normal  Mood:  Dysphoric and irritable at times.   Affect:  Constricted  Thought Process:  Linear and Descriptions of Associations: Intact but becomes tangential when providing details.   Orientation:  Other:  Oriented to person and place.  Thought Content:  Logical  Suicidal Thoughts:  No  Homicidal Thoughts:  No  Memory:  Immediate;   Fair Recent;   Fair Remote;   Fair  Judgement:  Fair  Insight:  Fair  Psychomotor Activity:  Normal  Concentration:  Concentration: Fair and Attention Span: Poor  Recall:  [employer] of Knowledge:  Fair  Language:  Fair  Akathisia:  No  Handed:  Right  AIMS (if indicated):   N/A  Assets:  Desire for Improvement Financial Resources/Insurance Housing Resilience Social Support  ADL's:  Impaired  Cognition: Impaired with short term memory deficits and difficulty with comprehending information.   Sleep:   N/A   Assessment:  Berlyn Malina is a 73 y.o. male who was admitted with right MCA infarct s/p tPA and angioplasty of right terminal ICA. Patient endorsed HI to staff after he became upset about the recommendation for CIR. He denies SI, HI or AVH today. He does not appear to be responding to internal stimuli. Per chart review, he has done well on Abilify in the past. Recommend  restarting Abilify for mood stabilization given intermittent mood lability. Patient does not warrant inpatient psychiatric hospitalization at this time.   Treatment Plan Summary: -Start Abilify 10 mg daily for mood stabilization. -Start Cogentin 1 mg qhs for EPS prophylaxis.  -Please obtain EKG to monitor for QTc prolongation.  -Patient should follow up with his outpatient provider for further medication management.  -Psychiatry will sign off on patient at this time. Please consult psychiatry again as needed.     Disposition: No evidence of imminent risk to self or others at present.   Patient does not meet criteria for psychiatric inpatient admission.  This service was provided via telemedicine using a 2-way, interactive audio and video technology.  Names of all persons participating in this telemedicine service and their role in this encounter. Name: Buford Dresser, DO Role: Psychiatrist  Name:Kenton Florek Role: Patient    Faythe Dingwall, DO 09/08/2018 10:03 AM

## 2018-09-09 DIAGNOSIS — F200 Paranoid schizophrenia: Secondary | ICD-10-CM

## 2018-09-09 DIAGNOSIS — C61 Malignant neoplasm of prostate: Secondary | ICD-10-CM

## 2018-09-09 LAB — GLUCOSE, CAPILLARY
Glucose-Capillary: 108 mg/dL — ABNORMAL HIGH (ref 70–99)
Glucose-Capillary: 117 mg/dL — ABNORMAL HIGH (ref 70–99)
Glucose-Capillary: 126 mg/dL — ABNORMAL HIGH (ref 70–99)
Glucose-Capillary: 139 mg/dL — ABNORMAL HIGH (ref 70–99)
Glucose-Capillary: 147 mg/dL — ABNORMAL HIGH (ref 70–99)
Glucose-Capillary: 80 mg/dL (ref 70–99)

## 2018-09-09 NOTE — Progress Notes (Signed)
STROKE TEAM PROGRESS NOTE   INTERVAL HISTORY Patient is lying in bed, still has left hemiparesis and dysarthria with left facial droop. His brother came in at the end of round. Pt denied CIR but OK with SNF.  Marland Kitchen Vitals:   09/08/18 2034 09/08/18 2039 09/09/18 0005 09/09/18 0405  BP:  (!) 109/97 (!) 141/72 134/61  Pulse:  63 62 (!) 56  Resp:  17 19 19   Temp:   97.8 F (36.6 C) 98.4 F (36.9 C)  TempSrc: Oral  Oral Oral  SpO2:  97% 100% 99%  Weight:        PHYSICAL EXAM   General -pleasant elderly   African-American male, not in distress Afebrile. Head is nontraumatic. Neck is supple without bruit.    Cardiac exam no murmur or gallop. Lungs are clear to auscultation. Distal pulses are well felt. Neurological Exam :  he is awake alert oriented to time place and person.  He has severe dysarthria with difficulty to be understood.  He has no gaze preference, and following all simple commands. Blinking to visual threat bilaterally, PERRL. Left lower facial droop and tongue midline. RUE and RLE 5/5, LUE 3-/5 proximal, bicep 4+/5, tricep and hand grip 3/5.  LLE 5/5 proximal and 4/5 distal. DTR 1+ and no babinski. Sensation symmetrical, coordination intact on the right but ataxic on the left, and gait not tested.    ASSESSMENT/PLAN [name] is a 73 y.o. male with history of stroke, schizophrenia, measles, hypertension, hyperlipidemia, depression, dementia presenting with L sided weakness, R gaze deviation and dysarthria.  Received IV tPA 08/31/2018 at 1320. Taken to IR with angioplasty and TICI3 of right MCA.  Stroke: R MCA infarct  s/p tPA and angioplasty R terminal ICA, infarct  secondary to large vessel disease    Code Stroke CT head No acute stroke. Old R parietal lobe encephalomalacia.  ASPECTS 10.     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 New stenosis R ACA A2.   CT perfusion large penumbra,  essentially unchanged since Feb  Cerebral angio R terminal ICA angioplasty w. TICI 3 revascularization  Post IR CT no ICH, mass effect or shift  MRI -  Widespread gyriform restricted diffusion in the right hemisphere compatible with cortical ischemia.   MRA - diminutive but patent right ICA, although focal loss of flow signal in the supraclinoid segment raising the possibility of residual stenosis. Poor flow in the right PCA, which demonstrated severe stenosis by CTA yesterday. There is some faint flow signal in the right vertebral artery V4 segment despite chronic right vertebral occlusion.  2D Echo  - EF 55-60%. No cardiac source of emboli identified.   LDL 53  HgbA1c 5.1  SCDs for VTE prophylaxis  clopidogrel 75 mg daily prior to admission, now on  aspirin 325 mg daily and clopidogrel 75 mg daily. Continue DAPT at d/c  X 3 months the PLAVIX alone and elective right ICA stenting in 4-6 weeks  Therapy recommendations:  SNF   Disposition:  Pending SNF vs ALF. Case manager and SW following.   Medically ready for d/c when bed available  Right ICA occlusion  In 06/2016 CTA showed right ICA occlusion at ophthalmic level with distal reconstitution  04/2018 admission CTA showed right ICA occlusion at bifurcation, progressed from 06/2016  This admission still with R ICA occlusion at ophthalmic level though some recannalization  S/p IR with terminal  ICA angioplasty with TICI3 reperfusion  Avoid low BP  Plan R ICA stenting in 4-6 weeks  History of stroke/TIA  04/2018 TIA due to right ICA occlusion in the setting of hypotension. MRI neg. Found to have R ICA occlusion at origin, R ICA siphon occluded 2 years prior. On plavix. Home with family though difficulty to care for.  06/2016 admitted for a fall and difficulty walking. Also had a cough and fever due to left lower lobe PNA. MRI showed right MCA/ACA, MCA/PCA infarcts. CTA head neck showed right ICA occlusion at the ophthalmic  level with distal reconstitution, right P1 stenosis and right VA occlusion. IR also confirmed right ICA occlusion, no intervention offered. LDL 86 and A1c 5.4, patient discharged with DAPT for 3 months and Lipitor 80.  Hypertension  Home meds:  Lisinopril 20 (Catapres and Metoprolol PTA ?) . Now on Lisinopril 10 mg daily  Stable  Hyperlipidemia  Home meds:  lipitor 80, resumed in hospital  LDL 53, goal < 70  Continue statin at discharge  Dysphagia  On D3 thin Diet   Still has severe dysarthria  Other Stroke Risk Factors  Advanced age  Obesity, Body mass index is 33.67 kg/m., recommend weight loss, diet and exercise as appropriate   Other Active Problems  Dementia at baseline  Bipolar disorder  Prostate cancer  Hospital day # 9  Ronald Hawking, MD PhD Stroke Neurology 09/09/2018 4:25 PM     To contact Stroke Continuity provider, please refer to [email or web address] After hours, contact General Neurology

## 2018-09-09 NOTE — Plan of Care (Signed)
  Problem: Nutrition: Goal: Adequate nutrition will be maintained Outcome: Progressing   

## 2018-09-09 NOTE — Plan of Care (Signed)
  Problem: Activity: Goal: Risk for activity intolerance will decrease Outcome: Progressing   

## 2018-09-10 LAB — GLUCOSE, CAPILLARY
Glucose-Capillary: 110 mg/dL — ABNORMAL HIGH (ref 70–99)
Glucose-Capillary: 128 mg/dL — ABNORMAL HIGH (ref 70–99)
Glucose-Capillary: 130 mg/dL — ABNORMAL HIGH (ref 70–99)
Glucose-Capillary: 133 mg/dL — ABNORMAL HIGH (ref 70–99)
Glucose-Capillary: 83 mg/dL (ref 70–99)
Glucose-Capillary: 87 mg/dL (ref 70–99)
Glucose-Capillary: 93 mg/dL (ref 70–99)

## 2018-09-10 MED ORDER — INSULIN ASPART 100 UNIT/ML ~~LOC~~ SOLN
1.0000 [IU] | Freq: Three times a day (TID) | SUBCUTANEOUS | Status: DC
  Administered 2018-09-12: 2 [IU] via SUBCUTANEOUS
  Administered 2018-09-13: 1 [IU] via SUBCUTANEOUS

## 2018-09-10 NOTE — Plan of Care (Signed)
  Problem: Nutrition: Goal: Adequate nutrition will be maintained Outcome: Adequate for Discharge   

## 2018-09-10 NOTE — Progress Notes (Signed)
STROKE TEAM PROGRESS NOTE   INTERVAL HISTORY Patient is sitting in bed, still has left hemiparesis more so on the left arm. Dysarthria is improving.  . Vitals:   09/09/18 1700 09/09/18 2015 09/10/18 0004 09/10/18 0430  BP: (!) 123/57 128/67 127/63 122/69  Pulse: 65 63 (!) 54 (!) 55  Resp: 17 20 20 18   Temp: 97.8 F (36.6 C) (!) 97.5 F (36.4 C) 98.3 F (36.8 C) 98 F (36.7 C)  TempSrc: Axillary   Oral  SpO2: 100% (!) 89% 100% 100%  Weight:        PHYSICAL EXAM   General -pleasant elderly   African-American male, not in distress Afebrile. Head is nontraumatic. Neck is supple without bruit.    Cardiac exam no murmur or gallop. Lungs are clear to auscultation. Distal pulses are well felt. Neurological Exam: he is awake alert oriented to time place and person.  He has moderate dysarthria, improving. He has no gaze preference, and following all simple commands. Blinking to visual threat bilaterally, PERRL. Left lower facial droop and tongue midline. RUE and RLE 5/5, LUE 3-/5 proximal, bicep 4+/5, tricep and hand grip 3/5. LUE with increased tone. LLE 5/5 proximal and 4/5 distal. DTR 1+ and no babinski. Sensation symmetrical, coordination intact on the right but ataxic on the left, and gait not tested.    ASSESSMENT/PLAN [name] is a 73 y.o. male with history of stroke, schizophrenia, measles, hypertension, hyperlipidemia, depression, dementia presenting with L sided weakness, R gaze deviation and dysarthria.  Received IV tPA 08/31/2018 at 1320. Taken to IR with angioplasty and TICI3 of right MCA.  Stroke: R MCA infarct  s/p tPA and angioplasty R terminal ICA, infarct  secondary to large vessel disease    Code Stroke CT head No acute stroke. Old R parietal lobe encephalomalacia.  ASPECTS 10.     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 New stenosis R ACA A2.   CT perfusion large penumbra,  essentially unchanged since Feb  Cerebral angio R terminal ICA angioplasty w. TICI 3 revascularization  Post IR CT no ICH, mass effect or shift  MRI -  Widespread gyriform restricted diffusion in the right hemisphere compatible with cortical ischemia.   MRA - diminutive but patent right ICA, although focal loss of flow signal in the supraclinoid segment raising the possibility of residual stenosis. Poor flow in the right PCA, which demonstrated severe stenosis by CTA yesterday. There is some faint flow signal in the right vertebral artery V4 segment despite chronic right vertebral occlusion.  2D Echo  - EF 55-60%. No cardiac source of emboli identified.   LDL 53  HgbA1c 5.1  SCDs for VTE prophylaxis  clopidogrel 75 mg daily prior to admission, now on  aspirin 325 mg daily and clopidogrel 75 mg daily. Continue DAPT at d/c  X 3 months the PLAVIX alone and elective right ICA stenting in 4-6 weeks  Therapy recommendations:  SNF   Disposition:  Pending SNF - Case manager and SW following.   Medically ready for d/c when bed available  Right ICA occlusion  In 06/2016 CTA showed right ICA occlusion at ophthalmic level with distal reconstitution  04/2018 admission CTA showed right ICA occlusion at bifurcation, progressed from 06/2016  This admission still with R ICA occlusion at ophthalmic level though some recannalization  S/p IR with terminal ICA angioplasty with TICI3 reperfusion  Avoid low BP  Plan R ICA stenting in 4-6 weeks  History of stroke/TIA  04/2018 TIA due to right ICA occlusion in the setting of hypotension. MRI neg. Found to have R ICA occlusion at origin, R ICA siphon occluded 2 years prior. On plavix. Home with family though difficulty to care for.  06/2016 admitted for a fall and difficulty walking. Also had a cough and fever due to left lower lobe PNA. MRI showed right MCA/ACA, MCA/PCA infarcts. CTA head neck showed right ICA occlusion at the ophthalmic level with  distal reconstitution, right P1 stenosis and right VA occlusion. IR also confirmed right ICA occlusion, no intervention offered. LDL 86 and A1c 5.4, patient discharged with DAPT for 3 months and Lipitor 80.  Hypertension  Home meds:  Lisinopril 20 (Catapres and Metoprolol PTA ?) . Now on Lisinopril 10 mg daily  Stable  Hyperlipidemia  Home meds:  lipitor 80, resumed in hospital  LDL 53, goal < 70  Continue statin at discharge  Dysphagia  On D3 thin Diet   Still has severe dysarthria  Other Stroke Risk Factors  Advanced age  Obesity, Body mass index is 33.67 kg/m., recommend weight loss, diet and exercise as appropriate   Other Active Problems  Dementia at baseline  Bipolar disorder  Prostate cancer  Hospital day # 10  [name], MD PhD Stroke Neurology 09/10/2018 4:38 PM    To contact Stroke Continuity provider, please refer to [email or web address] After hours, contact General Neurology

## 2018-09-10 NOTE — Progress Notes (Signed)
  Speech Language Pathology Treatment: Dysphagia  Patient Details Name: Ronald [name] MRN: [id number] DOB: 1945-05-04 Today's Date: 09/10/2018 Time: 5027-7412 SLP Time Calculation (min) (ACUTE ONLY): 19 min  Assessment / Plan / Recommendation Clinical Impression  Assisted pt with breakfast; able to feed himself with intermittent cues required to attend to left side, retention of solids left cheek. Cleaned oral suctioning per pt's request, clogged with solid food.  Pt verbalizing throughout meal, talking about being related to a list of [place] stars and musical artists, whom he named one after the other.  Unable to shift pt to new topic.  He tolerated breakfast, feeding himself after set-up, with only occasional cues for safety.  No s/s of aspiration.  SLP will follow breifly for swallowing/communication.    HPI HPI: Pt is a 73 y.o. male with history of stroke, schizophrenia, measles, hypertension, hyperlipidemia, depression, dementia. Patient was found at his home sitting in a chair when he suddenly slumped over to the left and was noted to be flaccid on the left with right gaze deviation along with dysarthric. CT of the head revealed stable since February and chronic right parietal lobe encephalomalacia. MRI of the brain has not been completed as yet.       SLP Plan  Continue with current plan of care       Recommendations  Diet recommendations: Dysphagia 3 (mechanical soft);Thin liquid Liquids provided via: Cup;Straw Medication Administration: Whole meds with puree Supervision: (tray set-up) Compensations: Slow rate;Small sips/bites;Lingual sweep for clearance of pocketing Postural Changes and/or Swallow Maneuvers: Seated upright 90 degrees                Oral Care Recommendations: Oral care BID Follow up Recommendations: Skilled Nursing facility SLP Visit Diagnosis: Dysphagia, oral phase (R13.11) Plan: Continue with current plan of care       GO                Anguel Delapena L. Tivis Ringer, [place] CCC/SLP Acute Rehabilitation Services Office number [phone] Pager [phone]  Juan Quam Laurice 09/10/2018, 9:40 AM

## 2018-09-11 ENCOUNTER — Encounter (HOSPITAL_COMMUNITY): Payer: Self-pay | Admitting: *Deleted

## 2018-09-11 DIAGNOSIS — F319 Bipolar disorder, unspecified: Secondary | ICD-10-CM

## 2018-09-11 LAB — GLUCOSE, CAPILLARY
Glucose-Capillary: 104 mg/dL — ABNORMAL HIGH (ref 70–99)
Glucose-Capillary: 95 mg/dL (ref 70–99)
Glucose-Capillary: 96 mg/dL (ref 70–99)
Glucose-Capillary: 100 mg/dL — ABNORMAL HIGH (ref 70–99)

## 2018-09-11 MED ORDER — ASPIRIN EC 325 MG PO TBEC
325.0000 mg | DELAYED_RELEASE_TABLET | Freq: Every day | ORAL | Status: DC
  Administered 2018-09-12 – 2018-09-14 (×3): 325 mg via ORAL
  Filled 2018-09-11 (×3): qty 1

## 2018-09-11 NOTE — Progress Notes (Signed)
Physical Therapy Treatment Patient Details Name: Ronald [name] MRN: [id number] DOB: [birth year] Today's Date: 09/11/2018    History of Present Illness 73 yo male admitted after sudden onset lt sided weakness. Pt with rt MCA infarct and underwent TPA and angioplasty to rt ICA on 6/4. Intubated 6/4-6/6.  PMH: dementia, depression, hypertension, hyperlipidemia, schizophrenia, stroke involving R carotid watershed areas with residual left sided weakness    PT Comments    Patient continues to present with L side weakness, decreased awareness of safety/deficits, and requires constant redirection to tasks during session. Pt insistent on using RW despite inability to maintain L hand grip on RW. Pt requires mod A for gait with and without use of AD. Pt pocketing food throughout session but denies having food in mouth. Pt will need 24 hour supervision/assistance given cognitive deficits and mobility level. Continue to progress as tolerated.    Follow Up Recommendations  SNF;Supervision/Assistance - 24 hour     Equipment Recommendations  None recommended by PT    Recommendations for Other Services       Precautions / Restrictions Precautions Precautions: Fall Restrictions Weight Bearing Restrictions: No    Mobility  Bed Mobility               General bed mobility comments: pt OOB in chair upon arrival  Transfers Overall transfer level: Needs assistance Equipment used: 1 person hand held assist Transfers: Sit to/from Stand Sit to Stand: Min guard         General transfer comment: pt impulsive to stand and requires single UE support upon standing for balance  Ambulation/Gait Ambulation/Gait assistance: Mod assist Gait Distance (Feet): 100 Feet Assistive device: Rolling walker (2 wheeled);1 person hand held assist Gait Pattern/deviations: Step-through pattern;Decreased step length - left;Decreased stride length Gait velocity: decreased   General Gait Details: initially pt  ambulating with HHA +1 around bed however insisted on using RW despite education that he is unable to maintain L UE grip on RW; pt requires constant cues for maintaining L hand on RW, assistance for balance and managing RW, and for redirection to task; pt with unsafe use of AD and L side inattention   Stairs             Wheelchair Mobility    Modified Rankin (Stroke Patients Only)       Balance Overall balance assessment: Needs assistance Sitting-balance support: Feet supported;No upper extremity supported Sitting balance-Leahy Scale: Fair     Standing balance support: During functional activity;Single extremity supported Standing balance-Leahy Scale: Poor Standing balance comment: reliant on at least single UE support/minA at this time                            Cognition Arousal/Alertness: Awake/alert Behavior During Therapy: WFL for tasks assessed/performed Overall Cognitive Status: History of cognitive impairments - at baseline Area of Impairment: Attention;Memory;Following commands;Safety/judgement;Problem solving;Awareness                   Current Attention Level: Sustained Memory: Decreased short-term memory Following Commands: Follows one step commands with increased time;Follows one step commands inconsistently Safety/Judgement: Decreased awareness of safety;Decreased awareness of deficits Awareness: Intellectual Problem Solving: Slow processing;Requires verbal cues;Requires tactile cues;Decreased initiation;Difficulty sequencing General Comments: L inattention noted, very poor attention throughout session- fixated on delusions and nonsensical statements. 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 He can recall his room number  Exercises      General Comments General comments (skin integrity, edema, etc.): pt pockets food in his mouth but denies having food in his  mouth during session despite food falling out of his mouth and then pt taking food out of his mouth end of session       Pertinent Vitals/Pain Pain Assessment: Faces Faces Pain Scale: No hurt    Home Living                      Prior Function            PT Goals (current goals can now be found in the care plan section) Acute Rehab PT Goals Patient Stated Goal: go home Progress towards PT goals: Progressing toward goals    Frequency    Min 3X/week      PT Plan Current plan remains appropriate    Co-evaluation PT/OT/SLP Co-Evaluation/Treatment: Yes Reason for Co-Treatment: To address functional/ADL transfers;For patient/therapist safety;Necessary to address cognition/behavior during functional activity PT goals addressed during session: Mobility/safety with mobility;Balance;Proper use of DME        AM-PAC PT "6 Clicks" Mobility   Outcome Measure  Help needed turning from your back to your side while in a flat bed without using bedrails?: A Little Help needed moving from lying on your back to sitting on the side of a flat bed without using bedrails?: A Little Help needed moving to and from a bed to a chair (including a wheelchair)?: A Little Help needed standing up from a chair using your arms (e.g., wheelchair or bedside chair)?: A Little Help needed to walk in hospital room?: A Lot Help needed climbing 3-5 steps with a railing? : A Lot 6 Click Score: 16    End of Session Equipment Utilized During Treatment: Gait belt Activity Tolerance: Patient tolerated treatment well Patient left: in chair;with call bell/phone within reach;with chair alarm set Nurse Communication: Mobility status PT Visit Diagnosis: Unsteadiness on feet (R26.81);Other abnormalities of gait and mobility (R26.89);Hemiplegia and hemiparesis Hemiplegia - Right/Left: Left Hemiplegia - dominant/non-dominant: Non-dominant Hemiplegia - caused by: Cerebral infarction     Time: 3382-5053 PT  Time Calculation (min) (ACUTE ONLY): 23 min  Charges:  $Gait Training: 8-22 mins                     Earney Navy, PTA Acute Rehabilitation Services Pager: [phone] Office: [phone]     Darliss Cheney 09/11/2018, 4:30 PM

## 2018-09-11 NOTE — Care Management Important Message (Signed)
Important Message  Patient Details  Name: Ronald [name] MRN: [id number] Date of Birth: 1945-10-03   Medicare Important Message Given:  Yes    Denai Caba Montine Circle 09/11/2018, 3:49 PM

## 2018-09-11 NOTE — TOC Progression Note (Signed)
Transition of Care [facility]) - Progression Note    Patient Details  Name: Ronald [name] MRN: [id number] Date of Birth: May 05, 1945  Transition of Care [facility]) CM/SW [place], [place] Phone Number: 09/11/2018, 5:33 PM  Clinical Narrative:   CSW following for discharge plan. CSW contacted [facility], they never received referral on Friday. CSW sent referral again. [facility] called back and they are unable to offer a bed for the patient due to his psych illness. CSW faxed out referral, and contacted sister Florian Buff to inform her. CSW emailed bed offers to [place]. She will research and determine another option tomorrow. CSW to follow.    Expected Discharge Plan: Assisted Living Barriers to Discharge: Continued Medical Work up  Expected Discharge Plan and Services Expected Discharge Plan: Assisted Living       Living arrangements for the past 2 months: Single Family Home                   DME Agency: NA       HH Arranged: NA HH Agency: NA         Social Determinants of Health (SDOH) Interventions    Readmission Risk Interventions No flowsheet data found.

## 2018-09-11 NOTE — Progress Notes (Signed)
STROKE TEAM PROGRESS NOTE   INTERVAL HISTORY Pt sitting in chair, earlier he was able to walk with walker in room for transition from bed to chair. Still has left arm weakness. CM is working for SNF.  Marland Kitchen Vitals:   09/11/18 0307 09/11/18 0725 09/11/18 1117 09/11/18 1118  BP: (!) 148/63 (!) 119/103 (!) 159/88 (!) 159/88  Pulse: 62 79  77  Resp: 17 17    Temp: 98.3 F (36.8 C) 97.7 F (36.5 C)  98 F (36.7 C)  TempSrc: Oral Oral  Oral  SpO2: 99% 98%  100%  Weight:        PHYSICAL EXAM  General -pleasant elderly   African-American male, not in distress Afebrile. Head is nontraumatic. Neck is supple without bruit.    Cardiac exam no murmur or gallop. Lungs are clear to auscultation. Distal pulses are well felt. Neurological Exam: he is awake alert oriented to time place and person.  He has moderate dysarthria, improving. He has no gaze preference, and following all simple commands. Blinking to visual threat bilaterally, PERRL. Left lower facial droop and tongue midline. RUE and RLE 5/5, LUE 3-/5 proximal, bicep 4+/5, tricep and hand grip 3/5. LUE with increased tone. LLE 5/5 proximal and 4/5 distal. DTR 1+ and no babinski. Sensation symmetrical, coordination intact on the right but ataxic on the left, and gait not tested.    ASSESSMENT/PLAN [name] is a 73 y.o. male with history of stroke, schizophrenia, measles, hypertension, hyperlipidemia, depression, dementia presenting with L sided weakness, R gaze deviation and dysarthria.  Received IV tPA 08/31/2018 at 1320. Taken to IR with angioplasty and TICI3 of right MCA.  Stroke: R MCA infarct  s/p tPA and angioplasty R terminal ICA, infarct  secondary to large vessel disease    Code Stroke CT head No acute stroke. Old R parietal lobe encephalomalacia.  ASPECTS 10.     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 New stenosis R ACA A2.   CT perfusion large  penumbra, essentially unchanged since Feb  Cerebral angio R terminal ICA angioplasty w. TICI 3 revascularization  Post IR CT no ICH, mass effect or shift  MRI -  Widespread gyriform restricted diffusion in the right hemisphere compatible with cortical ischemia.   MRA - diminutive but patent right ICA, although focal loss of flow signal in the supraclinoid segment raising the possibility of residual stenosis. Poor flow in the right PCA, which demonstrated severe stenosis by CTA yesterday. There is some faint flow signal in the right vertebral artery V4 segment despite chronic right vertebral occlusion.  2D Echo  - EF 55-60%. No cardiac source of emboli identified.   LDL 53  HgbA1c 5.1  SCDs for VTE prophylaxis  clopidogrel 75 mg daily prior to admission, now on  aspirin 325 mg daily and clopidogrel 75 mg daily. Continue DAPT at d/c  X 3 months the PLAVIX alone and elective right ICA stenting in 4-6 weeks  Therapy recommendations:  SNF   Disposition:  Pending SNF - Case manager and SW following.   Medically ready for d/c when bed available  Right ICA occlusion  In 06/2016 CTA showed right ICA occlusion at ophthalmic level with distal reconstitution  04/2018 admission CTA showed right ICA occlusion at bifurcation, progressed from 06/2016  This admission still with R ICA occlusion at ophthalmic level though some recannalization  S/p IR with terminal ICA angioplasty with  TICI3 reperfusion  Avoid low BP  Plan R ICA stenting in 4-6 weeks  History of stroke/TIA  04/2018 TIA due to right ICA occlusion in the setting of hypotension. MRI neg. Found to have R ICA occlusion at origin, R ICA siphon occluded 2 years prior. On plavix. Home with family though difficulty to care for.  06/2016 admitted for a fall and difficulty walking. Also had a cough and fever due to left lower lobe PNA. MRI showed right MCA/ACA, MCA/PCA infarcts. CTA head neck showed right ICA occlusion at the ophthalmic  level with distal reconstitution, right P1 stenosis and right VA occlusion. IR also confirmed right ICA occlusion, no intervention offered. LDL 86 and A1c 5.4, patient discharged with DAPT for 3 months and Lipitor 80.  Hypertension  Home meds:  Lisinopril 20 (Catapres and Metoprolol PTA ?) . Now on Lisinopril 10 mg daily  Stable  Hyperlipidemia  Home meds:  lipitor 80, resumed in hospital  LDL 53, goal < 70  Continue statin at discharge  Dysphagia  On D3 thin Diet   Still has severe dysarthria  Other Stroke Risk Factors  Advanced age  Obesity, Body mass index is 33.67 kg/m., recommend weight loss, diet and exercise as appropriate   Other Active Problems  Dementia at baseline  Bipolar disorder  Prostate cancer  Hospital day # 11  Ronald Hawking, MD PhD Stroke Neurology 09/11/2018 1:12 PM    To contact Stroke Continuity provider, please refer to [email or web address] After hours, contact General Neurology

## 2018-09-11 NOTE — Progress Notes (Signed)
Nutrition Follow-up  DOCUMENTATION CODES:   Obesity unspecified  INTERVENTION:  Continue Ensure Enlive po BID, each supplement provides 350 kcal and 20 grams of protein  Encourage adequate PO intake.   NUTRITION DIAGNOSIS:   Inadequate oral intake related to inability to eat as evidenced by NPO status; diet advanced; improving  GOAL:   Patient will meet greater than or equal to 90% of their needs; progressing  MONITOR:   PO intake, Supplement acceptance, Skin, Labs, Weight trends  REASON FOR ASSESSMENT:   Ventilator    ASSESSMENT:   Pt with PMH of cancer, dementia, depression, HLD, HTN, schizophrenia now admitted with R ICA stroke s/p IR for balloon angioplasty.   6/06 - extubated, diet advanced to Dysphagia 1 with nectar-thick liqids 6/07 - MBS with recommendations for Dysphagia 3 with thin liquids  Pt continues on a dysphagia 3 diet with thin liquids. Meal completion has been varied from 25-100% with 100% at breakfast this AM. Pt currently has Ensure ordered and has been consuming them. RD to continue with current orders to aid in caloric and protein needs. Labs and medications reviewed.   Diet Order:   Diet Order            DIET DYS 3 Room service appropriate? No; Fluid consistency: Thin  Diet effective now              EDUCATION NEEDS:   No education needs have been identified at this time  Skin:  Skin Assessment: Skin Integrity Issues: Skin Integrity Issues:: Stage I Stage I: L toe  Last BM:  6/15  Height:   Ht Readings from Last 1 Encounters:  05/23/18 5\' 7"  (1.702 m)    Weight:   Wt Readings from Last 1 Encounters:  08/31/18 97.5 kg    Ideal Body Weight:  67.2 kg  BMI:  Body mass index is 33.67 kg/m.  Estimated Nutritional Needs:   Kcal:  2000-2200  Protein:  100-115 grams  Fluid:  >/= 1.8 L    Corrin Parker, MS, RD, LDN Pager # [phone] After hours/ weekend pager # [phone]

## 2018-09-11 NOTE — Plan of Care (Signed)
  Problem: Nutrition: Goal: Adequate nutrition will be maintained Outcome: Not Progressing   

## 2018-09-11 NOTE — Progress Notes (Signed)
Occupational Therapy Treatment Patient Details Name: Ronald [name] MRN: [id number] DOB: 08-30-1945 Today's Date: 09/11/2018    History of present illness 73 yo male admitted after sudden onset lt sided weakness. Pt with rt MCA infarct and underwent TPA and angioplasty to rt ICA on 6/4. Intubated 6/4-6/6.  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 Pt eating lunch upon arrival, pocketing food despite cues from OT to clear mouth. Pt completing household level functional mobility at min-guard to min A level. He insists on using RW, although does not have ability to maintain LUE grip on RW. 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 Pt telling OT to get her money back from college for poor education. He refused any other ADL engagement despite offerings, but did stand to wash hands at min guard level- min A to engage LUE (pt refusing and leaving soap on hand and wiping soap off with paper towel. Recommend SNF and 24/7 supervision at d/c. Will continue to follow acutely.    Follow Up Recommendations  SNF;Supervision/Assistance - 24 hour    Equipment Recommendations  Other (comment)(defer)    Recommendations for Other Services      Precautions / Restrictions Precautions Precautions: Fall Restrictions Weight Bearing Restrictions: No       Mobility Bed Mobility               General bed mobility comments: up in chair  Transfers Overall transfer level: Needs assistance Equipment used: Rolling walker (2 wheeled) Transfers: Sit to/from Stand Sit to Stand: Min guard         General transfer comment: min guard for safety, impulsive despite education and cueing    Balance Overall balance assessment: Needs  assistance Sitting-balance support: Feet supported;No upper extremity supported Sitting balance-Leahy Scale: Fair     Standing balance support: During functional activity;Bilateral upper extremity supported Standing balance-Leahy Scale: Poor Standing balance comment: reliant on at least single UE support/minA at this time                           ADL either performed or assessed with clinical judgement   ADL Overall ADL's : Needs assistance/impaired Eating/Feeding: Set up;Sitting Eating/Feeding Details (indicate cue type and reason): continues to pocket food after eating, will deny food being present Grooming: Set up;Sitting;Wash/dry hands Grooming Details (indicate cue type and reason): cueing to incorporate LUE, pt choosing not to follow                             Functional mobility during ADLs: Minimal assistance;Rolling walker General ADL Comments: pt continues to have impaired cognition, descreased attention to L side, L side weakness     Vision Baseline Vision/History: Wears glasses Wears Glasses: Reading only     Perception     Praxis      Cognition Arousal/Alertness: Awake/alert Behavior During Therapy: WFL for tasks assessed/performed Overall Cognitive Status: No family/caregiver present to determine baseline cognitive functioning Area of Impairment: Attention;Memory;Following commands;Safety/judgement;Problem solving;Awareness                   Current Attention Level: Sustained Memory: Decreased short-term memory Following Commands: Follows one step commands with increased time;Follows one step commands inconsistently Safety/Judgement: Decreased awareness  of safety;Decreased awareness of deficits Awareness: Intellectual Problem Solving: Slow processing;Requires verbal cues;Requires tactile cues;Decreased initiation;Difficulty sequencing General Comments: L inattention noted, very poor attention throughout session- fixated on  delusions and nonsensical statements. 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 He can recall his room number        Exercises     Shoulder Instructions       General Comments      Pertinent Vitals/ Pain       Pain Assessment: No/denies pain  Home Living                                          Prior Functioning/Environment              Frequency  Min 2X/week        Progress Toward Goals  OT Goals(current goals can now be found in the care plan section)  Progress towards OT goals: Progressing toward goals  Acute Rehab OT Goals Patient Stated Goal: go home OT Goal Formulation: With patient Time For Goal Achievement: 09/16/18 Potential to Achieve Goals: Good  Plan Discharge plan needs to be updated    Co-evaluation                 AM-PAC OT "6 Clicks" Daily Activity     Outcome Measure   Help from another person eating meals?: A Little Help from another person taking care of personal grooming?: A Little Help from another person toileting, which includes using toliet, bedpan, or urinal?: A Lot Help from another person bathing (including washing, rinsing, drying)?: A Lot Help from another person to put on and taking off regular upper body clothing?: A Lot Help from another person to put on and taking off regular lower body clothing?: A Lot 6 Click Score: 14    End of Session Equipment Utilized During Treatment: Gait belt;Rolling walker  OT Visit Diagnosis: Unsteadiness on feet (R26.81);Other abnormalities of gait and mobility (R26.89);Muscle weakness (generalized) (M62.81);Other symptoms and signs involving cognitive function;Hemiplegia and hemiparesis Hemiplegia - Right/Left: Left Hemiplegia - dominant/non-dominant: Non-Dominant Hemiplegia - caused by: Cerebral infarction   Activity Tolerance Patient tolerated treatment well   Patient Left in chair;with call  bell/phone within reach;with chair alarm set   Nurse Communication Mobility status        Time: 4132-4401 OT Time Calculation (min): 22 min  Charges: OT General Charges $OT Visit: 1 Visit OT Treatments $Self Care/Home Management : 8-22 mins  Zenovia Jarred, MSOT, OTR/L [facility] Office: [phone]   Zenovia Jarred 09/11/2018, 3:54 PM

## 2018-09-12 LAB — GLUCOSE, CAPILLARY
Glucose-Capillary: 105 mg/dL — ABNORMAL HIGH (ref 70–99)
Glucose-Capillary: 82 mg/dL (ref 70–99)
Glucose-Capillary: 112 mg/dL — ABNORMAL HIGH (ref 70–99)
Glucose-Capillary: 170 mg/dL — ABNORMAL HIGH (ref 70–99)

## 2018-09-12 NOTE — Progress Notes (Signed)
STROKE TEAM PROGRESS NOTE   INTERVAL HISTORY Patient sitting in chair for lunch.  Neuro stable.  No complaints.  CM and SW are working on placement.  . Vitals:   09/11/18 2319 09/12/18 0329 09/12/18 0700 09/12/18 1100  BP: (!) 133/40 103/85 (!) 143/68 128/67  Pulse: (!) 56 61 70 72  Resp: 18 20 19 18   Temp: 97.8 F (36.6 C) 98.1 F (36.7 C) 97.7 F (36.5 C) 97.8 F (36.6 C)  TempSrc: Oral Axillary Oral Oral  SpO2: 100% 96% 100% 98%  Weight:        PHYSICAL EXAM  General -pleasant elderly   African-American male, not in distress Afebrile. Head is nontraumatic. Neck is supple without bruit.    Cardiac exam no murmur or gallop. Lungs are clear to auscultation. Distal pulses are well felt. Neurological Exam: he is awake alert oriented to time place and person.  He has moderate dysarthria, improving. He has no gaze preference, and following all simple commands. Blinking to visual threat bilaterally, PERRL. Left lower facial droop and tongue midline. RUE and RLE 5/5, LUE 3-/5 proximal, bicep 4+/5, tricep and hand grip 3/5. LUE with increased tone. LLE 5/5 proximal and 4/5 distal. DTR 1+ and no babinski. Sensation symmetrical, coordination intact on the right but ataxic on the left, and gait not tested.    ASSESSMENT/PLAN Mr. Ben Habermann is a 73 y.o. male with history of stroke, schizophrenia, measles, hypertension, hyperlipidemia, depression, dementia presenting with L sided weakness, R gaze deviation and dysarthria.  Received IV tPA 08/31/2018 at 1320. Taken to IR with angioplasty and TICI3 of right MCA.  Stroke: R MCA infarct  s/p tPA and angioplasty R terminal ICA, infarct  secondary to large vessel disease    Code Stroke CT head No acute stroke. Old R parietal lobe encephalomalacia.  ASPECTS 10.     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 New stenosis R ACA A2.   CT perfusion large penumbra,  essentially unchanged since Feb  Cerebral angio R terminal ICA angioplasty w. TICI 3 revascularization  Post IR CT no ICH, mass effect or shift  MRI -  Widespread gyriform restricted diffusion in the right hemisphere compatible with cortical ischemia.   MRA - diminutive but patent right ICA, although focal loss of flow signal in the supraclinoid segment raising the possibility of residual stenosis. Poor flow in the right PCA, which demonstrated severe stenosis by CTA yesterday. There is some faint flow signal in the right vertebral artery V4 segment despite chronic right vertebral occlusion.  2D Echo  - EF 55-60%. No cardiac source of emboli identified.   LDL 53  HgbA1c 5.1  SCDs for VTE prophylaxis  clopidogrel 75 mg daily prior to admission, now on  aspirin 325 mg daily and clopidogrel 75 mg daily. Continue DAPT at d/c  X 3 months the PLAVIX alone and elective right ICA stenting in 4-6 weeks  Therapy recommendations:  SNF   Disposition:  Pending SNF - Case manager and SW following.   Medically ready for d/c when bed available  Right ICA occlusion  In 06/2016 CTA showed right ICA occlusion at ophthalmic level with distal reconstitution  04/2018 admission CTA showed right ICA occlusion at bifurcation, progressed from 06/2016  This admission still with R ICA occlusion at ophthalmic level though some recannalization  S/p IR with terminal ICA angioplasty with TICI3 reperfusion  Avoid low BP  Plan  R ICA stenting in 4-6 weeks  History of stroke/TIA  04/2018 TIA due to right ICA occlusion in the setting of hypotension. MRI neg. Found to have R ICA occlusion at origin, R ICA siphon occluded 2 years prior. On plavix. Home with family though difficulty to care for.  06/2016 admitted for a fall and difficulty walking. Also had a cough and fever due to left lower lobe PNA. MRI showed right MCA/ACA, MCA/PCA infarcts. CTA head neck showed right ICA occlusion at the ophthalmic level with  distal reconstitution, right P1 stenosis and right VA occlusion. IR also confirmed right ICA occlusion, no intervention offered. LDL 86 and A1c 5.4, patient discharged with DAPT for 3 months and Lipitor 80.  Hypertension  Home meds:  Lisinopril 20 (Catapres and Metoprolol PTA ?) . Now on Lisinopril 10 mg daily  Stable  Hyperlipidemia  Home meds:  lipitor 80, resumed in hospital  LDL 53, goal < 70  Continue statin at discharge  Dysphagia  On D3 thin Diet   Still has severe dysarthria  Other Stroke Risk Factors  Advanced age  Obesity, Body mass index is 33.67 kg/m., recommend weight loss, diet and exercise as appropriate   Other Active Problems  Dementia at baseline  Bipolar disorder  Prostate cancer  Hospital day # 12  [name], MD PhD Stroke Neurology 09/12/2018 2:26 PM    To contact Stroke Continuity provider, please refer to [email or web address] After hours, contact General Neurology

## 2018-09-12 NOTE — Progress Notes (Signed)
  Speech Language Pathology Treatment: Dysphagia  Patient Details Name: Ronald [name] MRN: [id number] DOB: 1946-01-19 Today's Date: 09/12/2018 Time: 9758-8325 SLP Time Calculation ([name]) (ACUTE ONLY): 13 [name]  Assessment / Plan / Recommendation Clinical Impression  Pt sitting EOB eating breakfast.  Continues with hyper-verbosity, confabulation with fixation on A-list movie stars and his relationship to them.  He is more easily redirected today; noted to be more attentive to spillage from left side of mouth and pocketing.  He is aware of precaution sign and referred me to its presence multiple times.  Pt is tolerating current diet well and following precautions more reliably.  Continue dysphagia 3, thin liquids.     HPI HPI: Pt is a 73 y.o. male with history of stroke, schizophrenia, measles, hypertension, hyperlipidemia, depression, dementia. Patient was found at his home sitting in a chair when he suddenly slumped over to the left and was noted to be flaccid on the left with right gaze deviation along with dysarthric. CT of the head revealed stable since February and chronic right parietal lobe encephalomalacia. MRI of the brain has not been completed as yet.       SLP Plan  Continue with current plan of care       Recommendations  Diet recommendations: Dysphagia 3 (mechanical soft);Thin liquid Liquids provided via: Cup;Straw Medication Administration: Whole meds with puree Supervision: Patient able to self feed Compensations: Slow rate;Small sips/bites;Lingual sweep for clearance of pocketing Postural Changes and/or Swallow Maneuvers: Seated upright 90 degrees                Oral Care Recommendations: Oral care BID Follow up Recommendations: Skilled Nursing facility SLP Visit Diagnosis: Dysphagia, oral phase (R13.11) Plan: Continue with current plan of care       GO                Ronald [name] 09/12/2018, 8:49 AM  Estill Bamberg L. Tivis Ringer, [place] Office number [phone] Pager [phone]

## 2018-09-12 NOTE — Plan of Care (Signed)
  Problem: Education: Goal: Knowledge of General Education information will improve Description Including pain rating scale, medication(s)/side effects and non-pharmacologic comfort measures Outcome: Progressing   Problem: Health Behavior/Discharge Planning: Goal: Ability to manage health-related needs will improve Outcome: Progressing   Problem: Clinical Measurements: Goal: Ability to maintain clinical measurements within normal limits will improve Outcome: Progressing Goal: Will remain free from infection Outcome: Progressing Goal: Diagnostic test results will improve Outcome: Progressing Goal: Respiratory complications will improve Outcome: Progressing Goal: Cardiovascular complication will be avoided Outcome: Progressing   Problem: Activity: Goal: Risk for activity intolerance will decrease Outcome: Progressing   Problem: Nutrition: Goal: Adequate nutrition will be maintained Outcome: Progressing   Problem: Coping: Goal: Level of anxiety will decrease Outcome: Progressing   Problem: Elimination: Goal: Will not experience complications related to bowel motility Outcome: Progressing Goal: Will not experience complications related to urinary retention Outcome: Progressing   Problem: Pain Managment: Goal: General experience of comfort will improve Outcome: Progressing   Problem: Safety: Goal: Ability to remain free from injury will improve Outcome: Progressing   Problem: Skin Integrity: Goal: Risk for impaired skin integrity will decrease Outcome: Progressing   Problem: Education: Goal: Knowledge of disease or condition will improve Outcome: Progressing Goal: Knowledge of secondary prevention will improve Outcome: Progressing Goal: Knowledge of patient specific risk factors addressed and post discharge goals established will improve Outcome: Progressing Goal: Individualized Educational Video(s) Outcome: Progressing   Problem: Coping: Goal: Will verbalize  positive feelings about self Outcome: Progressing Goal: Will identify appropriate support needs Outcome: Progressing   Problem: Health Behavior/Discharge Planning: Goal: Ability to manage health-related needs will improve Outcome: Progressing   Problem: Self-Care: Goal: Ability to participate in self-care as condition permits will improve Outcome: Progressing Goal: Verbalization of feelings and concerns over difficulty with self-care will improve Outcome: Progressing Goal: Ability to communicate needs accurately will improve Outcome: Progressing   Problem: Nutrition: Goal: Risk of aspiration will decrease Outcome: Progressing Goal: Dietary intake will improve Outcome: Progressing   Problem: Ischemic Stroke/TIA Tissue Perfusion: Goal: Complications of ischemic stroke/TIA will be minimized Outcome: Progressing  Icholas Irby, BSN, RN 

## 2018-09-13 LAB — GLUCOSE, CAPILLARY
Glucose-Capillary: 109 mg/dL — ABNORMAL HIGH (ref 70–99)
Glucose-Capillary: 140 mg/dL — ABNORMAL HIGH (ref 70–99)
Glucose-Capillary: 125 mg/dL — ABNORMAL HIGH (ref 70–99)
Glucose-Capillary: 108 mg/dL — ABNORMAL HIGH (ref 70–99)

## 2018-09-13 LAB — SARS CORONAVIRUS 2: SARS Coronavirus 2: NOT DETECTED

## 2018-09-13 MED ORDER — LABETALOL HCL 5 MG/ML IV SOLN: 5.0000 mg | INTRAVENOUS | Status: DC | PRN

## 2018-09-13 NOTE — Progress Notes (Signed)
Physical Therapy Treatment Patient Details Name: Ronald [name] MRN: [id number] DOB: [birth year] Today's Date: 09/13/2018    History of Present Illness 73 yo male admitted after sudden onset lt sided weakness. Pt with rt MCA infarct and underwent TPA and angioplasty to rt ICA on 6/4. Intubated 6/4-6/6.  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 He required assistance to keep his left hand on the walker. He was able to stand for close to 15 minutes while talking and telling storys. He would take a step at times but would not take too many steps in a row.   Follow Up Recommendations  SNF;Supervision/Assistance - 24 hour     Equipment Recommendations  None recommended by PT    Recommendations for Other Services       Precautions / Restrictions Precautions Precautions: Fall Restrictions Weight Bearing Restrictions: No    Mobility  Bed Mobility Overal bed mobility: Needs Assistance Bed Mobility: Supine to Sit     Supine to sit: Mod assist     General bed mobility comments: pt yelling "Don't touch my arm."  Transfers Overall transfer level: Needs assistance Equipment used: 1 person hand held assist Transfers: Sit to/from Stand Sit to Stand: Min guard Stand pivot transfers: Mod assist       General transfer comment: Patient stood 4x with therapy but was unable (Simultaneous filing. User may not have seen previous data.)  Ambulation/Gait Ambulation/Gait assistance: Mod assist Gait Distance (Feet): 6 Feet     Gait velocity: decreased    General Gait Details: took a few steps forward and back several times but could not stay focused enough to walk to the door depsite max cuing and encouragement.    Stairs             Wheelchair Mobility    Modified Rankin (Stroke Patients Only)        Balance Overall balance assessment: Needs assistance Sitting-balance support: Feet supported;No upper extremity supported Sitting balance-Leahy Scale: Fair       Standing balance-Leahy Scale: Poor Standing balance comment: reliant on at least single UE support/minA at this time                            Cognition Arousal/Alertness: Awake/alert Behavior During Therapy: WFL for tasks assessed/performed Overall Cognitive Status: History of cognitive impairments - at baseline Area of Impairment: Attention;Memory;Following commands;Safety/judgement;Problem solving;Awareness                   Current Attention Level: Sustained Memory: Decreased short-term memory Following Commands: Follows one step commands with increased time;Follows one step commands inconsistently Safety/Judgement: Decreased awareness of safety;Decreased awareness of deficits Awareness: Intellectual Problem Solving: Slow processing;Requires verbal cues;Requires tactile cues;Decreased initiation;Difficulty sequencing General Comments: Pt requiring cues to attend to tasks;no attention span      Exercises      General Comments        Pertinent Vitals/Pain Pain Assessment: No/denies pain    Home Living                      Prior Function            PT Goals (current goals can now be found in the care plan section) Acute Rehab PT Goals Patient Stated Goal: go home PT Goal Formulation:  With patient Time For Goal Achievement: 09/16/18 Potential to Achieve Goals: Good Progress towards PT goals: Progressing toward goals    Frequency    Min 3X/week      PT Plan Current plan remains appropriate    Co-evaluation              AM-PAC PT "6 Clicks" Mobility   Outcome Measure  Help needed turning from your back to your side while in a flat bed without using bedrails?: A Little Help needed moving from lying on your back to sitting on the side of a flat bed  without using bedrails?: A Little Help needed moving to and from a bed to a chair (including a wheelchair)?: A Little Help needed standing up from a chair using your arms (e.g., wheelchair or bedside chair)?: A Lot Help needed to walk in hospital room?: A Lot Help needed climbing 3-5 steps with a railing? : A Lot 6 Click Score: 15    End of Session Equipment Utilized During Treatment: Gait belt Activity Tolerance: Patient tolerated treatment well Patient left: in chair;with call bell/phone within reach;with chair alarm set Nurse Communication: Mobility status PT Visit Diagnosis: Unsteadiness on feet (R26.81);Other abnormalities of gait and mobility (R26.89);Hemiplegia and hemiparesis Hemiplegia - Right/Left: Left Hemiplegia - dominant/non-dominant: Non-dominant Hemiplegia - caused by: Cerebral infarction     Time: 0240-0300 PT Time Calculation (min) (ACUTE ONLY): 20 min  Charges:  $Therapeutic Activity: 8-22 mins                        Carney Living PT DPT  09/13/2018, 4:00 PM

## 2018-09-13 NOTE — Progress Notes (Signed)
STROKE TEAM PROGRESS NOTE   INTERVAL HISTORY Pt sitting in chair, neuro stable. No complains. Pending SNF. Will need to check COVID again.  . Vitals:   09/12/18 2158 09/12/18 2344 09/13/18 0342 09/13/18 0904  BP: (!) 131/51 (!) 138/57 127/60 (!) 157/57  Pulse: 66 73 75 74  Resp:  18 20 16   Temp:  98 F (36.7 C) 97.9 F (36.6 C) 99.1 F (37.3 C)  TempSrc:  Oral Oral Oral  SpO2:  98% 99% 99%  Weight:        PHYSICAL EXAM  General -pleasant elderly   African-American male, not in distress Afebrile. Head is nontraumatic. Neck is supple without bruit.    Cardiac exam no murmur or gallop. Lungs are clear to auscultation. Distal pulses are well felt. Neurological Exam: he is awake alert oriented to time place and person.  He has moderate dysarthria, improving. He has no gaze preference, and following all simple commands. Blinking to visual threat bilaterally, PERRL. Left lower facial droop and tongue midline. RUE and RLE 5/5, LUE 3-/5 proximal, bicep 4+/5, tricep and hand grip 3/5. LUE with increased tone. LLE 5/5 proximal and 4/5 distal. DTR 1+ and no babinski. Sensation symmetrical, coordination intact on the right but ataxic on the left, and gait not tested.    ASSESSMENT/PLAN [name] is a 73 y.o. male with history of stroke, schizophrenia, measles, hypertension, hyperlipidemia, depression, dementia presenting with L sided weakness, R gaze deviation and dysarthria.  Received IV tPA 08/31/2018 at 1320. Taken to IR with angioplasty and TICI3 of right MCA.  Stroke: R MCA infarct  s/p tPA and angioplasty R terminal ICA, infarct  secondary to large vessel disease    Code Stroke CT head No acute stroke. Old R parietal lobe encephalomalacia.  ASPECTS 10.     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 New stenosis R ACA A2.   CT perfusion large penumbra, essentially unchanged since Feb  Cerebral angio R  terminal ICA angioplasty w. TICI 3 revascularization  Post IR CT no ICH, mass effect or shift  MRI -  Widespread gyriform restricted diffusion in the right hemisphere compatible with cortical ischemia.   MRA - diminutive but patent right ICA, although focal loss of flow signal in the supraclinoid segment raising the possibility of residual stenosis. Poor flow in the right PCA, which demonstrated severe stenosis by CTA yesterday. There is some faint flow signal in the right vertebral artery V4 segment despite chronic right vertebral occlusion.  2D Echo  - EF 55-60%. No cardiac source of emboli identified.   LDL 53  HgbA1c 5.1  SCDs for VTE prophylaxis  clopidogrel 75 mg daily prior to admission, now on  aspirin 325 mg daily and clopidogrel 75 mg daily. Continue DAPT at d/c  X 3 months the PLAVIX alone and elective right ICA stenting in 4-6 weeks  Therapy recommendations:  SNF   Disposition:  Pending SNF - Case manager and SW following.   Medically ready for d/c when bed available  Right ICA occlusion  In 06/2016 CTA showed right ICA occlusion at ophthalmic level with distal reconstitution  04/2018 admission CTA showed right ICA occlusion at bifurcation, progressed from 06/2016  This admission still with R ICA occlusion at ophthalmic level though some recannalization  S/p IR with terminal ICA angioplasty with TICI3 reperfusion  Avoid low BP  Plan R ICA stenting in 4-6 weeks  History of stroke/TIA  04/2018 TIA due to right ICA occlusion in the setting of hypotension. MRI neg. Found to have R ICA occlusion at origin, R ICA siphon occluded 2 years prior. On plavix. Home with family though difficulty to care for.  06/2016 admitted for a fall and difficulty walking. Also had a cough and fever due to left lower lobe PNA. MRI showed right MCA/ACA, MCA/PCA infarcts. CTA head neck showed right ICA occlusion at the ophthalmic level with distal reconstitution, right P1 stenosis and right  VA occlusion. IR also confirmed right ICA occlusion, no intervention offered. LDL 86 and A1c 5.4, patient discharged with DAPT for 3 months and Lipitor 80.  Hypertension  Home meds:  Lisinopril 20 (Catapres and Metoprolol PTA ?) . Now on Lisinopril 10 mg daily, metoprolol 50 bid  Stable on the low side  D/c clonidine 0.1 bid  BP goal 130-150 given high grade stenosis of terminal right ICA s/p angioplasty  Hyperlipidemia  Home meds:  lipitor 80, resumed in hospital  LDL 53, goal < 70  Continue statin at discharge  Dysphagia  On D3 thin Diet   Still has severe dysarthria   Other Stroke Risk Factors  Advanced age  Obesity, Body mass index is 33.67 kg/m., recommend weight loss, diet and exercise as appropriate   Other Active Problems  Dementia at baseline  Bipolar disorder  Prostate cancer  Hospital day # 13  [name], MD PhD Stroke Neurology 09/13/2018 11:46 AM    To contact Stroke Continuity provider, please refer to [email or web address] After hours, contact General Neurology

## 2018-09-13 NOTE — Plan of Care (Signed)
  Problem: Education: Goal: Knowledge of General Education information will improve Description Including pain rating scale, medication(s)/side effects and non-pharmacologic comfort measures Outcome: Progressing   Problem: Health Behavior/Discharge Planning: Goal: Ability to manage health-related needs will improve Outcome: Progressing   Problem: Clinical Measurements: Goal: Ability to maintain clinical measurements within normal limits will improve Outcome: Progressing Goal: Will remain free from infection Outcome: Progressing Goal: Diagnostic test results will improve Outcome: Progressing Goal: Respiratory complications will improve Outcome: Progressing Goal: Cardiovascular complication will be avoided Outcome: Progressing   Problem: Activity: Goal: Risk for activity intolerance will decrease Outcome: Progressing   Problem: Nutrition: Goal: Adequate nutrition will be maintained Outcome: Progressing   Problem: Coping: Goal: Level of anxiety will decrease Outcome: Progressing   Problem: Elimination: Goal: Will not experience complications related to bowel motility Outcome: Progressing Goal: Will not experience complications related to urinary retention Outcome: Progressing   Problem: Pain Managment: Goal: General experience of comfort will improve Outcome: Progressing   Problem: Safety: Goal: Ability to remain free from injury will improve Outcome: Progressing   Problem: Skin Integrity: Goal: Risk for impaired skin integrity will decrease Outcome: Progressing   Problem: Education: Goal: Knowledge of disease or condition will improve Outcome: Progressing Goal: Knowledge of secondary prevention will improve Outcome: Progressing Goal: Knowledge of patient specific risk factors addressed and post discharge goals established will improve Outcome: Progressing Goal: Individualized Educational Video(s) Outcome: Progressing   Problem: Coping: Goal: Will verbalize  positive feelings about self Outcome: Progressing Goal: Will identify appropriate support needs Outcome: Progressing   Problem: Health Behavior/Discharge Planning: Goal: Ability to manage health-related needs will improve Outcome: Progressing   Problem: Self-Care: Goal: Ability to participate in self-care as condition permits will improve Outcome: Progressing Goal: Verbalization of feelings and concerns over difficulty with self-care will improve Outcome: Progressing Goal: Ability to communicate needs accurately will improve Outcome: Progressing   Problem: Nutrition: Goal: Risk of aspiration will decrease Outcome: Progressing Goal: Dietary intake will improve Outcome: Progressing   Problem: Ischemic Stroke/TIA Tissue Perfusion: Goal: Complications of ischemic stroke/TIA will be minimized Outcome: Progressing  Taylinn Brabant, BSN, RN 

## 2018-09-13 NOTE — TOC Progression Note (Addendum)
Transition of Care [facility]) - Progression Note    Patient Details  Name: Ronald [name] MRN: [id number] Date of Birth: April 13, 1945  Transition of Care [facility]) CM/SW Contact  Wandra Feinstein [place], [place] Phone Number: 09/13/2018, 10:55 AM  Clinical Narrative:   Josem Kaufmann received from Berta Minor at [facility] aware and has auth info. 30 day note sent with requested info to [facility] today. Unable to d/c until [facility] # received. Also messaged MD with request for new COVID-19 test as last result was 6/4.    Expected Discharge Plan: SNF Barriers to Discharge: Continued Medical Work up  Expected Discharge Plan and Services Expected Discharge Plan: Assisted Living       Living arrangements for the past 2 months: Single Family Home                   DME Agency: NA       HH Arranged: NA HH Agency: NA         Social Determinants of Health (SDOH) Interventions    Readmission Risk Interventions No flowsheet data found.

## 2018-09-13 NOTE — TOC Progression Note (Signed)
Transition of Care [facility]) - Progression Note    Patient Details  Name: Johnothan Bascomb MRN: [id number] Date of Birth: Jul 08, 1945  Transition of Care [facility]) CM/SW Contact  Amador Cunas,  Phone Number: 09/13/2018, 2:04 PM  Clinical Narrative:   [facility] received [id number] E. MESH has Josem Kaufmann but requested new COVID test; MD messaged with request this AM.     Expected Discharge Plan: [place] Barriers to Discharge: Continued Medical Work up  Expected Discharge Plan and Services Expected Discharge Plan: [place] arrangements for the past 2 months: Single Family Home                   DME Agency: NA       HH Arranged: NA HH Agency: NA         Social Determinants of Health (SDOH) Interventions    Readmission Risk Interventions No flowsheet data found.

## 2018-09-13 NOTE — Plan of Care (Signed)
  Problem: Education: Goal: Knowledge of General Education information will improve Description Including pain rating scale, medication(s)/side effects and non-pharmacologic comfort measures Outcome: Progressing   Problem: Health Behavior/Discharge Planning: Goal: Ability to manage health-related needs will improve Outcome: Progressing   Problem: Clinical Measurements: Goal: Ability to maintain clinical measurements within normal limits will improve Outcome: Progressing Goal: Will remain free from infection Outcome: Progressing Goal: Diagnostic test results will improve Outcome: Progressing Goal: Respiratory complications will improve Outcome: Progressing Goal: Cardiovascular complication will be avoided Outcome: Progressing   Problem: Activity: Goal: Risk for activity intolerance will decrease Outcome: Progressing   Problem: Nutrition: Goal: Adequate nutrition will be maintained Outcome: Progressing   Problem: Coping: Goal: Level of anxiety will decrease Outcome: Progressing   Problem: Elimination: Goal: Will not experience complications related to bowel motility Outcome: Progressing Goal: Will not experience complications related to urinary retention Outcome: Progressing   Problem: Pain Managment: Goal: General experience of comfort will improve Outcome: Progressing   Problem: Safety: Goal: Ability to remain free from injury will improve Outcome: Progressing   Problem: Skin Integrity: Goal: Risk for impaired skin integrity will decrease Outcome: Progressing   Problem: Education: Goal: Knowledge of disease or condition will improve Outcome: Progressing Goal: Knowledge of secondary prevention will improve Outcome: Progressing Goal: Knowledge of patient specific risk factors addressed and post discharge goals established will improve Outcome: Progressing Goal: Individualized Educational Video(s) Outcome: Progressing   Problem: Coping: Goal: Will verbalize  positive feelings about self Outcome: Progressing Goal: Will identify appropriate support needs Outcome: Progressing   Problem: Health Behavior/Discharge Planning: Goal: Ability to manage health-related needs will improve Outcome: Progressing   Problem: Self-Care: Goal: Ability to participate in self-care as condition permits will improve Outcome: Progressing Goal: Verbalization of feelings and concerns over difficulty with self-care will improve Outcome: Progressing Goal: Ability to communicate needs accurately will improve Outcome: Progressing   Problem: Nutrition: Goal: Risk of aspiration will decrease Outcome: Progressing Goal: Dietary intake will improve Outcome: Progressing   Problem: Ischemic Stroke/TIA Tissue Perfusion: Goal: Complications of ischemic stroke/TIA will be minimized Outcome: Progressing  Lurie Mullane, BSN, RN 

## 2018-09-13 NOTE — Progress Notes (Signed)
  Speech Language Pathology Treatment: Cognitive-Linquistic  Patient Details Name: Ronald [name] MRN: [id number] DOB: [birth year] Today's Date: 09/13/2018 Time: 9518-8416 SLP Time Calculation (min) (ACUTE ONLY): 18 min  Assessment / Plan / Recommendation Clinical Impression  Pt was seen for treatment and was moderately cooperative during the session. Pt was upset since his bed initially could not be seated upright but his being upset persisted even after the bed was fixed. 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 He was minimally to moderately responsive to redirection. He was educated regarding compensatory strategies for speech intelligibility. He verbalized understanding regarding areas of education but the extent of his comprehension is questioned. He required min-mod cues for use of compensatory strategies at the word level and mod-max cues at the phrase and sentence levels. SLP will continue to follow.    HPI HPI: Pt is a 73 y.o. male with history of stroke, schizophrenia, measles, hypertension, hyperlipidemia, depression, dementia. Patient was found at his home sitting in a chair when he suddenly slumped over to the left and was noted to be flaccid on the left with right gaze deviation along with dysarthric. CT of the head revealed stable since February and chronic right parietal lobe encephalomalacia. MRI of the brain has not been completed as yet.       SLP Plan  Continue with current plan of care       Recommendations                   Oral Care Recommendations: Oral care BID Follow up Recommendations: Skilled Nursing facility SLP  Visit Diagnosis: Dysphagia, oral phase (R13.11) Plan: Continue with current plan of care       Ronald [name] I. Hardin Negus, [place], [place] Office number [phone] Pager [place] 09/13/2018, 5:39 PM

## 2018-09-13 NOTE — Progress Notes (Signed)
Occupational Therapy Treatment Patient Details Name: Ronald [name] MRN: [id number] DOB: 1945/10/05 Today's Date: 09/13/2018    History of present illness 73 yo male admitted after sudden onset lt sided weakness. Pt with rt MCA infarct and underwent TPA and angioplasty to rt ICA on 6/4. Intubated 6/4-6/6.  PMH: dementia, depression, hypertension, hyperlipidemia, schizophrenia, stroke involving R carotid watershed areas with residual left sided weakness   OT comments  Pt argumentative and talking tangentially. Pt modA for bed mobility for trunk elvation; LUE HEP for elbow, wrist and hand performed x5 reps for ROM - shoulder AAROM sore with shoulder flex and shoulder abduction. Pt performing sit to stand x3 times with minA overall and shuffled to recliner. Pt denying need for ADL task and moved wash cloth away from face. Pt would benefit from continued OT skilled services for ADL, mobility and safety. OT following.    Follow Up Recommendations  SNF;Supervision/Assistance - 24 hour    Equipment Recommendations  Other (comment)    Recommendations for Other Services      Precautions / Restrictions Precautions Precautions: Fall Restrictions Weight Bearing Restrictions: No       Mobility Bed Mobility Overal bed mobility: Needs Assistance Bed Mobility: Supine to Sit     Supine to sit: Mod assist     General bed mobility comments: pt yelling "Don't touch my arm."  Transfers Overall transfer level: Needs assistance Equipment used: 1 person hand held assist Transfers: Sit to/from Stand Sit to Stand: Min guard Stand pivot transfers: Mod assist       General transfer comment: Pt not interested in performing more than a transfer     Balance Overall balance assessment: Needs assistance Sitting-balance support: Feet supported;No upper extremity supported Sitting balance-Leahy Scale: Fair       Standing balance-Leahy Scale: Poor Standing balance comment: reliant on at least  single UE support/minA at this time                           ADL either performed or assessed with clinical judgement   ADL Overall ADL's : Needs assistance/impaired                                     Functional mobility during ADLs: Minimal assistance;Rolling walker General ADL Comments: pt continues to have impaired cognition, descreased attention to L side, L side weakness     Vision   Vision Assessment?: Yes;Vision impaired- to be further tested in functional context Eye Alignment: Within Functional Limits Ocular Range of Motion: Impaired-to be further tested in functional context   Perception     Praxis      Cognition Arousal/Alertness: Awake/alert Behavior During Therapy: WFL for tasks assessed/performed Overall Cognitive Status: History of cognitive impairments - at baseline Area of Impairment: Attention;Memory;Following commands;Safety/judgement;Problem solving;Awareness                   Current Attention Level: Sustained Memory: Decreased short-term memory Following Commands: Follows one step commands with increased time;Follows one step commands inconsistently Safety/Judgement: Decreased awareness of safety;Decreased awareness of deficits Awareness: Intellectual Problem Solving: Slow processing;Requires verbal cues;Requires tactile cues;Decreased initiation;Difficulty sequencing General Comments: Pt requiring cues to attend to tasks;no attention span        Exercises     Shoulder Instructions       General Comments      Pertinent Vitals/ Pain  Pain Assessment: No/denies pain  Home Living                                          Prior Functioning/Environment              Frequency  Min 2X/week        Progress Toward Goals  OT Goals(current goals can now be found in the care plan section)  Progress towards OT goals: Progressing toward goals  Acute Rehab OT Goals Patient Stated  Goal: go home OT Goal Formulation: With patient Time For Goal Achievement: 09/16/18 Potential to Achieve Goals: Good ADL Goals Pt Will Perform Grooming: with set-up;with supervision;sitting Pt Will Transfer to Toilet: with min guard assist;ambulating;bedside commode Pt Will Perform Toileting - Clothing Manipulation and hygiene: with min guard assist;sit to/from stand;sitting/lateral leans Pt/caregiver will Perform Home Exercise Program: Increased ROM;Increased strength;Left upper extremity;With minimal assist Additional ADL Goal #1: Pt will sustain attention to simple ADLs with Min cues Additional ADL Goal #2: Pt will perform bed mobility with supervision in preparation for ADLs  Plan Discharge plan needs to be updated    Co-evaluation                 AM-PAC OT "6 Clicks" Daily Activity     Outcome Measure   Help from another person eating meals?: A Little Help from another person taking care of personal grooming?: A Little Help from another person toileting, which includes using toliet, bedpan, or urinal?: A Lot Help from another person bathing (including washing, rinsing, drying)?: A Lot Help from another person to put on and taking off regular upper body clothing?: A Lot Help from another person to put on and taking off regular lower body clothing?: A Lot 6 Click Score: 14    End of Session Equipment Utilized During Treatment: Gait belt;Rolling walker  OT Visit Diagnosis: Unsteadiness on feet (R26.81);Other abnormalities of gait and mobility (R26.89);Muscle weakness (generalized) (M62.81);Other symptoms and signs involving cognitive function;Hemiplegia and hemiparesis Hemiplegia - Right/Left: Left Hemiplegia - dominant/non-dominant: Non-Dominant Hemiplegia - caused by: Cerebral infarction   Activity Tolerance Patient tolerated treatment well   Patient Left in chair;with call bell/phone within reach;with chair alarm set   Nurse Communication Mobility status         Time: 1129-1150 OT Time Calculation (min): 21 min  Charges: OT General Charges $OT Visit: 1 Visit OT Treatments $Neuromuscular Re-education: 8-22 mins  Darryl Nestle) Marsa Aris OTR/L Acute Rehabilitation Services Pager: [phone] Office: [phone]   Audie Pinto 09/13/2018, 3:58 PM

## 2018-09-14 ENCOUNTER — Other Ambulatory Visit: Payer: Self-pay

## 2018-09-14 DIAGNOSIS — R1310 Dysphagia, unspecified: Secondary | ICD-10-CM

## 2018-09-14 DIAGNOSIS — R471 Dysarthria and anarthria: Secondary | ICD-10-CM

## 2018-09-14 LAB — GLUCOSE, CAPILLARY
Glucose-Capillary: 108 mg/dL — ABNORMAL HIGH (ref 70–99)
Glucose-Capillary: 132 mg/dL — ABNORMAL HIGH (ref 70–99)

## 2018-09-14 MED ORDER — ASPIRIN 325 MG PO TBEC: 325.0000 mg | DELAYED_RELEASE_TABLET | Freq: Every day | ORAL | 0 refills | Status: DC

## 2018-09-14 MED ORDER — LISINOPRIL 10 MG PO TABS: 10.0000 mg | ORAL_TABLET | Freq: Every day | ORAL | Status: DC

## 2018-09-14 MED ORDER — ENSURE ENLIVE PO LIQD: 237.0000 mL | Freq: Two times a day (BID) | ORAL | 12 refills | Status: AC

## 2018-09-14 MED ORDER — METOPROLOL TARTRATE 50 MG PO TABS: 50.0000 mg | ORAL_TABLET | Freq: Two times a day (BID) | ORAL | Status: AC

## 2018-09-14 NOTE — Progress Notes (Addendum)
Physical Therapy Treatment Patient Details Name: Ronald [name] MRN: [id number] DOB: [birth year] Today's Date: 09/14/2018    History of Present Illness Pt is a 73 y.o. male admitted 08/31/18 with L-side weakness. 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 ETT 6/4-6/6. PMH includes dementia, schizophrenia, stroke (residual L-side weakness), depression, HTN, HLD.   PT Comments    Pt slowly progressing with mobility. Pt intermittently aware of LUE weakness, insistent on ambulating with RW and able to assist L hand onto handle, but quickly forgetting to attend to L hand requiring modA to correct and maintain standing/dynamic balance. Pt tangential with speech, minimally to moderately responsive to redirection. Continue to recommend SNF-level therapies.    Follow Up Recommendations  SNF;Supervision/Assistance - 24 hour     Equipment Recommendations  None recommended by PT    Recommendations for Other Services       Precautions / Restrictions Precautions Precautions: Fall Restrictions Weight Bearing Restrictions: No    Mobility  Bed Mobility               General bed mobility comments: Received sitting in recliner  Transfers Overall transfer level: Needs assistance Equipment used: 1 person hand held assist Transfers: Sit to/from Stand Sit to Stand: Min guard         General transfer comment: Pt attentive to use RUE to help L hand grip RW, but quickly forgetful of L hand falling off RW. Insistent on using RW  Ambulation/Gait Ambulation/Gait assistance: Min assist;Mod assist Gait Distance (Feet): 120 Feet Assistive device: Rolling walker (2 wheeled) Gait Pattern/deviations: Step-through pattern;Decreased stride length Gait velocity: Decreased   General Gait Details: Slow gait with RW and intermittent minA for balance, consistent min-modA to keep L hand on RW. Pt internally distracted by his stories requiring frequent redirection to  task; frequent cues to maintain proximity to [employer] Mobility    Modified Rankin (Stroke Patients Only) Modified Rankin (Stroke Patients Only) Pre-Morbid Rankin Score: Slight disability Modified Rankin: Moderately severe disability     Balance Overall balance assessment: Needs assistance Sitting-balance support: Feet supported;No upper extremity supported Sitting balance-Leahy Scale: Fair     Standing balance support: During functional activity;Single extremity supported Standing balance-Leahy Scale: Poor Standing balance comment: Reliant on UE support                            Cognition Arousal/Alertness: Awake/alert Behavior During Therapy: WFL for tasks assessed/performed Overall Cognitive Status: History of cognitive impairments - at baseline Area of Impairment: Attention;Memory;Following commands;Safety/judgement;Problem solving;Awareness                   Current Attention Level: Sustained Memory: Decreased short-term memory Following Commands: Follows one step commands with increased time;Follows one step commands inconsistently Safety/Judgement: Decreased awareness of safety;Decreased awareness of deficits Awareness: Intellectual Problem Solving: Slow processing;Requires verbal cues;Requires tactile cues;Decreased initiation;Difficulty sequencing General Comments: Pt requiring cues to attend to tasks;no attention span.Tangential speech and switches subject matter quickly; minimally to moderately responsive to redirection      Exercises      General Comments        Pertinent Vitals/Pain Pain Assessment: No/denies pain    Home Living                      Prior Function  PT Goals (current goals can now be found in the care plan section) Acute Rehab PT Goals Patient Stated Goal: go home PT Goal Formulation: With patient/family Time For Goal Achievement: 09/16/18 Potential to  Achieve Goals: Good Progress towards PT goals: Progressing toward goals    Frequency    Min 3X/week      PT Plan Current plan remains appropriate    Co-evaluation              AM-PAC PT "6 Clicks" Mobility   Outcome Measure  Help needed turning from your back to your side while in a flat bed without using bedrails?: A Little Help needed moving from lying on your back to sitting on the side of a flat bed without using bedrails?: A Little Help needed moving to and from a bed to a chair (including a wheelchair)?: A Little Help needed standing up from a chair using your arms (e.g., wheelchair or bedside chair)?: A Little Help needed to walk in hospital room?: A Little Help needed climbing 3-5 steps with a railing? : A Lot 6 Click Score: 17    End of Session Equipment Utilized During Treatment: Gait belt Activity Tolerance: Patient tolerated treatment well Patient left: in chair;with call bell/phone within reach;with chair alarm set Nurse Communication: Mobility status PT Visit Diagnosis: Unsteadiness on feet (R26.81);Other abnormalities of gait and mobility (R26.89);Hemiplegia and hemiparesis Hemiplegia - Right/Left: Left Hemiplegia - dominant/non-dominant: Non-dominant Hemiplegia - caused by: Cerebral infarction     Time: 1416-1440 PT Time Calculation (min) (ACUTE ONLY): 24 min  Charges:  $Gait Training: 8-22 mins $Therapeutic Activity: 8-22 mins                    Mabeline Caras, PT, DPT Acute Rehabilitation Services  Pager [phone] Office [place] 09/14/2018, 5:29 PM

## 2018-09-14 NOTE — Progress Notes (Signed)
Patient to be discharged to [facility]. Report given to [facility]. No IV to remove. Discharge instructions and prescription information given to the patient who verbalized understanding. Patient to be transported by [facility].

## 2018-09-14 NOTE — Plan of Care (Signed)
completed

## 2018-09-14 NOTE — Plan of Care (Signed)
Progressing towards goals

## 2018-09-14 NOTE — TOC Transition Note (Signed)
Transition of Care [facility]) - CM/SW Discharge Note   Patient Details  Name: Aarron Wierzbicki MRN: [id number] Date of Birth: 1945-04-14  Transition of Care [facility]) CM/SW Contact:  Geralynn Ochs, LCSW Phone Number: 09/14/2018, 2:13 PM   Clinical Narrative:   Nurse to call report to [phone], Room 603A    Final next level of care: Skilled Nursing Facility Barriers to Discharge: Barriers Resolved   Patient Goals and CMS Choice Patient states their goals for this hospitalization and ongoing recovery are:: Pt's sister states "to be able to walk without his walker; to go back to the way he was. CMS Medicare.gov Compare Post Acute Care list provided to:: Other (Comment Required)(CMS medicare.gov email to sister Florian Buff) Choice offered to / list presented to : Sibling  Discharge Placement PASRR number recieved: 09/13/18([phone])            Patient chooses bed at: [facility] Patient to be transferred to facility by: [facility] Name of family member notified: Beulah Patient and family notified of of transfer: 09/14/18  Discharge Plan and Services                  DME Agency: NA       HH Arranged: NA HH Agency: NA        Social Determinants of Health (SDOH) Interventions     Readmission Risk Interventions No flowsheet data found.

## 2018-09-29 ENCOUNTER — Inpatient Hospital Stay (HOSPITAL_COMMUNITY): Payer: Medicare HMO

## 2018-09-29 ENCOUNTER — Inpatient Hospital Stay (HOSPITAL_COMMUNITY)
Admission: EM | Admit: 2018-09-29 | Discharge: 2018-10-16 | DRG: 100 | Disposition: A | Discharge status: Skilled Nursing Facility | Payer: Medicare HMO | Attending: Internal Medicine | Admitting: Internal Medicine

## 2018-09-29 ENCOUNTER — Emergency Department (HOSPITAL_COMMUNITY): Payer: Medicare HMO

## 2018-09-29 ENCOUNTER — Encounter (HOSPITAL_COMMUNITY): Payer: Self-pay | Admitting: Emergency Medicine

## 2018-09-29 ENCOUNTER — Other Ambulatory Visit: Payer: Self-pay

## 2018-09-29 DIAGNOSIS — F200 Paranoid schizophrenia: Secondary | ICD-10-CM | POA: Diagnosis present

## 2018-09-29 DIAGNOSIS — I119 Hypertensive heart disease without heart failure: Secondary | ICD-10-CM | POA: Diagnosis not present

## 2018-09-29 DIAGNOSIS — I69322 Dysarthria following cerebral infarction: Secondary | ICD-10-CM | POA: Diagnosis not present

## 2018-09-29 DIAGNOSIS — R402123 Coma scale, eyes open, to pain, at hospital admission: Secondary | ICD-10-CM | POA: Diagnosis present

## 2018-09-29 DIAGNOSIS — Z888 Allergy status to other drugs, medicaments and biological substances status: Secondary | ICD-10-CM

## 2018-09-29 DIAGNOSIS — K219 Gastro-esophageal reflux disease without esophagitis: Secondary | ICD-10-CM | POA: Diagnosis present

## 2018-09-29 DIAGNOSIS — G40901 Epilepsy, unspecified, not intractable, with status epilepticus: Secondary | ICD-10-CM | POA: Diagnosis not present

## 2018-09-29 DIAGNOSIS — Z8249 Family history of ischemic heart disease and other diseases of the circulatory system: Secondary | ICD-10-CM

## 2018-09-29 DIAGNOSIS — I63512 Cerebral infarction due to unspecified occlusion or stenosis of left middle cerebral artery: Secondary | ICD-10-CM | POA: Diagnosis not present

## 2018-09-29 DIAGNOSIS — J302 Other seasonal allergic rhinitis: Secondary | ICD-10-CM | POA: Diagnosis present

## 2018-09-29 DIAGNOSIS — E876 Hypokalemia: Secondary | ICD-10-CM | POA: Diagnosis present

## 2018-09-29 DIAGNOSIS — I69320 Aphasia following cerebral infarction: Secondary | ICD-10-CM | POA: Diagnosis not present

## 2018-09-29 DIAGNOSIS — R414 Neurologic neglect syndrome: Secondary | ICD-10-CM | POA: Diagnosis present

## 2018-09-29 DIAGNOSIS — F209 Schizophrenia, unspecified: Secondary | ICD-10-CM | POA: Diagnosis not present

## 2018-09-29 DIAGNOSIS — Z79899 Other long term (current) drug therapy: Secondary | ICD-10-CM | POA: Diagnosis not present

## 2018-09-29 DIAGNOSIS — G40101 Localization-related (focal) (partial) symptomatic epilepsy and epileptic syndromes with simple partial seizures, not intractable, with status epilepticus: Principal | ICD-10-CM | POA: Diagnosis present

## 2018-09-29 DIAGNOSIS — R569 Unspecified convulsions: Secondary | ICD-10-CM | POA: Diagnosis present

## 2018-09-29 DIAGNOSIS — I69392 Facial weakness following cerebral infarction: Secondary | ICD-10-CM | POA: Diagnosis not present

## 2018-09-29 DIAGNOSIS — I69354 Hemiplegia and hemiparesis following cerebral infarction affecting left non-dominant side: Secondary | ICD-10-CM

## 2018-09-29 DIAGNOSIS — R1310 Dysphagia, unspecified: Secondary | ICD-10-CM

## 2018-09-29 DIAGNOSIS — Z6832 Body mass index (BMI) 32.0-32.9, adult: Secondary | ICD-10-CM

## 2018-09-29 DIAGNOSIS — R402333 Coma scale, best motor response, abnormal, at hospital admission: Secondary | ICD-10-CM | POA: Diagnosis present

## 2018-09-29 DIAGNOSIS — Z833 Family history of diabetes mellitus: Secondary | ICD-10-CM

## 2018-09-29 DIAGNOSIS — I110 Hypertensive heart disease with heart failure: Secondary | ICD-10-CM | POA: Diagnosis not present

## 2018-09-29 DIAGNOSIS — R39198 Other difficulties with micturition: Secondary | ICD-10-CM | POA: Diagnosis not present

## 2018-09-29 DIAGNOSIS — I10 Essential (primary) hypertension: Secondary | ICD-10-CM | POA: Diagnosis present

## 2018-09-29 DIAGNOSIS — R402223 Coma scale, best verbal response, incomprehensible words, at hospital admission: Secondary | ICD-10-CM | POA: Diagnosis present

## 2018-09-29 DIAGNOSIS — G40409 Other generalized epilepsy and epileptic syndromes, not intractable, without status epilepticus: Secondary | ICD-10-CM | POA: Diagnosis not present

## 2018-09-29 DIAGNOSIS — Z20828 Contact with and (suspected) exposure to other viral communicable diseases: Secondary | ICD-10-CM | POA: Diagnosis present

## 2018-09-29 DIAGNOSIS — Z7902 Long term (current) use of antithrombotics/antiplatelets: Secondary | ICD-10-CM | POA: Diagnosis not present

## 2018-09-29 DIAGNOSIS — Z8546 Personal history of malignant neoplasm of prostate: Secondary | ICD-10-CM | POA: Diagnosis not present

## 2018-09-29 DIAGNOSIS — E669 Obesity, unspecified: Secondary | ICD-10-CM | POA: Diagnosis present

## 2018-09-29 DIAGNOSIS — Z7951 Long term (current) use of inhaled steroids: Secondary | ICD-10-CM

## 2018-09-29 DIAGNOSIS — R471 Dysarthria and anarthria: Secondary | ICD-10-CM | POA: Diagnosis not present

## 2018-09-29 DIAGNOSIS — Z960 Presence of urogenital implants: Secondary | ICD-10-CM | POA: Diagnosis not present

## 2018-09-29 DIAGNOSIS — E785 Hyperlipidemia, unspecified: Secondary | ICD-10-CM | POA: Diagnosis not present

## 2018-09-29 DIAGNOSIS — F0390 Unspecified dementia without behavioral disturbance: Secondary | ICD-10-CM | POA: Diagnosis not present

## 2018-09-29 DIAGNOSIS — Z91048 Other nonmedicinal substance allergy status: Secondary | ICD-10-CM

## 2018-09-29 DIAGNOSIS — Z8349 Family history of other endocrine, nutritional and metabolic diseases: Secondary | ICD-10-CM

## 2018-09-29 DIAGNOSIS — Z7982 Long term (current) use of aspirin: Secondary | ICD-10-CM

## 2018-09-29 LAB — CBC
HCT: 38.6 % — ABNORMAL LOW (ref 39.0–52.0)
Hemoglobin: 12 g/dL — ABNORMAL LOW (ref 13.0–17.0)
MCH: 28.6 pg (ref 26.0–34.0)
MCHC: 31.1 g/dL (ref 30.0–36.0)
MCV: 91.9 fL (ref 80.0–100.0)
Platelets: 207 10*3/uL (ref 150–400)
RBC: 4.2 MIL/uL — ABNORMAL LOW (ref 4.22–5.81)
RDW: 14 % (ref 11.5–15.5)
WBC: 6.5 10*3/uL (ref 4.0–10.5)
nRBC: 0 % (ref 0.0–0.2)

## 2018-09-29 LAB — CBC WITH DIFFERENTIAL/PLATELET
Abs Immature Granulocytes: 0.02 10*3/uL (ref 0.00–0.07)
Basophils Absolute: 0 10*3/uL (ref 0.0–0.1)
Basophils Relative: 0 %
Eosinophils Absolute: 0 10*3/uL (ref 0.0–0.5)
Eosinophils Relative: 0 %
HCT: 39.4 % (ref 39.0–52.0)
Hemoglobin: 12.2 g/dL — ABNORMAL LOW (ref 13.0–17.0)
Immature Granulocytes: 0 %
Lymphocytes Relative: 8 %
Lymphs Abs: 0.5 10*3/uL — ABNORMAL LOW (ref 0.7–4.0)
MCH: 28.6 pg (ref 26.0–34.0)
MCHC: 31 g/dL (ref 30.0–36.0)
MCV: 92.3 fL (ref 80.0–100.0)
Monocytes Absolute: 0.3 10*3/uL (ref 0.1–1.0)
Monocytes Relative: 5 %
Neutro Abs: 5.8 10*3/uL (ref 1.7–7.7)
Neutrophils Relative %: 87 %
Platelets: 208 10*3/uL (ref 150–400)
RBC: 4.27 MIL/uL (ref 4.22–5.81)
RDW: 13.8 % (ref 11.5–15.5)
WBC: 6.7 10*3/uL (ref 4.0–10.5)
nRBC: 0 % (ref 0.0–0.2)

## 2018-09-29 LAB — BASIC METABOLIC PANEL
Anion gap: 11 (ref 5–15)
BUN: 8 mg/dL (ref 8–23)
CO2: 22 mmol/L (ref 22–32)
Calcium: 9.1 mg/dL (ref 8.9–10.3)
Chloride: 107 mmol/L (ref 98–111)
Creatinine, Ser: 0.79 mg/dL (ref 0.61–1.24)
GFR calc Af Amer: >60 mL/min (ref >60)
GFR calc non Af Amer: >60 mL/min (ref >60)
Glucose, Bld: 89 mg/dL (ref 70–99)
Potassium: 3.6 mmol/L (ref 3.5–5.1)
Sodium: 140 mmol/L (ref 135–145)

## 2018-09-29 LAB — CREATININE, SERUM
Creatinine, Ser: 0.8 mg/dL (ref 0.61–1.24)
GFR calc Af Amer: >60 mL/min (ref >60)
GFR calc non Af Amer: >60 mL/min (ref >60)

## 2018-09-29 LAB — LACTIC ACID, PLASMA
Lactic Acid, Venous: 1.6 mmol/L (ref 0.5–1.9)
Lactic Acid, Venous: 1.2 mmol/L (ref 0.5–1.9)

## 2018-09-29 LAB — RAPID URINE DRUG SCREEN, HOSP PERFORMED
Amphetamines: NOT DETECTED
Barbiturates: NOT DETECTED
Benzodiazepines: POSITIVE — AB
Cocaine: NOT DETECTED
Opiates: NOT DETECTED
Tetrahydrocannabinol: NOT DETECTED

## 2018-09-29 LAB — ETHANOL: Alcohol, Ethyl (B): <10 mg/dL (ref <10)

## 2018-09-29 LAB — GLUCOSE, CAPILLARY: Glucose-Capillary: 80 mg/dL (ref 70–99)

## 2018-09-29 LAB — CK: Total CK: 239 U/L (ref 49–397)

## 2018-09-29 LAB — SARS CORONAVIRUS 2 BY RT PCR (HOSPITAL ORDER, PERFORMED IN ~~LOC~~ HOSPITAL LAB): SARS Coronavirus 2: NEGATIVE

## 2018-09-29 LAB — VALPROIC ACID LEVEL: Valproic Acid Lvl: <10 ug/mL — ABNORMAL LOW (ref 50.0–100.0)

## 2018-09-29 LAB — CBG MONITORING, ED: Glucose-Capillary: 77 mg/dL (ref 70–99)

## 2018-09-29 MED ORDER — ENSURE ENLIVE PO LIQD
237.0000 mL | Freq: Two times a day (BID) | ORAL | Status: DC
  Administered 2018-10-01 – 2018-10-02 (×3): 237 mL via ORAL

## 2018-09-29 MED ORDER — LEVETIRACETAM IN NACL 1000 MG/100ML IV SOLN
1000.0000 mg | Freq: Once | INTRAVENOUS | Status: AC
  Administered 2018-09-29: 1000 mg via INTRAVENOUS
  Filled 2018-09-29: qty 100

## 2018-09-29 MED ORDER — CLOPIDOGREL BISULFATE 75 MG PO TABS
75.0000 mg | ORAL_TABLET | Freq: Every day | ORAL | Status: DC
  Administered 2018-09-30 – 2018-10-16 (×16): 75 mg via ORAL
  Filled 2018-09-29 (×17): qty 1

## 2018-09-29 MED ORDER — ATORVASTATIN CALCIUM 80 MG PO TABS
80.0000 mg | ORAL_TABLET | Freq: Every day | ORAL | Status: DC
  Administered 2018-09-30 – 2018-10-16 (×16): 80 mg via ORAL
  Filled 2018-09-29 (×17): qty 1

## 2018-09-29 MED ORDER — ASPIRIN 325 MG PO TABS
325.0000 mg | ORAL_TABLET | Freq: Every day | ORAL | Status: DC
  Administered 2018-09-29 – 2018-10-16 (×17): 325 mg via ORAL
  Filled 2018-09-29 (×18): qty 1

## 2018-09-29 MED ORDER — SODIUM CHLORIDE 0.9 % IV SOLN: 20.0000 mg/kg | Freq: Once | INTRAVENOUS | Status: DC

## 2018-09-29 MED ORDER — LORAZEPAM 2 MG/ML IJ SOLN
INTRAMUSCULAR | Status: AC
  Administered 2018-09-29: 1 mg
  Filled 2018-09-29: qty 1

## 2018-09-29 MED ORDER — PHENYTOIN SODIUM 50 MG/ML IJ SOLN
100.0000 mg | Freq: Three times a day (TID) | INTRAMUSCULAR | Status: DC
  Administered 2018-09-30 – 2018-10-02 (×7): 100 mg via INTRAVENOUS
  Filled 2018-09-29 (×7): qty 2

## 2018-09-29 MED ORDER — LORAZEPAM 2 MG/ML IJ SOLN
2.0000 mg | Freq: Once | INTRAMUSCULAR | Status: AC
  Administered 2018-09-29: 2 mg via INTRAVENOUS

## 2018-09-29 MED ORDER — LORAZEPAM 2 MG/ML IJ SOLN
1.0000 mg | Freq: Once | INTRAMUSCULAR | Status: AC
  Administered 2018-09-29: 1 mg via INTRAVENOUS

## 2018-09-29 MED ORDER — ASPIRIN 325 MG PO TABS: 325.0000 mg | ORAL_TABLET | Freq: Every day | ORAL | Status: DC

## 2018-09-29 MED ORDER — LORAZEPAM 2 MG/ML IJ SOLN
2.0000 mg | Freq: Once | INTRAMUSCULAR | Status: AC
  Administered 2018-09-29: 2 mg via INTRAVENOUS
  Filled 2018-09-29: qty 1

## 2018-09-29 MED ORDER — SODIUM CHLORIDE 0.9 % IV SOLN
20.0000 mg/kg | Freq: Once | INTRAVENOUS | Status: AC
  Administered 2018-09-29: 1950 mg via INTRAVENOUS
  Filled 2018-09-29: qty 39

## 2018-09-29 MED ORDER — LORAZEPAM 2 MG/ML IJ SOLN
INTRAMUSCULAR | Status: AC
  Filled 2018-09-29: qty 1

## 2018-09-29 MED ORDER — FLUTICASONE PROPIONATE 50 MCG/ACT NA SUSP
1.0000 | Freq: Every day | NASAL | Status: DC
  Administered 2018-09-29 – 2018-10-15 (×14): 1 via NASAL
  Filled 2018-09-29 (×2): qty 16

## 2018-09-29 MED ORDER — ENOXAPARIN SODIUM 40 MG/0.4ML ~~LOC~~ SOLN
40.0000 mg | SUBCUTANEOUS | Status: DC
  Administered 2018-09-29 – 2018-10-16 (×17): 40 mg via SUBCUTANEOUS
  Filled 2018-09-29 (×16): qty 0.4

## 2018-09-29 MED ORDER — METOPROLOL TARTRATE 50 MG PO TABS
50.0000 mg | ORAL_TABLET | Freq: Two times a day (BID) | ORAL | Status: DC
  Administered 2018-09-29 – 2018-10-16 (×31): 50 mg via ORAL
  Filled 2018-09-29 (×32): qty 1

## 2018-09-29 MED ORDER — VALPROATE SODIUM 500 MG/5ML IV SOLN
2000.0000 mg | Freq: Once | INTRAVENOUS | Status: DC
  Filled 2018-09-29: qty 20

## 2018-09-29 MED ORDER — ASPIRIN 300 MG RE SUPP
300.0000 mg | Freq: Every day | RECTAL | Status: DC
  Filled 2018-09-29 (×4): qty 1

## 2018-09-29 MED ORDER — SODIUM CHLORIDE 0.9% FLUSH
3.0000 mL | Freq: Two times a day (BID) | INTRAVENOUS | Status: DC
  Administered 2018-09-29 – 2018-10-13 (×15): 3 mL via INTRAVENOUS

## 2018-09-29 MED ORDER — LISINOPRIL 10 MG PO TABS
10.0000 mg | ORAL_TABLET | Freq: Every day | ORAL | Status: DC
  Administered 2018-09-30 – 2018-10-07 (×8): 10 mg via ORAL
  Filled 2018-09-29 (×8): qty 1

## 2018-09-29 NOTE — ED Notes (Signed)
ED TO INPATIENT HANDOFF REPORT  ED Nurse Name and Phone #: Nicki Reaper [place] Name/Age/Gender Ronald [name] 73 y.o. male Room/Bed: 032C/032C  Code Status   Code Status: Full Code  Home/SNF/Other Nursing Home  Is this baseline? No   Triage Complete: Triage complete  Chief Complaint Seizure 1st time  Triage Note Pt BIB [facility] for new onset, 1st time, seizures. EMS advised nursing home staff did their morning rounds when they found the pt on the floor for an unknown time. Upon EMS's arrival the pt was having a tonic clonic seizure. EMS advised they administered 5mg  of versed IM with seizure activity stopping. EMS advised vital signs were stable, CBG was 93., 12 lead unremarkable. EMS advised no response after the midazolam administration.   Upon arrival pt has a left sided deviation of bilateral eyes.    Allergies Allergies  Allergen Reactions  . Dust Mite Extract Other (See Comments)    Allergy symptoms   . Pollen Extract Other (See Comments)    Allergy symptoms   . Rye Grass Flower Pollen Extract [Gramineae Pollens] Other (See Comments)    Allergy Symptoms     Level of Care/Admitting Diagnosis ED Disposition    ED Disposition Condition [facility] Area: [place] [100100]  Level of Care: Progressive [102]  Covid Evaluation: Confirmed COVID Negative  Diagnosis: Seizure ([place]) [858850]  Admitting Physician: Cresenciano Lick  Attending Physician: Cresenciano Lick  Estimated length of stay: past midnight tomorrow  Certification:: I certify this patient will need inpatient services for at least 2 midnights  PT Class (Do Not Modify): Inpatient [101]  PT Acc Code (Do Not Modify): Private [1]       B Medical/Surgery History Past Medical History:  Diagnosis Date  . Cancer [facility])    Prostate  . Chicken pox   . Dementia ([place])   . Depression   . Heart disease   . Hyperlipemia   . Hypertension   . Measles   . Mumps    . 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 Stroke [facility])    Past Surgical History:  Procedure Laterality Date  . IR ANGIO EXTERNAL CAROTID SEL EXT CAROTID UNI R MOD SED  07/20/2016  . IR ANGIO INTRA EXTRACRAN SEL COM CAROTID INNOMINATE UNI L MOD SED  08/31/2018  . IR ANGIO INTRA EXTRACRAN SEL INTERNAL CAROTID BILAT MOD SED  07/20/2016  . IR ANGIO VERTEBRAL SEL SUBCLAVIAN INNOMINATE UNI L MOD SED  07/20/2016  . IR ANGIOGRAM EXTREMITY RIGHT  07/20/2016  . IR CT HEAD LTD  08/31/2018  . IR PERCUTANEOUS ART THROMBECTOMY/INFUSION INTRACRANIAL INC DIAG ANGIO  08/31/2018  . RADIOLOGY WITH ANESTHESIA N/A 08/31/2018   Procedure: IR WITH ANESTHESIA;  Surgeon: Radiologist, Medication, MD;  Location: [place];  Service: Radiology;  Laterality: N/A;     A IV Location/Drains/Wounds Patient Lines/Drains/Airways Status   Active Line/Drains/Airways    Name:   Placement date:   Placement time:   Site:   Days:   Peripheral IV 09/29/18 Posterior;Right Hand   09/29/18    -    Hand   less than 1   Peripheral IV 09/29/18 Right Antecubital   09/29/18    0745    Antecubital   less than 1   Incision (Closed) 08/31/18   08/31/18    1745     29   Pressure Injury 08/31/18 Toe (Comment  which one) Left;Anterior Stage I -  Intact skin with non-blanchable redness of a localized area usually over a bony prominence. great left toe    08/31/18    1745     29          Intake/Output Last 24 hours  Intake/Output Summary (Last 24 hours) at 09/29/2018 1247 Last data filed at 09/29/2018 0846 Gross per 24 hour  Intake 210 ml  Output -  Net 210 ml    Labs/Imaging Results for orders placed or performed during the hospital encounter of 09/29/18 (from the past 48 hour(s))  CBG monitoring, ED     Status: None   Collection Time: 09/29/18  7:32 AM  Result Value Ref Range   Glucose-Capillary 77 70 - 99 mg/dL  Basic metabolic panel     Status: None   Collection Time: 09/29/18  7:37 AM  Result Value Ref Range   Sodium 140  135 - 145 mmol/L   Potassium 3.6 3.5 - 5.1 mmol/L   Chloride 107 98 - 111 mmol/L   CO2 22 22 - 32 mmol/L   Glucose, Bld 89 70 - 99 mg/dL   BUN 8 8 - 23 mg/dL   Creatinine, Ser 0.79 0.61 - 1.24 mg/dL   Calcium 9.1 8.9 - 10.3 mg/dL   GFR calc non Af Amer >60 >60 mL/min   GFR calc Af Amer >60 >60 mL/min   Anion gap 11 5 - 15    Comment: Performed at [facility], [place] [address] [place], [place] [address]  CBC WITH DIFFERENTIAL     Status: Abnormal   Collection Time: 09/29/18  7:37 AM  Result Value Ref Range   WBC 6.7 4.0 - 10.5 K/uL   RBC 4.27 4.22 - 5.81 MIL/uL   Hemoglobin 12.2 (L) 13.0 - 17.0 g/dL   HCT 39.4 39.0 - 52.0 %   MCV 92.3 80.0 - 100.0 fL   MCH 28.6 26.0 - 34.0 pg   MCHC 31.0 30.0 - 36.0 g/dL   RDW 13.8 11.5 - 15.5 %   Platelets 208 150 - 400 K/uL   nRBC 0.0 0.0 - 0.2 %   Neutrophils Relative % 87 %   Neutro Abs 5.8 1.7 - 7.7 K/uL   Lymphocytes Relative 8 %   Lymphs Abs 0.5 (L) 0.7 - 4.0 K/uL   Monocytes Relative 5 %   Monocytes Absolute 0.3 0.1 - 1.0 K/uL   Eosinophils Relative 0 %   Eosinophils Absolute 0.0 0.0 - 0.5 K/uL   Basophils Relative 0 %   Basophils Absolute 0.0 0.0 - 0.1 K/uL   Immature Granulocytes 0 %   Abs Immature Granulocytes 0.02 0.00 - 0.07 K/uL    Comment: Performed at [facility] [address] [place], [place] [address]  Lactic acid, plasma     Status: None   Collection Time: 09/29/18  7:45 AM  Result Value Ref Range   Lactic Acid, Venous 1.6 0.5 - 1.9 mmol/L    Comment: Performed at [facility] [address], [place], [place] [address]  CK     Status: None   Collection Time: 09/29/18  7:54 AM  Result Value Ref Range   Total CK 239 49 - 397 U/L    Comment: Performed at [facility], [place] [address], [place], [place] [address]  SARS Coronavirus 2 (CEPHEID - Performed in [facility] hospital lab), Hosp Order     Status: None   Collection Time: 09/29/18  8:08 AM  Specimen: Nasopharyngeal Swab  Result Value  Ref Range   SARS Coronavirus 2 NEGATIVE NEGATIVE    Comment: (NOTE) If result is NEGATIVE SARS-CoV-2 target nucleic acids are NOT DETECTED. The SARS-CoV-2 RNA is generally detectable in upper and lower  respiratory specimens during the acute phase of infection. The lowest  concentration of SARS-CoV-2 viral copies this assay can detect is 250  copies / mL. A negative result does not preclude SARS-CoV-2 infection  and should not be used as the sole basis for treatment or other  patient management decisions.  A negative result may occur with  improper specimen collection / handling, submission of specimen other  than nasopharyngeal swab, presence of viral mutation(s) within the  areas targeted by this assay, and inadequate number of viral copies  (<250 copies / mL). A negative result must be combined with clinical  observations, patient history, and epidemiological information. If result is POSITIVE SARS-CoV-2 target nucleic acids are DETECTED. The SARS-CoV-2 RNA is generally detectable in upper and lower  respiratory specimens dur ing the acute phase of infection.  Positive  results are indicative of active infection with SARS-CoV-2.  Clinical  correlation with patient history and other diagnostic information is  necessary to determine patient infection status.  Positive results do  not rule out bacterial infection or co-infection with other viruses. If result is PRESUMPTIVE POSTIVE SARS-CoV-2 nucleic acids MAY BE PRESENT.   A presumptive positive result was obtained on the submitted specimen  and confirmed on repeat testing.  While 2019 novel coronavirus  (SARS-CoV-2) nucleic acids may be present in the submitted sample  additional confirmatory testing may be necessary for epidemiological  and / or clinical management purposes  to differentiate between  SARS-CoV-2 and other Sarbecovirus currently known to infect humans.  If clinically indicated additional testing with an alternate  test  methodology [phone]) is advised. The SARS-CoV-2 RNA is generally  detectable in upper and lower respiratory sp ecimens during the acute  phase of infection. The expected result is Negative. Fact Sheet for Patients:  StrictlyIdeas.no Fact Sheet for Healthcare Providers: BankingDealers.co.za This test is not yet approved or cleared by the [place] FDA and has been authorized for detection and/or diagnosis of SARS-CoV-2 by FDA under an Emergency Use Authorization (EUA).  This EUA will remain in effect (meaning this test can be used) for the duration of the COVID-19 declaration under Section 564(b)(1) of the Act, 21 U.S.C. section 360bbb-3(b)(1), unless the authorization is terminated or revoked sooner. Performed at [facility], [place] [address] [place], [place] [address]   Ethanol     Status: None   Collection Time: 09/29/18  8:19 AM  Result Value Ref Range   Alcohol, Ethyl (B) <10 <10 mg/dL    Comment: (NOTE) Lowest detectable limit for serum alcohol is 10 mg/dL. For medical purposes only. Performed at [facility], [place] [address], [place], [place] [address]   Rapid urine drug screen (hospital performed)     Status: Abnormal   Collection Time: 09/29/18  9:20 AM  Result Value Ref Range   Opiates NONE DETECTED NONE DETECTED   Cocaine NONE DETECTED NONE DETECTED   Benzodiazepines POSITIVE (A) NONE DETECTED   Amphetamines NONE DETECTED NONE DETECTED   Tetrahydrocannabinol NONE DETECTED NONE DETECTED   Barbiturates NONE DETECTED NONE DETECTED    Comment: (NOTE) DRUG SCREEN FOR MEDICAL PURPOSES ONLY.  IF CONFIRMATION IS NEEDED FOR ANY PURPOSE, NOTIFY LAB WITHIN 5 DAYS. LOWEST DETECTABLE LIMITS FOR URINE DRUG  SCREEN Drug Class                     Cutoff (ng/mL) Amphetamine and metabolites    1000 Barbiturate and metabolites    200 Benzodiazepine                 454 Tricyclics and metabolites     300 Opiates  and metabolites        300 Cocaine and metabolites        300 THC                            50 Performed at [facility], [place] [address], [place], [place] [address]    Ct Head Wo Contrast  Result Date: 09/29/2018 CLINICAL DATA:  Seizure.  Recent stroke. EXAM: CT HEAD WITHOUT CONTRAST CT CERVICAL SPINE WITHOUT CONTRAST TECHNIQUE: Multidetector CT imaging of the head and cervical spine was performed following the standard protocol without intravenous contrast. Multiplanar CT image reconstructions of the cervical spine were also generated. COMPARISON:  MR brain dated September 01, 2018. CT head and neck dated August 31, 2018. FINDINGS: CT HEAD FINDINGS Brain: No cortically based acute infarct identified. Chronic right parietal lobe encephalomalacia again noted. No hemorrhage, hydrocephalus, extra-axial collection or mass lesion/mass effect. Vascular: Atherosclerotic vascular calcification of the carotid siphons. No hyperdense vessel. Skull: Negative for fracture or focal lesion. Poor dentition with multiple dental caries and periapical abscesses. Sinuses/Orbits: No acute finding. Other: None. CT CERVICAL SPINE FINDINGS Alignment: Normal. Skull base and vertebrae: No acute fracture. No primary bone lesion or focal pathologic process. Soft tissues and spinal canal: No prevertebral fluid or swelling. No visible canal hematoma. Disc levels: Scattered mild endplate spurring with relative preservation of the disc heights. Upper chest: Negative. Other: Nasopharyngeal airway terminating in the oropharynx. IMPRESSION: 1. Stable non-contrast CT appearance of the brain. Chronic right parietal lobe encephalomalacia. 2.  No acute cervical spine fracture. Electronically Signed   By: Titus Dubin M.D.   On: 09/29/2018 08:52   Ct Cervical Spine Wo Contrast  Result Date: 09/29/2018 CLINICAL DATA:  Seizure.  Recent stroke. EXAM: CT HEAD WITHOUT CONTRAST CT CERVICAL SPINE WITHOUT CONTRAST TECHNIQUE: Multidetector CT imaging  of the head and cervical spine was performed following the standard protocol without intravenous contrast. Multiplanar CT image reconstructions of the cervical spine were also generated. COMPARISON:  MR brain dated September 01, 2018. CT head and neck dated August 31, 2018. FINDINGS: CT HEAD FINDINGS Brain: No cortically based acute infarct identified. Chronic right parietal lobe encephalomalacia again noted. No hemorrhage, hydrocephalus, extra-axial collection or mass lesion/mass effect. Vascular: Atherosclerotic vascular calcification of the carotid siphons. No hyperdense vessel. Skull: Negative for fracture or focal lesion. Poor dentition with multiple dental caries and periapical abscesses. Sinuses/Orbits: No acute finding. Other: None. CT CERVICAL SPINE FINDINGS Alignment: Normal. Skull base and vertebrae: No acute fracture. No primary bone lesion or focal pathologic process. Soft tissues and spinal canal: No prevertebral fluid or swelling. No visible canal hematoma. Disc levels: Scattered mild endplate spurring with relative preservation of the disc heights. Upper chest: Negative. Other: Nasopharyngeal airway terminating in the oropharynx. IMPRESSION: 1. Stable non-contrast CT appearance of the brain. Chronic right parietal lobe encephalomalacia. 2.  No acute cervical spine fracture. Electronically Signed   By: Titus Dubin M.D.   On: 09/29/2018 08:52   Dg Chest Portable 1 View  Result Date: 09/29/2018 CLINICAL DATA:  Seizure  this morning.  Chest congestion. EXAM: PORTABLE CHEST 1 VIEW COMPARISON:  09/03/2018 FINDINGS: The cardiac silhouette, mediastinal and hilar contours are within normal limits and stable. Stable mild tortuosity and calcification of the thoracic aorta. The lungs are clear. No pleural effusion. No worrisome pulmonary lesions. The bony thorax is intact. IMPRESSION: No acute cardiopulmonary findings. Electronically Signed   By: Marijo Sanes M.D.   On: 09/29/2018 09:22    Pending  Labs Unresulted Labs (From admission, onward)    Start     Ordered   10/06/18 0500  Creatinine, serum  (enoxaparin (LOVENOX)    CrCl >/= 30 ml/min)  Weekly,   R    Comments: while on enoxaparin therapy    09/29/18 1059   09/30/18 0500  Comprehensive metabolic panel  Tomorrow morning,   R     09/29/18 1059   09/30/18 0500  CBC  Tomorrow morning,   R     09/29/18 1059   09/29/18 1057  CBC  (enoxaparin (LOVENOX)    CrCl >/= 30 ml/min)  Once,   STAT    Comments: Baseline for enoxaparin therapy IF NOT ALREADY DRAWN.  Notify MD if PLT < 100 K.    09/29/18 1059   09/29/18 1057  Creatinine, serum  (enoxaparin (LOVENOX)    CrCl >/= 30 ml/min)  Once,   STAT    Comments: Baseline for enoxaparin therapy IF NOT ALREADY DRAWN.    09/29/18 1059   09/29/18 0745  Lactic acid, plasma  Now then every 2 hours,   STAT     09/29/18 0744          Vitals/Pain Today's Vitals   09/29/18 1145 09/29/18 1200 09/29/18 1215 09/29/18 1230  BP: (!) 153/75 (!) 157/74 130/66 126/71  Pulse: 66 70 72 71  Resp: 14 17 (!) 21 19  Temp:      TempSrc:      SpO2: 100% 100% 99% 98%  Weight:      Height:        Isolation Precautions No active isolations  Medications Medications  valproate (DEPACON) 2,000 mg in dextrose 5 % 50 mL IVPB (0 mg Intravenous Hold 09/29/18 0857)  enoxaparin (LOVENOX) injection 40 mg (has no administration in time range)  sodium chloride flush (NS) 0.9 % injection 3 mL (has no administration in time range)  LORazepam (ATIVAN) 2 MG/ML injection (1 mg  Given 09/29/18 0731)  levETIRAcetam (KEPPRA) IVPB 1000 mg/100 mL premix (0 mg Intravenous Stopped 09/29/18 0801)  LORazepam (ATIVAN) injection 1 mg (1 mg Intravenous Given 09/29/18 0740)  levETIRAcetam (KEPPRA) IVPB 1000 mg/100 mL premix (0 mg Intravenous Stopped 09/29/18 0846)  LORazepam (ATIVAN) injection 2 mg (2 mg Intravenous Given 09/29/18 8850)    Mobility walks High fall risk   Focused Assessments Neuro Assessment Handoff:  Swallow  screen pass? unable to test Cardiac Rhythm: Normal sinus rhythm   Last date known well: 09/28/18 Last time known well: 2200 Neuro Assessment: Exceptions to WDL Neuro Checks:      Last Documented NIHSS Modified Score:   Has TPA been given? no If patient is a Neuro Trauma and patient is going to OR before floor call report to [place] nurse: [phone] or [phone]     R Recommendations: See Admitting Provider Note  Report given to:   Additional Notes:

## 2018-09-29 NOTE — Procedures (Signed)
ELECTROENCEPHALOGRAM REPORT    Patient: Ronald [name]       Room #: 032C EEG No. ID: [id number] Age: 73 y.o.        Sex: male Referring Physician: Daryll Drown Report Date:  09/29/2018        Interpreting Physician: Alexis Goodell  History: Arty Lantzy is an 73 y.o. male with seizure activity  Medications:  Depacon  Conditions of Recording:  This is a 21 channel routine scalp EEG performed with bipolar and monopolar montages arranged in accordance to the international 10/20 system of electrode placement. One channel was dedicated to EKG recording.  The patient is in the asleep state.  Description:  The patient appears asleep for the majority of the recording.  The background is asymmetric and of lower voltage over the right hemisphere and this is maintained throughout the recording.  The background throughout is slow and poorly organized consisting mostly of a polymorphic delta activity.  There are superimposed symmetrical bursts of beta activity with the appearance of sleep spindles.   No epileptiform activity is noted.   The patient is stimulated during the recording with activation of the background noted. Hyperventilation and intermittent photic stimulation were not performed.  IMPRESSION: The patient appears asleep throughout the majority of the recording.  Amplitude asymmetry is noted with decreased amplitude over the right consistent with the patient's imaging.  No epileptiform activity is noted.     Alexis Goodell, MD Neurology [phone] 09/29/2018, 11:11 AM

## 2018-09-29 NOTE — Progress Notes (Signed)
Patient admitted to [facility] 5w21 from ED for seizures. Patient alert oriented to self and date. Slurred speech but able to understand most words. When patient was being wheeled to unit on stretcher started vomiting, brown/red secretions. Once in room was suctioned with yaunkers. ET tube removed from right nostril. Patient skin warm, dry, and intact except small scab on left great toe. Patient has slight tremors both arms but greater in left arm. No grip in left hand and unable to move left arm/leg. Patient is following commands placed on tele monitor and contiuous pulse ox. Seizure and safety precautions in place. Oriented to room.

## 2018-09-29 NOTE — Progress Notes (Signed)
Reassessed at 9:00 PM.   BP (!) 179/75 (BP Location: Right Arm)   Pulse 89   Temp 97.9 F (36.6 C) (Rectal)   Resp 20   Ht 5\' 8"  (1.727 m)   Wt 97.5 kg   SpO2 97%   BMI 32.68 kg/m   The patient is awake and alert, with dysarthric and at times nonsensical speech. Continuous left upper extremity jerking is noted.   A/R: 1. Loading with fosphenytoin 20 mg/kg x 1 now, then continuing Dilantin at 100 mg IV TID 2. IV Ativan 2 mg x 1 now. 3. Continue scheduled Keppra 4. Discussed with Dr. Rory Percy, who will see the patient in follow up in 2 hours for resolution of seizure activity. If continue seizure activity most likely will start a scheduled benzodiazepine, reload with valproic acid or load with Vimpat, but will defer to Dr. Rory Percy for final decision.  5. Obtaining VPA level now.   Electronically signed: Dr. Kerney Elbe

## 2018-09-29 NOTE — ED Provider Notes (Signed)
[facility] EMERGENCY DEPARTMENT Provider Note   CSN: [id number] Arrival date & time: 09/29/18  2952    History   Chief Complaint Chief Complaint  Patient presents with  . Seizures    Level 5 caveat due to altered mental status   HPI Ronald [name] is a 73 y.o. male patient with history of prostate cancer, dementia, heart disease, hyperlipidemia, hypertension, measles, mumps, schizophrenia, prior CVA presenting for evaluation of acute onset, persistent seizure-like activity.  He was found down at his nursing home sometime this morning as a staff was doing their morning rounds.  The patient was on the floor for an unknown amount of time and was exhibiting generalized tonic-clonic seizure-like activity.  He was given 5 mg of Versed IM with EMS with cessation of seizure-like activity.  Just prior to my assessment he began to exhibit focal seizure-like activity with left upper extremity convulsions.  He exhibits a leftward gaze.  He has not been responsive since administration of the Versed, just exhibited sonorous respirations.   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  He is currently on Plavix.     The history is provided by the patient.    Past Medical History:  Diagnosis Date  . Cancer [facility])    Prostate  . Chicken pox   . Dementia ([place])   . Depression   . Heart disease   . Hyperlipemia   . Hypertension   . Measles   . Mumps   . 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 Stroke [facility])     Patient Active Problem List   Diagnosis Date Noted  . Seizure ([place]) 09/29/2018  . Paranoid schizophrenia ([place])   . Pressure injury of skin 09/01/2018  . Stroke [facility]) - R MCA s/p tPA and angioplasty R MCA. 08/31/2018  . Internal carotid artery stenosis, right 08/31/2018  . Hypotension  05/26/2018  . Right carotid artery occlusion 05/26/2018  . History of completed stroke 05/26/2018  . Obesity 05/26/2018  . TIA (transient ischemic attack) 05/23/2018  . Cerebrovascular accident (CVA) due to occlusion of right carotid artery ([place])   . Cerebral thrombosis with cerebral infarction 07/20/2016  . Non-traumatic rhabdomyolysis   . Bronchitis 07/15/2016  . Dementia ([place]) 07/15/2016  . CAP (community acquired pneumonia) 07/15/2016  . Acute left hemiparesis ([place]) 07/15/2016  . Abnormal urinalysis 07/15/2016  . Effusion of left knee 07/15/2016  . Elevated AST (SGOT) 07/15/2016  . Elevated troponin 07/15/2016  . Lobar pneumonia ([place])   . Prostate cancer ([place]) 10/21/2015  . Other seasonal allergic rhinitis 10/21/2015  . Essential hypertension 01/16/2014  . Bipolar 1 disorder ([place]) 08/02/2013  . GERD (gastroesophageal reflux disease) 07/04/2013  . Elevated PSA, less than 10 ng/ml 07/04/2013  . BPH with elevated PSA 02/28/2013  . Encounter for Medicare annual wellness exam 02/28/2013  . Hyperlipidemia 02/28/2013    Past Surgical History:  Procedure Laterality Date  . IR ANGIO EXTERNAL CAROTID SEL EXT CAROTID UNI R MOD SED  07/20/2016  . IR ANGIO INTRA EXTRACRAN SEL COM CAROTID INNOMINATE UNI L MOD SED  08/31/2018  . IR ANGIO INTRA EXTRACRAN SEL INTERNAL CAROTID BILAT MOD SED  07/20/2016  . IR ANGIO VERTEBRAL SEL SUBCLAVIAN INNOMINATE UNI L MOD SED  07/20/2016  . IR ANGIOGRAM EXTREMITY RIGHT  07/20/2016  . IR CT HEAD LTD  08/31/2018  . IR PERCUTANEOUS ART THROMBECTOMY/INFUSION INTRACRANIAL INC DIAG ANGIO  08/31/2018  . RADIOLOGY WITH ANESTHESIA N/A 08/31/2018   Procedure: IR WITH ANESTHESIA;  Surgeon: Radiologist, Medication, MD;  Location: [place];  Service: Radiology;  Laterality: N/A;        Home Medications    Prior to Admission medications   Medication Sig Start Date End Date Taking? Authorizing Provider  aspirin EC 325 MG EC tablet Take 1 tablet (325 mg total) by mouth daily.  09/15/18  Yes Donzetta Starch, NP  atorvastatin (LIPITOR) 80 MG tablet Take 80 mg by mouth daily. 01/23/18  Yes [provider]  Calcium-Vitamin D 600-200 MG-UNIT per tablet Take 1 tablet by mouth daily.   Yes [provider]  clopidogrel (PLAVIX) 75 MG tablet Take 1 tablet (75 mg total) by mouth daily. 07/22/16  Yes Jani Gravel, MD  divalproex (DEPAKOTE SPRINKLE) 125 MG capsule Take 125 mg by mouth 2 (two) times daily.   Yes [provider]  fluticasone (FLONASE) 50 MCG/ACT nasal spray Place 1 spray into both nostrils daily.   Yes [provider]  lisinopril (ZESTRIL) 10 MG tablet Take 1 tablet (10 mg total) by mouth daily. 09/15/18  Yes Donzetta Starch, NP  LORazepam (ATIVAN) 0.5 MG tablet Take 0.5 mg by mouth every 6 (six) hours as needed for anxiety.   Yes [provider]  metoprolol tartrate (LOPRESSOR) 50 MG tablet Take 1 tablet (50 mg total) by mouth 2 (two) times daily. 09/14/18  Yes Donzetta Starch, NP  Vitamin D, Cholecalciferol, 50 MCG (2000 UT) CAPS Take 2,000 Units by mouth daily.   Yes [provider]  feeding supplement, ENSURE ENLIVE, (ENSURE ENLIVE) LIQD Take 237 mLs by mouth 2 (two) times daily between meals. 09/14/18   Donzetta Starch, NP    Family History Family History  Problem Relation Age of Onset  . Hypertension Father   . Hyperlipidemia Father   . Congestive Heart Failure Father 18       Deceased-1995  . Diabetes Mother   . Kidney failure Mother 8       Deceased-2005  . Hypertension Mother   . Congestive Heart Failure Maternal Grandmother   . Heart failure Paternal Grandmother   . Heart attack Paternal Grandmother     Social History Social History   Tobacco Use  . Smoking status: Never Smoker  . Smokeless tobacco: Never Used  Substance Use Topics  . Alcohol use: No  . Drug use: No     Allergies   Dust mite extract, Pollen extract, and Rye grass flower pollen extract [gramineae pollens]   Review of  Systems Review of Systems  Unable to perform ROS: Mental status change  All other systems reviewed and are negative.    Physical Exam Updated Vital Signs BP (!) 157/74   Pulse 70   Temp 97.9 F (36.6 C) (Rectal)   Resp 17   Ht 5\' 8"  (1.727 m)   Wt 97.5 kg   SpO2 100%   BMI 32.68 kg/m   Physical Exam Vitals signs and nursing note reviewed.  Constitutional:      General: He is not in acute distress.    Appearance: He is well-developed.     Comments: Sonorous respirations, minimally responsive to pain  HENT:     Head: Normocephalic and atraumatic.  Eyes:     General:        Right eye: No discharge.  Left eye: No discharge.     Conjunctiva/sclera: Conjunctivae normal.     Pupils: Pupils are equal, round, and reactive to light.     Comments: Pupils initially pinpoint, minimally reactive to light, equal bilaterally; fixed leftward gaze  Neck:     Vascular: No JVD.     Trachea: No tracheal deviation.     Comments: C-collar applied Cardiovascular:     Rate and Rhythm: Normal rate and regular rhythm.  Pulmonary:     Effort: Pulmonary effort is normal.     Comments: Transmitted upper airway sounds.  NPA applied. Abdominal:     General: Bowel sounds are normal. There is no distension.     Palpations: Abdomen is soft.     Tenderness: There is no abdominal tenderness. There is no guarding.  Skin:    General: Skin is warm and dry.     Capillary Refill: Capillary refill takes less than 2 seconds.     Findings: No erythema.  Neurological:     GCS: GCS eye subscore is 2. GCS verbal subscore is 2. GCS motor subscore is 3.     Comments: Responsive to painful stimuli.  Otherwise does not follow commands.  Focal convulsive activity of the left upper extremity noted.  Psychiatric:        Behavior: Behavior normal.      ED Treatments / Results  Labs (all labs ordered are listed, but only abnormal results are displayed) Labs Reviewed  CBC WITH DIFFERENTIAL/PLATELET -  Abnormal; Notable for the following components:      Result Value   Hemoglobin 12.2 (*)    Lymphs Abs 0.5 (*)    All other components within normal limits  RAPID URINE DRUG SCREEN, HOSP PERFORMED - Abnormal; Notable for the following components:   Benzodiazepines POSITIVE (*)    All other components within normal limits  SARS CORONAVIRUS 2 (HOSPITAL ORDER, [facility])  BASIC METABOLIC PANEL  LACTIC ACID, PLASMA  CK  ETHANOL  LACTIC ACID, PLASMA  CBC  CREATININE, SERUM  CBG MONITORING, ED    EKG EKG Interpretation  Date/Time:  Friday September 29 2018 07:35:51 EDT Ventricular Rate:  104 PR Interval:    QRS Duration: 128 QT Interval:  390 QTC Calculation: 513 R Axis:   67 Text Interpretation:  Sinus tachycardia Multiform ventricular premature complexes Nonspecific intraventricular conduction delay Confirmed by Ripley Fraise [phone]), editor Philomena Doheny [phone]) on 09/29/2018 7:49:46 AM   Radiology Ct Head Wo Contrast  Result Date: 09/29/2018 CLINICAL DATA:  Seizure.  Recent stroke. EXAM: CT HEAD WITHOUT CONTRAST CT CERVICAL SPINE WITHOUT CONTRAST TECHNIQUE: Multidetector CT imaging of the head and cervical spine was performed following the standard protocol without intravenous contrast. Multiplanar CT image reconstructions of the cervical spine were also generated. COMPARISON:  MR brain dated September 01, 2018. CT head and neck dated August 31, 2018. FINDINGS: CT HEAD FINDINGS Brain: No cortically based acute infarct identified. Chronic right parietal lobe encephalomalacia again noted. No hemorrhage, hydrocephalus, extra-axial collection or mass lesion/mass effect. Vascular: Atherosclerotic vascular calcification of the carotid siphons. No hyperdense vessel. Skull: Negative for fracture or focal lesion. Poor dentition with multiple dental caries and periapical abscesses. Sinuses/Orbits: No acute finding. Other: None. CT CERVICAL SPINE FINDINGS Alignment: Normal. Skull  base and vertebrae: No acute fracture. No primary bone lesion or focal pathologic process. Soft tissues and spinal canal: No prevertebral fluid or swelling. No visible canal hematoma. Disc levels: Scattered mild endplate spurring with relative preservation  of the disc heights. Upper chest: Negative. Other: Nasopharyngeal airway terminating in the oropharynx. IMPRESSION: 1. Stable non-contrast CT appearance of the brain. Chronic right parietal lobe encephalomalacia. 2.  No acute cervical spine fracture. Electronically Signed   By: Titus Dubin M.D.   On: 09/29/2018 08:52   Ct Cervical Spine Wo Contrast  Result Date: 09/29/2018 CLINICAL DATA:  Seizure.  Recent stroke. EXAM: CT HEAD WITHOUT CONTRAST CT CERVICAL SPINE WITHOUT CONTRAST TECHNIQUE: Multidetector CT imaging of the head and cervical spine was performed following the standard protocol without intravenous contrast. Multiplanar CT image reconstructions of the cervical spine were also generated. COMPARISON:  MR brain dated September 01, 2018. CT head and neck dated August 31, 2018. FINDINGS: CT HEAD FINDINGS Brain: No cortically based acute infarct identified. Chronic right parietal lobe encephalomalacia again noted. No hemorrhage, hydrocephalus, extra-axial collection or mass lesion/mass effect. Vascular: Atherosclerotic vascular calcification of the carotid siphons. No hyperdense vessel. Skull: Negative for fracture or focal lesion. Poor dentition with multiple dental caries and periapical abscesses. Sinuses/Orbits: No acute finding. Other: None. CT CERVICAL SPINE FINDINGS Alignment: Normal. Skull base and vertebrae: No acute fracture. No primary bone lesion or focal pathologic process. Soft tissues and spinal canal: No prevertebral fluid or swelling. No visible canal hematoma. Disc levels: Scattered mild endplate spurring with relative preservation of the disc heights. Upper chest: Negative. Other: Nasopharyngeal airway terminating in the oropharynx. IMPRESSION:  1. Stable non-contrast CT appearance of the brain. Chronic right parietal lobe encephalomalacia. 2.  No acute cervical spine fracture. Electronically Signed   By: Titus Dubin M.D.   On: 09/29/2018 08:52   Dg Chest Portable 1 View  Result Date: 09/29/2018 CLINICAL DATA:  Seizure this morning.  Chest congestion. EXAM: PORTABLE CHEST 1 VIEW COMPARISON:  09/03/2018 FINDINGS: The cardiac silhouette, mediastinal and hilar contours are within normal limits and stable. Stable mild tortuosity and calcification of the thoracic aorta. The lungs are clear. No pleural effusion. No worrisome pulmonary lesions. The bony thorax is intact. IMPRESSION: No acute cardiopulmonary findings. Electronically Signed   By: Marijo Sanes M.D.   On: 09/29/2018 09:22    Procedures .Critical Care Performed by: Renita Papa, PA-C Authorized by: Renita Papa, PA-C   Critical care provider statement:    Critical care time (minutes):  40   Critical care was necessary to treat or prevent imminent or life-threatening deterioration of the following conditions:  CNS failure or compromise   Critical care was time spent personally by me on the following activities:  Discussions with consultants, evaluation of patient's response to treatment, examination of patient, ordering and performing treatments and interventions, ordering and review of laboratory studies, ordering and review of radiographic studies, pulse oximetry, re-evaluation of patient's condition, obtaining history from patient or surrogate and review of old charts   (including critical care time)  Medications Ordered in ED Medications  valproate (DEPACON) 2,000 mg in dextrose 5 % 50 mL IVPB (0 mg Intravenous Hold 09/29/18 0857)  enoxaparin (LOVENOX) injection 40 mg (has no administration in time range)  sodium chloride flush (NS) 0.9 % injection 3 mL (has no administration in time range)  LORazepam (ATIVAN) 2 MG/ML injection (1 mg  Given 09/29/18 0731)  levETIRAcetam  (KEPPRA) IVPB 1000 mg/100 mL premix (0 mg Intravenous Stopped 09/29/18 0801)  LORazepam (ATIVAN) injection 1 mg (1 mg Intravenous Given 09/29/18 0740)  levETIRAcetam (KEPPRA) IVPB 1000 mg/100 mL premix (0 mg Intravenous Stopped 09/29/18 0846)  LORazepam (ATIVAN) injection 2 mg (2 mg  Intravenous Given 09/29/18 0828)     Initial Impression / Assessment and Plan / ED Course  I have reviewed the triage vital signs and the nursing notes.  Pertinent labs & imaging results that were available during my care of the patient were reviewed by me and considered in my medical decision making (see chart for details).        Patient presents from facility for seizure-like activity.  He is afebrile, hypertensive in the ED.  Minimally responsive to pain.  Exhibits focal seizure-like activity with muscle twitches of the left upper extremity.  Nasopharyngeal airway placed with stable SPO2 saturations and no increased work of breathing.  He was given 2 mg Ativan and 1000 mg loading dose of IV Keppra.  Head CT was obtained which shows subacute stroke in the right parietal lobe.  Images reviewed by Dr. Cheral Marker with neurology who I spoke with in consultation.  He will see and assess the patient emergently in the ED.  Patient continues to display twitches of the left upper extremity.  Lab work reviewed by me shows no leukocytosis, mild anemia, no metabolic derangements.  He is COVID negative.  EKG shows sinus tachycardia.  EEG shows no further seizure-like activity.  Dr. Cheral Marker has seen and evaluated the patient.  He recommends additional 2 mg Ativan and 1 g Keppra, Depakote loading dose, with possible schedule Depakote based on response to Depakote bolus and second bolus of Keppra.  He has spoken with the patient's sister who states that the patient does have a history of seizures and takes Depakote at the facility and is prescribed Ativan for breakthrough seizures.  Dr. Cheral Marker recommends admission to the hospital for further  evaluation and management.  Internal medicine teaching service to admit.  Patient seen and evaluated by Dr. Zenia Resides who agrees with assessment and plan at this time.   Final Clinical Impressions(s) / ED Diagnoses   Final diagnoses:  Seizure-like activity [facility])    ED Discharge Orders    None       Debroah Baller 09/29/18 1222    Lacretia Leigh, MD 10/01/18 [phone]

## 2018-09-29 NOTE — ED Notes (Signed)
Spoke with patient's sister who advised the facility told her the pt seized with them for 30 minutes prior to them calling 9-1-1 and administering IM medication. Sister advised the pt does take Depakote at the facility and is prescribed Ativan for break through seizures.

## 2018-09-29 NOTE — ED Notes (Signed)
Attempted report and secretary advised the receiving RN was not answering her phone.

## 2018-09-29 NOTE — Plan of Care (Signed)
  Problem: Medication: Goal: Risk for medication side effects will decrease Outcome: Progressing   Problem: Clinical Measurements: Goal: Complications related to the disease process, condition or treatment will be avoided or minimized Outcome: Progressing Goal: Diagnostic test results will improve Outcome: Progressing   Problem: Safety: Goal: Verbalization of understanding the information provided will improve Outcome: Progressing

## 2018-09-29 NOTE — ED Provider Notes (Signed)
Medical screening examination/treatment/procedure(s) were conducted as a shared visit with non-physician practitioner(s) and myself.  I personally evaluated the patient during the encounter.    73 year old male presents after witnessed seizure by EMS.  Patient had been found by nursing home staff on the floor.  Was given Versed.  Patient had additional seizure here characterizes left-sided tonic-clonic activity.  Eyes were deviated to the left as well.  Suspect that patient may have intracranial process.  Patient given Ativan x2 along with loaded with Keppra.  Patient's imaging is pending at this time.     Lacretia Leigh, MD 09/29/18 [phone]

## 2018-09-29 NOTE — ED Notes (Signed)
Patient transported to MRI 

## 2018-09-29 NOTE — Progress Notes (Signed)
Sister called, per sister Florian Buff, patient came from SNF for rehab at [facility], they are unable to hold patient's bed and sister wants to make sure that patient resumes rehab once safe.

## 2018-09-29 NOTE — ED Triage Notes (Signed)
Pt BIB [facility] for new onset, 1st time, seizures. EMS advised nursing home staff did their morning rounds when they found the pt on the floor for an unknown time. Upon EMS's arrival the pt was having a tonic clonic seizure. EMS advised they administered 5mg  of versed IM with seizure activity stopping. EMS advised vital signs were stable, CBG was 93., 12 lead unremarkable. EMS advised no response after the midazolam administration.   Upon arrival pt has a left sided deviation of bilateral eyes.

## 2018-09-29 NOTE — H&P (Signed)
Date: 09/29/2018               Patient Name:  Ronald [name] MRN: [id number]  DOB: 08/31/1945 Age / Sex: 73 y.o., male   PCP: Colonel Bald, MD         Medical Service: Internal Medicine Teaching Service         Attending Physician: Dr. Sid Falcon, MD    First Contact: Dr. [name] Pager: [phone]  Second Contact: Dr. Pearson Grippe Pager: [phone]       After Hours (After 5p/  First Contact Pager: [phone]  weekends / holidays): Second Contact Pager: [phone]   Chief Complaint: Tonic clonic seizure secondary to stroke   History of Present Illness: History cannot be obtained from the patient since he is currently altered. Ronald [name] is a 73 y.o male with a PMHx significant of prostate cancer, dementia, heart disease, hyperlipidemia, HTN, measles, mumps, schizophrenia, multiple TIA, stroke (most recent R MCA infarct w/ L sided deficits on 6/4) who lives in a SNF who presents to the hospital for a tonic clonic seizure. He was initially found on the floor by nurses showing seizure like activity. Per nursing, the patient's sister says he seized for about 30 minutes prior till EMS arrival. He was given 5 mg of Versed IM once EMS arrived which ended the seizure like activity. When he arrived at the ED, he started having seizure like activity again, exhibiting left sided tonic-clonic jerks. He also had a leftward deviated gaze.  He was given 5mg  total of Ativan and 2000 mg IV Keppra load. Neurology also evaluated and subsequently performed an EEG which did not show any more seizure activity.    Of note, the patient was recently discharged from the hospital on 09/14/2018 for L MCA stroke for which he received tPA and angioplasty of the R terminal ICA. CT head on today's admission showed R parietal lobe hypodensity consistent with a subacute stroke. On the previous hospitalization he was discharged altert and oriented x3, but had dysarthria, L sided facial droop, L sided weakness  and L sided increased tone.  Meds:  Current Meds  Medication Sig  . aspirin EC 325 MG EC tablet Take 1 tablet (325 mg total) by mouth daily.  Marland Kitchen atorvastatin (LIPITOR) 80 MG tablet Take 80 mg by mouth daily.  . Calcium-Vitamin D 600-200 MG-UNIT per tablet Take 1 tablet by mouth daily.  . clopidogrel (PLAVIX) 75 MG tablet Take 1 tablet (75 mg total) by mouth daily.  . divalproex (DEPAKOTE SPRINKLE) 125 MG capsule Take 125 mg by mouth 2 (two) times daily.  . fluticasone (FLONASE) 50 MCG/ACT nasal spray Place 1 spray into both nostrils daily.  Marland Kitchen lisinopril (ZESTRIL) 10 MG tablet Take 1 tablet (10 mg total) by mouth daily.  Marland Kitchen LORazepam (ATIVAN) 0.5 MG tablet Take 0.5 mg by mouth every 6 (six) hours as needed for anxiety.  . metoprolol tartrate (LOPRESSOR) 50 MG tablet Take 1 tablet (50 mg total) by mouth 2 (two) times daily.  . Vitamin D, Cholecalciferol, 50 MCG (2000 UT) CAPS Take 2,000 Units by mouth daily.     Allergies: Allergies as of 09/29/2018 - Review Complete 09/29/2018  Allergen Reaction Noted  . Dust mite extract Other (See Comments) 10/21/2015  . Pollen extract Other (See Comments) 10/21/2015  . Rye grass flower pollen extract [gramineae pollens] Other (See Comments) 10/21/2015   Past Medical History:  Diagnosis Date  . Cancer [facility])    Prostate  .  Chicken pox   . Dementia ([place])   . Depression   . Heart disease   . Hyperlipemia   . Hypertension   . Measles   . Mumps   . 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 Stroke [facility])    Surgical History: Past Surgical History:  Procedure Laterality Date  . IR ANGIO EXTERNAL CAROTID SEL EXT CAROTID UNI R MOD SED  07/20/2016  . IR ANGIO INTRA EXTRACRAN SEL COM CAROTID INNOMINATE UNI L MOD SED  08/31/2018  . IR ANGIO INTRA EXTRACRAN SEL INTERNAL CAROTID BILAT MOD SED  07/20/2016  . IR ANGIO VERTEBRAL SEL SUBCLAVIAN INNOMINATE UNI L MOD SED  07/20/2016  . IR ANGIOGRAM EXTREMITY RIGHT  07/20/2016  . IR CT HEAD LTD   08/31/2018  . IR PERCUTANEOUS ART THROMBECTOMY/INFUSION INTRACRANIAL INC DIAG ANGIO  08/31/2018  . RADIOLOGY WITH ANESTHESIA N/A 08/31/2018   Procedure: IR WITH ANESTHESIA;  Surgeon: Radiologist, Medication, MD;  Location: [place];  Service: Radiology;  Laterality: N/A;    Family History:  Family History  Problem Relation Age of Onset  . Hypertension Father   . Hyperlipidemia Father   . Congestive Heart Failure Father 71       Deceased-1995  . Diabetes Mother   . Kidney failure Mother 105       Deceased-2005  . Hypertension Mother   . Congestive Heart Failure Maternal Grandmother   . Heart failure Paternal Grandmother   . Heart attack Paternal Grandmother     Social History:  Relationships  Social connections  . Talks on phone: Not on file  . Gets together: Not on file  . Attends religious service: Not on file  . Active member of club or organization: Not on file  . Attends meetings of clubs or organizations: Not on file  . Relationship status: Not on file   Review of Systems: Unable to access ROS due to patient's AMS.  Physical Exam: Blood pressure (!) 179/75, pulse 89, temperature 97.9 F (36.6 C), temperature source Rectal, resp. rate 20, height 5\' 8"  (1.727 m), weight 97.5 kg, SpO2 97 %. Physical Exam Constitutional:      General: He is not in acute distress.    Appearance: He is ill-appearing. He is not toxic-appearing.     Comments: Patient is not arousable. No reaction to noxious stimuli (sternal rub, nailbed pressure)  HENT:     Head: Normocephalic and atraumatic.  Eyes:     General: No scleral icterus.       Right eye: No discharge.        Left eye: No discharge.     Pupils: Pupils are equal, round, and reactive to light.     Comments: Unable to access EOM due to patient's AMS  Neck:     Comments: Cervical collar in place Cardiovascular:     Rate and Rhythm: Normal rate and regular rhythm.     Pulses: Normal pulses.     Heart sounds: Normal heart sounds. No  murmur. No friction rub. No gallop.      Comments: No pitting edema in bilateral lower extremities. Peripheral pulses intact.  Pulmonary:     Effort: Pulmonary effort is normal. No respiratory distress.     Breath sounds: No wheezing or rales.     Comments: Transmitted upper airway sounds Abdominal:     General: Bowel sounds are normal. There is no distension.     Palpations: Abdomen is soft.     Tenderness: There  is no abdominal tenderness. There is no guarding.  Musculoskeletal:        General: No swelling or deformity.     Right lower leg: No edema.     Left lower leg: No edema.     Comments: -Bilateral UE diffusely hyperreflexic  -LLE hyperreflexia  diffusely . -RLE 2+ reflexes     Skin:    Coloration: Skin is not jaundiced.  Neurological:     Comments: Mental status: unable to access Cranial Nerves: unable to access  Sensory: unable to access Strength: unable to access  - +Hoffman sign on L, unable to access R due to equipment placement -L babinksi positive, R babinksi negative    EKG: personally reviewed and my interpretation is multiple PVCs. No ST changes.   CXR: personally reviewed my interpretation is unremarkable. Official read below. FINDINGS: The cardiac silhouette, mediastinal and hilar contours are within normal limits and stable. Stable mild tortuosity and calcification of the thoracic aorta. The lungs are clear. No pleural effusion. No worrisome pulmonary lesions. The bony thorax is intact.  IMPRESSION: No acute cardiopulmonary findings.  CT Head WO Contrast: 1. Stable non-contrast CT appearance of the brain. Chronic right parietal lobe encephalomalacia. 2.  No acute cervical spine fracture.  CT Cervical Spine WO Contrast: 1. Stable non-contrast CT appearance of the brain. Chronic right parietal lobe encephalomalacia. 2.  No acute cervical spine fracture.  MR Brain WO Contrast: IMPRESSION: Expected evolutionary changes of the late subacute  infarction in the right MCA territory. Developing volume loss and gliosis. No evidence of any additional extension or new insult. 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  EEG: IMPRESSION: The patient appears asleep throughout the majority of the recording.  Amplitude asymmetry is noted with decreased amplitude over the right consistent with the patient's imaging. No epileptiform activity is noted.    Assessment & Plan by Problem: Active Problems:   Seizure [facility])  Mr. Evetts is a 73 y.o male with a PMHx significant for prostate cancer, dementia, heart disease, hyperlipidemia, HTN, measles, mumps, schizophrenia, multiple TIA, and stroke who presented today with tonic clonic seizures, suspected to be secondary to a R sided subacute stroke confirmed by CT head and MRI.  # R parietal subacute stroke: The findings on CT of the head today are consistent with the previous L MCA stroke the patient suffered from last month. Upon readmission today, neurology recommended an MRI brain to further characterize the findings on the CT, which did not find any evidence of additional injury or insult. What could be gathered on physical exam today was consistent with the findings on previous hospitalization. Will do thorough neurologic exam once patient is alert and able to participate. - Atorvastatin 81 mg daily - Plavix 74mg  daily - Lovenox 40mg  injection daily - Aspirin 325 mg daily   #Seizures: The describptions of full body convulsions that were improved by Versed is consistent with generalized tonic-clonic seizure. The first siezure at the nursing home was resolved with 5mg  of Versed once EMS arrived. The patient seized again in the ED where  5mg  total of Ativan and 2000 mg of IV Keppra were administered. Subsequent EEG monitoring did not reveal seizure like activity. Will plan to appreciate neurology recommendations moving forward - 2000 mg load of Keppra in the ED  - Ativan  PRN - consider Depakote 2000mg  IV if seizures break through   #HTN - Metoprolol 50mg  PO BID - Lisinopril 10mg  PO daily -  #FENGI - Aspirtation  risk, currently NPO  Dispo: Admit patient to Inpatient with expected length of stay greater than 2 midnights.  Signed: [name], MD 09/29/2018, 3:27 PM  Pager: [phone]

## 2018-09-29 NOTE — Consult Note (Addendum)
NEURO HOSPITALIST CONSULT NOTE   Requestig physician: Dr. Zenia Resides  Reason for Consult: Recurrence of seizures with status epilepticus  History obtained from:   Chart     HPI:                                                                                                                                          Ronald [name] is an 72 y.o. male with history of prior strokes and dementia and schizophrenia who presents from SNF with recurrence of seizures. Nursing home staff found patient on the floor where he had been for an unknown time and called EMS. On arrival EMS noted that patient was having a GTC seizure. He was administered 5 mg of IM Versed which stopped the seizure activity. VS were stable per EMS and CBG was 93. On arrival to the ED he started seizing again with left sided gaze and left sided tonic-clonic activity. /2 mg Ativan was administered followed by 1000 mg IV Keppra load. CT head showed right parietal lobe cortically based subacute-appearing hypodensity, most likely a subacute stroke. Neurology was called and on arrival patient was still with left sided seizure activity. Additional 1000 mg IV Keppra was started and additional 2 mg IV Ativan administered. Valproic acid load ordered (2000 mg IV) for use if Keppra load is ineffective. STAT EEG ordered.  His first seizure was about one year ago, per sister (eyes rolled back and was repetitively blinking after slumping over in chair, but with no GTC activity - was sleepy afterwards). Second seizure occurred in January. Had another seizure last month at the time of his most recent stroke.   Past Medical History:  Diagnosis Date  . Cancer [facility])    Prostate  . Chicken pox   . Dementia ([place])   . Depression   . Heart disease   . Hyperlipemia   . Hypertension   . Measles   . Mumps   . 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 Stroke [facility])     Past Surgical History:  Procedure Laterality Date   . IR ANGIO EXTERNAL CAROTID SEL EXT CAROTID UNI R MOD SED  07/20/2016  . IR ANGIO INTRA EXTRACRAN SEL COM CAROTID INNOMINATE UNI L MOD SED  08/31/2018  . IR ANGIO INTRA EXTRACRAN SEL INTERNAL CAROTID BILAT MOD SED  07/20/2016  . IR ANGIO VERTEBRAL SEL SUBCLAVIAN INNOMINATE UNI L MOD SED  07/20/2016  . IR ANGIOGRAM EXTREMITY RIGHT  07/20/2016  . IR CT HEAD LTD  08/31/2018  . IR PERCUTANEOUS ART THROMBECTOMY/INFUSION INTRACRANIAL INC DIAG ANGIO  08/31/2018  . RADIOLOGY WITH ANESTHESIA N/A 08/31/2018   Procedure: IR WITH ANESTHESIA;  Surgeon: Radiologist, Medication, MD;  Location: [place];  Service: Radiology;  Laterality: N/A;  Family History  Problem Relation Age of Onset  . Hypertension Father   . Hyperlipidemia Father   . Congestive Heart Failure Father 27       Deceased-1995  . Diabetes Mother   . Kidney failure Mother 91       Deceased-2005  . Hypertension Mother   . Congestive Heart Failure Maternal Grandmother   . Heart failure Paternal Grandmother   . Heart attack Paternal Grandmother               Social History:  reports that he has never smoked. He has never used smokeless tobacco. He reports that he does not drink alcohol or use drugs.  Allergies  Allergen Reactions  . Dust Mite Extract Other (See Comments)    Allergy symptoms   . Pollen Extract Other (See Comments)    Allergy symptoms   . Rye Grass Flower Pollen Extract [Gramineae Pollens] Other (See Comments)    Allergy Symptoms     HOME MEDICATIONS:                                                                                                                        ROS:                                                                                                                                       Unable to obtain due to ongoing seizure activity.    Blood pressure (!) 153/93, pulse 76, temperature 97.6 F (36.4 C), temperature source Axillary, resp. rate (!) 21, height 5\' 8"  (1.727 m), weight 97.5 kg, SpO2  96 %.   General Examination:                                                                                                       Physical Exam  HEENT-  Normocephalic. C-collar in place.   Lungs- Nasal trumpet in place Extremities- Mild distal lower extremity edema bilaterally   Neurological Examination Mental  Status: Actively seizing. Eyes closed. No eye opening or purposeful movements to stimulation. Not following commands.  Cranial Nerves: II: PERRL. No blink to threat.  III,IV, VI: Eyes near the midline with mild exotropia noted. No nystagmus.  V,VII: No grimace to brow ridge pressure. Face symmetric.  VIII: hearing cannot be tested.  IX,X: Unable to assess due to not following commands and c-collar limits mouth opening.  XI: Unable to assess XII: Unable to assess Motor/Sensory: Actively seizing with jerking of LUE semi-rhythmically that worsens with noxious stimulation, varying from 1 to 5 Hz. Adductor posturing of LUE with noxious stimulation.  Minimal movement of RUE to noxious. No jerking or twitching of RUE noted.  Withdraws RLE with 2/5 strength to plantar stimulation. Weak withdrawal of LLE to plantar stimulation is significantly less brisk than on the right.  Deep Tendon Reflexes:  Hypereflexic LUE with clonus.  3+ RUE reflexes.  3+ patellae bilaterally.  Plantars: Right: downgoing   Left: downgoing Cerebellar/Gait: Unable to assess   Lab Results: Basic Metabolic Panel: Recent Labs  Lab 09/29/18 0737  NA 140  K 3.6  CL 107  CO2 22  GLUCOSE 89  BUN 8  CREATININE 0.79  CALCIUM 9.1    CBC: Recent Labs  Lab 09/29/18 0737  WBC 6.7  NEUTROABS 5.8  HGB 12.2*  HCT 39.4  MCV 92.3  PLT 208    Cardiac Enzymes: No results for input(s): CKTOTAL, CKMB, CKMBINDEX, TROPONINI in the last 168 hours.  Lipid Panel: No results for input(s): CHOL, TRIG, HDL, CHOLHDL, VLDL, LDLCALC in the last 168 hours.  Imaging: No results found.  Assessment: 73 year old  male with recurrence of seizures manifesting as status epilepticus. 1. Received 4 mg Versed IM by EMS with initial cessation of activity.  2. Seizures recurred in the ED. Keppra 1000 mg IV loaded without cessation. 2 mg IV Ativan was administered. On arrival by Neurology, seizure activity still present. Additional 2 mg IV Ativan given. Additional 1000 mg IV Keppra is infusing now.  3. STAT EEG was ordered 4. 2000 mg IV valproic acid load ordered.   Recommendations: 1. EEG technician has been called. Will review EEG at the bedside.  2. Continue Keppra at 1000 mg IV BID.  3. Will assess for possible scheduled Depakote based on response to Depakote bolus and second bolus of Keppra.  45 minutes spent in the emergent neurological evaluation and management of this critically ill patient.   Addendum: EEG reviewed at the bedside. No electrographic seizures seen. Asymmetry with decreased amplitude on the right sided leads. Recommendations: Hold off on Depakote load for now. Will need to be admitted to the floor with frequent neuro checks. Continue Keppra at 1000 mg IV BID. Obtain MRI brain during this admission.   15 additional minutes of ICU time.    Electronically signed: Dr. Kerney Elbe 09/29/2018, 8:29 AM

## 2018-09-29 NOTE — Progress Notes (Signed)
STAT EEG completed; results pending. Dr Cheral Marker notified.

## 2018-09-30 ENCOUNTER — Inpatient Hospital Stay (HOSPITAL_COMMUNITY): Payer: Medicare HMO

## 2018-09-30 DIAGNOSIS — R569 Unspecified convulsions: Secondary | ICD-10-CM

## 2018-09-30 LAB — COMPREHENSIVE METABOLIC PANEL
ALT: 18 U/L (ref 0–44)
AST: 29 U/L (ref 15–41)
Albumin: 3.1 g/dL — ABNORMAL LOW (ref 3.5–5.0)
Alkaline Phosphatase: 111 U/L (ref 38–126)
Anion gap: 12 (ref 5–15)
BUN: 8 mg/dL (ref 8–23)
CO2: 23 mmol/L (ref 22–32)
Calcium: 8.8 mg/dL — ABNORMAL LOW (ref 8.9–10.3)
Chloride: 105 mmol/L (ref 98–111)
Creatinine, Ser: 0.78 mg/dL (ref 0.61–1.24)
GFR calc Af Amer: >60 mL/min (ref >60)
GFR calc non Af Amer: >60 mL/min (ref >60)
Glucose, Bld: 102 mg/dL — ABNORMAL HIGH (ref 70–99)
Potassium: 3.4 mmol/L — ABNORMAL LOW (ref 3.5–5.1)
Sodium: 140 mmol/L (ref 135–145)
Total Bilirubin: 0.8 mg/dL (ref 0.3–1.2)
Total Protein: 6.3 g/dL — ABNORMAL LOW (ref 6.5–8.1)

## 2018-09-30 LAB — CBC
HCT: 37.8 % — ABNORMAL LOW (ref 39.0–52.0)
Hemoglobin: 11.9 g/dL — ABNORMAL LOW (ref 13.0–17.0)
MCH: 28.7 pg (ref 26.0–34.0)
MCHC: 31.5 g/dL (ref 30.0–36.0)
MCV: 91.3 fL (ref 80.0–100.0)
Platelets: 202 10*3/uL (ref 150–400)
RBC: 4.14 MIL/uL — ABNORMAL LOW (ref 4.22–5.81)
RDW: 14.1 % (ref 11.5–15.5)
WBC: 8.3 10*3/uL (ref 4.0–10.5)
nRBC: 0 % (ref 0.0–0.2)

## 2018-09-30 MED ORDER — LEVETIRACETAM IN NACL 1000 MG/100ML IV SOLN
1000.0000 mg | Freq: Two times a day (BID) | INTRAVENOUS | Status: DC
  Administered 2018-09-30 – 2018-10-01 (×3): 1000 mg via INTRAVENOUS
  Filled 2018-09-30 (×3): qty 100

## 2018-09-30 MED ORDER — SODIUM CHLORIDE 0.9 % IV SOLN
200.0000 mg | Freq: Two times a day (BID) | INTRAVENOUS | Status: DC
  Administered 2018-09-30 – 2018-10-02 (×5): 200 mg via INTRAVENOUS
  Filled 2018-09-30 (×7): qty 20

## 2018-09-30 NOTE — Progress Notes (Deleted)
LTM reviewed from start time of 3:30 PM to 5:00 PM. No further electrographic seizures seen. First dose of Vimpat was administered at 4:45 PM.   The patient is currently on the telephone conversing with his wife, with no clinical seizure activity seen. He does continue to appear confused.   Electronically signed: Dr. Kerney Elbe

## 2018-09-30 NOTE — Progress Notes (Signed)
Subjective: Overnight Ronald [name] was loaded with fosphenytoin 20mg /kg and Dilantin 100mg  for seizure activity. No other events. Today, Ronald [name] was resting comfortably when the team entered the room. When he spoke, he was mostly unintelligible due to significant dysarthria. However, is oriented to place ([place]), and believes he is in the hospital due stroke (unclear if he knows he left and came back), and oriented to year. Of note, his condom catheter fell off his penis. Nurse made aware.   Objective:  Vital signs in last 24 hours: Vitals:   09/29/18 1456 09/29/18 2134 09/30/18 0034 09/30/18 0413  BP: (!) 179/75 (!) 164/139 (!) 125/56 (!) 155/57  Pulse: 89 (!) 103 64 71  Resp: 20 20 16 16   Temp:  99 F (37.2 C) 97.8 F (36.6 C) 97.6 F (36.4 C)  TempSrc:      SpO2: 97% 100% 97%   Weight:      Height:       Physical Exam Constitutional:      General: He is not in acute distress.    Appearance: He is normal weight. He is not diaphoretic.  HENT:     Head: Normocephalic and atraumatic.  Cardiovascular:     Rate and Rhythm: Regular rhythm. Tachycardia present.     Pulses: Normal pulses.     Heart sounds: Normal heart sounds. No murmur. No friction rub. No gallop.   Pulmonary:     Effort: Pulmonary effort is normal. No respiratory distress.     Breath sounds: Normal breath sounds. No wheezing or rales.  Abdominal:     General: Abdomen is flat.     Palpations: Abdomen is soft.  Neurological:     Mental Status: He is alert.    Assessment/Plan:  Active Problems:   Seizure ([place])   Seizure-like activity ([place])  In summary, Ronald [name] is a 73 y.o male with a PMHx significant for prostate cancer, dementia, heart disease, hyperlipidemia, HTN, measles, mumps, schizophrenia, multiple TIA, and stroke who presented today with tonic clonic seizures, suspected to be secondary to a R sided subacute stroke confirmed by CT head and MRI.  #R parietal subacute stroke: The  findings on CT of the head today are consistent with the previous L MCA stroke the patient suffered from last month. Upon readmission today, neurology recommended an MRI brain to further characterize the findings on the CT, which did not find any evidence of additional injury or insult. What could be gathered on physical exam today was consistent with the findings on previous hospitalization. Will do thorough neurologic exam once patient is alert and able to participate. - Atorvastatin 81 mg daily - Plavix 74mg  daily - Lovenox 40mg  injection daily - Aspirin 325 mg daily   #Seizures: The descriptions of full body convulsions that were improved by Versed is consistent with generalized tonic-clonic seizure. The first siezure at the nursing home was resolved with 5mg  of Versed once EMS arrived. The patient seized again in the ED where 5mg  total of Ativan and 2000 mg of IV Keppra were administered. Subsequent EEG monitoring did not reveal seizure like activity. Will continue to appreciate neurology recs.  - 2000 mg load of Keppra in the ED  - Ativan PRN - Given fosphenytoin 20mg /kg and Dilantin 100mg  via Neurology overnight - consider Depakote 2000mg  IV if seizures break through    #HTN - Metoprolol 50mg  PO BID - Lisinopril 10mg  PO daily -  #FENGI - Aspirtation risk, currently NPO  Dispo: Anticipated discharge in  approximately 3 day(s).   [name], MD 09/30/2018, 6:54 AM Pager: [phone]

## 2018-09-30 NOTE — Progress Notes (Signed)
EEG Completed; Results Pending  

## 2018-09-30 NOTE — Progress Notes (Signed)
OVERNIGHT NOTE  Asked by Dr. Cheral Marker to check on the patient for any left sided twitching.  Patient in bed, appears severely dysarthric and not following commands. Left sided neglect. Moving right side spontaneously. Weaker on left but no twitching seen.  C/w recs as in Dr. Yvetta Coder note. C/w current AEDs Neurology will continue to follow. EEG in AM   -- Amie Portland, MD [facility] Pager: [phone] If 7pm to 7am, please call on call as listed on AMION.

## 2018-09-30 NOTE — [place] FL2 (Addendum)
[place] MEDICAID FL2 LEVEL OF CARE SCREENING TOOL     IDENTIFICATION  Patient Name: Ronald [name] Birthdate: [birth year] Sex: male Admission Date (Current Location): 09/29/2018  [facility] and [place] Number:  Herbalist and Address:  [facility] [place] [address], [place], [place] [address]      Provider Number: [id number]  Attending Physician Name and Address:  Sid Falcon, MD  Relative Name and Phone Number:  Florian Buff [phone]    Current Level of Care: Hospital Recommended Level of Care: [place] Prior Approval Number:    Date Approved/Denied:   PASRR Number: [id number] H No end date  Discharge Plan: SNF    Current Diagnoses: Patient Active Problem List   Diagnosis Date Noted  . Seizure ([place]) 09/29/2018  . Seizure-like activity ([place])   . Paranoid schizophrenia ([place])   . Pressure injury of skin 09/01/2018  . Stroke [facility]) - R MCA s/p tPA and angioplasty R MCA. 08/31/2018  . Internal carotid artery stenosis, right 08/31/2018  . Hypotension 05/26/2018  . Right carotid artery occlusion 05/26/2018  . History of completed stroke 05/26/2018  . Obesity 05/26/2018  . TIA (transient ischemic attack) 05/23/2018  . Cerebrovascular accident (CVA) due to occlusion of right carotid artery ([place])   . Cerebral thrombosis with cerebral infarction 07/20/2016  . Non-traumatic rhabdomyolysis   . Bronchitis 07/15/2016  . Dementia ([place]) 07/15/2016  . CAP (community acquired pneumonia) 07/15/2016  . Acute left hemiparesis ([place]) 07/15/2016  . Abnormal urinalysis 07/15/2016  . Effusion of left knee 07/15/2016  . Elevated AST (SGOT) 07/15/2016  . Elevated troponin 07/15/2016  . Lobar pneumonia ([place])   . Prostate cancer ([place]) 10/21/2015  . Other seasonal allergic rhinitis 10/21/2015  . Essential hypertension 01/16/2014  . Bipolar 1 disorder ([place]) 08/02/2013  . GERD (gastroesophageal reflux disease) 07/04/2013  . Elevated  PSA, less than 10 ng/ml 07/04/2013  . BPH with elevated PSA 02/28/2013  . Encounter for Medicare annual wellness exam 02/28/2013  . Hyperlipidemia 02/28/2013    Orientation RESPIRATION BLADDER Height & Weight     Self  Normal Continent Weight: 214 lb 15.2 oz (97.5 kg) Height:  5\' 8"  (172.7 cm)  BEHAVIORAL SYMPTOMS/MOOD NEUROLOGICAL BOWEL NUTRITION STATUS      Continent Diet(see discharge summary)  AMBULATORY STATUS COMMUNICATION OF NEEDS Skin   Extensive Assist Verbally Normal                       Personal Care Assistance Level of Assistance  Bathing, Feeding, Dressing, Total care Bathing Assistance: Limited assistance Feeding assistance: Independent Dressing Assistance: Limited assistance Total Care Assistance: Maximum assistance   Functional Limitations Info  Sight, Hearing, Speech Sight Info: Adequate Hearing Info: Adequate Speech Info: Adequate    SPECIAL CARE FACTORS FREQUENCY  PT (By licensed PT), OT (By licensed OT)     PT Frequency: min 5x weekly OT Frequency: min 5x weekly            Contractures Contractures Info: Not present    Additional Factors Info  Code Status, Allergies Code Status Info: Full Allergies Info: Dust Mite Extract, Pollen Extract, Rye Grass Flower Pollen Extract (gramineae pollens)           Current Medications (09/30/2018):  This is the current hospital active medication list Current Facility-Administered Medications  Medication Dose Route Frequency Provider Last Rate Last Dose  . aspirin suppository 300 mg  300 mg Rectal Daily Kathi Ludwig, MD  Or  . aspirin tablet 325 mg  325 mg Oral Daily Kathi Ludwig, MD   325 mg at 09/29/18 2150  . atorvastatin (LIPITOR) tablet 80 mg  80 mg Oral Daily Neva Seat, MD      . clopidogrel (PLAVIX) tablet 75 mg  75 mg Oral Daily Harbrecht, Purcell Nails, MD      . enoxaparin (LOVENOX) injection 40 mg  40 mg Subcutaneous Q24H Neva Seat, MD   40 mg at 09/29/18 1709   . feeding supplement (ENSURE ENLIVE) (ENSURE ENLIVE) liquid 237 mL  237 mL Oral BID BM Neva Seat, MD      . fluticasone Asencion Islam) 50 MCG/ACT nasal spray 1 spray  1 spray Each Nare Daily Neva Seat, MD   1 spray at 09/29/18 2151  . lisinopril (ZESTRIL) tablet 10 mg  10 mg Oral Daily Neva Seat, MD      . metoprolol tartrate (LOPRESSOR) tablet 50 mg  50 mg Oral BID Neva Seat, MD   50 mg at 09/29/18 2152  . phenytoin (DILANTIN) injection 100 mg  100 mg Intravenous Q8H Kerney Elbe, MD   100 mg at 09/30/18 0527  . sodium chloride flush (NS) 0.9 % injection 3 mL  3 mL Intravenous Q12H Neva Seat, MD   3 mL at 09/29/18 1710  . valproate (DEPACON) 2,000 mg in dextrose 5 % 50 mL IVPB  2,000 mg Intravenous Once Neva Seat, MD   Stopped at 09/29/18 [phone]     Discharge Medications: Please see discharge summary for a list of discharge medications.  Relevant Imaging Results:  Relevant Lab Results:   Additional Information SSN: [id number]  Alberteen Sam, LCSW

## 2018-09-30 NOTE — TOC Initial Note (Signed)
Transition of Care [facility]) - Initial/Assessment Note    Patient Details  Name: Ronald [name] MRN: [id number] Date of Birth: 09-22-1945  Transition of [facility] CM/SW Contact:    Alberteen Sam, [place] Phone Number: [phone] 09/30/2018, 10:51 AM  Clinical Narrative:                  CSW consulted with patient's sister Florian Buff who reports patient as briefly at [facility] before this hospital admission and she would like him to return to [facility] if possible for continued physical therapy rehab at discharge. Beulah reports [facility] is holding patient's things, however were unable to hold his room therefore when closer to discharge [facility] will have to assess bed availability. Beulah reports patient's plan was to go to [facility] after [place], and that he had an assessment to get into [facility] scheduled for this coming Monday 7/6. Beulah reports feeling discouraged due to multiple health obstacles that have prevented patient from going to [facility] as planned, but she is hopeful patient can resume physical therapy at [facility] with a goal of going to [facility] after. No further questions or concerns at this time, Florian Buff asks to be sent a SNF list if [facility] has no bed avail when patient is ready to discharge, so she can choose alternative placement if needed.   Expected Discharge Plan: Skilled Nursing Facility Barriers to Discharge: Continued Medical Work up   Patient Goals and CMS Choice   CMS Medicare.gov Compare Post Acute Care list provided to:: Patient Represenative (must comment)(Beulah (sister)) Choice offered to / list presented to : (sister [place])  Expected Discharge Plan and Services Expected Discharge Plan: [place] Choice: [place] arrangements for the past 2 months: [place], [place]                                      Prior Living  Arrangements/Services Living arrangements for the past 2 months: [place], [place] Lives with:: Self Patient language and need for interpreter reviewed:: Yes Do you feel safe going back to the place where you live?: Yes      Need for Family Participation in Patient Care: Yes (Comment) Care giver support system in place?: Yes (comment)   Criminal Activity/Legal Involvement Pertinent to Current Situation/Hospitalization: No - Comment as needed  Activities of Daily Living Home Assistive Devices/Equipment: Environmental consultant (specify type), Wheelchair ADL Screening (condition at time of admission) Patient's cognitive ability adequate to safely complete daily activities?: No Is the patient deaf or have difficulty hearing?: No Does the patient have difficulty seeing, even when wearing glasses/contacts?: No Does the patient have difficulty concentrating, remembering, or making decisions?: Yes Patient able to express need for assistance with ADLs?: No Does the patient have difficulty dressing or bathing?: Yes Independently performs ADLs?: No Communication: Independent Dressing (OT): Needs assistance Is this a change from baseline?: Pre-admission baseline Grooming: Needs assistance Is this a change from baseline?: Pre-admission baseline Feeding: Needs assistance Is this a change from baseline?: Pre-admission baseline Bathing: Dependent Is this a change from baseline?: Pre-admission baseline Toileting: Dependent Is this a change from baseline?: Pre-admission baseline In/Out Bed: Dependent Is this a change from baseline?: Pre-admission baseline Walks in Home: Dependent Is this a change from baseline?: Pre-admission baseline Does the patient have difficulty walking or climbing stairs?: Yes Weakness of Legs: Left  Weakness of Arms/Hands: Left  Permission Sought/Granted Permission sought to share information with : Case Manager, Customer service manager, Family  Supports Permission granted to share information with : Yes, Verbal Permission Granted  Share Information with NAME: Florian Buff  Permission granted to share info w AGENCY: SNFs  Permission granted to share info w Relationship: sister  Permission granted to share info w Contact Information: [phone]  Emotional Assessment Appearance:: Appears stated age Attitude/Demeanor/Rapport: Unable to Assess Affect (typically observed): Unable to Assess Orientation: : Oriented to Self Alcohol / Substance Use: Not Applicable Psych Involvement: No (comment)  Admission diagnosis:  Seizure-like activity [facility]) [R56.9] Patient Active Problem List   Diagnosis Date Noted  . Seizure ([place]) 09/29/2018  . Seizure-like activity ([place])   . Paranoid schizophrenia ([place])   . Pressure injury of skin 09/01/2018  . Stroke [facility]) - R MCA s/p tPA and angioplasty R MCA. 08/31/2018  . Internal carotid artery stenosis, right 08/31/2018  . Hypotension 05/26/2018  . Right carotid artery occlusion 05/26/2018  . History of completed stroke 05/26/2018  . Obesity 05/26/2018  . TIA (transient ischemic attack) 05/23/2018  . Cerebrovascular accident (CVA) due to occlusion of right carotid artery ([place])   . Cerebral thrombosis with cerebral infarction 07/20/2016  . Non-traumatic rhabdomyolysis   . Bronchitis 07/15/2016  . Dementia ([employer] [facility]) 07/15/2016  . CAP (community acquired pneumonia) 07/15/2016  . Acute left hemiparesis ([place]) 07/15/2016  . Abnormal urinalysis 07/15/2016  . Effusion of left knee 07/15/2016  . Elevated AST (SGOT) 07/15/2016  . Elevated troponin 07/15/2016  . Lobar pneumonia ([place])   . Prostate cancer ([place]) 10/21/2015  . Other seasonal allergic rhinitis 10/21/2015  . Essential hypertension 01/16/2014  . Bipolar 1 disorder ([place]) 08/02/2013  . GERD (gastroesophageal reflux disease) 07/04/2013  . Elevated PSA, less than 10 ng/ml 07/04/2013  . BPH with elevated PSA 02/28/2013  . Encounter for Medicare annual  wellness exam 02/28/2013  . Hyperlipidemia 02/28/2013   PCP:  Colonel Bald, MD Pharmacy:   [id number] [place], [place] - [address] [place] [address] Phone: [phone] Fax: [phone]     Social Determinants of Health (SDOH) Interventions    Readmission Risk Interventions No flowsheet data found.

## 2018-09-30 NOTE — Progress Notes (Addendum)
Subjective: Lying comfortably in bed. Appears to have left hemineglect.   Objective: Current vital signs: BP (!) 155/57 (BP Location: Right Arm)   Pulse 71   Temp 97.6 F (36.4 C)   Resp 16   Ht 5\' 8"  (1.727 m)   Wt 97.5 kg   SpO2 97%   BMI 32.68 kg/m  Vital signs in last 24 hours: Temp:  [97.6 F (36.4 C)-99 F (37.2 C)] 97.6 F (36.4 C) (07/04 0413) Pulse Rate:  [64-103] 71 (07/04 0413) Resp:  [14-21] 16 (07/04 0413) BP: (124-179)/(56-139) 155/57 (07/04 0413) SpO2:  [97 %-100 %] 97 % (07/04 0034)  Intake/Output from previous day: 07/03 0701 - 07/04 0700 In: 260 [I.V.:10; IV Piggyback:250] Out: 200 [Urine:200] Intake/Output this shift: No intake/output data recorded. Nutritional status:  Diet Order            Diet NPO time specified Except for: Sips with Meds  Diet effective now             HEENT: [place]/AT Lungs: Respirations unlabored Ext: Mild pretibial edema bilaterally   Neurological exam: Ment: The patient is awake and alert, with dysarthric speech that is almost unintelligible. Makes comments unrelated to exam about the fourth of July. Poor eye contact. Rambling speech seems tangential, but for the most part cannot be understood. A degree of left sided neglect is noted.  CN: Eyes conjugate. Will track to the left but with difficulty.   Motor: Continuous, low-amplitude left upper extremity jerking is noted. This is improved with lower amplitude and frequency since prior exam, but persists. Will not move LUE to command; weak 2/5 movement to noxious.  LUE with increased tone in the context of palpable jerks occurring at 1-2 Hz. Does not move LLE to command, but will weakly withdraw to noxious. Will move RUE and RLE briskly to command.  Sensory: Moves RUE and RLE briskly to tactile stimulation. Requires repetitive stimulation on the left to induce movement, with worse impairment involving the LUE.  Cerebellar: No gross ataxia.    Lab Results: Results for orders  placed or performed during the hospital encounter of 09/29/18 (from the past 48 hour(s))  CBG monitoring, ED     Status: None   Collection Time: 09/29/18  7:32 AM  Result Value Ref Range   Glucose-Capillary 77 70 - 99 mg/dL  Basic metabolic panel     Status: None   Collection Time: 09/29/18  7:37 AM  Result Value Ref Range   Sodium 140 135 - 145 mmol/L   Potassium 3.6 3.5 - 5.1 mmol/L   Chloride 107 98 - 111 mmol/L   CO2 22 22 - 32 mmol/L   Glucose, Bld 89 70 - 99 mg/dL   BUN 8 8 - 23 mg/dL   Creatinine, Ser 0.79 0.61 - 1.24 mg/dL   Calcium 9.1 8.9 - 10.3 mg/dL   GFR calc non Af Amer >60 >60 mL/min   GFR calc Af Amer >60 >60 mL/min   Anion gap 11 5 - 15    Comment: Performed at [facility], [place] [address], [place], [place] [address]  CBC WITH DIFFERENTIAL     Status: Abnormal   Collection Time: 09/29/18  7:37 AM  Result Value Ref Range   WBC 6.7 4.0 - 10.5 K/uL   RBC 4.27 4.22 - 5.81 MIL/uL   Hemoglobin 12.2 (L) 13.0 - 17.0 g/dL   HCT 39.4 39.0 - 52.0 %   MCV 92.3 80.0 - 100.0 fL  MCH 28.6 26.0 - 34.0 pg   MCHC 31.0 30.0 - 36.0 g/dL   RDW 13.8 11.5 - 15.5 %   Platelets 208 150 - 400 K/uL   nRBC 0.0 0.0 - 0.2 %   Neutrophils Relative % 87 %   Neutro Abs 5.8 1.7 - 7.7 K/uL   Lymphocytes Relative 8 %   Lymphs Abs 0.5 (L) 0.7 - 4.0 K/uL   Monocytes Relative 5 %   Monocytes Absolute 0.3 0.1 - 1.0 K/uL   Eosinophils Relative 0 %   Eosinophils Absolute 0.0 0.0 - 0.5 K/uL   Basophils Relative 0 %   Basophils Absolute 0.0 0.0 - 0.1 K/uL   Immature Granulocytes 0 %   Abs Immature Granulocytes 0.02 0.00 - 0.07 K/uL    Comment: Performed at [facility] [address] [place], [place] [address]  Lactic acid, plasma     Status: None   Collection Time: 09/29/18  7:45 AM  Result Value Ref Range   Lactic Acid, Venous 1.6 0.5 - 1.9 mmol/L    Comment: Performed at [facility] [address], , [place] [address]  CK     Status: None   Collection Time:  09/29/18  7:54 AM  Result Value Ref Range   Total CK 239 49 - 397 U/L    Comment: Performed at [facility], [place] [address], [place], [place] [address]  SARS Coronavirus 2 (CEPHEID - Performed in [facility] hospital lab), Hosp Order     Status: None   Collection Time: 09/29/18  8:08 AM   Specimen: Nasopharyngeal Swab  Result Value Ref Range   SARS Coronavirus 2 NEGATIVE NEGATIVE    Comment: (NOTE) If result is NEGATIVE SARS-CoV-2 target nucleic acids are NOT DETECTED. The SARS-CoV-2 RNA is generally detectable in upper and lower  respiratory specimens during the acute phase of infection. The lowest  concentration of SARS-CoV-2 viral copies this assay can detect is 250  copies / mL. A negative result does not preclude SARS-CoV-2 infection  and should not be used as the sole basis for treatment or other  patient management decisions.  A negative result may occur with  improper specimen collection / handling, submission of specimen other  than nasopharyngeal swab, presence of viral mutation(s) within the  areas targeted by this assay, and inadequate number of viral copies  (<250 copies / mL). A negative result must be combined with clinical  observations, patient history, and epidemiological information. If result is POSITIVE SARS-CoV-2 target nucleic acids are DETECTED. The SARS-CoV-2 RNA is generally detectable in upper and lower  respiratory specimens dur ing the acute phase of infection.  Positive  results are indicative of active infection with SARS-CoV-2.  Clinical  correlation with patient history and other diagnostic information is  necessary to determine patient infection status.  Positive results do  not rule out bacterial infection or co-infection with other viruses. If result is PRESUMPTIVE POSTIVE SARS-CoV-2 nucleic acids MAY BE PRESENT.   A presumptive positive result was obtained on the submitted specimen  and confirmed on repeat testing.  While 2019 novel  coronavirus  (SARS-CoV-2) nucleic acids may be present in the submitted sample  additional confirmatory testing may be necessary for epidemiological  and / or clinical management purposes  to differentiate between  SARS-CoV-2 and other Sarbecovirus currently known to infect humans.  If clinically indicated additional testing with an alternate test  methodology [phone]) is advised. The SARS-CoV-2 RNA is generally  detectable in upper and lower respiratory sp ecimens during the acute  phase of infection. The expected result is Negative. Fact Sheet for Patients:  StrictlyIdeas.no Fact Sheet for Healthcare Providers: BankingDealers.co.za This test is not yet approved or cleared by the [place] FDA and has been authorized for detection and/or diagnosis of SARS-CoV-2 by FDA under an Emergency Use Authorization (EUA).  This EUA will remain in effect (meaning this test can be used) for the duration of the COVID-19 declaration under Section 564(b)(1) of the Act, 21 U.S.C. section 360bbb-3(b)(1), unless the authorization is terminated or revoked sooner. Performed at [facility], [place] [address] [place], [place] [address]   Ethanol     Status: None   Collection Time: 09/29/18  8:19 AM  Result Value Ref Range   Alcohol, Ethyl (B) <10 <10 mg/dL    Comment: (NOTE) Lowest detectable limit for serum alcohol is 10 mg/dL. For medical purposes only. Performed at [facility], [place] [address] [place], [place] [address]   Rapid urine drug screen (hospital performed)     Status: Abnormal   Collection Time: 09/29/18  9:20 AM  Result Value Ref Range   Opiates NONE DETECTED NONE DETECTED   Cocaine NONE DETECTED NONE DETECTED   Benzodiazepines POSITIVE (A) NONE DETECTED   Amphetamines NONE DETECTED NONE DETECTED   Tetrahydrocannabinol NONE DETECTED NONE DETECTED   Barbiturates NONE DETECTED NONE DETECTED    Comment: (NOTE) DRUG SCREEN  FOR MEDICAL PURPOSES ONLY.  IF CONFIRMATION IS NEEDED FOR ANY PURPOSE, NOTIFY LAB WITHIN 5 DAYS. LOWEST DETECTABLE LIMITS FOR URINE DRUG SCREEN Drug Class                     Cutoff (ng/mL) Amphetamine and metabolites    1000 Barbiturate and metabolites    200 Benzodiazepine                 315 Tricyclics and metabolites     300 Opiates and metabolites        300 Cocaine and metabolites        300 THC                            50 Performed at [facility], [place] [address] [place], [place] [address]   Lactic acid, plasma     Status: None   Collection Time: 09/29/18  3:55 PM  Result Value Ref Range   Lactic Acid, Venous 1.2 0.5 - 1.9 mmol/L    Comment: Performed at [facility] [address], [place], [place] [address]  CBC     Status: Abnormal   Collection Time: 09/29/18  3:55 PM  Result Value Ref Range   WBC 6.5 4.0 - 10.5 K/uL   RBC 4.20 (L) 4.22 - 5.81 MIL/uL   Hemoglobin 12.0 (L) 13.0 - 17.0 g/dL   HCT 38.6 (L) 39.0 - 52.0 %   MCV 91.9 80.0 - 100.0 fL   MCH 28.6 26.0 - 34.0 pg   MCHC 31.1 30.0 - 36.0 g/dL   RDW 14.0 11.5 - 15.5 %   Platelets 207 150 - 400 K/uL   nRBC 0.0 0.0 - 0.2 %    Comment: Performed at [facility], [place] [address] [place],  [address]  Creatinine, serum     Status: None   Collection Time: 09/29/18  3:55 PM  Result Value Ref Range  Creatinine, Ser 0.80 0.61 - 1.24 mg/dL   GFR calc non Af Amer >60 >60 mL/min   GFR calc Af Amer >60 >60 mL/min    Comment: Performed at [facility] [address] [place], [place] [address]  Glucose, capillary     Status: None   Collection Time: 09/29/18  9:16 PM  Result Value Ref Range   Glucose-Capillary 80 70 - 99 mg/dL  Valproic acid level     Status: Abnormal   Collection Time: 09/29/18  9:29 PM  Result Value Ref Range   Valproic Acid Lvl <10 (L) 50.0 - 100.0 ug/mL    Comment: RESULTS CONFIRMED BY MANUAL DILUTION Performed at [facility] [address],  [place], [place] [address]   Comprehensive metabolic panel     Status: Abnormal   Collection Time: 09/30/18  5:40 AM  Result Value Ref Range   Sodium 140 135 - 145 mmol/L   Potassium 3.4 (L) 3.5 - 5.1 mmol/L   Chloride 105 98 - 111 mmol/L   CO2 23 22 - 32 mmol/L   Glucose, Bld 102 (H) 70 - 99 mg/dL   BUN 8 8 - 23 mg/dL   Creatinine, Ser 0.78 0.61 - 1.24 mg/dL   Calcium 8.8 (L) 8.9 - 10.3 mg/dL   Total Protein 6.3 (L) 6.5 - 8.1 g/dL   Albumin 3.1 (L) 3.5 - 5.0 g/dL   AST 29 15 - 41 U/L   ALT 18 0 - 44 U/L   Alkaline Phosphatase 111 38 - 126 U/L   Total Bilirubin 0.8 0.3 - 1.2 mg/dL   GFR calc non Af Amer >60 >60 mL/min   GFR calc Af Amer >60 >60 mL/min   Anion gap 12 5 - 15    Comment: Performed at [facility], [place] [address], [place], [place] [address]  CBC     Status: Abnormal   Collection Time: 09/30/18  5:40 AM  Result Value Ref Range   WBC 8.3 4.0 - 10.5 K/uL   RBC 4.14 (L) 4.22 - 5.81 MIL/uL   Hemoglobin 11.9 (L) 13.0 - 17.0 g/dL   HCT 37.8 (L) 39.0 - 52.0 %   MCV 91.3 80.0 - 100.0 fL   MCH 28.7 26.0 - 34.0 pg   MCHC 31.5 30.0 - 36.0 g/dL   RDW 14.1 11.5 - 15.5 %   Platelets 202 150 - 400 K/uL   nRBC 0.0 0.0 - 0.2 %    Comment: Performed at [facility], [place] [address], [place], [place] [address]    Recent Results (from the past 240 hour(s))  SARS Coronavirus 2 (CEPHEID - Performed in [facility] hospital lab), Hosp Order     Status: None   Collection Time: 09/29/18  8:08 AM   Specimen: Nasopharyngeal Swab  Result Value Ref Range Status   SARS Coronavirus 2 NEGATIVE NEGATIVE Final    Comment: (NOTE) If result is NEGATIVE SARS-CoV-2 target nucleic acids are NOT DETECTED. The SARS-CoV-2 RNA is generally detectable in upper and lower  respiratory specimens during the acute phase of infection. The lowest  concentration of SARS-CoV-2 viral copies this assay can detect is 250  copies / mL. A negative result does not preclude SARS-CoV-2 infection  and should  not be used as the sole basis for treatment or other  patient management decisions.  A negative result may occur with  improper specimen collection / handling, submission of specimen other  than nasopharyngeal swab, presence  of viral mutation(s) within the  areas targeted by this assay, and inadequate number of viral copies  (<250 copies / mL). A negative result must be combined with clinical  observations, patient history, and epidemiological information. If result is POSITIVE SARS-CoV-2 target nucleic acids are DETECTED. The SARS-CoV-2 RNA is generally detectable in upper and lower  respiratory specimens dur ing the acute phase of infection.  Positive  results are indicative of active infection with SARS-CoV-2.  Clinical  correlation with patient history and other diagnostic information is  necessary to determine patient infection status.  Positive results do  not rule out bacterial infection or co-infection with other viruses. If result is PRESUMPTIVE POSTIVE SARS-CoV-2 nucleic acids MAY BE PRESENT.   A presumptive positive result was obtained on the submitted specimen  and confirmed on repeat testing.  While 2019 novel coronavirus  (SARS-CoV-2) nucleic acids may be present in the submitted sample  additional confirmatory testing may be necessary for epidemiological  and / or clinical management purposes  to differentiate between  SARS-CoV-2 and other Sarbecovirus currently known to infect humans.  If clinically indicated additional testing with an alternate test  methodology [phone]) is advised. The SARS-CoV-2 RNA is generally  detectable in upper and lower respiratory sp ecimens during the acute  phase of infection. The expected result is Negative. Fact Sheet for Patients:  StrictlyIdeas.no Fact Sheet for Healthcare Providers: BankingDealers.co.za This test is not yet approved or cleared by the [place] FDA and has been  authorized for detection and/or diagnosis of SARS-CoV-2 by FDA under an Emergency Use Authorization (EUA).  This EUA will remain in effect (meaning this test can be used) for the duration of the COVID-19 declaration under Section 564(b)(1) of the Act, 21 U.S.C. section 360bbb-3(b)(1), unless the authorization is terminated or revoked sooner. Performed at [facility], [place] [address] [place], [place] [address]     Lipid Panel No results for input(s): CHOL, TRIG, HDL, CHOLHDL, VLDL, LDLCALC in the last 72 hours.  Studies/Results: Ct Head Wo Contrast  Result Date: 09/29/2018 CLINICAL DATA:  Seizure.  Recent stroke. EXAM: CT HEAD WITHOUT CONTRAST CT CERVICAL SPINE WITHOUT CONTRAST TECHNIQUE: Multidetector CT imaging of the head and cervical spine was performed following the standard protocol without intravenous contrast. Multiplanar CT image reconstructions of the cervical spine were also generated. COMPARISON:  MR brain dated September 01, 2018. CT head and neck dated August 31, 2018. FINDINGS: CT HEAD FINDINGS Brain: No cortically based acute infarct identified. Chronic right parietal lobe encephalomalacia again noted. No hemorrhage, hydrocephalus, extra-axial collection or mass lesion/mass effect. Vascular: Atherosclerotic vascular calcification of the carotid siphons. No hyperdense vessel. Skull: Negative for fracture or focal lesion. Poor dentition with multiple dental caries and periapical abscesses. Sinuses/Orbits: No acute finding. Other: None. CT CERVICAL SPINE FINDINGS Alignment: Normal. Skull base and vertebrae: No acute fracture. No primary bone lesion or focal pathologic process. Soft tissues and spinal canal: No prevertebral fluid or swelling. No visible canal hematoma. Disc levels: Scattered mild endplate spurring with relative preservation of the disc heights. Upper chest: Negative. Other: Nasopharyngeal airway terminating in the oropharynx. IMPRESSION: 1. Stable non-contrast CT appearance  of the brain. Chronic right parietal lobe encephalomalacia. 2.  No acute cervical spine fracture. Electronically Signed   By: Titus Dubin M.D.   On: 09/29/2018 08:52   Ct Cervical Spine Wo Contrast  Result Date: 09/29/2018 CLINICAL DATA:  Seizure.  Recent stroke. EXAM: CT HEAD WITHOUT CONTRAST CT CERVICAL SPINE WITHOUT CONTRAST TECHNIQUE:  Multidetector CT imaging of the head and cervical spine was performed following the standard protocol without intravenous contrast. Multiplanar CT image reconstructions of the cervical spine were also generated. COMPARISON:  MR brain dated September 01, 2018. CT head and neck dated August 31, 2018. FINDINGS: CT HEAD FINDINGS Brain: No cortically based acute infarct identified. Chronic right parietal lobe encephalomalacia again noted. No hemorrhage, hydrocephalus, extra-axial collection or mass lesion/mass effect. Vascular: Atherosclerotic vascular calcification of the carotid siphons. No hyperdense vessel. Skull: Negative for fracture or focal lesion. Poor dentition with multiple dental caries and periapical abscesses. Sinuses/Orbits: No acute finding. Other: None. CT CERVICAL SPINE FINDINGS Alignment: Normal. Skull base and vertebrae: No acute fracture. No primary bone lesion or focal pathologic process. Soft tissues and spinal canal: No prevertebral fluid or swelling. No visible canal hematoma. Disc levels: Scattered mild endplate spurring with relative preservation of the disc heights. Upper chest: Negative. Other: Nasopharyngeal airway terminating in the oropharynx. IMPRESSION: 1. Stable non-contrast CT appearance of the brain. Chronic right parietal lobe encephalomalacia. 2.  No acute cervical spine fracture. Electronically Signed   By: Titus Dubin M.D.   On: 09/29/2018 08:52   Mr Brain Wo Contrast  Result Date: 09/29/2018 CLINICAL DATA:  Schizophrenia and dementia.  Found on the ground. EXAM: MRI HEAD WITHOUT CONTRAST TECHNIQUE: Multiplanar, multiecho pulse sequences of  the brain and surrounding structures were obtained without intravenous contrast. COMPARISON:  Head CT same day.  MRI 09/01/2018 FINDINGS: Brain: Diffusion imaging does not show any acute or subacute insult today. No brainstem abnormality is seen. There are a few old small vessel cerebellar infarctions. 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 Less swelling and mass effect. 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 No mass lesion, hemorrhage, hydrocephalus or extra-axial collection. Minimal hemosiderin deposition in the region of prior infarction. Vascular: Major vessels at the base of the brain show flow. 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 Developing volume loss and gliosis. No evidence of any additional extension or new insult. 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 Electronically Signed   By: Nelson Chimes M.D.   On: 09/29/2018 14:22   Dg Chest Portable 1 View  Result Date: 09/29/2018 CLINICAL DATA:  Seizure this morning.  Chest congestion. EXAM: PORTABLE CHEST 1 VIEW COMPARISON:  09/03/2018 FINDINGS: The cardiac silhouette, mediastinal and hilar contours are within normal limits and stable. Stable mild tortuosity and calcification of the thoracic aorta. The lungs are clear. No pleural effusion. No worrisome pulmonary lesions. The bony thorax is intact. IMPRESSION: No acute cardiopulmonary findings. Electronically Signed   By: Marijo Sanes M.D.   On: 09/29/2018 09:22    Medications:  Scheduled: . aspirin  300 mg Rectal Daily   Or  . aspirin  325 mg Oral Daily  . atorvastatin  80 mg Oral Daily  . clopidogrel  75 mg Oral Daily  . enoxaparin (LOVENOX) injection  40 mg Subcutaneous Q24H  . feeding supplement (ENSURE ENLIVE)  237 mL Oral BID BM  .  fluticasone  1 spray Each Nare Daily  . lisinopril  10 mg Oral Daily  . metoprolol tartrate  50 mg Oral BID  . phenytoin (DILANTIN) IV  100 mg Intravenous Q8H  . sodium chloride flush  3 mL Intravenous Q12H   Continuous: . valproate sodium Stopped (09/29/18 0857)  EEG 7/3: The patient appears asleep throughout the majority of the recording.  Amplitude asymmetry is noted with decreased amplitude over the right consistent with the patient's imaging.  No epileptiform activity is noted.    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  1. Exam this morning reveals continued jerking of LUE, but with lower amplitude and frequency than yesterday 2. On Dilantin 100 mg IV TID. Improvement in amplitude and frequency of the focal motor seizure is felt most likely to be due to addition of Dilantin yesterday. Keppra was apparently stopped but no documentation in notes as to why.  3. 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 Developing volume loss and gliosis. No evidence of any additional extension or new insult. 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  Recommendations: 1. Repeat EEG this AM.  2. Restarting Keppra at 1000 mg IV BID (first dose now). Unclear why this was stopped.    3. Continue Dilantin at 100 mg IV TID 4. If continued seizure activity after restarting Keppra most likely will start a scheduled benzodiazepine versus reloading with valproic acid or loading with Vimpat.   Addendum:  EEG at the bedside shows subtle right sided electrographic seizure activity. Correlating finding of subtle twitching of wrist and digits on the left. Starting Vimpat 200 mg IV BID. Due to shortage of LTM machines, will obtain spot EEG in the AM and continue to follow clinically.    LOS: 1 day   @Electronically   signed: Dr. Kerney Elbe 09/30/2018  10:20 AM

## 2018-09-30 NOTE — Procedures (Addendum)
ELECTROENCEPHALOGRAM REPORT   Patient: Ronald [name]       Room #: 5H74B EEG No. ID: [id number] Age: 73 y.o.        Sex: male Referring Physician: Daryll Drown Report Date:  09/30/2018        Interpreting Physician: Alexis Goodell  History: [name] is an 73 y.o. male with seizure activity  Medications:  ASA, Zestril, Lipitor, Plavix, Vimpat, Keppra, Lopressor, Dilantin, [place]  Conditions of Recording:  This is a 21 channel routine scalp EEG performed with bipolar and monopolar montages arranged in accordance to the international 10/20 system of electrode placement. One channel was dedicated to EKG recording.  The patient is in the awake state.  Description:  The waking background activity over the left hemisphere consists of a low voltage, fairly well organized, 10-11 Hz alpha activity, seen from the parieto-occipital and posterior temporal regions.  Low voltage fast activity, poorly organized, is seen anteriorly and is at times superimposed on more posterior regions.  A mixture of theta and alpha rhythms are seen from the central and temporal regions.  Artifact often obscures the right hemisphere but when visualized the right hemispheric activity id slow and disorganized consisting of a polymorphic delta rhythm with some overriding beta activity noted..   No epileptiform activity is noted.   Hyperventilation and intermittent photic stimulation were not performed.  IMPRESSION: This is an abnormal electroencephalogram secondary to focal slowing over the right hemisphere consistent with a focal disturbance.     Alexis Goodell, MD Neurology [phone] 09/30/2018, 5:33 PM

## 2018-09-30 NOTE — Progress Notes (Signed)
  Date: 09/30/2018  Patient name: Ronald [name]  Medical record number: [id number]  Date of birth: 1945/04/23   I have seen and evaluated this patient and I have discussed the plan of care with the house staff. Please see their note for complete details. I concur with their findings with the following additions/corrections: Ronald [name] was seen this morning on team rounds.  He is interactive but due to his dysarthria, he could not understand the majority of what he was telling us.  Neurology's note reviewed.  They repeated his EEG which showed subtle electrographic seizure activity.  They also were able to detect subtle twitching of his wrist and digits on the left.  Therefore, they added Vimpat to his Keppra and Dilantin.  The social worker is working with his sister regarding facilities at discharge.  Bartholomew Crews, MD 09/30/2018, 4:42 PM

## 2018-10-01 ENCOUNTER — Inpatient Hospital Stay (HOSPITAL_COMMUNITY): Payer: Medicare HMO

## 2018-10-01 LAB — GLUCOSE, CAPILLARY: Glucose-Capillary: 127 mg/dL — ABNORMAL HIGH (ref 70–99)

## 2018-10-01 LAB — BASIC METABOLIC PANEL
Anion gap: 12 (ref 5–15)
BUN: 8 mg/dL (ref 8–23)
CO2: 23 mmol/L (ref 22–32)
Calcium: 8.7 mg/dL — ABNORMAL LOW (ref 8.9–10.3)
Chloride: 105 mmol/L (ref 98–111)
Creatinine, Ser: 0.75 mg/dL (ref 0.61–1.24)
GFR calc Af Amer: >60 mL/min (ref >60)
GFR calc non Af Amer: >60 mL/min (ref >60)
Glucose, Bld: 76 mg/dL (ref 70–99)
Potassium: 3.4 mmol/L — ABNORMAL LOW (ref 3.5–5.1)
Sodium: 140 mmol/L (ref 135–145)

## 2018-10-01 LAB — PHENYTOIN LEVEL, TOTAL: Phenytoin Lvl: 23.9 ug/mL — ABNORMAL HIGH (ref 10.0–20.0)

## 2018-10-01 LAB — MAGNESIUM: Magnesium: 1.9 mg/dL (ref 1.7–2.4)

## 2018-10-01 MED ORDER — LEVETIRACETAM IN NACL 1000 MG/100ML IV SOLN
1000.0000 mg | Freq: Once | INTRAVENOUS | Status: AC
  Administered 2018-10-01: 1000 mg via INTRAVENOUS
  Filled 2018-10-01: qty 100

## 2018-10-01 MED ORDER — SODIUM CHLORIDE 0.9 % IV SOLN
2000.0000 mg | Freq: Two times a day (BID) | INTRAVENOUS | Status: DC
  Administered 2018-10-01 – 2018-10-02 (×3): 2000 mg via INTRAVENOUS
  Filled 2018-10-01 (×5): qty 20

## 2018-10-01 MED ORDER — POTASSIUM CHLORIDE CRYS ER 20 MEQ PO TBCR
40.0000 meq | EXTENDED_RELEASE_TABLET | Freq: Once | ORAL | Status: AC
  Administered 2018-10-01: 40 meq via ORAL
  Filled 2018-10-01: qty 2

## 2018-10-01 NOTE — Progress Notes (Signed)
   Subjective: Patient seen on rounds this AM. He was resting comfortably in his bed. He is feeling okay, but asks for assistance with going to the bathroom. He indicated that his mitten on his right hand is too tight and this was loosened. He was alert and oriented x3 this AM.  Objective:  Vital signs in last 24 hours: Vitals:   09/30/18 2001 10/01/18 0014 10/01/18 0348 10/01/18 0613  BP: (!) 160/68 (!) 154/68 (!) 155/95 (!) 122/48  Pulse: 74 67 65 68  Resp:  14    Temp: 97.8 F (36.6 C) 97.9 F (36.6 C) 97.6 F (36.4 C) 98.4 F (36.9 C)  TempSrc:  Oral Oral Oral  SpO2: 96% 98% 100% 93%  Weight:      Height:       Physical Exam Constitutional:      General: He is not in acute distress.    Appearance: He is normal weight. He is not diaphoretic.  HENT:     Head: Normocephalic and atraumatic.  Cardiovascular:     Rate and Rhythm: Normal rate and regular rhythm.     Pulses: Normal pulses.     Heart sounds: Normal heart sounds. No murmur. No friction rub. No gallop.   Pulmonary:     Effort: Pulmonary effort is normal. No respiratory distress.     Breath sounds: Normal breath sounds. No wheezing or rales.  Abdominal:     General: Abdomen is flat.     Palpations: Abdomen is soft.  Neurological:     Mental Status: He is alert.    Assessment/Plan:  Active Problems:   Seizure ([place])   Seizure-like activity ([place])  In summary, Ronald [name] is a 73 y.o male with a PMHx significant for prostate cancer, dementia, heart disease, hyperlipidemia, HTN, measles, mumps, schizophrenia, multiple TIA, and stroke who presented today with tonic clonic seizures, suspected to be secondary to a R sided subacute stroke confirmed by CT head and MRI.  #Seizures: The descriptions of full body convulsions that were improved by Versed is consistent with generalized tonic-clonic seizure. The first siezure at the nursing home was resolved with 5mg  of Versed once EMS arrived. The patient seized again in  the ED where 5mg  total of Ativan and 2000 mg of IV Keppra were administered. Subsequent EEG monitoring did not reveal seizure like activity.  - Appreciate neurology recs.   - LTM EEG at the bedside - Dilantin 100mg  q8h - Keppra increased to 2000mg  q12h - Vimpat 200mg  q12h - Ativan PRN   #Hypokalemia: K 3.4 on 7/4. Mg WNL.  - Repleting PO with 10mEq BID - BMP  #R parietal subacute stroke: The findings on CT head consistent with previous L MCA stroke.Repeat MRI did not find any evidence of additional injury or insult.  - Atorvastatin 81 mg daily - Plavix 74mg  daily - Lovenox 40mg  injection daily - Aspirin 325 mg daily   #HTN - Metoprolol 50mg  PO BID - Lisinopril 10mg  PO daily  #FENGI - Aspirtation risk, currently NPO > Swallow eval > Now Dys 1  Dispo: Anticipated discharge in approximately 3 day(s).   Neva Seat, MD 10/01/2018, 6:59 AM

## 2018-10-01 NOTE — Progress Notes (Signed)
LTM started; educated nurse on event button; Dr Deborah Chalk Notified.

## 2018-10-01 NOTE — Progress Notes (Signed)
Spoke to Pt's sister. Updated her on plan of care and pt condition. Answered questions to satisfaction. She asked about restarting physical therapy and home meds.

## 2018-10-01 NOTE — Evaluation (Signed)
Clinical/Bedside Swallow Evaluation Patient Details  Name: Ronald [name] MRN: [id number] Date of Birth: April 07, 1945  Today's Date: 10/01/2018 Time: SLP Start Time (ACUTE ONLY): 8182 SLP Stop Time (ACUTE ONLY): 0948 SLP Time Calculation (min) (ACUTE ONLY): 19 min  Past Medical History:  Past Medical History:  Diagnosis Date  . Cancer [facility])    Prostate  . Chicken pox   . Dementia ([place])   . Depression   . Heart disease   . Hyperlipemia   . Hypertension   . Measles   . Mumps   . 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 Stroke [facility])    Past Surgical History:  Past Surgical History:  Procedure Laterality Date  . IR ANGIO EXTERNAL CAROTID SEL EXT CAROTID UNI R MOD SED  07/20/2016  . IR ANGIO INTRA EXTRACRAN SEL COM CAROTID INNOMINATE UNI L MOD SED  08/31/2018  . IR ANGIO INTRA EXTRACRAN SEL INTERNAL CAROTID BILAT MOD SED  07/20/2016  . IR ANGIO VERTEBRAL SEL SUBCLAVIAN INNOMINATE UNI L MOD SED  07/20/2016  . IR ANGIOGRAM EXTREMITY RIGHT  07/20/2016  . IR CT HEAD LTD  08/31/2018  . IR PERCUTANEOUS ART THROMBECTOMY/INFUSION INTRACRANIAL INC DIAG ANGIO  08/31/2018  . RADIOLOGY WITH ANESTHESIA N/A 08/31/2018   Procedure: IR WITH ANESTHESIA;  Surgeon: Radiologist, Medication, MD;  Location: [place];  Service: Radiology;  Laterality: N/A;   HPI:  [name] is a 73 year old man with PMH of treated prostate cancer, heart disease, HLD, HTN, schizophrenia, multiple TIA, stroke (6/4 most recent), dementia.  He was found to be seizing in his SNF today and was sent to the ED, when we saw him he was somnolent, likely due to a mix of post ictal state and medications.  GCS 6. Abnormal EEG 7/4.  MRI 7/3: "Expected evolutionary changes of the late subacute infarction in the right MCA territory. Developing volume loss and gliosis. No evidence of any additional extension or new insult. Older infarction in the right parietal region as seen previously."   Assessment / Plan / Recommendation Clinical  Impression  Pt presents with moderate oral dysphagia.  There was anterior spillage of thin liquid by cup.  This was eliminated with straw sips. There was prompt oral transit of puree.  With solid there was oral holding, delayed mastication, pocketing on L, and oral residue.  Pt became drowsy and appeared to fall asleep with bolus in oral cavity.  With cuing pt was able to clear solid.  Pt benefitted from liquid wash, but solid was not completely cleared and portions of bolus were suctioned from L side of oral cavity.  There were no clinical s/s of aspiration with any consistencies trialed.  Pt is almost completely unintelligible.  Dysarthria is likely impacting speech, but it seems that pt was able to communicate more effictively during admission in june.  Should communication deficits persist, please place orders for cognitive-linguistic evaluation.  Suspect that pt will be able to advance diet texture with increased level of arousal.  Recommend puree diet with thin liquid. SLP Visit Diagnosis: Dysphagia, oral phase (R13.11)    Aspiration Risk  Mild aspiration risk    Diet Recommendation Dysphagia 1 (Puree);Thin liquid   Liquid Administration via: Straw Medication Administration: Crushed with puree Supervision: Full supervision/cueing for compensatory strategies Compensations: Slow rate;Small sips/bites;Follow solids with liquid(Check L side of oral cavity for pocketing) Postural Changes: Seated upright at 90 degrees    Other  Recommendations Oral Care Recommendations: Oral care BID  Follow up Recommendations        Frequency and Duration min 1 x/week  1 week       Prognosis Prognosis for Safe Diet Advancement: Fair Barriers to Reach Goals: Severity of deficits;Cognitive deficits;Time post onset      Swallow Study   General Date of Onset: 09/30/18 HPI: [name] is a 73 year old man with PMH of treated prostate cancer, heart disease, HLD, HTN, schizophrenia, multiple TIA, stroke  (6/4 most recent), dementia.  He was found to be seizing in his SNF today and was sent to the ED, when we saw him he was somnolent, likely due to a mix of post ictal state and medications.  GCS 6. Abnormal EEG 7/4.  MRI 7/3: "Expected evolutionary changes of the late subacute infarction in the right MCA territory. Developing volume loss and gliosis. No evidence of any additional extension or new insult. Older infarction in the right parietal region as seen previously." Type of Study: Bedside Swallow Evaluation Previous Swallow Assessment: Pt seen during previous admission in June for dysphagia and cognitive linguistic deficits.  Pt on mechanical soft diet with thin liquids as of 09/12/18 Diet Prior to this Study: NPO History of Recent Intubation: No Behavior/Cognition: Alert Oral Cavity Assessment: Within Functional Limits Oral Cavity - Dentition: Missing dentition Self-Feeding Abilities: Total assist Patient Positioning: Upright in bed Baseline Vocal Quality: Hoarse Volitional Cough: Cognitively unable to elicit Volitional Swallow: Unable to elicit    Oral/Motor/Sensory Function Facial ROM: Reduced left Facial Symmetry: Abnormal symmetry left Facial Strength: Reduced left Lingual ROM: Reduced left Lingual Symmetry: Abnormal symmetry left Lingual Strength: Reduced   Ice Chips Ice chips: Not tested   Thin Liquid Thin Liquid: Impaired Presentation: Cup;Straw Oral Phase Impairments: Reduced labial seal Oral Phase Functional Implications: Left anterior spillage    Nectar Thick Nectar Thick Liquid: Not tested   Honey Thick Honey Thick Liquid: Not tested   Puree Puree: Within functional limits Presentation: Spoon   Solid     Solid: Impaired Oral Phase Impairments: Poor awareness of bolus;Impaired mastication Oral Phase Functional Implications: Left lateral sulci pocketing;Prolonged oral transit;Impaired mastication      Celedonio Savage, MA, [place] Office: [phone]; Pager [phone] 10/01/2018,10:55 AM

## 2018-10-01 NOTE — Progress Notes (Signed)
NEUROLOGY PROGRESS NOTE  Brief history: Ronald [name] is an 73 y.o. male with history of prior strokes and dementia and schizophrenia who presents from SNF with recurrence of seizures. Nursing home staff found patient on the floor where he had been for an unknown time and called EMS. On arrival EMS noted that patient was having a GTC seizure. Patient was given a total of 5 mg of IM Versed, 4 mg Ativan, followed by total 2000 mg IV Keppra load for seizure suppression in the ED. CT head showed right parietal lobe cortically based subacute-appearing hypodensity, most likely a subacute stroke. Patient was admitted for further neuro work up.  Subjective: Patient continues to be severely dysarthric with mixed aphasia, not moving LHB, but following commands on RHB. EEG yesterday abnormal due to focal slowing over R hemisphere consistent with focal disturbance. Patient currently on keppra, valproate, dilantin, and vimpat for seizure suppression. Still with continuous twitching of digits of LUE. No facial or leg twitching. Waxing and waning level of consciousness.   Exam: Vitals:   10/01/18 0348 10/01/18 0613  BP: (!) 155/95 (!) 122/48  Pulse: 65 68  Resp:    Temp: 97.6 F (36.4 C) 98.4 F (36.9 C)  SpO2: 100% 93%   Physical Exam  HEENT-  Normocephalic, no lesions, without obvious abnormality.  Normal external eye and conjunctiva.   Cardiovascular- S1-S2 audible, pulses palpable throughout   Lungs-no rhonchi or wheezing noted, no excessive working breathing.  Saturations within normal limits Abdomen- All 4 quadrants palpated and nontender Extremities- Warm, dry and intact Musculoskeletal-no joint tenderness, deformity or swelling Skin-warm and dry, no hyperpigmentation, vitiligo, or suspicious lesions  Neuro:  Mental Status: Alert, oriented to self, place, and year. Difficult to assess thought content and aphasia due to severe dysarthria. Follows commands- lift leg, squeeze hand, push, pull.   Cranial Nerves: II: UTA III,IV, VI: L gaze preference but able to cross. extra-ocular motions intact bilaterally pupils equal, round, reactive to light and accommodation V,VII: L facial droop VIII: hearing intact to voice IX,X: Palate rises midline XI: UTA XII: UTA Motor: Right : Upper extremity   5/5    Left:     Upper extremity   1/5  Lower extremity   5/5     Lower extremity   1/5 Sensory: L sided neglect Deep Tendon Reflexes: 3+ and symmetric biceps and patellae, hyperreflexic Plantars: Right: downgoing   Left: downgoing Cerebellar: UTA Gait: deferred   Medications:  Current Facility-Administered Medications:  .  aspirin suppository 300 mg, 300 mg, Rectal, Daily **OR** aspirin tablet 325 mg, 325 mg, Oral, Daily, Harbrecht, Lawrence, MD, 325 mg at 10/01/18 1008 .  atorvastatin (LIPITOR) tablet 80 mg, 80 mg, Oral, Daily, Neva Seat, MD, 80 mg at 10/01/18 1008 .  clopidogrel (PLAVIX) tablet 75 mg, 75 mg, Oral, Daily, Kathi Ludwig, MD, 75 mg at 10/01/18 1008 .  enoxaparin (LOVENOX) injection 40 mg, 40 mg, Subcutaneous, Q24H, Neva Seat, MD, 40 mg at 09/30/18 1635 .  feeding supplement (ENSURE ENLIVE) (ENSURE ENLIVE) liquid 237 mL, 237 mL, Oral, BID BM, Neva Seat, MD, 237 mL at 10/01/18 1009 .  fluticasone (FLONASE) 50 MCG/ACT nasal spray 1 spray, 1 spray, Each Nare, Daily, Neva Seat, MD, 1 spray at 10/01/18 1009 .  lacosamide (VIMPAT) 200 mg in sodium chloride 0.9 % 25 mL IVPB, 200 mg, Intravenous, Q12H, Kerney Elbe, MD, Last Rate: 90 mL/hr at 10/01/18 0856, 200 mg at 10/01/18 0856 .  levETIRAcetam (KEPPRA) IVPB 1000 mg/100 mL premix, 1,000  mg, Intravenous, Q12H, Kerney Elbe, MD, Last Rate: 400 mL/hr at 10/01/18 1012, 1,000 mg at 10/01/18 1012 .  lisinopril (ZESTRIL) tablet 10 mg, 10 mg, Oral, Daily, Neva Seat, MD, 10 mg at 10/01/18 1008 .  metoprolol tartrate (LOPRESSOR) tablet 50 mg, 50 mg, Oral, BID, Neva Seat, MD, 50 mg at  10/01/18 1008 .  phenytoin (DILANTIN) injection 100 mg, 100 mg, Intravenous, Q8H, Kerney Elbe, MD, 100 mg at 10/01/18 0520 .  sodium chloride flush (NS) 0.9 % injection 3 mL, 3 mL, Intravenous, Q12H, Neva Seat, MD, 3 mL at 09/30/18 2226 .  valproate (DEPACON) 2,000 mg in dextrose 5 % 50 mL IVPB, 2,000 mg, Intravenous, Once, Neva Seat, MD, Stopped at 09/29/18 0857  Pertinent Labs/Diagnostics:   Ct Head Wo Contrast  Result Date: 09/29/2018 CLINICAL DATA:  Seizure.  Recent stroke. EXAM: CT HEAD WITHOUT CONTRAST CT CERVICAL SPINE WITHOUT CONTRAST TECHNIQUE: Multidetector CT imaging of the head and cervical spine was performed following the standard protocol without intravenous contrast. Multiplanar CT image reconstructions of the cervical spine were also generated. COMPARISON:  MR brain dated September 01, 2018. CT head and neck dated August 31, 2018. FINDINGS: CT HEAD FINDINGS Brain: No cortically based acute infarct identified. Chronic right parietal lobe encephalomalacia again noted. No hemorrhage, hydrocephalus, extra-axial collection or mass lesion/mass effect. Vascular: Atherosclerotic vascular calcification of the carotid siphons. No hyperdense vessel. Skull: Negative for fracture or focal lesion. Poor dentition with multiple dental caries and periapical abscesses. Sinuses/Orbits: No acute finding. Other: None. CT CERVICAL SPINE FINDINGS Alignment: Normal. Skull base and vertebrae: No acute fracture. No primary bone lesion or focal pathologic process. Soft tissues and spinal canal: No prevertebral fluid or swelling. No visible canal hematoma. Disc levels: Scattered mild endplate spurring with relative preservation of the disc heights. Upper chest: Negative. Other: Nasopharyngeal airway terminating in the oropharynx. IMPRESSION: 1. Stable non-contrast CT appearance of the brain. Chronic right parietal lobe encephalomalacia. 2.  No acute cervical spine fracture. Electronically Signed   By: Titus Dubin M.D.   On: 09/29/2018 08:52   Ct Cervical Spine Wo Contrast  Result Date: 09/29/2018 CLINICAL DATA:  Seizure.  Recent stroke. EXAM: CT HEAD WITHOUT CONTRAST CT CERVICAL SPINE WITHOUT CONTRAST TECHNIQUE: Multidetector CT imaging of the head and cervical spine was performed following the standard protocol without intravenous contrast. Multiplanar CT image reconstructions of the cervical spine were also generated. COMPARISON:  MR brain dated September 01, 2018. CT head and neck dated August 31, 2018. FINDINGS: CT HEAD FINDINGS Brain: No cortically based acute infarct identified. Chronic right parietal lobe encephalomalacia again noted. No hemorrhage, hydrocephalus, extra-axial collection or mass lesion/mass effect. Vascular: Atherosclerotic vascular calcification of the carotid siphons. No hyperdense vessel. Skull: Negative for fracture or focal lesion. Poor dentition with multiple dental caries and periapical abscesses. Sinuses/Orbits: No acute finding. Other: None. CT CERVICAL SPINE FINDINGS Alignment: Normal. Skull base and vertebrae: No acute fracture. No primary bone lesion or focal pathologic process. Soft tissues and spinal canal: No prevertebral fluid or swelling. No visible canal hematoma. Disc levels: Scattered mild endplate spurring with relative preservation of the disc heights. Upper chest: Negative. Other: Nasopharyngeal airway terminating in the oropharynx. IMPRESSION: 1. Stable non-contrast CT appearance of the brain. Chronic right parietal lobe encephalomalacia. 2.  No acute cervical spine fracture. Electronically Signed   By: Titus Dubin M.D.   On: 09/29/2018 08:52   Mr Brain Wo Contrast  Result Date: 09/29/2018 CLINICAL DATA:  Schizophrenia and dementia.  Found on  the ground. EXAM: MRI HEAD WITHOUT CONTRAST TECHNIQUE: Multiplanar, multiecho pulse sequences of the brain and surrounding structures were obtained without intravenous contrast. COMPARISON:  Head CT same day.  MRI 09/01/2018  FINDINGS: Brain: Diffusion imaging does not show any acute or subacute insult today. No brainstem abnormality is seen. There are a few old small vessel cerebellar infarctions. 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 Less swelling and mass effect. 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 No mass lesion, hemorrhage, hydrocephalus or extra-axial collection. Minimal hemosiderin deposition in the region of prior infarction. Vascular: Major vessels at the base of the brain show flow. 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 Developing volume loss and gliosis. No evidence of any additional extension or new insult. 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 Electronically Signed   By: Nelson Chimes M.D.   On: 09/29/2018 14:22   Dg Chest Portable 1 View  Result Date: 09/29/2018 CLINICAL DATA:  Seizure this morning.  Chest congestion. EXAM: PORTABLE CHEST 1 VIEW COMPARISON:  09/03/2018 FINDINGS: The cardiac silhouette, mediastinal and hilar contours are within normal limits and stable. Stable mild tortuosity and calcification of the thoracic aorta. The lungs are clear. No pleural effusion. No worrisome pulmonary lesions. The bony thorax is intact. IMPRESSION: No acute cardiopulmonary findings. Electronically Signed   By: Marijo Sanes M.D.   On: 09/29/2018 09:22    Posey Pronto PA-C [facility] [phone]  Assessment: 73 year old male with recurrence of seizures manifesting as status epilepticus. 1.Currently on keppra, dilantin and vimpat for seizure suppression. Had been loaded with Depakote once on 7/3. Will increase Keppra to 2000 mg BID given continued left hand twitching. If still with seizure activity after increased dose of Keppra. Will  consider reloading valproic acid and starting maintenance dosing of 5 mg/kg TID, versus starting phenobarbital. Will hold off on burst suppression for now.  2. LTM EEG will be started today.  3. MRI brain on 7/3 revealed expected evolutionary changes of the late subacute infarction in the right MCA territory. Developing volume loss and gliosis. No evidence of any additional extension or new insult. Older infarction in the right parietal region also noted.  Recommendations: 1. EEG technician has been called. Will review LTM EEG at the bedside.  2. Increasing Keppra to 2000 mg IV BID, by adding 1000 mg IV bolus now and then continuing at 2000 mg BID starting at 9 PM. Continue Vimpat at 200 mg BID and Dilantin at 100 mg TID.   Electronically signed: Dr. Kerney Elbe 10/01/2018, 7:59 AM

## 2018-10-02 DIAGNOSIS — I110 Hypertensive heart disease with heart failure: Secondary | ICD-10-CM

## 2018-10-02 LAB — BASIC METABOLIC PANEL
Anion gap: 9 (ref 5–15)
BUN: 5 mg/dL — ABNORMAL LOW (ref 8–23)
CO2: 26 mmol/L (ref 22–32)
Calcium: 8.9 mg/dL (ref 8.9–10.3)
Chloride: 105 mmol/L (ref 98–111)
Creatinine, Ser: 0.76 mg/dL (ref 0.61–1.24)
GFR calc Af Amer: >60 mL/min (ref >60)
GFR calc non Af Amer: >60 mL/min (ref >60)
Glucose, Bld: 94 mg/dL (ref 70–99)
Potassium: 3.7 mmol/L (ref 3.5–5.1)
Sodium: 140 mmol/L (ref 135–145)

## 2018-10-02 LAB — MRSA PCR SCREENING: MRSA by PCR: NEGATIVE

## 2018-10-02 LAB — PHENYTOIN LEVEL, TOTAL: Phenytoin Lvl: 23.4 ug/mL — ABNORMAL HIGH (ref 10.0–20.0)

## 2018-10-02 MED ORDER — ONDANSETRON HCL 4 MG/2ML IJ SOLN
4.0000 mg | Freq: Four times a day (QID) | INTRAMUSCULAR | Status: DC | PRN
  Administered 2018-10-02: 4 mg via INTRAVENOUS
  Filled 2018-10-02: qty 2

## 2018-10-02 MED ORDER — JEVITY 1.2 CAL PO LIQD
1000.0000 mL | ORAL | Status: DC
  Filled 2018-10-02 (×2): qty 1000

## 2018-10-02 MED ORDER — PHENYTOIN SODIUM 50 MG/ML IJ SOLN: 50.0000 mg | Freq: Three times a day (TID) | INTRAMUSCULAR | Status: DC

## 2018-10-02 MED ORDER — VALPROATE SODIUM 500 MG/5ML IV SOLN
500.0000 mg | Freq: Two times a day (BID) | INTRAVENOUS | Status: DC
  Administered 2018-10-02 – 2018-10-04 (×5): 500 mg via INTRAVENOUS
  Filled 2018-10-02 (×7): qty 5

## 2018-10-02 MED ORDER — SCOPOLAMINE 1 MG/3DAYS TD PT72
1.0000 | MEDICATED_PATCH | TRANSDERMAL | Status: DC
  Administered 2018-10-02 – 2018-10-14 (×5): 1.5 mg via TRANSDERMAL
  Filled 2018-10-02 (×6): qty 1

## 2018-10-02 NOTE — Progress Notes (Signed)
  Speech Language Pathology Treatment: Dysphagia  Patient Details Name: Ronald [name] MRN: [id number] DOB: Mar 28, 1946 Today's Date: 10/02/2018 Time:  -     Assessment / Plan / Recommendation Clinical Impression  Pts mentation has declined since yesterday per notes and he has struggled with PO diet and has shown signs of aspiration to staff. Today upon SLP arrival he is alert but almost completely unintelligible. At times he rolls his eyes back in his head and becomes much less responsive. Significant repositioning needed to help pt sit up appropriately for PO. SLP also needed to given extensive verbal, visual and tactile cueing for pt to appropriately recognize cup spoon and straw. During sips and bites he continues talking unintelligibly with multiple intermittent reflexive swallows. Wet vocal quality noted and concern for silent aspiration is high given possible decreased sensation.   At this time pts mentation is not consistently appropriate for POs and he is at high risk of aspiration. He may also be inconsistent in his ability to take oral medication even with careful precautions. Recommend pt be NPO with alternate method of nutrition in the short term. Will proceed with MBS when pt is off the EEG. Will check in tomorrow.    HPI HPI: Ronald [name] is a 73 year old man with PMH of treated prostate cancer, heart disease, HLD, HTN, schizophrenia, multiple TIA, stroke (6/4 most recent), dementia.  He was found to be seizing in his SNF today and was sent to the ED, when we saw him he was somnolent, likely due to a mix of post ictal state and medications.  GCS 6. Abnormal EEG 7/4.  MRI 7/3: "Expected evolutionary changes of the late subacute infarction in the right MCA territory. Developing volume loss and gliosis. No evidence of any additional extension or new insult. Older infarction in the right parietal region as seen previously."      SLP Plan  MBS       Recommendations  Diet  recommendations: NPO Medication Administration: Via alternative means                Oral Care Recommendations: Oral care QID Follow up Recommendations: Skilled Nursing facility SLP Visit Diagnosis: Dysphagia, oropharyngeal phase (R13.12) Plan: MBS       GO               Herbie Baltimore, MA CCC-SLP  Acute Rehabilitation Services Pager [phone] Office [phone]  Lynann Beaver 10/02/2018, 11:22 AM

## 2018-10-02 NOTE — Progress Notes (Addendum)
NEUROLOGY PROGRESS NOTE  Subjective: Patient at this point time is extremely drowsy.  Will awaken to stimuli but falls back to sleep immediately.  He was able to tell me that he is doing well but I could get no other conversation out of him.  At the end of follow-up he was having significant cough attacks to which the nurse was going to address and suction him.  Exam: Vitals:   10/02/18 0342 10/02/18 0844  BP: (!) 165/70 (!) 143/78  Pulse: 66 73  Resp: 17 17  Temp: 99.1 F (37.3 C) 99.4 F (37.4 C)  SpO2: 100% 100%    Physical Exam   HEENT-  Normocephalic, no lesions, without obvious abnormality.  Normal external eye and conjunctiva.   Extremities- Warm, dry and intact Musculoskeletal-no joint tenderness, deformity or swelling Skin-warm and dry, no hyperpigmentation, vitiligo, or suspicious lesions    Neuro:  Mental Status: Patient at this point in time is extremely drowsy but will open eyes to stimuli, follows command to squeezing my hand especially with the right side.  He was able to state that he is doing well but I could get no other conversation out of him. Cranial Nerves: II: Blink to threat bilaterally III,IV, VI: Eyes conjugate when looking at me, when I open his eyelids they are disconjugate.  He was able to follow my finger from left to right with no difficulty V,VII: Face with left facial droop VIII: hearing intact to my voice  Motor: Difficult to assess however he was able to squeeze my hand on the right to command did not move above bed no significant response to noxious stimuli however I believe this could be secondary to significant drowsiness, left upper extremity had increased tone and did not do any significant motion with noxious stimuli, with plantar stimulation on the right no significant motion however on the left he did withdrawal left upper extremity twitching noted Sensory: Withdrew to noxious stimuli in upper and lower extremities Deep Tendon Reflexes:  No ankle jerk, no significant knee jerk with 3+ the bilateral upper extremities Plantars: Right: downgoing   Left: Upgoing     Medications:  Scheduled: . aspirin  300 mg Rectal Daily   Or  . aspirin  325 mg Oral Daily  . atorvastatin  80 mg Oral Daily  . clopidogrel  75 mg Oral Daily  . enoxaparin (LOVENOX) injection  40 mg Subcutaneous Q24H  . feeding supplement (ENSURE ENLIVE)  237 mL Oral BID BM  . fluticasone  1 spray Each Nare Daily  . lisinopril  10 mg Oral Daily  . metoprolol tartrate  50 mg Oral BID  . phenytoin (DILANTIN) IV  100 mg Intravenous Q8H  . sodium chloride flush  3 mL Intravenous Q12H   Continuous: . lacosamide (VIMPAT) IV 200 mg (10/01/18 2146)  . levETIRAcetam 2,000 mg (10/02/18 0856)   PRN:  Pertinent Labs/Diagnostics: EEG: LTM showed 1.  Mild background activity slowing and disorganization suggestive encephalopathy and nonspecific etiologies. 2.  Right hemispheric delta slowing and 3.  Right hemispheric periodic epileptiform discharges maximal negativity in the right central parietal cortex suggestive of irritability.  Electrographic seizures cannot be excluded as discussed above  Persistent right hemispheric delta slowing however at times right periodic epileptiform discharges becoming rhythmic, synchronized and organized concerning for electrographic seizures  Corrected Dilantin level is 33.2    Assessment: 73 year old male with recurrent seizures manifesting as status epilepticus.  At this point he is on Keppra 2000 mg twice daily, Dilantin  100 mg 3 times daily, Vimpat 200 mg twice daily.  As noted in previous assessment MRI of the brain on 7/3 did reveal expected evolution changes of the late subacute infarction in the right MCA territory.  No evidence of additional extension or new insult.  Older infarction in the right parietal region also noted.    Recommendations: -Continue LTM -Continue current AEDs -will hold dose dilantin and then start  back at 50 mg TID due to corrected dilantin today of 33 10/02/2018, 10:35 AM  Etta Quill PA-C [facility] [phone]   I have seen the patient reviewed the above note.  At this point, I think that much of his sedation is likely related to medication effect.  Despite aggressive medications, he continues to have discharges and there is concern for continued cortical irritability/continued seizures.  I do think that continued medication regimen modification is necessary, but I would favor not simply adding but rather substituting.  He does wake up for me and follows simple commands.  I will discontinue Dilantin and start Depakote 500 mg every 12 hours.  If he does not improve, may need to consider more aggressive therapy.  Roland Rack, MD [facility] [phone]  If 7pm- 7am, please page neurology on call as listed in [place].

## 2018-10-02 NOTE — Procedures (Signed)
encephalogram report- LTM with VIDEO  Beginning time: 10/01/18 AT 1623 Ending time:  10/02/18 AT 0730 CPT/type : 41287  Day of study: day 1    Technical Description: The EEG was performed using standard setting per the guidelines of American Clinical Neurophysiology Society (ACNS).    A minimum of 21 electrodes were placed on scalp according to the International 10-20 or/and 10-10 Systems. Supplemental electrodes were placed as needed. Single EKG electrode was also used to detect cardiac arrhythmia. Patient's behavior was continuously recorded on video simultaneously with EEG. A minimum of 16 channels were used for data display. Each epoch of study was reviewed manually daily and as needed using standard referential and bipolar montages. Computerized quantitative EEG analysis (such as compressed spectral array analysis, trending, automated spike & seizure detection) were used as indicated.    Spike detection: ON  Seizure detection: ON   This day1 of   continuous  EEG monitoring with simultaneous video monitoring was performed for this patient with convulsions to rule out subclinical electrographic seizures.  Medications: As per EMR  There was no pushbutton activations events during this recording.  Background activities marked by disorganized mild background activity slowing across left hemisphere.  Persistent right hemispheric delta slowing present throughout the recording.  Superimposed persistent right hemispheric periodic lateralized epileptiform discharges frequency 1 cps maximum negativity in the right central parietal cortex.  At times right periodic  epileptiform discharges become rhythmic, synchronized and organized concerning for electrographic seizures.  Clinical interpretation: This day 1 of continuous EEG monitoring with simultaneous video monitoring was abnormal for several reasons.  #1 mild background activity slowing and disorganization suggestive of mild encephalopathy of  nonspecific etiologies.  #2 right hemispheric delta slowing consistent with neuronal dysfunction on a structural vascular or degenerative basis across right hemisphere.  #3 right hemispheric periodic  epileptiform discharges with maximum negativity in the right central parietal cortex suggestive of cortical irritability in that region.  Electrographic seizures cannot be excluded as discussed above.  Continuous monitoring is recommended to ensure improvement.  Clinical correlation is advised.

## 2018-10-02 NOTE — Progress Notes (Signed)
LTM maintenance completed; Fp1, Fp2, and EKG leads all reprepped. No skin breakdown was seen.

## 2018-10-02 NOTE — Progress Notes (Addendum)
Contacted Patient's sister, Ronald [name], by phone [phone]) and updated her on her brother's status. She was informed that his MRI showed expected changes for prior stroke and that his decline in status may be a combination of things including his seizure, unmasking of stroke deficits, or another entity. We discussed his inability to swallow without risk of aspiration. She states she would want for him to have a temporary feeding tube placed to provided nutrition while he works to recover. We will continue to monitor and discuss his condition over the coming days. We will also look into allowing sister to visit considering she has been his surrogate decision maker and patient is difficult to understand most of the time due to dysarthria. - Place Cortrak - Begin tube feeds - Nutrition consult  Pearson Grippe, DO IM PGY-3  ADDENDUM: Cortrak team unable to pass cortrak though pylorus. IR consulted to eval and advance tube.

## 2018-10-02 NOTE — Progress Notes (Signed)
   Subjective: Pt was seen on AM rounds today. Ronald [name] says he feels fine. He says he is ready to eat some breakfast. He's oriented x3. Of note he was coughing frequently this AM, but says he has chronic bronchitis.   Objective:  Vital signs in last 24 hours: Vitals:   10/01/18 2116 10/02/18 0047 10/02/18 0342 10/02/18 0844  BP: (!) 153/75 (!) 155/87 (!) 165/70 (!) 143/78  Pulse: 72 67 66 73  Resp: 12 16 17 17   Temp: 98.7 F (37.1 C) 98.8 F (37.1 C) 99.1 F (37.3 C) 99.4 F (37.4 C)  TempSrc: Oral Oral Oral Axillary  SpO2: 95% 98% 100% 100%  Weight:      Height:       Physical Exam Constitutional:      General: He is not in acute distress.    Appearance: Normal appearance. He is not ill-appearing or toxic-appearing.  HENT:     Head: Normocephalic and atraumatic.  Cardiovascular:     Rate and Rhythm: Normal rate and regular rhythm.     Pulses: Normal pulses.     Heart sounds: Normal heart sounds. No murmur. No friction rub. No gallop.   Pulmonary:     Effort: Pulmonary effort is normal. No respiratory distress.     Breath sounds: Normal breath sounds. No wheezing or rales.     Comments: Coughing   Musculoskeletal:     Right lower leg: No edema.     Left lower leg: No edema.  Neurological:     Mental Status: He is alert and oriented to person, place, and time.     Sensory: Sensory deficit (L>>R, at baseline) present.     Motor: Weakness (L>>R, at baseline) present.     Comments: DTR intact. LUE hyperreflexia   Psychiatric:        Mood and Affect: Mood normal.     Assessment/Plan:  Active Problems:   Seizure ([place])   Seizure-like activity ([facility])  In summary, Ronald [name] is a 73 y.o male with a PMHx significant for prostate cancer, dementia, heart disease, hyperlipidemia, HTN, measles, mumps, schizophrenia, multiple TIA, and stroke who presented today with tonic clonic seizures, suspected to besecondary to a R sided subacute stroke in mid June confirmed by CT  headand MRI  #Seizures: The descriptions of full body convulsions that were improved by Versed is consistent with generalized tonic-clonic seizure. The first siezure at the nursing home was resolved with 5mg  of Versed once EMS arrived. The patient seized again in the ED where 5mg totalof Ativan and 2000 mgofIV Keppra were administered. Subsequent EEG monitoring did not reveal seizure like activity.neurologic exam has remained unchanged - Appreciate neurology recs.   - LTMEEG at the bedside - Dilantin 100mg  q8h - Keppra increased to 2000mg  q12h - Vimpat 200mg  q12h - Ativan PRN   #Hypokalemia: K 3.4 on 7/4. Mg WNL.  - Repleting PO with 65mEq BID - BMP  #R parietal subacute stroke: The findings on CT head consistent with previous R MCA stroke w/ L sided deficits that are unchanged. Repeat MRI did not find any evidence of additional injury or insult.  - Atorvastatin 81 mg daily -Plavix 74mg  daily - Lovenox 40mg  injection daily - Aspirin 325 mg daily  #HTN -Metoprolol50mg  PO BID - Lisinopril 10mg  PO daily  #FENGI - Aspirtation risk, currently NPO > Swallow eval > Now Dys 1  Dispo: Anticipated discharge in approximately 3 day [name], MD 10/02/2018, 9:10 AM Pager: [phone]

## 2018-10-02 NOTE — Progress Notes (Signed)
  Date: 10/02/2018  Patient name: Ronald [name]  Medical record number: [id number]  Date of birth: 1945/08/06   I have seen and evaluated this patient and I have discussed the plan of care with the house staff. Please see their note for complete details. I concur with their findings with the following additions/corrections:   Unfortunately, having more cough and noted to have difficulty swallowing today. Reassessed by SLP, reocmmending NPO with alternate nutrition, will plan for NG tube.  Lenice Pressman, M.D., Ph.D. 10/02/2018, 1:57 PM

## 2018-10-02 NOTE — Progress Notes (Signed)
Pt had a total output of 200 ml over 12 hours. RN bladder scanned pt, and he only had a maximum 90 ml in his bladder. RN will continue to monitor pt's output.

## 2018-10-02 NOTE — Progress Notes (Signed)
Cortrak Tube Team Note:  Consult received to place a Cortrak feeding tube. RD was able to place tube post-pyloric after 30 minutes but patient removed tube immediately. Will hold off on tube for now per RN.   Koleen Distance MS, RD, LDN Pager #- [phone] Office#- [phone] After Hours Pager: [phone]

## 2018-10-02 NOTE — Progress Notes (Signed)
PT Cancellation Note  Patient Details Name: Ronald [name] MRN: [id number] DOB: [birth year]   Cancelled Treatment:    Reason Eval/Treat Not Completed: Active bedrest order Will need updated activity orders prior to evaluation. Will follow.    Lanney Gins, PT, DPT Supplemental Physical Therapist 10/02/18 9:17 AM Pager: [phone] Office: [phone]

## 2018-10-02 NOTE — Progress Notes (Signed)
Initial Nutrition Assessment  DOCUMENTATION CODES:   Obesity unspecified  INTERVENTION:   -Once feeding access is obtained:  Initiate Osmolite 1.5 @ 20 ml/hr and increase by 10 ml every 4 hours to goal rate of 50 ml/hr.   30 ml Prostat daily.    Tube feeding regimen provides 1900 kcal (100% of needs), 90 grams of protein, and 914 ml of H2O.   -D/c Ensure Enlive po BID, each supplement provides 350 kcal and 20 grams of protein -D/c Jevity 1.2  NUTRITION DIAGNOSIS:   Inadequate oral intake related to inability to eat as evidenced by NPO status.  GOAL:   Patient will meet greater than or equal to 90% of their needs  MONITOR:   Diet advancement, Labs, Weight trends, Skin, I & O's  REASON FOR ASSESSMENT:   Consult Enteral/tube feeding initiation and management  ASSESSMENT:   Ronald [name] is a 73 y.o male with a PMHx significant of prostate cancer, dementia, heart disease, hyperlipidemia, HTN, measles, mumps, schizophrenia, multiple TIA, stroke (most recent R MCA infarct w/ L sided deficits on 6/4) who lives in a SNF who presents to the hospital for a tonic clonic seizure. He was initially found on the floor by nurses showing seizure like activity. Per nursing, the patient's sister says he seized for about 30 minutes prior till EMS arrival. He was given 5 mg of Versed IM once EMS arrived which ended the seizure like activity. When he arrived at the ED, he started having seizure like activity again, exhibiting left sided tonic-clonic jerks. He also had a leftward deviated gaze.  He was given 5mg  total of Ativan and 2000 mg IV Keppra load. Neurology also evaluated and subsequently performed an EEG which did not show any more seizure activity.  Pt admitted with tonic clonic seizure secondary to stroke.  Reviewed I/O's: -895 ml x 24 hours and -590 ml since admission  UOP: 1.3 L x 24 hours  Pt currently NPO. Per MD notes, pt family amenable to temporary feeding tube  placement.  Spoke with cortrak tube team RD, who reports that pt just pulled tube out after procedure. Tube placement currently on hold.  Pt unable to provide history, but with some orientation. When this RD asked why he pulled his tube out, he motioned his arm to the trash can where the tube was placed.   Reviewed wt hx. No wt loss over the past 6 months.  Labs reviewed: CBGS: 127.   NUTRITION - FOCUSED PHYSICAL EXAM:    Most Recent Value  Orbital Region  No depletion  Upper Arm Region  No depletion  Thoracic and Lumbar Region  No depletion  Buccal Region  No depletion  Temple Region  No depletion  Clavicle Bone Region  No depletion  Clavicle and Acromion Bone Region  No depletion  Scapular Bone Region  No depletion  Dorsal Hand  No depletion  Patellar Region  No depletion  Anterior Thigh Region  No depletion  Posterior Calf Region  No depletion  Edema (RD Assessment)  None  Hair  Reviewed  Eyes  Reviewed  Mouth  Reviewed  Skin  Reviewed  Nails  Reviewed       Diet Order:   Diet Order            Diet NPO time specified  Diet effective now              EDUCATION NEEDS:   Not appropriate for education at this time  Skin:  Skin Assessment: Reviewed RN Assessment Stage I: -  Last BM:  09/29/18  Height:   Ht Readings from Last 1 Encounters:  09/29/18 5\' 8"  (1.727 m)    Weight:   Wt Readings from Last 1 Encounters:  09/29/18 97.5 kg    Ideal Body Weight:  70 kg  BMI:  Body mass index is 32.68 kg/m.  Estimated Nutritional Needs:   Kcal:  9371-6967  Protein:  90-105 grams  Fluid:  > 1.7 L    Quenton Recendez A. Jimmye Norman, RD, LDN, [place] Registered Dietitian II Certified Diabetes Care and Education Specialist Pager: [phone] After hours Pager: [phone]

## 2018-10-02 NOTE — Progress Notes (Signed)
Pt is NPO per speech therapy recommendation. Pt's risk for aspiration has increased. He had a coughing episode when [place] PA assessed pt in which RN suctioned mouth (little had been coughed up at time). RN just suctioned pt again as he presents to have increasing difficulty swallowing his own saliva. MD paged about change. RN will continue to monitor pt.

## 2018-10-02 NOTE — Progress Notes (Signed)
OT Cancellation Note  Patient Details Name: Ronald [name] MRN: [id number] DOB: 1945/11/25   Cancelled Treatment:    Reason Eval/Treat Not Completed: Patient not medically ready;Active bedrest order will need updated activity orders prior to evaluation. Will continue to follow and return when pt is appropriate and as time allows   Dorinda Hill OTR/L [place], [place] Office: [place] 10/02/2018, 10:38 AM

## 2018-10-02 NOTE — Progress Notes (Signed)
RN spoke to pt's sister Florian Buff. She asked about pt's overall baseline and requested for MD to call her. RN updated sister and notified MD of her request.

## 2018-10-02 NOTE — Progress Notes (Signed)
Cortrak team attempted multiple times to place cortrak. Pt continually tried to pull it out during inserting it. Cortrak was able to place it and placed bridle. Despite pt having mitten restraint on right hand, pt pulled cortrak out. RN paged MD to notify pt not tolerating cortrak. Will continue to monitor.

## 2018-10-03 DIAGNOSIS — R1310 Dysphagia, unspecified: Secondary | ICD-10-CM

## 2018-10-03 LAB — GLUCOSE, CAPILLARY: Glucose-Capillary: 122 mg/dL — ABNORMAL HIGH (ref 70–99)

## 2018-10-03 LAB — PHENYTOIN LEVEL, TOTAL: Phenytoin Lvl: 22.6 ug/mL — ABNORMAL HIGH (ref 10.0–20.0)

## 2018-10-03 LAB — VALPROIC ACID LEVEL: Valproic Acid Lvl: 17 ug/mL — ABNORMAL LOW (ref 50.0–100.0)

## 2018-10-03 MED ORDER — VALPROATE SODIUM 500 MG/5ML IV SOLN
1000.0000 mg | Freq: Once | INTRAVENOUS | Status: AC
  Administered 2018-10-03: 1000 mg via INTRAVENOUS
  Filled 2018-10-03: qty 10

## 2018-10-03 MED ORDER — SODIUM CHLORIDE 0.9 % IV SOLN
150.0000 mg | Freq: Two times a day (BID) | INTRAVENOUS | Status: DC
  Administered 2018-10-03 – 2018-10-08 (×12): 150 mg via INTRAVENOUS
  Filled 2018-10-03 (×14): qty 15

## 2018-10-03 MED ORDER — RESOURCE THICKENUP CLEAR PO POWD
ORAL | Status: DC | PRN
  Filled 2018-10-03 (×2): qty 125

## 2018-10-03 MED ORDER — ADULT MULTIVITAMIN W/MINERALS CH
1.0000 | ORAL_TABLET | Freq: Every day | ORAL | Status: DC
  Administered 2018-10-03 – 2018-10-16 (×13): 1 via ORAL
  Filled 2018-10-03 (×13): qty 1

## 2018-10-03 MED ORDER — LEVETIRACETAM IN NACL 1500 MG/100ML IV SOLN
1500.0000 mg | Freq: Two times a day (BID) | INTRAVENOUS | Status: DC
  Administered 2018-10-03 – 2018-10-08 (×12): 1500 mg via INTRAVENOUS
  Filled 2018-10-03 (×13): qty 100

## 2018-10-03 NOTE — Progress Notes (Signed)
PT Cancellation Note  Patient Details Name: Ronald [name] MRN: [id number] DOB: 02-21-1946   Cancelled Treatment:    Reason Eval/Treat Not Completed: Active bedrest order Continued bedrest orders - PT spoke to nursing staff - advised to continue to hold PT. Nursing to page PT if orders are lifted. Will continue to follow.    Lanney Gins, PT, DPT Supplemental Physical Therapist 10/03/18 10:13 AM Pager: [phone] Office: [phone]

## 2018-10-03 NOTE — Progress Notes (Signed)
Subjective: We tried placing an NG tube yesterday for feeding but patient pulled it out x2 with soft mitten. Patient is communicating slightly better today, but it is hard to understand all of what Ronald [name] says. Conversation is not completely logical, but Ronald [name] is putting together sentences. Ronald [name] has been cleared to visit and Ronald [name] says she is bossy. Ronald [name] reads doctors ID's and talks a little to everyone in the room.  Objective:  Vital signs in last 24 hours: Vitals:   10/02/18 0342 10/02/18 0844 10/02/18 2000 10/03/18 0700  BP: (!) 165/70 (!) 143/78 (!) 155/69 (!) 155/80  Pulse: 66 73 69 78  Resp: 17 17  18   Temp: 99.1 F (37.3 C) 99.4 F (37.4 C) 98.1 F (36.7 C) 97.8 F (36.6 C)  TempSrc: Oral Axillary Oral   SpO2: 100% 100% 96% 100%  Weight:      Height:       Physical Exam Constitutional:      General: Ronald [name] is not in acute distress.    Appearance: Normal appearance. Ronald [name] is normal weight. Ronald [name] is not ill-appearing, toxic-appearing or diaphoretic.  HENT:     Head: Normocephalic and atraumatic.  Eyes:     Extraocular Movements: Extraocular movements intact.     Pupils: Pupils are equal, round, and reactive to light.  Cardiovascular:     Rate and Rhythm: Normal rate and regular rhythm.     Pulses: Normal pulses.     Heart sounds: Normal heart sounds. No murmur. No friction rub. No gallop.   Pulmonary:     Effort: Pulmonary effort is normal. No respiratory distress.     Breath sounds: Normal breath sounds. No wheezing or rales.  Abdominal:     General: Abdomen is flat. Bowel sounds are normal. There is no distension.     Palpations: Abdomen is soft.     Tenderness: There is no abdominal tenderness. There is no guarding.  Musculoskeletal:     Comments: Spontaneously moves RU & RL extremities >> RU &LU extremities  LUE small, low amplitude involuntary  jerks   Neurological:     Mental Status: Ronald [name] is alert.    Assessment/Plan:  Principal Problem:   Seizure ([place]) Active  Problems:   Seizure-like activity ([place])  In summary, Ronald [name] is a 73 y.o male with a PMHx significant for prostate cancer, dementia, heart disease, hyperlipidemia, HTN, measles, mumps, schizophrenia, multiple TIA, and stroke who presented today with tonic clonic seizures, suspected to besecondary to a R sided subacute stroke in mid June confirmed by head imaging. CT head showed no acute bleedsand MRI showed subacute stroke with no new findings. The patient's [name] received clearance to be in the hospital. Will have discussion with her when able.  #Seizures: The descriptions of full body convulsions that were improved by Versed is consistent with generalized tonic-clonic seizure. The first siezure at the nursing home was resolved with 5mg  of Versed once EMS arrived. The patient seized again in the ED where 5mg totalof Ativan and 2000 mgofIV Keppra were administered. Subsequent EEG monitoring did not reveal seizure like activity.neurologic exam has remained unchanged. Of note, the patient is having involuntary LUE low amplitude jerks. It does not appear to be seizure like activity, but we will continue to monitor. - Appreciate neurology recs. - Dilantin 100mg q8h - Keppraincreased to 2000mg  q12h -Vimpat 200mg  q12h - Ativan PRN  #Hypokalemia:K 3.4 on 7/4. Mg WNL.  - Repleting PO with 71mEq BID - BMP every other day, as labs have  been stable.  #R parietal subacute stroke: The findings on CT head and MRI consistent with previous R MCA stroke w/ L sided deficits that are unchanged.  - Atorvastatin 81 mg daily -Plavix 74mg  daily - Lovenox 40mg  injection daily - Aspirin 325 mg daily  #HTN -Metoprolol50mg  PO BID - Lisinopril 10mg  PO daily  #FENGI - Aspirtation risk, SLP recs Dysphagia 1(puree); honey-thick liquids via teaspoon. Asked nurse to crush medications in applesauce.  Dispo: Anticipated discharge dependent on SNF placement.

## 2018-10-03 NOTE — Procedures (Signed)
LTM-EEG Report  HISTORY: Continuous video-EEG monitoring performed for 73 year old with altered mental status, seizures. ACQUISITION: International 10-20 system for electrode placement; 18 channels with additional eyes linked to ipsilateral ears and EKG. Additional T1-T2 electrodes were used. Continuous video recording obtained.   EEG NUMBER:  MEDICATIONS:  Day 2: see EMR  DAY #2: from 0730 10/02/18 to 0730 10/03/18 BACKGROUND: An overall medium voltage continuous recording with some spontaneous variability and reactivity. Waking background consisted of a medium voltage 9 Hz posterior dominant rhythm over the left (decreased over the right) with low voltage beta activity in the bilateral frontocentral regions and some medium voltage theta activity diffusely. Irregular focal slowing was seen in the right central region throughout the record. Sleep was captured with normal stage II sleep architecture.   EPILEPTIFORM/PERIODIC ACTIVITY: Frequent/nearly continuous right central spiky LPDs without ictal evolution SEIZURES: no clear ictal evolution to LPDs EVENTS: none reported  EKG: no significant arrhythmia  SUMMARY: This was an abnormal continuous video EEG due to focal periodic discharges in the right central regions with intermixed slowing. No seizures were seen. This was consistent with a focal epileptogenic disturbance on the right.

## 2018-10-03 NOTE — Progress Notes (Signed)
Nutrition Follow-up  DOCUMENTATION CODES:   Obesity unspecified  INTERVENTION:   -D/c Ensure Enlive po BID, each supplement provides 350 kcal and 20 grams of protein -MVI with minerals daily -Magic cup TID with meals, each supplement provides 290 kcal and 9 grams of protein -[place] yogurt with each breakfast tray -Pudding with lunch and dinner trays -Feeding assistance with meals  NUTRITION DIAGNOSIS:   Inadequate oral intake related to lethargy/confusion, dysphagia as evidenced by meal completion < 25%.  Ongoing  GOAL:   Patient will meet greater than or equal to 90% of their needs  Progressing   MONITOR:   PO intake, Supplement acceptance, Diet advancement, Labs, Weight trends, Skin, I & O's  REASON FOR ASSESSMENT:   Consult Enteral/tube feeding initiation and management  ASSESSMENT:   Mr. Freiermuth is a 73 y.o male with a PMHx significant of prostate cancer, dementia, heart disease, hyperlipidemia, HTN, measles, mumps, schizophrenia, multiple TIA, stroke (most recent R MCA infarct w/ L sided deficits on 6/4) who lives in a SNF who presents to the hospital for a tonic clonic seizure. He was initially found on the floor by nurses showing seizure like activity. Per nursing, the patient's sister says he seized for about 30 minutes prior till EMS arrival. He was given 5 mg of Versed IM once EMS arrived which ended the seizure like activity. When he arrived at the ED, he started having seizure like activity again, exhibiting left sided tonic-clonic jerks. He also had a leftward deviated gaze.  He was given 5mg  total of Ativan and 2000 mg IV Keppra load. Neurology also evaluated and subsequently performed an EEG which did not show any more seizure activity.  7/6- cortrak tube placed, pt pulled out, tube placement on hold 7/7- s/p BSE- advanced to dysphagia 1 diet with honey thick liquids  Reviewed I/O's: +145 ml x 24 hours and -445 ml x 24 hours  Pt lying in bed at time of  visit. Pt unable to provide further history.   Pt just advanced to a dysphagia 1 diet with honey thick liquids; per SLP notes, may require instrumental testing if unable to tolerate. Meal completion poor; PO: 0-25%.   Labs reviewed.   Diet Order:   Diet Order            DIET - DYS 1 Room service appropriate? Yes; Fluid consistency: Honey Thick  Diet effective now              EDUCATION NEEDS:   No education needs have been identified at this time  Skin:  Skin Assessment: Reviewed RN Assessment Stage I: -  Last BM:  09/29/18  Height:   Ht Readings from Last 1 Encounters:  09/29/18 5\' 8"  (1.727 m)    Weight:   Wt Readings from Last 1 Encounters:  09/29/18 97.5 kg    Ideal Body Weight:  70 kg  BMI:  Body mass index is 32.68 kg/m.  Estimated Nutritional Needs:   Kcal:  4098-1191  Protein:  90-105 grams  Fluid:  > 1.7 L    Nguyet Mercer A. Jimmye Norman, RD, LDN, [place] Registered Dietitian II Certified Diabetes Care and Education Specialist Pager: [phone] After hours Pager: [phone]

## 2018-10-03 NOTE — Progress Notes (Addendum)
  Speech Language Pathology Treatment: Dysphagia  Patient Details Name: Ronald [name] MRN: [id number] DOB: Jun 20, 1945 Today's Date: 10/03/2018 Time: 2130-8657 SLP Time Calculation (min) (ACUTE ONLY): 21 min  Assessment / Plan / Recommendation Clinical Impression  Pt demonstrates improvement in arousal and speech intelligibility today. He remained alert through session and despite lingual weakness from CVA he was better able to compensate for deficits. SLP was able to reposition him and spoon feed pudding and honey thick liquids. Intermittent cues needed to reduce talking while orally manipulating PO, which was his main deficit. He takes extra time and effort fully transit the bolus and if talking during attempts there were some instances of wet vocal quality, but no coughing. Noted that after session, well after PO intake, pt was heard coughing from the hallway. He had spit some secretions on his chest. Appears to bo intermittently coughing on his oral secretions. Recommend initiating a puree and honey thick diet today with careful hand feeding. Will f/u for tolerance. May need objective testing if he struggles with this diet.   HPI HPI: [name] is a 73 year old man with PMH of treated prostate cancer, heart disease, HLD, HTN, schizophrenia, multiple TIA, stroke (6/4 most recent), dementia.  He was found to be seizing in his SNF today and was sent to the ED, when we saw him he was somnolent, likely due to a mix of post ictal state and medications.  GCS 6. Abnormal EEG 7/4.  MRI 7/3: "Expected evolutionary changes of the late subacute infarction in the right MCA territory. Developing volume loss and gliosis. No evidence of any additional extension or new insult. Older infarction in the right parietal region as seen previously."      SLP Plan  Continue with current plan of care       Recommendations  Diet recommendations: Dysphagia 1 (puree);Honey-thick liquid Liquids provided via:  Teaspoon Medication Administration: Crushed with puree Supervision: Patient able to self feed Compensations: Slow rate;Small sips/bites;Follow solids with liquid Postural Changes and/or Swallow Maneuvers: Seated upright 90 degrees                Oral Care Recommendations: Oral care BID Follow up Recommendations: Skilled Nursing facility SLP Visit Diagnosis: Dysphagia, oropharyngeal phase (R13.12) Plan: Continue with current plan of care       GO               Herbie [place], MA [place] Pager [phone] Office [phone]  Lynann Beaver 10/03/2018, 9:29 AM

## 2018-10-03 NOTE — Progress Notes (Signed)
OT Cancellation Note  Patient Details Name: Ronald [name] MRN: [id number] DOB: February 18, 1946   Cancelled Treatment:    Reason Eval/Treat Not Completed: Patient not medically ready;Active bedrest order Nsg advised to continue to hold OT at this time. Will continue to follow and await page from nsg if orders are lifted.   Dorinda Hill OTR/L Acute Rehabilitation Services Office: [place] 10/03/2018, 11:25 AM

## 2018-10-03 NOTE — Evaluation (Signed)
Physical Therapy Evaluation Patient Details Name: Ronald [name] MRN: [id number] DOB: 1945/11/12 Today's Date: 10/03/2018   History of Present Illness  Patient is a 73 y/o male presenting to the ED on 09/29/2018 with priamry complaints of Tonic clonic seizure secondary to stroke. CT showed R parietal lobe hypodensity consistent with a subacute stroke. Of note, recent hospital discharge on 09/14/2018 for L MCA stroke for which he received tPA and angioplasty of the R terminal ICA. Past medical history of prostate cancer, dementia, heart disease, hyperlipidemia, HTN, measles, mumps, schizophrenia, multiple TIA, stroke.    Clinical Impression  Patient admitted with the above listed diagnosis. Per chart review, prior resident of SNF - unsure of PLOF. Patient today requiring Max/Total A +2 for all bed level mobility with varying level of assist for seated balance. Patient today demonstrating reduced strength, balance, safety awareness, and general functional mobility. Due to current functional status will recommend return to SNF at discharge for continued therapies. PT to follow acutely.      Follow Up Recommendations SNF;Supervision/Assistance - 24 hour    Equipment Recommendations  Other (comment)(defer)    Recommendations for Other Services       Precautions / Restrictions Precautions Precautions: Fall Restrictions Weight Bearing Restrictions: No      Mobility  Bed Mobility Overal bed mobility: Needs Assistance Bed Mobility: Supine to Sit;Sit to Supine     Supine to sit: Max assist;Total assist;+2 for physical assistance Sit to supine: Total assist;+2 for physical assistance   General bed mobility comments: Max/Total A +2 for all bed level mobility; once sitting EOB patient using R UE to hold onto footboard for upright balance  Transfers                 General transfer comment: deferred  Ambulation/Gait                Stairs            Wheelchair  Mobility    Modified Rankin (Stroke Patients Only) Modified Rankin (Stroke Patients Only) Pre-Morbid Rankin Score: Moderately severe disability Modified Rankin: Severe disability     Balance Overall balance assessment: Needs assistance Sitting-balance support: Single extremity supported;Feet supported Sitting balance-Leahy Scale: Poor Sitting balance - Comments: varying levels of assist needed - up to Max A for upright balance                                     Pertinent Vitals/Pain Pain Assessment: Faces Faces Pain Scale: No hurt    Home Living Family/patient expects to be discharged to:: Skilled nursing facility                      Prior Function Level of Independence: Needs assistance         Comments: resident of SNF prior to admission - unsure of PLOF     Hand Dominance   Dominant Hand: Right    Extremity/Trunk Assessment   Upper Extremity Assessment Upper Extremity Assessment: Defer to OT evaluation    Lower Extremity Assessment Lower Extremity Assessment: Generalized weakness;RLE deficits/detail;LLE deficits/detail RLE Deficits / Details: will move R lower to command and against gravity LLE Deficits / Details: does not attempt to move L LE - allows passive stretching    Cervical / Trunk Assessment Cervical / Trunk Assessment: Kyphotic  Communication   Communication: Expressive difficulties(garbled speech)  Cognition Arousal/Alertness: Awake/alert Behavior During Therapy:  WFL for tasks assessed/performed Overall Cognitive Status: Impaired/Different from baseline Area of Impairment: Orientation;Following commands;Safety/judgement;Problem solving                 Orientation Level: Disoriented to;Situation;Place     Following Commands: Follows one step commands inconsistently;Follows one step commands with increased time Safety/Judgement: Decreased awareness of safety;Decreased awareness of deficits   Problem Solving:  Slow processing;Requires verbal cues;Requires tactile cues;Decreased initiation;Difficulty sequencing General Comments: difficulty understanding patients speech at times. poor awareness      General Comments General comments (skin integrity, edema, etc.): VSS on RA - SpO2 100%, HR mid 70's throughout session    Exercises     Assessment/Plan    PT Assessment Patient needs continued PT services  PT Problem List Decreased strength;Decreased activity tolerance;Decreased balance;Decreased mobility;Decreased cognition;Decreased knowledge of use of DME;Decreased safety awareness       PT Treatment Interventions DME instruction;Gait training;Therapeutic activities;Functional mobility training;Therapeutic exercise;Balance training;Neuromuscular re-education;Patient/family education    PT Goals (Current goals can be found in the Care Plan section)  Acute Rehab PT Goals Patient Stated Goal: none stated PT Goal Formulation: With patient/family Time For Goal Achievement: 10/17/18 Potential to Achieve Goals: Fair    Frequency Min 2X/week   Barriers to discharge        Co-evaluation PT/OT/SLP Co-Evaluation/Treatment: Yes Reason for Co-Treatment: Complexity of the patient's impairments (multi-system involvement);Necessary to address cognition/behavior during functional activity;For patient/therapist safety;To address functional/ADL transfers PT goals addressed during session: Mobility/safety with mobility;Balance         AM-PAC PT "6 Clicks" Mobility  Outcome Measure Help needed turning from your back to your side while in a flat bed without using bedrails?: A Lot Help needed moving from lying on your back to sitting on the side of a flat bed without using bedrails?: A Lot Help needed moving to and from a bed to a chair (including a wheelchair)?: Total Help needed standing up from a chair using your arms (e.g., wheelchair or bedside chair)?: Total Help needed to walk in hospital room?:  Total Help needed climbing 3-5 steps with a railing? : Total 6 Click Score: 8    End of Session   Activity Tolerance: Patient tolerated treatment well Patient left: in bed;with call bell/phone within reach;with bed alarm set Nurse Communication: Mobility status PT Visit Diagnosis: Unsteadiness on feet (R26.81);Other abnormalities of gait and mobility (R26.89);Muscle weakness (generalized) (M62.81)    Time: 5929-2446 PT Time Calculation (min) (ACUTE ONLY): 25 min   Charges:   PT Evaluation $PT Eval Moderate Complexity: 1 Mod           Lanney Gins, PT, DPT Supplemental Physical Therapist 10/03/18 3:17 PM Pager: [phone] Office: [phone]

## 2018-10-03 NOTE — Care Management Important Message (Signed)
Important Message  Patient Details  Name: Ronald [name] MRN: [id number] Date of Birth: 1945-04-22   Medicare Important Message Given:  Yes     Due to illness patient not able to sign.  Signed copy left at patient bedside.   Elsie Baynes 10/03/2018, 12:46 PM

## 2018-10-03 NOTE — Progress Notes (Signed)
  Date: 10/03/2018  Patient name: Tayler Lassen  Medical record number: [id number]  Date of birth: Feb 17, 1946   I have seen and evaluated this patient and I have discussed the plan of care with the house staff. Please see their note for complete details. I concur with their findings with the following additions/corrections:   Unable to place NG tube yesterday as he immediately pulled it out.  Appreciate SLP reevaluation today, recommending cautious resumption of dysphagia diet.  He is energetic today, but speech remains quite dysarthric and difficult to understand, so communication is quite difficult.  Neurology transitioned Dilantin to Depakote yesterday.  We will continue to trend his response.  Overall, I am quite concerned about his functional status if he does not have more significant recovery in his ability to maintain his own nutrition.  His sister should be coming in to be with him soon and we can update her further in person.  Lenice Pressman, M.D., Ph.D. 10/03/2018, 3:45 PM

## 2018-10-03 NOTE — Progress Notes (Signed)
Occupational Therapy Evaluation Patient Details Name: Ronald [name] MRN: [id number] DOB: 12-08-1945 Today's Date: 10/03/2018    History of Present Illness Patient is a 72 y/o male presenting to the ED on 09/29/2018 with priamry complaints of Tonic clonic seizure secondary to stroke. CT showed R parietal lobe hypodensity consistent with a subacute stroke. Of note, recent hospital discharge on 09/14/2018 for L MCA stroke for which he received tPA and angioplasty of the R terminal ICA. Past medical history of prostate cancer, dementia, heart disease, hyperlipidemia, HTN, measles, mumps, schizophrenia, multiple TIA, stroke.   Clinical Impression   PTA, pt was residing at a SNF, unsure pt's PLOF with ADL/IADL and functional mobiltiy. Pt currently requires maxA-totalA for ADL. Pt requires maxA to maintain upright balance sitting EOB. VSS throughout. Pt with decreased attention to visual stimulus, noted nystagmus in bilateral eyes with end range occular range of motion. Due to decline in current level of function, pt would benefit from acute OT to address established goals to facilitate safe D/C to venue listed below. At this time, recommend SNF follow-up. Will continue to follow acutely.     Follow Up Recommendations  SNF;Supervision/Assistance - 24 hour    Equipment Recommendations  Other (comment)(defer to next venue)    Recommendations for Other Services       Precautions / Restrictions Precautions Precautions: Fall Restrictions Weight Bearing Restrictions: No      Mobility Bed Mobility Overal bed mobility: Needs Assistance Bed Mobility: Supine to Sit;Sit to Supine     Supine to sit: Max assist;Total assist;+2 for physical assistance Sit to supine: Total assist;+2 for physical assistance   General bed mobility comments: Max/Total A +2 for all bed level mobility; once sitting EOB patient using R UE to hold onto footboard for upright balance  Transfers                  General transfer comment: deferred    Balance Overall balance assessment: Needs assistance Sitting-balance support: Single extremity supported;Feet supported Sitting balance-Leahy Scale: Poor Sitting balance - Comments: varying levels of assist needed - up to Max A for upright balance                                   ADL either performed or assessed with clinical judgement   ADL Overall ADL's : Needs assistance/impaired   Eating/Feeding Details (indicate cue type and reason): NPO Grooming: Total assistance Grooming Details (indicate cue type and reason): pt unable to use LUE functionally;required totalA to wash face due to mitten restraint Upper Body Bathing: Maximal assistance;Sitting   Lower Body Bathing: Total assistance   Upper Body Dressing : Maximal assistance   Lower Body Dressing: Total assistance                 General ADL Comments: deferred mobiltiy due to safety;pt required modA-maxA to maintain upright posture     Vision Baseline Vision/History: Wears glasses Wears Glasses: Reading only Patient Visual Report: (per chart) Vision Assessment?: Yes;Vision impaired- to be further tested in functional context Ocular Range of Motion: Impaired-to be further tested in functional context Alignment/Gaze Preference: Other (comment)(preference for looking right over other directions) Tracking/Visual Pursuits: Impaired - to be further tested in functional context Visual Fields: Impaired-to be further tested in functional context Additional Comments: noted preference for R gaze;noted nystagmus with occular end range of motion in bilat eyes;decreased attention to visual stimulus for thorough exam  Perception     Praxis      Pertinent Vitals/Pain Pain Assessment: Faces Faces Pain Scale: No hurt Pain Intervention(s): Monitored during session     Hand Dominance Right   Extremity/Trunk Assessment Upper Extremity Assessment Upper Extremity  Assessment: LUE deficits/detail LUE Deficits / Details: Poor ROM and strength. Able to perform composite flexion of left digits with a compensatory extension of wrist. No extension of digits.unable to use functionally LUE Coordination: decreased fine motor;decreased gross motor   Lower Extremity Assessment Lower Extremity Assessment: Defer to PT evaluation RLE Deficits / Details: will move R lower to command and against gravity LLE Deficits / Details: does not attempt to move L LE - allows passive stretching   Cervical / Trunk Assessment Cervical / Trunk Assessment: Kyphotic   Communication Communication Communication: Expressive difficulties(garbled speech)   Cognition Arousal/Alertness: Awake/alert Behavior During Therapy: WFL for tasks assessed/performed Overall Cognitive Status: Impaired/Different from baseline Area of Impairment: Orientation;Following commands;Safety/judgement;Problem solving                 Orientation Level: Disoriented to;Situation;Place Current Attention Level: Sustained Memory: Decreased short-term memory Following Commands: Follows one step commands inconsistently;Follows one step commands with increased time Safety/Judgement: Decreased awareness of safety;Decreased awareness of deficits Awareness: Intellectual Problem Solving: Slow processing;Requires verbal cues;Requires tactile cues;Decreased initiation;Difficulty sequencing General Comments: difficulty understanding patients speech at times. poor awareness   General Comments  VSS on RA; SpO2 100% HR mid 70s throuhgout session    Exercises     Shoulder Instructions  Upper Extremity Assessment     Poor ROM and strength. Able to perform composite flexion of left digits with a compensatory extension of wrist. No extension of digits.unable to use functionally    Home Living Family/patient expects to be discharged to:: Skilled nursing facility                                         Prior Functioning/Environment Level of Independence: Needs assistance        Comments: resident of SNF prior to admission - unsure of PLOF        OT Problem List: Decreased strength;Decreased range of motion;Decreased activity tolerance;Impaired balance (sitting and/or standing);Decreased knowledge of use of DME or AE;Decreased knowledge of precautions;Decreased cognition;Decreased coordination;Impaired UE functional use;Pain      OT Treatment/Interventions: Self-care/ADL training;Therapeutic exercise;Energy conservation;DME and/or AE instruction;Therapeutic activities;Patient/family education    OT Goals(Current goals can be found in the care plan section) Acute Rehab OT Goals Patient Stated Goal: none stated OT Goal Formulation: With patient Time For Goal Achievement: 10/17/18 Potential to Achieve Goals: Good ADL Goals Pt Will Perform Upper Body Dressing: with min assist Pt Will Perform Lower Body Dressing: with min assist Pt Will Transfer to Toilet: with mod assist Additional ADL Goal #1: Pt will sustain attention to simple ADL with min cues. Additional ADL Goal #2: Pt will sit EOB independently to complete ADL.  OT Frequency: Min 2X/week   Barriers to D/C:            Co-evaluation PT/OT/SLP Co-Evaluation/Treatment: Yes Reason for Co-Treatment: Complexity of the patient's impairments (multi-system involvement);Necessary to address cognition/behavior during functional activity;For patient/therapist safety;To address functional/ADL transfers PT goals addressed during session: Mobility/safety with mobility;Balance OT goals addressed during session: ADL's and self-care      AM-PAC OT "6 Clicks" Daily Activity     Outcome Measure Help from another person  eating meals?: Total Help from another person taking care of personal grooming?: A Lot Help from another person toileting, which includes using toliet, bedpan, or urinal?: Total Help from another person bathing  (including washing, rinsing, drying)?: A Lot Help from another person to put on and taking off regular upper body clothing?: A Lot Help from another person to put on and taking off regular lower body clothing?: Total 6 Click Score: 9   End of Session Nurse Communication: Mobility status  Activity Tolerance: Patient tolerated treatment well Patient left: with call bell/phone within reach;in bed;with bed alarm set  OT Visit Diagnosis: Unsteadiness on feet (R26.81);Other abnormalities of gait and mobility (R26.89);Muscle weakness (generalized) (M62.81);Other symptoms and signs involving cognitive function;Hemiplegia and hemiparesis;Feeding difficulties (R63.3) Hemiplegia - Right/Left: Left Hemiplegia - dominant/non-dominant: Non-Dominant Hemiplegia - caused by: Cerebral infarction                Time: 0802-2336 OT Time Calculation (min): 28 min Charges:  OT General Charges $OT Visit: 1 Visit OT Evaluation $OT Eval Moderate Complexity: [place] OTR/L Acute Rehabilitation Services Office: [phone]   Wyn Forster 10/03/2018, 3:54 PM

## 2018-10-03 NOTE — Progress Notes (Signed)
D/C LTM video EEG. Mild skin breakdown at [place] is aware recommend rubbing neosporin on that site.

## 2018-10-03 NOTE — Progress Notes (Signed)
Subjective: Much more awake today.   I discussed with his sister, and apparently he has had problems with confusion even at baseline.  He has a history of dementia and schizophrenia.  He states that he has had some degree of twitching for quite a long time, possibly since his last stroke, but I am not certain of the reliability of this.  Exam: Vitals:   10/02/18 2000 10/03/18 0700  BP: (!) 155/69 (!) 155/80  Pulse: 69 78  Resp:  18  Temp: 98.1 F (36.7 C) 97.8 F (36.6 C)  SpO2: 96% 100%   Gen: In bed, NAD Resp: non-labored breathing, no acute distress Abd: soft, nt  Neuro: MS: Awake, alert, oriented to month. Very dysarthric and agitated, refuses to answer some orientation questions, unclear if due to agitation or confusion.  CN: Pupils equal and reactive to light, visual fields are full, EOMI. Motor: He has good strength on his right side, on his left he has 3/5 strength left lower extremity, 0/5 distally and 2/5 proximally in the left upper extremity.  He has persistent once per second twitch of his left hand. 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 With his underlying dementia and schizophrenia it is difficult to fully assess whether he is having mental status changes associated with it.  He is, however, able to have a coherent conversation and discuss things regarding his nursing home, etc.  Recommendations: 1) continue Depakote, Vimpat, Keppra, I suspect that he is a relatively slow metabolizer and will decrease the Vimpat to 150 twice daily, also Keppra is at a very high dose at 2 g twice daily and I will decrease this to 1500 twice daily. 2) additional 1 g of IV Depakote, repeat level tomorrow 3) continue holding Dilantin as it continues to be supratherapeutic.  Roland Rack, MD [facility] [phone]  If 7pm- 7am, please page neurology on call as  listed in [place].

## 2018-10-04 DIAGNOSIS — Z960 Presence of urogenital implants: Secondary | ICD-10-CM

## 2018-10-04 LAB — GLUCOSE, CAPILLARY
Glucose-Capillary: 121 mg/dL — ABNORMAL HIGH (ref 70–99)
Glucose-Capillary: 86 mg/dL (ref 70–99)
Glucose-Capillary: 100 mg/dL — ABNORMAL HIGH (ref 70–99)
Glucose-Capillary: 92 mg/dL (ref 70–99)
Glucose-Capillary: 90 mg/dL (ref 70–99)

## 2018-10-04 LAB — VALPROIC ACID LEVEL: Valproic Acid Lvl: 54 ug/mL (ref 50.0–100.0)

## 2018-10-04 LAB — CBC
HCT: 38.7 % — ABNORMAL LOW (ref 39.0–52.0)
Hemoglobin: 12.4 g/dL — ABNORMAL LOW (ref 13.0–17.0)
MCH: 29 pg (ref 26.0–34.0)
MCHC: 32 g/dL (ref 30.0–36.0)
MCV: 90.4 fL (ref 80.0–100.0)
Platelets: 223 10*3/uL (ref 150–400)
RBC: 4.28 MIL/uL (ref 4.22–5.81)
RDW: 14 % (ref 11.5–15.5)
WBC: 6.6 10*3/uL (ref 4.0–10.5)
nRBC: 0 % (ref 0.0–0.2)

## 2018-10-04 LAB — BASIC METABOLIC PANEL
Anion gap: 10 (ref 5–15)
BUN: 12 mg/dL (ref 8–23)
CO2: 25 mmol/L (ref 22–32)
Calcium: 9 mg/dL (ref 8.9–10.3)
Chloride: 106 mmol/L (ref 98–111)
Creatinine, Ser: 0.77 mg/dL (ref 0.61–1.24)
GFR calc Af Amer: >60 mL/min (ref >60)
GFR calc non Af Amer: >60 mL/min (ref >60)
Glucose, Bld: 108 mg/dL — ABNORMAL HIGH (ref 70–99)
Potassium: 3.4 mmol/L — ABNORMAL LOW (ref 3.5–5.1)
Sodium: 141 mmol/L (ref 135–145)

## 2018-10-04 LAB — PHENYTOIN LEVEL, TOTAL: Phenytoin Lvl: 14.8 ug/mL (ref 10.0–20.0)

## 2018-10-04 MED ORDER — LORAZEPAM 1 MG PO TABS
1.0000 mg | ORAL_TABLET | Freq: Three times a day (TID) | ORAL | Status: DC
  Administered 2018-10-04 – 2018-10-05 (×3): 1 mg via ORAL
  Filled 2018-10-04 (×3): qty 1

## 2018-10-04 NOTE — Progress Notes (Signed)
Subjective: Mr. Stenzel was seen on rounds today. Resting in bed  With soft mittens. He intermittently tries to pick up urinary catheter. Pt is saying something about his L arm but is too dysarthric to understand.   Objective:  Vital signs in last 24 hours: Vitals:   10/04/18 0524 10/04/18 0828 10/04/18 0830 10/04/18 1302  BP:  (!) 171/75 (!) 156/69 (!) 143/69  Pulse:    79  Resp:    17  Temp:  98.4 F (36.9 C)  98.8 F (37.1 C)  TempSrc:  Oral    SpO2: 98% 98% 98%   Weight:      Height:       Physical Exam Constitutional:      General: He is not in acute distress.    Appearance: He is not toxic-appearing.  HENT:     Head: Normocephalic and atraumatic.  Eyes:     General: No scleral icterus.       Right eye: No discharge.        Left eye: No discharge.     Extraocular Movements: Extraocular movements intact.     Pupils: Pupils are equal, round, and reactive to light.  Cardiovascular:     Rate and Rhythm: Normal rate and regular rhythm.     Pulses: Normal pulses.     Heart sounds: Normal heart sounds. No murmur. No friction rub. No gallop.   Pulmonary:     Effort: Pulmonary effort is normal. No respiratory distress.     Breath sounds: Normal breath sounds. No wheezing or rales.  Abdominal:     General: Bowel sounds are normal. There is no distension.     Palpations: Abdomen is soft.     Tenderness: There is no abdominal tenderness. There is no guarding.  Genitourinary:    Comments: Condom catheter in place  Musculoskeletal:        General: No swelling, tenderness, deformity or signs of injury.     Comments: LUE low amplitude rhythmic twitches are present. Per patient is not new  LUE strength 1/5 RUE strength 5/5  Skin:    General: Skin is warm and dry.     Coloration: Skin is not jaundiced.  Neurological:     Mental Status: He is alert. Mental status is at baseline.     Assessment/Plan:  Principal Problem:   Seizure ([place]) Active Problems:  Seizure-like activity ([place])   Dysphagia  In summary, Mr. Mancillas is a 73 y.o male with a PMHx significant for prostate cancer, dementia, heart disease, hyperlipidemia, HTN, measles, mumps, schizophrenia, multiple TIA, and stroke who presented today with tonic clonic seizures, suspected to besecondary to a R sided subacute strokein mid Juneconfirmed by head imaging. CT head showed no acute bleedsand MRI showed subacute stroke with no new findings. The patient's sister received clearance to be in the hospital. Will have discussion with her when able.  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 The first siezure at the nursing home was resolved with 5mg  of Versed once EMS arrived. The patient seized again in the ED where 5mg totalof Ativan and 2000 mgofIV Keppra were administered. Subsequent EEG monitoring did not reveal seizure like activity.neurologic exam has remained unchanged. Of note, the patient is having involuntary LUE low amplitude jerks. Neurology evaluated and recommendations are as follows: - Keppraincreased to 1500mg  BID -Vimpat 150mg  BID - Depakote 750mg  BID - Ativan PRN  #  Hypokalemia:K 3.4 on 7/4. Mg WNL.  - Repleting PO with 69mEq BID - BMP every other day, as labs have been stable.  #R parietal subacute stroke: The findings on CT head and MRI consistent with previousRMCA stroke w/ L sided deficits that are unchanged.  - Atorvastatin 81 mg daily -Plavix 74mg  daily - Lovenox 40mg  injection daily - Aspirin 325 mg daily  #HTN -Metoprolol50mg  PO BID - Lisinopril 10mg  PO daily  #FENGI - Aspirtation risk, SLP recs Dysphagia 1(puree); honey-thick liquids via teaspoon.   Dispo: Anticipated discharge dependent on SNF placement. Per SW, [facility] does not have a bed. Will continue to look for SNF  [name], MD 10/04/2018, 3:13 PM Pager: [phone]

## 2018-10-04 NOTE — Progress Notes (Signed)
RN attempted to call family member and give them an update on the patients status, but no one answered the phone. RN to continue to monitor.

## 2018-10-04 NOTE — Progress Notes (Signed)
  Speech Language Pathology Treatment: Dysphagia  Patient Details Name: Ronald [name] MRN: [id number] DOB: 1945/06/25 Today's Date: 10/04/2018 Time: 1240-1300 SLP Time Calculation (min) (ACUTE ONLY): 20 min  Assessment / Plan / Recommendation Clinical Impression  Pt awakened easily, attentive to PO, still moderately verbally agitated. Verbal cues needed for pt to stay silent while manipulating PO. One strong coughing episode occurred during pudding intake. Likely due to talking with oral/pharyngeal residue. Rate slowed, bolus size decreased with further tolerance. Continue current diet.   HPI HPI: Ronald [name] is a 73 year old man with PMH of treated prostate cancer, heart disease, HLD, HTN, schizophrenia, multiple TIA, stroke (6/4 most recent), dementia.  He was found to be seizing in his SNF today and was sent to the ED, when we saw him he was somnolent, likely due to a mix of post ictal state and medications.  GCS 6. Abnormal EEG 7/4.  MRI 7/3: "Expected evolutionary changes of the late subacute infarction in the right MCA territory. Developing volume loss and gliosis. No evidence of any additional extension or new insult. Older infarction in the right parietal region as seen previously."      SLP Plan  Continue with current plan of care       Recommendations  Diet recommendations: Dysphagia 1 (puree);Honey-thick liquid Liquids provided via: Teaspoon Medication Administration: Crushed with puree Supervision: Patient able to self feed Compensations: Slow rate;Small sips/bites;Follow solids with liquid Postural Changes and/or Swallow Maneuvers: Seated upright 90 degrees                Oral Care Recommendations: Oral care BID Follow up Recommendations: Skilled Nursing facility SLP Visit Diagnosis: Dysphagia, oropharyngeal phase (R13.12) Plan: Continue with current plan of care       GO               Herbie Baltimore, MA [place] Pager  [phone] Office [phone]  Lynann Beaver 10/04/2018, 1:21 PM

## 2018-10-04 NOTE — Progress Notes (Signed)
Patient sister, Ronald [name], called and was updated twice tonight.  He is in bed resting with eyes closed.  Will continue to monitor.

## 2018-10-04 NOTE — Progress Notes (Signed)
Pt too somnolent to take his scheduled Metoprolol. Dr paged and stated it was ok.

## 2018-10-04 NOTE — Progress Notes (Signed)
  Date: 10/04/2018  Patient name: Ronald [name]  Medical record number: [id number]  Date of birth: 1946-01-12   I have seen and evaluated this patient and I have discussed the plan of care with the house staff. Please see their note for complete details. I concur with their findings.  Lenice Pressman, M.D., Ph.D. 10/04/2018, 7:44 PM

## 2018-10-04 NOTE — Progress Notes (Signed)
Subjective: He continues to be awake. Continues to be consistent in his report that he has had some degree of twitching in the left hand for months.   Exam: Vitals:   10/04/18 0830 10/04/18 1302  BP: (!) 156/69 (!) 143/69  Pulse:  79  Resp:  17  Temp:  98.8 F (37.1 C)  SpO2: 98%    Gen: In bed, NAD Resp: non-labored breathing, no acute distress Abd: soft, nt  Neuro: MS: Awake, alert, oriented to month, year. Very dysarthric, and slightly antagonistic CN: Pupils equal and reactive to light, visual fields are full, EOMI. Motor: He has good strength on his right side, on his left he has 3/5 strength left lower extremity, 2/5 distally and 4-/5 proximally in the left upper extremity.  He has persistent once per second twitch of his left hand. 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 With his underlying dementia and schizophrenia it is difficult to fully assess whether he is having mental status changes associated with it, but per his sister, he was able to have good conversation with her, just very dysarthric.   Recommendations: 1) keppra 1500 mg BID 2) Vimpat 150mg  BID 3) increase depakote to 750mg  BID. Recheck level tomorrow.  4) would favor not restarting dilantin as this interacts with depakote.  5) will continue to follow.   Roland Rack, MD [facility] [phone]  If 7pm- 7am, please page neurology on call as listed in [place].

## 2018-10-05 DIAGNOSIS — I69322 Dysarthria following cerebral infarction: Secondary | ICD-10-CM

## 2018-10-05 LAB — CBC
HCT: 39.8 % (ref 39.0–52.0)
Hemoglobin: 12.6 g/dL — ABNORMAL LOW (ref 13.0–17.0)
MCH: 29 pg (ref 26.0–34.0)
MCHC: 31.7 g/dL (ref 30.0–36.0)
MCV: 91.5 fL (ref 80.0–100.0)
Platelets: 232 10*3/uL (ref 150–400)
RBC: 4.35 MIL/uL (ref 4.22–5.81)
RDW: 13.8 % (ref 11.5–15.5)
WBC: 5.5 10*3/uL (ref 4.0–10.5)
nRBC: 0 % (ref 0.0–0.2)

## 2018-10-05 LAB — BASIC METABOLIC PANEL
Anion gap: 9 (ref 5–15)
BUN: 5 mg/dL — ABNORMAL LOW (ref 8–23)
CO2: 27 mmol/L (ref 22–32)
Calcium: 8.8 mg/dL — ABNORMAL LOW (ref 8.9–10.3)
Chloride: 105 mmol/L (ref 98–111)
Creatinine, Ser: 0.6 mg/dL — ABNORMAL LOW (ref 0.61–1.24)
GFR calc Af Amer: >60 mL/min (ref >60)
GFR calc non Af Amer: >60 mL/min (ref >60)
Glucose, Bld: 137 mg/dL — ABNORMAL HIGH (ref 70–99)
Potassium: 3.4 mmol/L — ABNORMAL LOW (ref 3.5–5.1)
Sodium: 141 mmol/L (ref 135–145)

## 2018-10-05 LAB — MAGNESIUM: Magnesium: 1.6 mg/dL — ABNORMAL LOW (ref 1.7–2.4)

## 2018-10-05 LAB — GLUCOSE, CAPILLARY
Glucose-Capillary: 106 mg/dL — ABNORMAL HIGH (ref 70–99)
Glucose-Capillary: 124 mg/dL — ABNORMAL HIGH (ref 70–99)
Glucose-Capillary: 72 mg/dL (ref 70–99)
Glucose-Capillary: 77 mg/dL (ref 70–99)
Glucose-Capillary: 93 mg/dL (ref 70–99)
Glucose-Capillary: 95 mg/dL (ref 70–99)

## 2018-10-05 LAB — VALPROIC ACID LEVEL: Valproic Acid Lvl: 37 ug/mL — ABNORMAL LOW (ref 50.0–100.0)

## 2018-10-05 MED ORDER — POTASSIUM CHLORIDE CRYS ER 20 MEQ PO TBCR
40.0000 meq | EXTENDED_RELEASE_TABLET | Freq: Once | ORAL | Status: AC
  Administered 2018-10-05: 40 meq via ORAL
  Filled 2018-10-05: qty 2

## 2018-10-05 MED ORDER — MAGNESIUM SULFATE 2 GM/50ML IV SOLN
2.0000 g | Freq: Once | INTRAVENOUS | Status: AC
  Administered 2018-10-05: 2 g via INTRAVENOUS
  Filled 2018-10-05: qty 50

## 2018-10-05 MED ORDER — SODIUM CHLORIDE 0.9 % IV SOLN
INTRAVENOUS | Status: DC
  Administered 2018-10-05 – 2018-10-14 (×15): via INTRAVENOUS

## 2018-10-05 MED ORDER — VALPROATE SODIUM 500 MG/5ML IV SOLN
500.0000 mg | Freq: Once | INTRAVENOUS | Status: AC
  Administered 2018-10-06: 500 mg via INTRAVENOUS
  Filled 2018-10-05: qty 5

## 2018-10-05 MED ORDER — VALPROATE SODIUM 500 MG/5ML IV SOLN
750.0000 mg | Freq: Two times a day (BID) | INTRAVENOUS | Status: DC
  Administered 2018-10-05 (×2): 750 mg via INTRAVENOUS
  Filled 2018-10-05 (×4): qty 7.5

## 2018-10-05 NOTE — Progress Notes (Signed)
RN spoke with the pt's sister, and gave her an update on the pt's status of care. RN to continue to  monitor.

## 2018-10-05 NOTE — Progress Notes (Addendum)
Subjective: Pt is attentive, but hard to understand. It is clear when he says he is sleepy.  He is told we will get a communication board for him and he said we could listen more carefully. He says he has no pain.  Objective:  Vital signs in last 24 hours: Vitals:   10/04/18 2028 10/05/18 0013 10/05/18 0439 10/05/18 0828  BP: (!) 152/68 (!) 165/68 (!) 174/74 (!) 168/85  Pulse: 82 64 75 77  Resp: 16 16 16 18   Temp: 97.6 F (36.4 C) 97.7 F (36.5 C) 97.9 F (36.6 C) 98 F (36.7 C)  TempSrc:      SpO2: 100% 100% 100%   Weight:      Height:       Physical Exam Constitutional:      General: He is not in acute distress.    Appearance: He is not toxic-appearing.  HENT:     Head: Normocephalic and atraumatic.     Mouth/Throat:     Mouth: Mucous membranes are moist.  Eyes:     Extraocular Movements: Extraocular movements intact.  Cardiovascular:     Rate and Rhythm: Normal rate and regular rhythm.     Pulses: Normal pulses.     Heart sounds: No murmur. No friction rub. No gallop.   Pulmonary:     Effort: Pulmonary effort is normal. No respiratory distress.     Breath sounds: Normal breath sounds. No wheezing or rales.  Abdominal:     General: Abdomen is flat. There is no distension.     Palpations: Abdomen is soft.     Tenderness: There is no abdominal tenderness. There is no guarding.  Musculoskeletal:     Right lower leg: No edema.     Left lower leg: No edema.     Comments: LUE small rhythmic contractions of muscles  Skin:    General: Skin is warm and dry.  Neurological:     Mental Status: He is alert. Mental status is at baseline.     Motor: Weakness (L sided) present.     Comments: Unable to access orientation due to severe dysarthria   Psychiatric:        Mood and Affect: Mood normal.    Assessment/Plan:  Principal Problem:   Seizure ([place]) Active Problems:   Seizure-like activity ([place])   Dysphagia  In summary, [name] is a 73 y.o male with a PMHx  significant for prostate cancer, dementia, heart disease, hyperlipidemia, HTN, measles, mumps, schizophrenia, multiple TIA, and stroke who presented today with tonic clonic seizures, suspected to besecondary to a R sided subacute strokein mid Juneconfirmed byhead imaging.CT headshowed no acute bleedsand MRIshowed subacute stroke with no new findings. The patient's sister received clearance to be in the hospital. Will have discussion with her when able.  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 The first siezure at the nursing home was resolved with 5mg  of Versed once EMS arrived. The patient seized again in the ED where 5mg totalof Ativan and 2000 mgofIV Keppra were administered. Subsequent EEG monitoring did not reveal seizure like activity.neurologic exam has remained unchanged. Of note, the patient is having involuntary LUE low amplitude jerks. Neurology evaluated and thinks the pt is having EPC. Recommendations are as follows: - Keppraincreased to 1500mg  BID -Vimpat 150mg  BID - Depakote 750mg  BID, recheck level today - Ativan PRN  #FEN/GI #Hypokalemia #Hypomagnemesia:K 3.4 on 7/4, 3.4  today. Mg 1.6 .  - Repleting K+ PO with 103mEq BID, Mg IV - BMPevery other day, as labs have been stable. - Strict I/O  #R parietal subacute stroke #Dysarthria The findings on CT headand MRIconsistent with previousRMCA stroke w/ L sided deficits that are unchanged.  - Atorvastatin 81 mg daily -Plavix 74mg  daily - Lovenox 40mg  injection daily - Aspirin 325 mg daily - Nurse looking into getting patient a board to improve communication  -d/c Tele  #HTN -Metoprolol50mg  PO BID - Lisinopril 10mg  PO daily  #FENGI - Aspirtation risk,SLP recs dysphagia 1(puree); honey-thick liquids via teaspoon.   Dispo: Anticipated  dischargedependent on SNF placement. Per SW, [facility] does not have a bed. Will continue to look for SNF  [name], MD Internal Medicine, PGY1 Pager: [phone]  10/05/2018,9:54 AM

## 2018-10-05 NOTE — Progress Notes (Signed)
Physical Therapy Treatment Patient Details Name: Ronald [name] MRN: [id number] DOB: 07/11/1945 Today's Date: 10/05/2018    History of Present Illness Patient is a 73 y/o male presenting to the ED on 09/29/2018 with priamry complaints of Tonic clonic seizure secondary to stroke. CT showed R parietal lobe hypodensity consistent with a subacute stroke. Of note, recent hospital discharge on 09/14/2018 for L MCA stroke for which he received tPA and angioplasty of the R terminal ICA. Past medical history of prostate cancer, dementia, heart disease, hyperlipidemia, HTN, measles, mumps, schizophrenia, multiple TIA, stroke.    PT Comments    Patient with minimal progress this visit. Able to follow cues and commands. profoud L sided weakness limiting mobility. Cont to rec SNF.   Follow Up Recommendations  SNF;Supervision/Assistance - 24 hour     Equipment Recommendations  Other (comment)(defer)    Recommendations for Other Services       Precautions / Restrictions Precautions Precautions: Fall Restrictions Weight Bearing Restrictions: No    Mobility  Bed Mobility Overal bed mobility: Needs Assistance Bed Mobility: Supine to Sit;Sit to Supine     Supine to sit: Max assist;Total assist;+2 for physical assistance Sit to supine: Total assist;+2 for physical assistance   General bed mobility comments: Max/Total A +2 for all bed level mobility; once sitting EOB patient using R UE to hold onto footboard for upright balance  Transfers                 General transfer comment: deferred  Ambulation/Gait                 Stairs             Wheelchair Mobility    Modified Rankin (Stroke Patients Only) Modified Rankin (Stroke Patients Only) Pre-Morbid Rankin Score: Moderately severe disability Modified Rankin: Severe disability     Balance Overall balance assessment: Needs assistance Sitting-balance support: Single extremity supported;Feet supported Sitting  balance-Leahy Scale: Poor Sitting balance - Comments: varying levels of assist needed - up to Max A for upright balance                                    Cognition Arousal/Alertness: Awake/alert Behavior During Therapy: WFL for tasks assessed/performed Overall Cognitive Status: Impaired/Different from baseline Area of Impairment: Orientation;Following commands;Safety/judgement;Problem solving                 Orientation Level: Disoriented to;Situation;Place     Following Commands: Follows one step commands inconsistently;Follows one step commands with increased time Safety/Judgement: Decreased awareness of safety;Decreased awareness of deficits   Problem Solving: Slow processing;Requires verbal cues;Requires tactile cues;Decreased initiation;Difficulty sequencing General Comments: difficulty understanding patients speech at times. poor awareness      Exercises      General Comments        Pertinent Vitals/Pain Faces Pain Scale: No hurt    Home Living                      Prior Function            PT Goals (current goals can now be found in the care plan section) Acute Rehab PT Goals Patient Stated Goal: none stated PT Goal Formulation: With patient/family Time For Goal Achievement: 10/17/18 Potential to Achieve Goals: Fair Progress towards PT goals: Progressing toward goals    Frequency    Min 2X/week  PT Plan      Co-evaluation PT/OT/SLP Co-Evaluation/Treatment: Yes            AM-PAC PT "6 Clicks" Mobility   Outcome Measure  Help needed turning from your back to your side while in a flat bed without using bedrails?: A Lot Help needed moving from lying on your back to sitting on the side of a flat bed without using bedrails?: A Lot Help needed moving to and from a bed to a chair (including a wheelchair)?: Total Help needed standing up from a chair using your arms (e.g., wheelchair or bedside chair)?: Total Help  needed to walk in hospital room?: Total Help needed climbing 3-5 steps with a railing? : Total 6 Click Score: 8    End of Session   Activity Tolerance: Patient tolerated treatment well Patient left: in bed;with call bell/phone within reach;with bed alarm set Nurse Communication: Mobility status PT Visit Diagnosis: Unsteadiness on feet (R26.81);Other abnormalities of gait and mobility (R26.89);Muscle weakness (generalized) (M62.81)     Time: 1300-1320 PT Time Calculation (min) (ACUTE ONLY): 20 min  Charges:  $Therapeutic Activity: 8-22 mins                     Reinaldo Berber, PT, DPT Acute Rehabilitation Services Pager: [phone] Office: [phone]     Reinaldo Berber 10/05/2018, 2:49 PM

## 2018-10-05 NOTE — Plan of Care (Signed)
  Problem: Medication: Goal: Risk for medication side effects will decrease Outcome: Progressing   Problem: Clinical Measurements: Goal: Complications related to the disease process, condition or treatment will be avoided or minimized Outcome: Progressing Goal: Diagnostic test results will improve Outcome: Progressing   Problem: Education: Goal: Knowledge of General Education information will improve Description: Including pain rating scale, medication(s)/side effects and non-pharmacologic comfort measures Outcome: Progressing   Problem: Health Behavior/Discharge Planning: Goal: Ability to manage health-related needs will improve Outcome: Progressing

## 2018-10-05 NOTE — Progress Notes (Signed)
Pt noted to be lethargic, and arouses to voices. Scheduled dosage of ativan held due to patients status. MD made aware. RN to continue to monitor.

## 2018-10-05 NOTE — Progress Notes (Signed)
  Date: 10/05/2018  Patient name: Ronald [name]  Medical record number: [id number]  Date of birth: October 26, 1945   I have seen and evaluated this patient and I have discussed the plan of care with the house staff. Please see their note for complete details. I concur with their findings with the following additions/corrections:   Stable since yesterday, still having right arm twitching. Appreciate ongoing neurology consult for AED management. Speech remains very dysarthric, but he has a strong desire to communicate and gets frustrated with our lack of understanding. I think he may benefit from a communication board.  Lenice Pressman, M.D., Ph.D. 10/05/2018, 2:14 PM

## 2018-10-05 NOTE — Progress Notes (Signed)
Subjective: Significantly more dysarthric today  Exam: Vitals:   10/05/18 1457 10/05/18 1629  BP: 121/68 (!) 153/78  Pulse: (!) 59 72  Resp: 17 18  Temp: 98.3 F (36.8 C) 98.2 F (36.8 C)  SpO2: 100%    Gen: In bed, NAD Resp: non-labored breathing, no acute distress Abd: soft, nt  Neuro: MS: Awake, alert, very difficult to understand very dysarthric, and slightly antagonistic CN: Pupils equal and reactive to light, visual fields are full, EOMI. his left facial droop is significantly more pronounced today. Motor: He has good strength on his right side, on his left he has 3/5 strength left lower extremity, 1/5 distally and 1-/5 proximally in the left upper extremity.  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 With his underlying dementia and schizophrenia it is difficult to fully assess whether he is having mental status changes associated with it, but per his sister, he was able to have good conversation with her, just very dysarthric.   I suspect that his worsening of his left-sided weakness and dysarthria is likely related to his benzodiazepine.  We may have made some progress as the twitches appear less prominent, but they are still present.  I do not think I would favor continuing benzodiazepine therapy.  Recommendations: 1) keppra 1500 mg BID 2) Vimpat 150mg  BID 3) give additional dose of 500 of IV Depakote, dose increased to 750 twice daily, recheck level tomorrow morning 5) will continue to follow.   Roland Rack, MD [facility] [phone]  If 7pm- 7am, please page neurology on call as listed in [place].

## 2018-10-05 NOTE — TOC Progression Note (Signed)
Transition of Care [facility]) - Progression Note    Patient Details  Name: Ronald [name] MRN: [id number] Date of Birth: 20-May-1945  Transition of Care [facility]) CM/SW [place], LCSW Phone Number: 10/05/2018, 8:51 AM  Clinical Narrative:    Per Tarri Glenn, they are unable to accept patient back due to him only having 5 paid SNF days left and requiring long term care. CSW expanded SNF search, but no availability as of yet.    Expected Discharge Plan: [place] Barriers to Discharge: Continued Medical Work up  Expected Discharge Plan and Services Expected Discharge Plan: [place] Choice: [place] arrangements for the past 2 months: Single Family Home, [place]                                       Social Determinants of Health (SDOH) Interventions    Readmission Risk Interventions No flowsheet data found.

## 2018-10-05 NOTE — Consult Note (Signed)
Pharmacy student rounding with the IMTS/B2/Lane service was asked to consult on the timing of Depakote level for the patient following a dose increase as recommended by the Neurology service.  I have evaluated his baseline laboratory values as documented in his history and physical exam.  Based on the information available to me via [employer] and the package insert, Depakote levels should be drawn before the morning dose (trough level). The evidence behind the timing of this level is due to the relatively short half-life of Depakote once steady state is achieved. The patient is already at steady state with his Depakote but he is subtherapeutic currently. It is reasonable to draw the level to confirm efficacy after 3 doses have been administered prior to the morning dose. It should be noted that based on the 30-September-2018 labs, patient had slightly low albumin of which can affect the Depakote level. This is due to the highly protein bound nature of Depakote (90-95%). It would be reasonable to consider redrawing a CMP to confirm patient's albumin has not decreased further.   [name]

## 2018-10-06 DIAGNOSIS — R39198 Other difficulties with micturition: Secondary | ICD-10-CM

## 2018-10-06 LAB — BASIC METABOLIC PANEL
Anion gap: 9 (ref 5–15)
BUN: 5 mg/dL — ABNORMAL LOW (ref 8–23)
CO2: 24 mmol/L (ref 22–32)
Calcium: 8.3 mg/dL — ABNORMAL LOW (ref 8.9–10.3)
Chloride: 110 mmol/L (ref 98–111)
Creatinine, Ser: 0.56 mg/dL — ABNORMAL LOW (ref 0.61–1.24)
GFR calc Af Amer: >60 mL/min (ref >60)
GFR calc non Af Amer: >60 mL/min (ref >60)
Glucose, Bld: 84 mg/dL (ref 70–99)
Potassium: 3.7 mmol/L (ref 3.5–5.1)
Sodium: 143 mmol/L (ref 135–145)

## 2018-10-06 LAB — GLUCOSE, CAPILLARY
Glucose-Capillary: 104 mg/dL — ABNORMAL HIGH (ref 70–99)
Glucose-Capillary: 147 mg/dL — ABNORMAL HIGH (ref 70–99)
Glucose-Capillary: 58 mg/dL — ABNORMAL LOW (ref 70–99)
Glucose-Capillary: 78 mg/dL (ref 70–99)
Glucose-Capillary: 82 mg/dL (ref 70–99)
Glucose-Capillary: 84 mg/dL (ref 70–99)
Glucose-Capillary: 86 mg/dL (ref 70–99)

## 2018-10-06 LAB — VALPROIC ACID LEVEL: Valproic Acid Lvl: 40 ug/mL — ABNORMAL LOW (ref 50.0–100.0)

## 2018-10-06 LAB — MAGNESIUM: Magnesium: 1.9 mg/dL (ref 1.7–2.4)

## 2018-10-06 MED ORDER — DIVALPROEX SODIUM 250 MG PO DR TAB
1000.0000 mg | DELAYED_RELEASE_TABLET | Freq: Two times a day (BID) | ORAL | Status: DC
  Filled 2018-10-06: qty 4

## 2018-10-06 MED ORDER — DIVALPROEX SODIUM 125 MG PO CSDR
1000.0000 mg | DELAYED_RELEASE_CAPSULE | Freq: Two times a day (BID) | ORAL | Status: DC
  Administered 2018-10-06 – 2018-10-16 (×21): 1000 mg via ORAL
  Filled 2018-10-06 (×20): qty 8

## 2018-10-06 NOTE — Progress Notes (Signed)
Pt's CBG 58. Orange juice provided. CBG later reassessed and note to be 84.Pt asymptomatic, and currently being fed. RN to continue to monitor.

## 2018-10-06 NOTE — Care Management Important Message (Signed)
Important Message  Patient Details  Name: Ronald [name] MRN: [id number] Date of Birth: 03-Sep-1945   Medicare Important Message Given:  Yes     Orbie Pyo 10/06/2018, 2:42 PM

## 2018-10-06 NOTE — Progress Notes (Signed)
  Date: 10/06/2018  Patient name: Ronald [name]  Medical record number: [id number]  Date of birth: December 16, 1945   I have seen and evaluated this patient and I have discussed the plan of care with the house staff. Please see their note for complete details. I concur with their findings with the following additions/corrections:   Much more alert and engaged today, dysarthria improved. Unfortunately, much of his speech was threatening toward members of our team. Hopefully improved energy and speech is a sign of improvement. Per sister at bedside today, he is at his baseline. Likely medically ready for discharge soon pending further titration of AEDs.   Lenice Pressman, M.D., Ph.D. 10/06/2018, 4:26 PM

## 2018-10-06 NOTE — TOC Progression Note (Signed)
Transition of Care [facility]) - Progression Note    Patient Details  Name: Magnus Crescenzo MRN: [id number] Date of Birth: 1945-06-19  [facility]) CM/SW [place], LCSW Phone Number: 10/06/2018, 5:38 PM  Clinical Narrative:    CSW spoke with patient's sister, Florian Buff, to explain lack of SNF bed offers. She reported that patient has Medicaid as well and the SNF would just need to contact DSS to have it converted to facility Medicaid (Case ID# [id number], Raynaldo Opitz). That was her plan for when patient was supposed to move into [facility]. CSW will resend SNF referral with Medicaid info to see if any long term Medicaid beds are available.    Expected Discharge Plan: [place] Barriers to Discharge: Continued Medical Work up  Expected Discharge Plan and Services Expected Discharge Plan: [place] Choice: [place] arrangements for the past 2 months: Single Family Home, [place]                                       Social Determinants of Health (SDOH) Interventions    Readmission Risk Interventions No flowsheet data found.

## 2018-10-06 NOTE — Progress Notes (Signed)
Subjective: Alert and speaking more clear this morning. He reports feeling hungry. Repeatedly mentions he likes his doctor and expresses unprovoked irrigitation by other doctors on the team. He says he has chronic bronchitis. Cooperative on exam. Ask to be slid up in bed.   Objective:  Vital signs in last 24 hours: Vitals:   10/05/18 1457 10/05/18 1629 10/05/18 2007 10/06/18 0429  BP: 121/68 (!) 153/78 (!) 85/74 128/67  Pulse: (!) 59 72 69 63  Resp: 17 18    Temp: 98.3 F (36.8 C) 98.2 F (36.8 C) 98.3 F (36.8 C) 97.6 F (36.4 C)  TempSrc: Oral   Axillary  SpO2: 100%   90%  Weight:      Height:       Physical Exam Vitals signs reviewed.  Constitutional:      General: He is not in acute distress.    Appearance: He is normal weight. He is not ill-appearing or toxic-appearing.  HENT:     Head: Normocephalic and atraumatic.     Mouth/Throat:     Mouth: Mucous membranes are moist.     Pharynx: No oropharyngeal exudate or posterior oropharyngeal erythema.  Eyes:     General: No scleral icterus.       Right eye: No discharge.        Left eye: No discharge.     Extraocular Movements: Extraocular movements intact.  Cardiovascular:     Rate and Rhythm: Normal rate and regular rhythm.     Pulses: Normal pulses.     Heart sounds: Normal heart sounds. No murmur. No friction rub. No gallop.   Pulmonary:     Effort: Pulmonary effort is normal. No respiratory distress.     Breath sounds: Normal breath sounds. No wheezing or rales.  Abdominal:     General: Abdomen is flat. There is no distension.     Palpations: Abdomen is soft.     Tenderness: There is no abdominal tenderness. There is no guarding.  Musculoskeletal:        General: No deformity.     Right lower leg: No edema.     Left lower leg: No edema.     Comments: Spontaneously moves all extremities antigravity upon command  L arm not twitching compared to previous exams   Skin:    General: Skin is warm and dry.   Coloration: Skin is not jaundiced.  Neurological:     Mental Status: He is alert.     Sensory: No sensory deficit.     Deep Tendon Reflexes: Reflexes normal (Hyperreflexia L>R).  Psychiatric:     Comments: Upset and threatens several people in the room.    Assessment/Plan:  Principal Problem:   Seizure ([place]) Active Problems:   Seizure-like activity ([place])   Dysphagia  In summary, Ronald [name] is a 73 y.o male with a PMHx significant for prostate cancer, dementia, heart disease, hyperlipidemia, HTN, measles, mumps, schizophrenia, multiple TIA, and stroke who presented today with tonic clonic seizures, suspected to besecondary to a R sided subacute strokein mid Juneconfirmed byhead imaging.CT headshowed no acute bleedsand MRIshowed subacute stroke with no new findings. The patient's sister received clearance to be in the hospital. The sister says Ronald [name].   Of note, with the assistance of his sister, Ronald [name] shared that he wen to [employer] for college. He went to school with the late astronaut Celene Skeen, who was his best friend. He played saxophone and piano, and was  a band Mudlogger and special needs teacher.  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 The first siezure at the nursing home was resolved with 5mg  of Versed once EMS arrived. The patient seized again in the ED where 5mg totalof Ativan and 2000 mgofIV Keppra were administered. Subsequent EEG monitoring did not reveal seizure like activity.Of note, the patient is having involuntary LUE low amplitude jerks.Neurology evaluated and thinks the pt is having EPC. LUE no longer jerking. Neuro recommendations are as follows: - Keppra1500 mgBID -Vimpat150 mgBID - Depakote 1g Q12H, change to ER formulation. Will need a couple days to reach  steady state. - Ativan PRN   #FEN/GI #Hypokalemia #Hypomagnemesia  - Repleted - BMPevery other day, as labs have been stable. - Strict I/O -Aspirtation risk,SLP recs dysphagia 1(puree); honey-thick liquids via teaspoon.   #R parietal subacute stroke #Dysarthria: Much improved today. Can understand what the patient is saying much better than previous days. The findings on CT headand MRIconsistent with previousRMCA stroke w/ L sided deficits that are unchanged.  - Atorvastatin 81 mg daily -Plavix 74mg  daily - Lovenox 40mg  injection daily - Aspirin 325 mg daily - Nurse looking into getting patient a board to improve communication   #Decreased urine output -Bladder scan this a.m. revealed 300 cc of urine, repeat bladder scan at 6 PM tonight  #HTN -Metoprolol50mg  PO BID - Lisinopril 10mg  PO daily  Dispo: Anticipated dischargedependent on SNF placement. Per SW, [facility] does not have a bed. Will continue to look for SNF.  [name], MD Internal Medicine, PGY1 Pager: [phone]  10/06/2018,1:05 PM

## 2018-10-06 NOTE — Progress Notes (Signed)
Pt's sister at the bedside and provided and update on the patients status by myself and the doctor. Pt currently lying in bed, alert, and showing no signs of distress. RN to continue to monitor.

## 2018-10-06 NOTE — Progress Notes (Signed)
Bladder scan completed, and 300 mL noted. MD notified. RN to continue to monitor.

## 2018-10-06 NOTE — Progress Notes (Signed)
Subjective: Twitching has stopped.   Dysarthria and left arm weakness similar to two days ago(improved from yesterday)  Exam: Vitals:   10/05/18 2007 10/06/18 0429  BP: (!) 85/74 128/67  Pulse: 69 63  Resp:    Temp: 98.3 F (36.8 C) 97.6 F (36.4 C)  SpO2:  90%   Gen: In bed, NAD Resp: non-labored breathing, no acute distress Abd: soft, nt  Neuro: MS: Awake, alert, very difficult to understand very dysarthric, and slightly antagonistic CN: Pupils equal and reactive to light, visual fields are full, EOMI. his left facial droop is significantly more pronounced today. Motor: He has good strength on his right side, on his left he has 3/5 strength left lower extremity, 1/5 distally and 3/5 proximally in the left upper extremity.  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   I suspect that his worsening of his left-sided weakness and dysarthria was likely related to his benzodiazepine.  With twitching cessation, hopefully we have made some progress.    Recommendations: 1) keppra 1500 mg BID 2) Vimpat 150mg  BID 3) Increase depakote to 1g Q12H, change to ER formulation, will need level in a couple of days.  5) will continue to follow.   Roland Rack, MD [facility] [phone]  If 7pm- 7am, please page neurology on call as listed in [place].

## 2018-10-06 NOTE — Progress Notes (Signed)
Nutrition Follow-up  RD working remotely.  DOCUMENTATION CODES:   Obesity unspecified  INTERVENTION:   -Continue MVI with minerals daily -Continue magic cup TID with meals, each supplement provides 290 kcal and 9 grams of protein -Continue greek yogurt with each breakfast tray -Continue pudding with lunch and dinner trays -Continue feeding assistance with meals  NUTRITION DIAGNOSIS:   Inadequate oral intake related to lethargy/confusion, dysphagia as evidenced by meal completion < 25%.  Progressing   GOAL:   Patient will meet greater than or equal to 90% of their needs  Progressing   MONITOR:   PO intake, Supplement acceptance, Diet advancement, Labs, Weight trends, Skin, I & O's  REASON FOR ASSESSMENT:   Consult Enteral/tube feeding initiation and management  ASSESSMENT:   Mr. Cadman is a 73 y.o male with a PMHx significant of prostate cancer, dementia, heart disease, hyperlipidemia, HTN, measles, mumps, schizophrenia, multiple TIA, stroke (most recent R MCA infarct w/ L sided deficits on 6/4) who lives in a SNF who presents to the hospital for a tonic clonic seizure. He was initially found on the floor by nurses showing seizure like activity. Per nursing, the patient's sister says he seized for about 30 minutes prior till EMS arrival. He was given 5 mg of Versed IM once EMS arrived which ended the seizure like activity. When he arrived at the ED, he started having seizure like activity again, exhibiting left sided tonic-clonic jerks. He also had a leftward deviated gaze.  He was given 5mg  total of Ativan and 2000 mg IV Keppra load. Neurology also evaluated and subsequently performed an EEG which did not show any more seizure activity.  7/6- cortrak tube placed, pt pulled out, tube placement on hold 7/7- s/p BSE- advanced to dysphagia 1 diet with honey thick liquids  Reviewed I/O's: +1.8 L x 24 hours and +1.5 L since admission  UOP: 525 ml x 24 hours   Per chart  review, pt more alert. PO intake has improved; noted meal completion 50-100%.   Per MD notes, pt awaiting SNF placement on discharge.   Labs reviewed: CBGS: 58-78.   Diet Order:   Diet Order            DIET - DYS 1 Room service appropriate? Yes; Fluid consistency: Honey Thick  Diet effective now              EDUCATION NEEDS:   No education needs have been identified at this time  Skin:  Skin Assessment: Reviewed RN Assessment Stage I: -  Last BM:  10/04/18  Height:   Ht Readings from Last 1 Encounters:  09/29/18 5\' 8"  (1.727 m)    Weight:   Wt Readings from Last 1 Encounters:  09/29/18 97.5 kg    Ideal Body Weight:  70 kg  BMI:  Body mass index is 32.68 kg/m.  Estimated Nutritional Needs:   Kcal:  9470-9628  Protein:  90-105 grams  Fluid:  > 1.7 L    Liliah Dorian A. Jimmye Norman, RD, LDN, [place] Registered Dietitian II Certified Diabetes Care and Education Specialist Pager: [phone] After hours Pager: [phone]

## 2018-10-07 LAB — GLUCOSE, CAPILLARY
Glucose-Capillary: 113 mg/dL — ABNORMAL HIGH (ref 70–99)
Glucose-Capillary: 133 mg/dL — ABNORMAL HIGH (ref 70–99)
Glucose-Capillary: 63 mg/dL — ABNORMAL LOW (ref 70–99)
Glucose-Capillary: 74 mg/dL (ref 70–99)
Glucose-Capillary: 86 mg/dL (ref 70–99)
Glucose-Capillary: 90 mg/dL (ref 70–99)
Glucose-Capillary: 99 mg/dL (ref 70–99)

## 2018-10-07 LAB — VALPROIC ACID LEVEL: Valproic Acid Lvl: 54 ug/mL (ref 50.0–100.0)

## 2018-10-07 MED ORDER — DEXTROSE 50 % IV SOLN
INTRAVENOUS | Status: AC
  Filled 2018-10-07: qty 50

## 2018-10-07 NOTE — Progress Notes (Signed)
Subjective: Continues to be significantly improved, states that his left arm feels about is a good after a stroke  Exam: Vitals:   10/07/18 0030 10/07/18 0613  BP: (!) 151/60 (!) 159/64  Pulse: 62 68  Resp:    Temp: (!) 97.4 F (36.3 C) 98 F (36.7 C)  SpO2: 97% 100%   Gen: In bed, NAD Resp: non-labored breathing, no acute distress Abd: soft, nt  Neuro: MS: Awake, alert, very difficult to understand very dysarthric, and slightly antagonistic CN: Pupils equal and reactive to light, visual fields are full, EOMI. his left facial droop is significantly more pronounced today. Motor: He has good strength on his right side, on his left he has 3/5 strength left lower extremity, 1/5 distally and 3/5 proximally in the left upper extremity.  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  Likely his partial status epilepticus is resolved, at this point I would continue his current regimen at least throughout the intermediate term, for several months.  Recommendations: 1) keppra 1500 mg BID 2) Vimpat 150mg  BID 3) Depakote 1 g twice daily 4) will continue to follow.   Roland Rack, MD [facility] [phone]  If 7pm- 7am, please page neurology on call as listed in [place].

## 2018-10-07 NOTE — Progress Notes (Signed)
  Date: 10/07/2018  Patient name: Ronald [name]  Medical record number: [id number]  Date of birth: [birth year]   I have seen and evaluated this patient and I have discussed the plan of care with the house staff. Please see their note for complete details. I concur with their findings with the following additions/corrections:   Remains quite alert and engaged today. Still spouting a constant stream of threats of violence, but pleasant toward me today.   Discussed with neurology, appreciate assistance, plan to continue current regimen for the near future as he is doing well.  UOP has improved, bladder scan is borderline, if UOP drops again, will rescan.  Will work toward discharge to SNF, medically cleared when bed available.   Lenice Pressman, M.D., Ph.D. 10/07/2018, 3:11 PM

## 2018-10-07 NOTE — Progress Notes (Signed)
Subjective: Patient seen on morning rounds today.  Resting comfortably in bed.  Says nothing is bothering him today.  Appears to be laying on top of his condom catheter.  Patient says he thinks he can get up and go to the bathroom on his own.  Objective:  Vital signs in last 24 hours: Vitals:   10/06/18 1308 10/06/18 2019 10/07/18 0030 10/07/18 0613  BP: (!) 152/71 (!) 158/74 (!) 151/60 (!) 159/64  Pulse: 64 67 62 68  Resp: 16     Temp: 97.6 F (36.4 C) 97.6 F (36.4 C) (!) 97.4 F (36.3 C) 98 F (36.7 C)  TempSrc:      SpO2: 100% 100% 97% 100%  Weight:      Height:       Physical Exam Constitutional:      General: He is not in acute distress.    Appearance: Normal appearance. He is not ill-appearing, toxic-appearing or diaphoretic.  HENT:     Head: Normocephalic and atraumatic.     Mouth/Throat:     Mouth: Mucous membranes are moist.  Eyes:     Extraocular Movements: Extraocular movements intact.  Cardiovascular:     Rate and Rhythm: Normal rate and regular rhythm.     Pulses: Normal pulses.     Heart sounds: Normal heart sounds. No murmur. No friction rub. No gallop.   Pulmonary:     Effort: Pulmonary effort is normal. No respiratory distress.     Breath sounds: Normal breath sounds. No wheezing or rales.  Abdominal:     General: Abdomen is flat. Bowel sounds are normal. There is no distension.     Palpations: Abdomen is soft.     Tenderness: There is no abdominal tenderness. There is no guarding.  Genitourinary:    Comments: Patient laying on top of condom catheter with penis stuck between legs Musculoskeletal:        General: No swelling or tenderness.  Neurological:     Mental Status: He is alert. Mental status is at baseline.     Sensory: No sensory deficit.     Deep Tendon Reflexes: Reflexes abnormal (Hyperreflexia diffusely on upper and lower extremities of the left side).     Comments: Left-sided weakness    Psychiatric:     Comments: Curses and  threats other team providers during examination    Assessment/Plan:  Principal Problem:   Seizure ([place]) Active Problems:   Seizure-like activity ([place])   Dysphagia  In summary, Ronald [name] is a 73 y.o male with a PMHx significant for prostate cancer, dementia, heart disease, hyperlipidemia, HTN, measles, mumps, schizophrenia, multiple TIA, and stroke who presented today with tonic clonic seizures, suspected to besecondary to a R sided subacute strokein mid Juneconfirmed byhead imaging.CT headshowed no acute bleedsand MRIshowed subacute stroke with no new findings. The patient's sister received clearance to be in the hospital. The sister says Ronald [name] appears to be at his baseline.   Of note, with the assistance of his sister, Ronald [name] shared that he wen to [employer] for college. He went to school with the late astronaut Celene Skeen, who was his best friend. He played saxophone and piano, and was a band Mudlogger and special needs Pharmacist, hospital.  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 The first siezure at the nursing home was resolved with 5mg  of Versed once EMS arrived. The patient seized again in  the ED where 5mg totalof Ativan and 2000 mgofIV Keppra were administered. Subsequent EEG monitoring did not reveal seizure like activity.Of note, the patient is having involuntary LUE low amplitude jerks.Neurology evaluated andthinks the pt is having EPC. LUE no longer jerking. Neuro recs are as follows: - Keppra1500 mgBID -Vimpat150 mgBID - Depakote 1g ER Q12H. Will need about 2 days to reach steady state. Monitor levels - Ativan PRN   #FEN/GI #Hypokalemia #Hypomagnemesia  - Repleted - BMPevery other day, as labs have been stable. - Strict I/O -Aspirtation risk,SLP recsdysphagia 1(puree); honey-thick liquids via  teaspoon.   #R parietal subacute stroke #Dysarthria: Much improved today. Can understand what the patient is saying much better than previous days. The findings on CT headand MRIconsistent with previousRMCA stroke w/ L sided deficits that are unchanged.  - Atorvastatin 81 mg daily -Plavix 74mg  daily - Lovenox 40mg  injection daily - Aspirin 325 mg daily - Nurse looking into getting patient a board to improve communication  #Decreased urine output: Resolved -Bladder scan this on 7/11 AM revealed 300 cc of urine -24-hour urine output greater than 1 L  #HTN -Metoprolol50mg  PO BID - Lisinopril 10mg  PO daily  Dispo: Anticipated dischargedependent on SNF placement. Per SW, [facility] does not have a bed.  Sister says she is trying to figure it out.  Will continue to look for SNF.  [name], MD Internal Medicine, PGY1 Pager: [phone]  10/07/2018,8:41 AM

## 2018-10-08 LAB — GLUCOSE, CAPILLARY
Glucose-Capillary: 113 mg/dL — ABNORMAL HIGH (ref 70–99)
Glucose-Capillary: 118 mg/dL — ABNORMAL HIGH (ref 70–99)
Glucose-Capillary: 121 mg/dL — ABNORMAL HIGH (ref 70–99)
Glucose-Capillary: 71 mg/dL (ref 70–99)
Glucose-Capillary: 79 mg/dL (ref 70–99)
Glucose-Capillary: 80 mg/dL (ref 70–99)
Glucose-Capillary: 90 mg/dL (ref 70–99)
Glucose-Capillary: 92 mg/dL (ref 70–99)

## 2018-10-08 MED ORDER — HYDRALAZINE HCL 20 MG/ML IJ SOLN
5.0000 mg | Freq: Three times a day (TID) | INTRAMUSCULAR | Status: DC | PRN
  Administered 2018-10-08 – 2018-10-16 (×5): 5 mg via INTRAVENOUS
  Filled 2018-10-08 (×5): qty 1

## 2018-10-08 MED ORDER — LISINOPRIL 20 MG PO TABS
20.0000 mg | ORAL_TABLET | Freq: Every day | ORAL | Status: DC
  Administered 2018-10-08 – 2018-10-16 (×8): 20 mg via ORAL
  Filled 2018-10-08 (×9): qty 1

## 2018-10-08 NOTE — Progress Notes (Signed)
Unable to reach sister for daily update. Left voicemail.

## 2018-10-08 NOTE — Progress Notes (Signed)
MD notified of pt's BP 181/84. Asymptomatic. Will continue to monitor.

## 2018-10-08 NOTE — Progress Notes (Signed)
  Date: 10/08/2018  Patient name: Ronald [name]  Medical record number: [id number]  Date of birth: 1945-09-19   I have seen and evaluated this patient and I have discussed the plan of care with the house staff. Please see their note for complete details. I concur with their findings with the following additions/corrections:   Speech more dysarthric today, but still vigorous. Today talked about his dogs attacking the postman.  Lenice Pressman, M.D., Ph.D. 10/08/2018, 4:29 PM

## 2018-10-08 NOTE — Progress Notes (Signed)
Sister, Florian Buff, returned call. Daily update given.

## 2018-10-08 NOTE — Progress Notes (Signed)
No significant changes. Would continue current AED regimen. 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   Please call with further questions or concerns.   Roland Rack, MD [facility] [phone]  If 7pm- 7am, please page neurology on call as listed in [place].

## 2018-10-08 NOTE — Progress Notes (Signed)
Subjective: Patient seen on rounds this morning.  Resting comfortably in bed.  Ice cream.  Repeatedly threats other people in the room.  Objective:  Vital signs in last 24 hours: Vitals:   10/07/18 2238 10/08/18 0530 10/08/18 1250 10/08/18 1432  BP: (!) 150/61 (S) (!) 175/89 (!) 181/84 (!) 157/75  Pulse: 64 66 68 72  Resp:   20   Temp:   98.3 F (36.8 C)   TempSrc:   Oral   SpO2:  100% 100%   Weight:      Height:       Physical Exam Constitutional:      General: He is not in acute distress.    Appearance: He is not ill-appearing, toxic-appearing or diaphoretic.  HENT:     Head: Normocephalic and atraumatic.  Eyes:     General: No scleral icterus.    Extraocular Movements: Extraocular movements intact.     Pupils: Pupils are equal, round, and reactive to light.  Cardiovascular:     Rate and Rhythm: Normal rate and regular rhythm.     Pulses: Normal pulses.     Heart sounds: Normal heart sounds. No murmur. No friction rub. No gallop.   Pulmonary:     Effort: Pulmonary effort is normal. No respiratory distress.     Breath sounds: Normal breath sounds. No wheezing or rales.     Comments: Upper airway transmission.  Frequently coughs and exam. Abdominal:     General: Abdomen is flat. Bowel sounds are normal. There is no distension.     Palpations: Abdomen is soft.     Tenderness: There is no abdominal tenderness. There is no guarding.  Musculoskeletal:        General: No swelling or deformity.     Right lower leg: No edema.     Left lower leg: No edema.     Comments: Spontaneously moves all 4 extremities, R>L  Skin:    General: Skin is warm.  Neurological:     Mental Status: He is alert and oriented to person, place, and time. Mental status is at baseline.  Psychiatric:     Comments: Frequently threats other healthcare providers in the room.     Assessment/Plan:  Principal Problem:   Seizure ([place]) Active Problems:   Seizure-like activity ([place])    Dysphagia  In summary, Mr. Capraro is a 74 y.o male with a PMHx significant for prostate cancer, dementia, heart disease, hyperlipidemia, HTN, measles, mumps, schizophrenia, multiple TIA, and stroke who presented today with tonic clonic seizures, suspected to besecondary to a R sided subacute strokein mid Juneconfirmed byhead imaging.CT headshowed no acute bleedsand MRIshowed subacute stroke with no new findings. The patient's sister received clearance to be in the hospital.The sister says Mr. Asato appears to be at his baseline.   Of note, with the assistance of his sister, Mr. Holzman shared that he wen to [employer] for college. He went to school with the late astronaut Celene Skeen, who was his best friend. He played saxophone and piano, and was a band Insurance account manager.  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 The first siezure at the nursing home was resolved with 5mg  of Versed once EMS arrived. The patient seized again in the ED where 5mg totalof Ativan and 2000 mgofIV Keppra were administered. Subsequent EEG monitoring did not reveal seizure like activity.Of note, the patient is having involuntary LUE  low amplitude jerks.Neurology evaluated andthinks the pt is having EPC.LUE no longer jerking. Neuro recs are as follows: - Keppra1500 mgBID -Vimpat150 mgBID - Depakote1g ER Q12H - Neurology to sign off  #FEN/GI #Hypokalemia #Hypomagnemesia - Repleted - BMPevery other day, as labs have been stable. - Strict I/O -Aspirtation risk,SLP recsdysphagia 1(puree); honey-thick liquids via teaspoon.   #R parietal subacute stroke #Dysarthria:Much improved today. Can understand what the patient is saying much better than previous days. The findings on CT headand MRIconsistent with previousRMCA  stroke w/ L sided deficits that are unchanged.  - Atorvastatin 81 mg daily -Plavix 74mg  daily - Lovenox 40mg  injection daily - Aspirin 325 mg daily  #HTN -Metoprolol50mg  PO BID - Lisinopril increased from 10 to 20mg  PO daily - PRN hydralazine for BP greater than 750 systolic  Dispo: Anticipated dischargedependent on SNF placement. Per SW, [facility] does not have a bed.  Sister says she is trying to figure it out.  Will continue to look for SNF.  Medically stable for discharge.  [name], MD 10/08/2018, 5:04 PM Pager: [phone]

## 2018-10-08 NOTE — Progress Notes (Addendum)
Messaged IM Resident per BP 175/89, HR 66, asking if wants to order PRN BP meds with parameters.  MD returned call. Stated Lisinospril dosage  will be increased on [facility]

## 2018-10-08 NOTE — Progress Notes (Addendum)
Subjective: Patient examined at the bedside this morning. Eating ice cream unassisted upon entry. He was having some productive coughs during our conversation with him today. Still alert and talkative today. Unfortunately, he is still communicating threats to some members of the medical team.  Objective:  Vital signs in last 24 hours: Vitals:   10/07/18 1236 10/07/18 2040 10/07/18 2238 10/08/18 0530  BP: (!) 179/73 (!) 185/87 (!) 150/61 (S) (!) 175/89  Pulse: 66 72 64 66  Resp: 20     Temp: 98.6 F (37 C)     TempSrc: Oral     SpO2: 100% 100%  100%  Weight:      Height:       Physical Exam Constitutional:      General: He is not in acute distress.    Appearance: Normal appearance. He is not ill-appearing, toxic-appearing or diaphoretic.  HENT:     Head: Normocephalic and atraumatic.     Mouth/Throat:     Mouth: Mucous membranes are moist.  Eyes:     General: No scleral icterus.    Extraocular Movements: Extraocular movements intact.     Pupils: Pupils are equal, round, and reactive to light.  Cardiovascular:     Rate and Rhythm: Normal rate and regular rhythm.     Pulses: Normal pulses.     Heart sounds: Normal heart sounds. No murmur. No friction rub.  Pulmonary:     Effort: Pulmonary effort is normal. No respiratory distress.     Breath sounds: Normal breath sounds. No wheezing or rales.     Comments: Intermittent coughing Abdominal:     General: Abdomen is flat. There is no distension.     Palpations: Abdomen is soft.     Tenderness: There is no abdominal tenderness. There is no guarding.  Musculoskeletal:        General: No swelling or deformity.     Comments: Spontaneously moves all extremities antigravity, R>L  Skin:    General: Skin is warm.  Neurological:     Mental Status: He is alert and oriented to person, place, and time. Mental status is at baseline.  Psychiatric:     Comments: Threatening team members     Assessment/Plan:  Principal Problem:  Seizure ([place]) Active Problems:   Seizure-like activity ([place])   Dysphagia  In summary, Ronald [name] is a 73 y.o male with a PMHx significant for prostate cancer, dementia, heart disease, hyperlipidemia, HTN, measles, mumps, schizophrenia, multiple TIA, and stroke who presented today with tonic clonic seizures, suspected to besecondary to a R sided subacute strokein mid Juneconfirmed byhead imaging.CT headshowed no acute bleedsand MRIshowed subacute stroke with no new findings. The patient's sister received clearance to be in the hospital.The sister says Ronald [name] appears to be at his baseline.   Of note, with the assistance of his sister, Ronald [name] shared that he wen to [employer] for college. He went to school with the late astronaut Celene Skeen, who was his best friend. He played saxophone and piano, and was a band Insurance account manager.  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 The first siezure at the nursing home was resolved with 5mg  of Versed once EMS arrived. The patient seized again in the ED where 5mg totalof Ativan and 2000 mgofIV Keppra were administered. Subsequent EEG monitoring did not reveal seizure like activity.Of note, the patient is having involuntary LUE low  amplitude jerks.Neurology evaluated andthinks the pt is having EPC.LUE no longer jerking. Neuro recs are as follows: - Keppra1500 mgBID -Vimpat150 mgBID - Depakote1g ER Q12H.   #FEN/GI #Hypokalemia #Hypomagnemesia - Repleted - BMPevery other day, as labs have been stable. - Strict I/O -Aspirtation risk,SLP recsdysphagia 1(puree); honey-thick liquids via teaspoon.   #R parietal subacute stroke #Dysarthria:Much improved today. Can understand what the patient is saying much better than previous days. The findings on CT  headand MRIconsistent with previousRMCA stroke w/ L sided deficits that are unchanged.  - Atorvastatin 81 mg daily -Plavix 74mg  daily - Lovenox 40mg  injection daily - Aspirin 325 mg daily  #HTN -Metoprolol50mg  PO BID - Lisinopril 10mg  PO daily  Dispo: Anticipated dischargedependent on SNF placement. Per SW, [facility] does not have a bed.  Sister says she is trying to figure it out.  Will continue to look for SNF.  Patient is medically stable and ready for discharge once SNF is found.  [name], MD Internal Medicine, PGY1 Pager: [phone]  10/08/2018,1:42 PM

## 2018-10-09 DIAGNOSIS — E876 Hypokalemia: Secondary | ICD-10-CM

## 2018-10-09 DIAGNOSIS — R471 Dysarthria and anarthria: Secondary | ICD-10-CM

## 2018-10-09 DIAGNOSIS — G40101 Localization-related (focal) (partial) symptomatic epilepsy and epileptic syndromes with simple partial seizures, not intractable, with status epilepticus: Principal | ICD-10-CM

## 2018-10-09 LAB — GLUCOSE, CAPILLARY
Glucose-Capillary: 69 mg/dL — ABNORMAL LOW (ref 70–99)
Glucose-Capillary: 85 mg/dL (ref 70–99)
Glucose-Capillary: 78 mg/dL (ref 70–99)
Glucose-Capillary: 100 mg/dL — ABNORMAL HIGH (ref 70–99)
Glucose-Capillary: 101 mg/dL — ABNORMAL HIGH (ref 70–99)
Glucose-Capillary: 82 mg/dL (ref 70–99)

## 2018-10-09 LAB — BASIC METABOLIC PANEL
Anion gap: 8 (ref 5–15)
BUN: 5 mg/dL — ABNORMAL LOW (ref 8–23)
CO2: 26 mmol/L (ref 22–32)
Calcium: 8.5 mg/dL — ABNORMAL LOW (ref 8.9–10.3)
Chloride: 108 mmol/L (ref 98–111)
Creatinine, Ser: 0.73 mg/dL (ref 0.61–1.24)
GFR calc Af Amer: >60 mL/min (ref >60)
GFR calc non Af Amer: >60 mL/min (ref >60)
Glucose, Bld: 86 mg/dL (ref 70–99)
Potassium: 3.8 mmol/L (ref 3.5–5.1)
Sodium: 142 mmol/L (ref 135–145)

## 2018-10-09 LAB — CBC
HCT: 38.3 % — ABNORMAL LOW (ref 39.0–52.0)
Hemoglobin: 11.7 g/dL — ABNORMAL LOW (ref 13.0–17.0)
MCH: 29 pg (ref 26.0–34.0)
MCHC: 30.5 g/dL (ref 30.0–36.0)
MCV: 95 fL (ref 80.0–100.0)
Platelets: 264 10*3/uL (ref 150–400)
RBC: 4.03 MIL/uL — ABNORMAL LOW (ref 4.22–5.81)
RDW: 14.1 % (ref 11.5–15.5)
WBC: 6.1 10*3/uL (ref 4.0–10.5)
nRBC: 0 % (ref 0.0–0.2)

## 2018-10-09 MED ORDER — LEVETIRACETAM 100 MG/ML PO SOLN
1500.0000 mg | Freq: Two times a day (BID) | ORAL | Status: DC
  Administered 2018-10-09 – 2018-10-16 (×16): 1500 mg via ORAL
  Filled 2018-10-09 (×17): qty 15

## 2018-10-09 MED ORDER — LEVETIRACETAM 750 MG PO TABS: 1500.0000 mg | ORAL_TABLET | Freq: Two times a day (BID) | ORAL | Status: DC

## 2018-10-09 MED ORDER — DEXTROSE 50 % IV SOLN
INTRAVENOUS | Status: AC
  Filled 2018-10-09: qty 50

## 2018-10-09 MED ORDER — LACOSAMIDE 50 MG PO TABS
150.0000 mg | ORAL_TABLET | Freq: Two times a day (BID) | ORAL | Status: DC
  Administered 2018-10-09 – 2018-10-16 (×15): 150 mg via ORAL
  Filled 2018-10-09 (×16): qty 3

## 2018-10-09 NOTE — Consult Note (Signed)
Pharmacy student rounding with the IMTS/B2/[place] service was asked to consult on transitioning the patient to oral medications for Epilepsia [place] Continua.   I have evaluated his baseline laboratory values as documented in his history and physical exam in addition to the consultations and therapeutic drug monitoring per the Neurology service.   Patient's current therapy consists of Keppra (levetiracetam), Vimpat (lacosamide), and Depakote (divalproex). Due the patient's inability to swallow tablets/capsules and the available dosage forms of his current regimen the follow recommendations are available for consideration.  Keppra (levetiracetam) tablets cannot be crushed/chewed based on the package insert regardless of release duration (immediate/extended release). It would be appropriate to start this patient on Keppra oral solution 100 mg/ 30mL at the same dose as his IV regimen (1,500 mg or 33mL twice daily). The oral solution can be mixed with thickener if needed to prevent aspiration of this medication.  Vimpat (lacosamide) tablets can be crushed and mixed with applesauce per the Institute for Safe Medication Practices (ISMP Do Not Crush List), however this is not listed in the medication package insert. It is reasonable for Vimpat to be crushed and added to thickened puree/liquid based on the oral bioavailability and the lack of effect of food on absorption.  Depakote (divalproex) is available as a sprinkle capsule which can be opened and sprinkled onto 1 tablespoon of soft food, such as applesauce or pudding per the package insert.    [place]

## 2018-10-09 NOTE — Progress Notes (Signed)
Subjective: Patient reports feeling fine. Understand he is on seizure medicine. Ask doctor to loosen mits on hands. Goes into tangent , talking about his bulldogs.   Objective:  Vital signs in last 24 hours: Vitals:   10/08/18 1250 10/08/18 1432 10/08/18 2155 10/09/18 0534  BP: (!) 181/84 (!) 157/75 (!) 153/75 (!) 168/72  Pulse: 68 72 72 62  Resp: 20  18 20   Temp: 98.3 F (36.8 C)  98.4 F (36.9 C) 98.9 F (37.2 C)  TempSrc: Oral   Axillary  SpO2: 100%  97% 100%  Weight:    94.6 kg  Height:       Physical Exam Constitutional:      General: He is not in acute distress.    Appearance: He is normal weight. He is not ill-appearing, toxic-appearing or diaphoretic.  HENT:     Head: Normocephalic and atraumatic.  Eyes:     General: No scleral icterus.       Right eye: No discharge.        Left eye: No discharge.     Extraocular Movements: Extraocular movements intact.     Pupils: Pupils are equal, round, and reactive to light.  Cardiovascular:     Rate and Rhythm: Normal rate and regular rhythm.     Pulses: Normal pulses.     Heart sounds: Normal heart sounds. No murmur. No gallop.   Pulmonary:     Effort: Pulmonary effort is normal. No respiratory distress.     Breath sounds: Normal breath sounds. No wheezing.  Abdominal:     General: Abdomen is flat. Bowel sounds are normal. There is no distension.     Palpations: Abdomen is soft.     Tenderness: There is no abdominal tenderness. There is no guarding.  Musculoskeletal:        General: No swelling, tenderness or deformity.  Skin:    General: Skin is warm.  Neurological:     Mental Status: He is alert and oriented to person, place, and time. Mental status is at baseline.     Comments: Left-sided weakness due to previous stroke.  Moves all extremities spontaneously.  Psychiatric:     Comments: Talked about releasing dogs on nurses     Assessment/Plan:  Principal Problem:   Seizure ([place]) Active Problems:  Seizure-like activity ([place])   Dysphagia  In summary, Mr. Powe is a 73 y.o male with a PMHx significant for prostate cancer, dementia, heart disease, hyperlipidemia, HTN, measles, mumps, schizophrenia, multiple TIA, and stroke who presented today with tonic clonic seizures, suspected to besecondary to a R sided subacute strokein mid Juneconfirmed byhead imaging.CT headshowed no acute bleedsand MRIshowed subacute stroke with no new findings. The patient's sister received clearance to be in the hospital.The sister says Mr. Robben appears to be at his baseline.   Of note, with the assistance of his sister, Mr. Wion shared that he wen to [employer] for college. He went to school with the late astronaut Celene Skeen, who was his best friend. He played saxophone and piano, and was a band Insurance account manager.  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 The first siezure at the nursing home was resolved with 5mg  of Versed once EMS arrived. The patient seized again in the ED where 5mg totalof Ativan and 2000 mgofIV Keppra were administered. Subsequent EEG monitoring did not reveal seizure like activity.Of note, the patient is  having involuntary LUE low amplitude jerks.Neurology evaluated andthinks the pt is having EPC.LUE no longer jerking. Neuro recsare as follows: - Keppra1500 mgBID -Vimpat150 mgBID - Depakote1gERQ12H - Neurology signed off on 7/12  #FEN/GI #Hypokalemia #Hypomagnemesia - Repleted -Aspirtation risk,SLP recsdysphagia 1(puree); honey-thick liquids via teaspoon.   #R parietal subacute stroke #Dysarthria:Much improved today. Can understand what the patient is saying much better than previous days. The findings on CT headand MRIconsistent with previousRMCA stroke w/ L sided deficits that  are unchanged.  - Atorvastatin 81 mg daily -Plavix 74mg  daily - Lovenox 40mg  injection daily - Aspirin 325 mg daily  #HTN -Metoprolol50mg  PO BID - Lisinopril 20mg  PO daily - PRN hydralazine for BP greater than 160 systolic  Dispo: Anticipated dischargedependent on SNF placement. Per SW, [facility] does not have a bed.Sister says she is trying to figure it out. Will continue to look for SNF.  Medically stable for discharge.

## 2018-10-09 NOTE — Progress Notes (Addendum)
Physical Therapy Treatment Patient Details Name: Lamorris Knoblock MRN: [id number] DOB: 1945/08/04 Today's Date: 10/09/2018    History of Present Illness Patient is a 73 y/o male presenting to the ED on 09/29/2018 with priamry complaints of Tonic clonic seizure secondary to stroke. CT showed R parietal lobe hypodensity consistent with a subacute stroke. Of note, recent hospital discharge on 09/14/2018 for L MCA stroke for which he received tPA and angioplasty of the R terminal ICA. Past medical history of prostate cancer, dementia, heart disease, hyperlipidemia, HTN, measles, mumps, schizophrenia, multiple TIA, stroke.    PT Comments    Patient received in bed, initially agreeable to PT. However became a bit more agitated throughout session. Patient has difficulty following direction, decreased safety awareness. Required max assist for supine to sit, advancing LEs off bed and raining trunk to seated position. Once seated patient holding to rail with R UE and pushing to left. Despite various cues, patient is unable to self correct, unable to correct with assistance. Patient performed sit to stand transfer x 2 reps with min +2 assist. Unable to maintain standing for more than a few seconds each time. Patient is unsafe to attempt ambulation this day. He will continue to benefit from skilled PT to address his weakness, and difficulty with mobility.        Follow Up Recommendations  SNF;Supervision/Assistance - 24 hour     Equipment Recommendations  None recommended by PT    Recommendations for Other Services       Precautions / Restrictions Precautions Precautions: Fall Restrictions Weight Bearing Restrictions: No    Mobility  Bed Mobility Overal bed mobility: Needs Assistance Bed Mobility: Supine to Sit;Sit to Supine     Supine to sit: Max assist;+2 for physical assistance;Total assist Sit to supine: +2 for physical assistance;Total assist;Max assist   General bed mobility comments:  Max/Total A +2 for all bed level mobility; once sitting EOB patient using R UE to hold onto hand rail for upright balance. Patient demonstrating pushing syndrome. Pushing heavily to left with right UE. Cues and assist to maintain midline.  Transfers Overall transfer level: Needs assistance Equipment used: 2 person hand held assist Transfers: Sit to/from Stand           General transfer comment: deferred, unsafe  Ambulation/Gait             General Gait Details: deferred due to decreased safety, unable to bring L LE in toward body to improve standing balance. Able to stand x 2 reps for about 10 sec each.   Stairs             Wheelchair Mobility    Modified Rankin (Stroke Patients Only) Modified Rankin (Stroke Patients Only) Pre-Morbid Rankin Score: Moderately severe disability Modified Rankin: Severe disability     Balance Overall balance assessment: Needs assistance Sitting-balance support: Single extremity supported;Feet supported Sitting balance-Leahy Scale: Poor Sitting balance - Comments: varying levels of assist needed - up to Max A for upright balance Postural control: Left lateral lean Standing balance support: Single extremity supported Standing balance-Leahy Scale: Fair Standing balance comment: unable to maintain standing for long                            Cognition Arousal/Alertness: Awake/alert Behavior During Therapy: Agitated Overall Cognitive Status: No family/caregiver present to determine baseline cognitive functioning Area of Impairment: Orientation;Following commands;Safety/judgement;Problem solving;Attention  Orientation Level: Situation;Place;Disoriented to Current Attention Level: Selective Memory: Decreased short-term memory Following Commands: Follows one step commands inconsistently;Follows one step commands with increased time Safety/Judgement: Decreased awareness of safety;Decreased awareness of  deficits Awareness: Intellectual Problem Solving: Slow processing;Requires verbal cues;Requires tactile cues;Decreased initiation;Difficulty sequencing General Comments: difficulty understanding patients speech at times. poor awareness      Exercises      General Comments        Pertinent Vitals/Pain Pain Assessment: Faces Faces Pain Scale: No hurt    Home Living Family/patient expects to be discharged to:: Skilled nursing facility     Type of Home: House       Home Equipment: Gilford Rile - 2 wheels;Cane - single point;Wheelchair - manual;Tub bench      Prior Function Level of Independence: Needs assistance      Comments: resident of SNF prior to admission - unsure of PLOF   PT Goals (current goals can now be found in the care plan section) Acute Rehab PT Goals Patient Stated Goal: none stated PT Goal Formulation: With patient Time For Goal Achievement: 10/17/18 Potential to Achieve Goals: Fair    Frequency    Min 2X/week      PT Plan Current plan remains appropriate    Co-evaluation              AM-PAC PT "6 Clicks" Mobility   Outcome Measure  Help needed turning from your back to your side while in a flat bed without using bedrails?: Total Help needed moving from lying on your back to sitting on the side of a flat bed without using bedrails?: Total Help needed moving to and from a bed to a chair (including a wheelchair)?: Total Help needed standing up from a chair using your arms (e.g., wheelchair or bedside chair)?: Total Help needed to walk in hospital room?: Total Help needed climbing 3-5 steps with a railing? : Total 6 Click Score: 6    End of Session Equipment Utilized During Treatment: Gait belt Activity Tolerance: Treatment limited secondary to agitation;Patient tolerated treatment well Patient left: in bed;with bed alarm set;with call bell/phone within reach Nurse Communication: Mobility status PT Visit Diagnosis: Muscle weakness  (generalized) (M62.81);Unsteadiness on feet (R26.81);Other abnormalities of gait and mobility (R26.89) Hemiplegia - Right/Left: Left Hemiplegia - dominant/non-dominant: Non-dominant Hemiplegia - caused by: Cerebral infarction     Time: 1130-1205 PT Time Calculation (min) (ACUTE ONLY): 35 min  Charges:  $Therapeutic Activity: 23-37 mins                     Thaila Bottoms, PT, GCS 10/09/18,1:52 PM

## 2018-10-09 NOTE — Progress Notes (Signed)
CBG 69, Ordered Hypoglycemic Protocol and gave 4 ounces juice.   Notified IM MD per Shea Evans

## 2018-10-09 NOTE — Progress Notes (Signed)
  Speech Language Pathology Treatment: Dysphagia  Patient Details Name: Dimitris Shanahan MRN: [id number] DOB: [birth year] Today's Date: 10/09/2018 Time: 6659-9357 SLP Time Calculation (min) (ACUTE ONLY): 15 min  Assessment / Plan / Recommendation Clinical Impression  SLP followed up for diet tolerance. Per nursing pt with good PO consumption at breakfast and lunch. Nursing reports pt has had some delayed coughing following meals. Pt frequently noted to scoot down in bed, altering positioning, likely contributing to delayed coughing. Pt also noted with some distractibility (talking with PO consumption) despite cues. Pt agreeable to honey thick liquids this date. One instance of delayed cough noted. However no overt s/sx of aspiration exhibited. SLP to continue to monitor during acute stay.   HPI HPI: [name] is a 73 year old man with PMH of treated prostate cancer, heart disease, HLD, HTN, schizophrenia, multiple TIA, stroke (6/4 most recent), dementia.  He was found to be seizing in his SNF today and was sent to the ED, when we saw him he was somnolent, likely due to a mix of post ictal state and medications.  GCS 6. Abnormal EEG 7/4.  MRI 7/3: "Expected evolutionary changes of the late subacute infarction in the right MCA territory. Developing volume loss and gliosis. No evidence of any additional extension or new insult. Older infarction in the right parietal region as seen previously."      SLP Plan  Continue with current plan of care       Recommendations  Diet recommendations: Dysphagia 1 (puree);Honey-thick liquid Liquids provided via: Teaspoon;Cup Medication Administration: Crushed with puree Supervision: Staff to assist with self feeding;Full supervision/cueing for compensatory strategies Compensations: Slow rate;Small sips/bites;Follow solids with liquid Postural Changes and/or Swallow Maneuvers: Seated upright 90 degrees                Oral Care Recommendations: Oral care  BID Follow up Recommendations: Skilled Nursing facility SLP Visit Diagnosis: Dysphagia, oropharyngeal phase (R13.12) Plan: Continue with current plan of care       [employer] MA, CCC-SLP Acute Rehabilitation Services 10/09/2018, 4:36 PM

## 2018-10-09 NOTE — Progress Notes (Signed)
Occupational Therapy Treatment Patient Details Name: Ronald [name] MRN: [id number] DOB: 1946/03/14 Today's Date: 10/09/2018    History of present illness Patient is a 73 y/o male presenting to the ED on 09/29/2018 with priamry complaints of Tonic clonic seizure secondary to stroke. CT showed R parietal lobe hypodensity consistent with a subacute stroke. Of note, recent hospital discharge on 09/14/2018 for L MCA stroke for which he received tPA and angioplasty of the R terminal ICA. Past medical history of prostate cancer, dementia, heart disease, hyperlipidemia, HTN, measles, mumps, schizophrenia, multiple TIA, stroke.   OT comments  Pt received supine in bed, agreeable to OT session, but communicating with consistent inappropriate/rude remarks. Session limited secondary to pt's agitation and continuation of inappropriate/rude remarks despite max redirection and discussed necessity of appropriate comments to continue with session. Pt completed grooming after setupA with use of RUE, modA for incorporation of LUE for bilateral coordination during task. Continued to educate pt on importance of mobility of LUE throughout ADL. Pt will continue to benefit from skilled OT services to maximize safety and independence with ADL/IADL and functional mobility. Will continue to follow acutely and progress as tolerated.    Follow Up Recommendations  SNF;Supervision/Assistance - 24 hour    Equipment Recommendations  Other (comment)(defer)    Recommendations for Other Services PT consult;Speech consult    Precautions / Restrictions Precautions Precautions: Fall Restrictions Weight Bearing Restrictions: No       Mobility Bed Mobility Overal bed mobility: Needs Assistance Bed Mobility: Supine to Sit;Sit to Supine     Supine to sit: Max assist;+2 for physical assistance;Total assist Sit to supine: +2 for physical assistance;Total assist;Max assist   General bed mobility comments: deferred due to  safety and pt's cognition   Transfers Overall transfer level: Needs assistance Equipment used: 2 person hand held assist Transfers: Sit to/from Stand           General transfer comment: deferred, unsafe    Balance Overall balance assessment: Needs assistance Sitting-balance support: Single extremity supported;Feet supported Sitting balance-Leahy Scale: Poor Sitting balance - Comments: varying levels of assist needed - up to Max A for upright balance Postural control: Left lateral lean Standing balance support: Single extremity supported Standing balance-Leahy Scale: Fair Standing balance comment: unable to maintain standing for long                           ADL either performed or assessed with clinical judgement   ADL Overall ADL's : Needs assistance/impaired     Grooming: Sitting;Moderate assistance;Set up Grooming Details (indicate cue type and reason): provided pt with wash cloth while sitting upright in bed;modA for bilateral coordination tasks due to decreased functional use of LUE Upper Body Bathing: Maximal assistance;Sitting   Lower Body Bathing: Total assistance                         General ADL Comments: deferred mobility secondary to pt's cognition and safety;completed ADL at bed level     Vision       Perception     Praxis      Cognition Arousal/Alertness: Awake/alert Behavior During Therapy: Agitated Overall Cognitive Status: No family/caregiver present to determine baseline cognitive functioning Area of Impairment: Orientation;Following commands;Safety/judgement;Problem solving;Attention                 Orientation Level: Situation;Place;Disoriented to Current Attention Level: Selective Memory: Decreased short-term memory Following Commands: Follows one step  commands inconsistently;Follows one step commands with increased time Safety/Judgement: Decreased awareness of safety;Decreased awareness of  deficits Awareness: Intellectual Problem Solving: Slow processing;Requires verbal cues;Requires tactile cues;Decreased initiation;Difficulty sequencing General Comments: difficulty understanding pt's speech at times, but more clear this date;pt with rude comments throughout session, discussed rude/inappropriate comments with pt and pt replied with "you worry about yourself" pt continued with rude/inappropriate remarks despite max redirection, pt aware of effects of stroke on LUE and residual weakness        Exercises Exercises: General Upper Extremity General Exercises - Upper Extremity Shoulder Flexion: PROM;10 reps;Seated;Both Shoulder Extension: PROM;Both;10 reps;Seated Elbow Flexion: PROM;10 reps;Seated Elbow Extension: PROM;Both;Seated Wrist Flexion: PROM;Both;10 reps Wrist Extension: PROM;Both;10 reps Digit Composite Flexion: AROM;10 reps Composite Extension: PROM;10 reps   Upper Extremity Assessment  Shoulder Instructions   poor ROM and strength;no digit extension, demonstrated use of RUE to assist with ROM;educated pt on importance of continuing to mobilize and incoorporate LUE into ADL     General Comments VSS on RA;educated pt on importance of use of LUE;limited session secondary to pt's rude remarks despite redirection    Pertinent Vitals/ Pain       Pain Assessment: No/denies pain Faces Pain Scale: No hurt  Home Living Family/patient expects to be discharged to:: Skilled nursing facility     Type of Home: House                       Home Equipment: Gilford Rile - 2 wheels;Cane - single point;Wheelchair - manual;Tub bench          Prior Functioning/Environment Level of Independence: Needs assistance        Comments: resident of SNF prior to admission - unsure of PLOF   Frequency  Min 2X/week        Progress Toward Goals  OT Goals(current goals can now be found in the care plan section)  Progress towards OT goals: Not progressing toward goals -  comment  Acute Rehab OT Goals Patient Stated Goal: none stated OT Goal Formulation: With patient Time For Goal Achievement: 10/17/18 Potential to Achieve Goals: Good ADL Goals Pt Will Perform Grooming: with set-up;with supervision;sitting Pt Will Perform Upper Body Dressing: with min assist Pt Will Perform Lower Body Dressing: with min assist Pt Will Transfer to Toilet: with mod assist Pt Will Perform Toileting - Clothing Manipulation and hygiene: with min guard assist;sit to/from stand;sitting/lateral leans Pt/caregiver will Perform Home Exercise Program: Increased ROM;Increased strength;Left upper extremity;With minimal assist Additional ADL Goal #1: Pt will sustain attention to simple ADL with min cues. Additional ADL Goal #2: Pt will sit EOB independently to complete ADL.  Plan Discharge plan needs to be updated    Co-evaluation                 AM-PAC OT "6 Clicks" Daily Activity     Outcome Measure   Help from another person eating meals?: Total Help from another person taking care of personal grooming?: A Lot Help from another person toileting, which includes using toliet, bedpan, or urinal?: Total Help from another person bathing (including washing, rinsing, drying)?: A Lot Help from another person to put on and taking off regular upper body clothing?: A Lot Help from another person to put on and taking off regular lower body clothing?: Total 6 Click Score: 9    End of Session    OT Visit Diagnosis: Unsteadiness on feet (R26.81);Other abnormalities of gait and mobility (R26.89);Muscle weakness (generalized) (M62.81);Other symptoms and signs  involving cognitive function;Hemiplegia and hemiparesis;Feeding difficulties (R63.3) Hemiplegia - Right/Left: Left Hemiplegia - dominant/non-dominant: Non-Dominant Hemiplegia - caused by: Cerebral infarction   Activity Tolerance Treatment limited secondary to agitation   Patient Left with call bell/phone within reach;in  bed;with bed alarm set   Nurse Communication Mobility status        Time: 7841-2820 OT Time Calculation (min): 14 min  Charges: OT General Charges $OT Visit: 1 Visit OT Treatments $Self Care/Home Management : 8-22 mins  Dorinda Hill OTR/L Acute Rehabilitation Services Office: [place] 10/09/2018, 3:04 PM

## 2018-10-09 NOTE — Progress Notes (Signed)
  Date: 10/09/2018  Patient name: Ronald [name]  Medical record number: [id number]  Date of birth: [birth year]   I have seen and evaluated this patient and I have discussed the plan of care with the house staff. Please see Dr. Redgie Grayer note for complete details. I concur with his findings and plan.     Sid Falcon, MD 10/09/2018, 4:35 PM

## 2018-10-09 NOTE — Discharge Summary (Addendum)
Name: Ronald [name] MRN: [id number] DOB: 1945/12/08 73 y.o. PCP: Colonel Bald, MD  Date of Admission: 09/29/2018  7:22 AM Date of Discharge: 10/16/2018 Attending Physician: Sid Falcon, MD  Discharge Diagnosis: 1.  Focal status epilepticus/epilepsia partialis continua 2.  R parietal subacute stroke 3.  Hypertension  Discharge Medications: Allergies as of 10/13/2018      Reactions   Dust Mite Extract Other (See Comments)   Allergy symptoms   Pollen Extract Other (See Comments)   Allergy symptoms   Rye Grass Flower Pollen Extract [gramineae Pollens] Other (See Comments)   Allergy Symptoms      Medication List    STOP taking these medications   LORazepam 0.5 MG tablet Commonly known as: ATIVAN     TAKE these medications   amLODipine 5 MG tablet Commonly known as: NORVASC Take 1 tablet (5 mg total) by mouth daily. Start taking on: October 14, 2018   aspirin 325 MG EC tablet Take 1 tablet (325 mg total) by mouth daily.   atorvastatin 80 MG tablet Commonly known as: LIPITOR Take 80 mg by mouth daily.   Calcium-Vitamin D 600-200 MG-UNIT tablet Take 1 tablet by mouth daily.   clopidogrel 75 MG tablet Commonly known as: PLAVIX Take 1 tablet (75 mg total) by mouth daily.   divalproex 125 MG capsule Commonly known as: DEPAKOTE SPRINKLE Take 8 capsules (1,000 mg total) by mouth every 12 (twelve) hours. What changed:   how much to take  when to take this   feeding supplement (ENSURE ENLIVE) Liqd Take 237 mLs by mouth 2 (two) times daily between meals.   fluticasone 50 MCG/ACT nasal spray Commonly known as: FLONASE Place 1 spray into both nostrils daily.   Lacosamide 150 MG Tabs Take 1 tablet (150 mg total) by mouth 2 (two) times daily.   levETIRAcetam 100 MG/ML solution Commonly known as: KEPPRA Take 15 mLs (1,500 mg total) by mouth 2 (two) times daily.   lisinopril 20 MG tablet Commonly known as: ZESTRIL Take 1 tablet (20 mg total) by mouth  daily. Start taking on: October 14, 2018 What changed:   medication strength  how much to take   metoprolol tartrate 50 MG tablet Commonly known as: LOPRESSOR Take 1 tablet (50 mg total) by mouth 2 (two) times daily.   multivitamin with minerals Tabs tablet Take 1 tablet by mouth daily. Start taking on: October 14, 2018   Vitamin D (Cholecalciferol) 50 MCG (2000 UT) Caps Take 2,000 Units by mouth daily.      Disposition and follow-up:   Ronald [name] was [facility] from [facility] in [facility] condition.  At the hospital follow up visit please address:  1.  Focal status epilepticus/epilepsy partialis continua: Initially presented with full body convulsions at nursing home.  Was stabilized in the hospital then given Keppra, Vimpat, and Depakote for seizure management.  Blood levels were within therapeutic ranges and patient was no longer seizing.  2.  R parietal subacute stroke: Previous hospitalization in mid June the patient sustained a right parietal stroke that resulted in left-sided deficits and severe dysarthria.  MRI on recent hospitalization favored postictal change which lowered suspicion for an additional stroke.  3.  Hypertension: BP elevated through most of the stay, however we were not aggressive with treatment due to recent stroke. Managed on home metoprolol 50mg  BID and lisinopril 20mg  daily. Received infrequent PRN Hydralazine 5mg  for pressures >180.  2.  Labs / imaging needed at time of follow-up:  BMP   3.  Pending labs/ test needing follow-up: none  Follow-up Appointments:  Patient's sister was encouraged to schedule follow-up appointment with PCP within 1 to 2 weeks of discharge. She is the main caretaker.  Hospital Course by problem list: 1.  Focal status epilepticus/epilepsy partialis continua 2.  R parietal subacute stroke: The patient initially presented with altered mental status and seizure-like activity at nursing home, he was given  Versed and transported to the hospital where he resumed seizure-like activity. He was given Ativan with resolution of seizures.  Also obtained CT head which showed no evidence of acute bleeds.  Also, MRI brain revealed some late subacute infarction of the right MCA territory.  No evidence of new stroke.  Neurology was consulted and made antiepileptic recommendations.  Medications were titrated with the guidance of EEG monitoring with resolution of seizure-like activity.  We treated with IV fluids and electrolyte repletion.  Patient's mental status returned to baseline shortly thereafter. The patient was stable for several days prior to discharge. -Keppra 1500 mg twice daily -Vimpat 150 mg twice daily -Depakote 1 g ER every 12 hours  3.  Hypertension: Blood pressures were not aggressively treated due to recency of stroke.  - Continued metoprolol 50 p.o. twice daily - Increase home lisinopril 10mg  to 20mg  p.o. daily - PRN hydralazine was given for blood pressures greater than 678 systolic on rare occurrences  Discharge Vitals:   BP (!) 180/65 (BP Location: Left Leg)   Pulse 66   Temp 98.1 F (36.7 C) (Oral)   Resp 16   Ht 5\' 8"  (1.727 m)   Wt 94.6 kg   SpO2 99%   BMI 31.71 kg/m   Pertinent Labs, Studies, and Procedures:  CT head: IMPRESSION: 1. Stable non-contrast CT appearance of the brain. Chronic right parietal lobe encephalomalacia. 2.  No acute cervical spine fracture.  CT cervical spine WO contrast: IMPRESSION: 1. Stable non-contrast CT appearance of the brain. Chronic right parietal lobe encephalomalacia. 2.  No acute cervical spine fracture  MRI brain: IMPRESSION: Expected evolutionary changes of the late subacute infarction in the right MCA territory. Developing volume loss and gliosis. No evidence of any additional extension or new insult. 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  CXR: IMPRESSION: No acute cardiopulmonary findings.  CBC  Latest Ref Rng & Units 10/09/2018 10/05/2018 10/04/2018  WBC 4.0 - 10.5 K/uL 6.1 5.5 6.6  Hemoglobin 13.0 - 17.0 g/dL 11.7(L) 12.6(L) 12.4(L)  Hematocrit 39.0 - 52.0 % 38.3(L) 39.8 38.7(L)  Platelets 150 - 400 K/uL 264 232 223   BMP Latest Ref Rng & Units 10/09/2018 10/06/2018 10/05/2018  Glucose 70 - 99 mg/dL 86 84 137(H)  BUN 8 - 23 mg/dL 5(L) 5(L) 5(L)  Creatinine 0.61 - 1.24 mg/dL 0.73 0.56(L) 0.60(L)  Sodium 135 - 145 mmol/L 142 143 141  Potassium 3.5 - 5.1 mmol/L 3.8 3.7 3.4(L)  Chloride 98 - 111 mmol/L 108 110 105  CO2 22 - 32 mmol/L 26 24 27   Calcium 8.9 - 10.3 mg/dL 8.5(L) 8.3(L) 8.8(L)    Discharge Instructions:   Thank you for allowing [place] to take care of you during her hospitalization. Below is a summary of what we treated:   1. Seizures: We started you on 3 different antiseizure medicines  - Keppra 100 mg twice a day  - Vimpat 150 mg twice a day  - Depakote 1000 mg twice a day   2. Hypertension: We made some slight changes to your  blood pressure regimen.  - We increased her lisinopril to 20 mg daily from 10 mg daily.  - Amlodipine 5 mg daily   If you have any questions or concerns please feel free to call or message [place].   Signed: [name], MD Internal Medicine, PGY1 Pager: [phone]  10/16/2018,1:05 PM

## 2018-10-10 LAB — GLUCOSE, CAPILLARY
Glucose-Capillary: 72 mg/dL (ref 70–99)
Glucose-Capillary: 73 mg/dL (ref 70–99)
Glucose-Capillary: 81 mg/dL (ref 70–99)
Glucose-Capillary: 82 mg/dL (ref 70–99)
Glucose-Capillary: 82 mg/dL (ref 70–99)
Glucose-Capillary: 85 mg/dL (ref 70–99)

## 2018-10-10 MED ORDER — AMLODIPINE BESYLATE 5 MG PO TABS
5.0000 mg | ORAL_TABLET | Freq: Every day | ORAL | Status: DC
  Administered 2018-10-10 – 2018-10-16 (×6): 5 mg via ORAL
  Filled 2018-10-10 (×7): qty 1

## 2018-10-10 MED ORDER — HYDRALAZINE HCL 20 MG/ML IJ SOLN
5.0000 mg | Freq: Once | INTRAMUSCULAR | Status: AC
  Administered 2018-10-10: 5 mg via INTRAVENOUS
  Filled 2018-10-10: qty 1

## 2018-10-10 NOTE — Progress Notes (Signed)
Subjective: [name] was seen on morning rounds with the team today.  Resting comfortably in bed.  No complaints this morning.  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  Objective:  Vital signs in last 24 hours: Vitals:   10/10/18 0528 10/10/18 0624 10/10/18 1000 10/10/18 1235  BP: (!) 215/81 (!) 205/72 (!) 203/81 (!) 177/69  Pulse: 68  90 71  Resp: 16   16  Temp: 98.1 F (36.7 C)   98.5 F (36.9 C)  TempSrc:      SpO2: 100%  97% 97%  Weight:      Height:       Physical Exam Vitals signs reviewed.  Constitutional:      General: He is not in acute distress.    Appearance: He is normal weight. He is not ill-appearing, toxic-appearing or diaphoretic.  HENT:     Head: Normocephalic and atraumatic.  Eyes:     General: No scleral icterus.    Extraocular Movements: Extraocular movements intact.  Cardiovascular:     Rate and Rhythm: Normal rate and regular rhythm.     Pulses: Normal pulses.     Heart sounds: Normal heart sounds. No murmur. No friction rub. No gallop.   Pulmonary:     Effort: Pulmonary effort is normal. No respiratory distress.     Breath sounds: Normal breath sounds. No wheezing or rales.  Abdominal:     General: Abdomen is flat. Bowel sounds are normal. There is no distension.     Palpations: Abdomen is soft.     Tenderness: There is no abdominal tenderness. There is no guarding.  Musculoskeletal:        General: No swelling or deformity.     Comments: Spontaneously moves all extremities.  Left-sided deficits secondary to stroke complications.  Skin:    General: Skin is warm.  Neurological:     Mental Status: He is alert and oriented to person, place, and time. Mental status is at baseline.  Psychiatric:        Mood and Affect: Mood normal.     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 Alfieri is a 73 y.o male with a PMHx significant for prostate cancer, dementia, heart disease, hyperlipidemia, HTN, measles, mumps, schizophrenia, multiple TIA, and stroke who presented today with tonic clonic seizures, suspected to besecondary to a R sided subacute strokein mid Juneconfirmed byhead imaging.CT headshowed no acute bleedsand MRIshowed subacute stroke with no new findings. The patient's sister received clearance to be in the hospital.The sister says Mr. Mani appears to be at his baseline.   Of note, with the assistance of his sister, Mr. Sanjuan shared that he wen to [employer] for college. He went to school with the late astronaut Celene Skeen, who was his best friend. He played saxophone and piano, and was a band Insurance account manager.  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 The first siezure at the nursing home was resolved with 5mg  of Versed once EMS arrived. The patient seized again in the ED where 5mg totalof Ativan and 2000 mgofIV Keppra were administered. Subsequent EEG monitoring did not reveal seizure like activity.Of note, the patient is having involuntary LUE low amplitude jerks.Neurology evaluated andthinks  the pt is having EPC.LUE no longer jerking. Neuro recsare as follows: - Keppra1500 mgBID -Vimpat150 mgBID - Depakote1gERQ12H -Neurology signed off on 7/12  #FEN/GI #Hypokalemia #Hypomagnemesia - Repleted -Aspirtation risk,SLP recsdysphagia 1(puree); honey-thick liquids via teaspoon.   #R parietal subacute stroke #Dysarthria:Much improved today. Can understand what the patient is saying much better than previous days. The findings on CT headand MRIconsistent with previousRMCA stroke w/ L sided deficits that  are unchanged.  - Atorvastatin 81 mg daily -Plavix 74mg  daily - Lovenox 40mg  injection daily - Aspirin 325 mg daily  #HTN -Metoprolol50mg  PO BID - Lisinopril 20mg  PO daily -PRN hydralazine for BP greater than 176 systolic  Dispo: Anticipated dischargedependent on SNF placement. Per SW, Pt has been accepted to facility in [place], [place], pending negative COVID test. Anticipated discharge tomorrow or Thursday.   [name], MD Internal Medicine, PGY1 Pager: [phone]  10/10/2018,3:28 PM

## 2018-10-10 NOTE — TOC Progression Note (Signed)
Transition of Care [facility]) - Progression Note    Patient Details  Name: Ronald [name] MRN: [id number] Date of Birth: 1946/01/12  Transition of Care [facility]) CM/SW [place], LCSW Phone Number: 10/10/2018, 10:33 AM  Clinical Narrative:    CSW continuing SNF bed search. No beds available yet.    Expected Discharge Plan: [place] Barriers to Discharge: Continued Medical Work up  Expected Discharge Plan and Services Expected Discharge Plan: [place] Choice: [place] arrangements for the past 2 months: Single Family Home, [place]                                       Social Determinants of Health (SDOH) Interventions    Readmission Risk Interventions No flowsheet data found.

## 2018-10-10 NOTE — Progress Notes (Signed)
Patient BP 215/81 at 0528.  5 mg of hydralazine given at 0530.  BP 205/72 at 0624.  Notified the MD.  Will continue the monitor the patient and notify as needed

## 2018-10-10 NOTE — Progress Notes (Signed)
Pt's sister updated with POC. All questions/concerns addressed. Will continue to monitor.

## 2018-10-10 NOTE — TOC Progression Note (Addendum)
Transition of Care [facility]) - Progression Note    Patient Details  Name: Ronald [name] MRN: [id number] Date of Birth: 1945-06-27  Transition of Care [facility]) CM/SW [place], LCSW Phone Number: 10/10/2018, 2:32 PM  Clinical Narrative:    2:40pm-CSW alerted sister that one additional SNF is able to accept patient. Patient's sister expressing being grateful and accepts the bed offer at [facility]. They will begin insurance authorization. CSW requested an updated COVID test.   2pm-CSW spoke with patient's sister to let her know that only available SNF is [facility]. She states she will contact CSW back.    Expected Discharge Plan: [place] Barriers to Discharge: Continued Medical Work up  Expected Discharge Plan and Services Expected Discharge Plan: [place] Choice: [place] arrangements for the past 2 months: Single Family Home, [place]                                       Social Determinants of Health (SDOH) Interventions    Readmission Risk Interventions No flowsheet data found.

## 2018-10-10 NOTE — Progress Notes (Signed)
  Date: 10/10/2018  Patient name: Ronald [name]  Medical record number: [id number]  Date of birth: 10-23-1945   I have seen and evaluated this patient and I have discussed the plan of care with the house staff. Please see Dr. Redgie Grayer note for complete details. I concur with his findings and plan.  [name] is medically optimized for discharge once an accepting facility is available.  SARS-CoV updated test will be sent today.   Sid Falcon, MD 10/10/2018, 8:21 PM

## 2018-10-11 LAB — GLUCOSE, CAPILLARY
Glucose-Capillary: 107 mg/dL — ABNORMAL HIGH (ref 70–99)
Glucose-Capillary: 145 mg/dL — ABNORMAL HIGH (ref 70–99)
Glucose-Capillary: 67 mg/dL — ABNORMAL LOW (ref 70–99)
Glucose-Capillary: 69 mg/dL — ABNORMAL LOW (ref 70–99)
Glucose-Capillary: 77 mg/dL (ref 70–99)
Glucose-Capillary: 94 mg/dL (ref 70–99)
Glucose-Capillary: 98 mg/dL (ref 70–99)

## 2018-10-11 NOTE — Progress Notes (Signed)
10/11/18 1744  PT Visit Information  Last PT Received On 10/11/18  Assistance Needed +2  PT/OT/SLP Co-Evaluation/Treatment Yes  Reason for Co-Treatment Complexity of the patient's impairments (multi-system involvement);Necessary to address cognition/behavior during functional activity;For patient/therapist safety;To address functional/ADL transfers  PT goals addressed during session Mobility/safety with mobility;Balance  History of Present Illness Patient is a 73 y/o male presenting to the ED on 09/29/2018 with priamry complaints of Tonic clonic seizure secondary to stroke. CT showed R parietal lobe hypodensity consistent with a subacute stroke. Of note, recent hospital discharge on 09/14/2018 for L MCA stroke for which he received tPA and angioplasty of the R terminal ICA. Past medical history of prostate cancer, dementia, heart disease, hyperlipidemia, HTN, measles, mumps, schizophrenia, multiple TIA, stroke.  Subjective Data  Patient Stated Goal none stated  Precautions  Precautions Fall  Restrictions  Weight Bearing Restrictions No  Pain Assessment  Pain Assessment Faces  Faces Pain Scale 0  Cognition  Arousal/Alertness Awake/alert  Behavior During Therapy Agitated  Overall Cognitive Status No family/caregiver present to determine baseline cognitive functioning  Area of Impairment Orientation;Following commands;Safety/judgement;Problem solving;Attention  Orientation Level Situation;Place;Disoriented to  Current Attention Level Selective  Memory Decreased short-term memory  Following Commands Follows one step commands inconsistently;Follows one step commands with increased time  Safety/Judgement Decreased awareness of safety;Decreased awareness of deficits  Awareness Intellectual  Problem Solving Slow processing;Requires verbal cues;Requires tactile cues;Decreased initiation;Difficulty sequencing  General Comments Pt throughout session waxed and wained throughout session from  threatening to call the national guard and put therapists "68ft under" to thanking them and calling us angels. Pt speech mumbling and hard to understand most of the time  Bed Mobility  Overal bed mobility Needs Assistance  Bed Mobility Supine to Sit;Sit to Supine;Rolling  Rolling Max assist;Total assist;+2 for physical assistance  Supine to sit Max assist;+2 for physical assistance;Total assist  Sit to supine +2 for physical assistance;Total assist;Max assist  General bed mobility comments Initially Pt agreeable to sit EOB, but pushed backwards the whole time in sitting. Max to total +2 for rolling for clean up following BM.   Transfers  General transfer comment deferred, unsafe - even with Stedy  Balance  Overall balance assessment Needs assistance  Sitting-balance support Single extremity supported;Feet supported  Sitting balance-Leahy Scale Zero  Sitting balance - Comments max A throughout despite multimodal cues  Postural control Left lateral lean;Posterior lean  PT - End of Session  Activity Tolerance Treatment limited secondary to agitation  Patient left in bed;with bed alarm set;with call bell/phone within reach  Nurse Communication Mobility status   PT - Assessment/Plan  PT Plan Current plan remains appropriate  PT Visit Diagnosis Muscle weakness (generalized) (M62.81);Unsteadiness on feet (R26.81);Other abnormalities of gait and mobility (R26.89)  Hemiplegia - Right/Left Left  Hemiplegia - dominant/non-dominant Non-dominant  Hemiplegia - caused by Cerebral infarction  PT Frequency (ACUTE ONLY) Min 2X/week  Follow Up Recommendations SNF;Supervision/Assistance - 24 hour  PT equipment None recommended by PT  AM-PAC PT "6 Clicks" Mobility Outcome Measure (Version 2)  Help needed turning from your back to your side while in a flat bed without using bedrails? 1  Help needed moving from lying on your back to sitting on the side of a flat bed without using bedrails? 1  Help needed  moving to and from a bed to a chair (including a wheelchair)? 1  Help needed standing up from a chair using your arms (e.g., wheelchair or bedside chair)? 1  Help needed to  walk in hospital room? 1  Help needed climbing 3-5 steps with a railing?  1  6 Click Score 6  Consider Recommendation of Discharge To: CIR/SNF/LTACH  PT Goal Progression  Progress towards PT goals Progressing toward goals (slowly )  Acute Rehab PT Goals  PT Goal Formulation With patient  Time For Goal Achievement 10/17/18  Potential to Achieve Goals Fair  PT Time Calculation  PT Start Time (ACUTE ONLY) 1540  PT Stop Time (ACUTE ONLY) 1603  PT Time Calculation (min) (ACUTE ONLY) 23 min  PT General Charges  $$ ACUTE PT VISIT 1 Visit  PT Treatments  $Therapeutic Activity 8-22 mins   Pt with intermittent confusion throughout session, agitation limiting session. Pt at some points calling therapists angels, and then other times threatening to call the national guard on therapists. Overall resistant to any therapy that involved movement despite communicating goals of session, movement before it happened, and attempts to build rapport. Pt was max A +2 to total A +2 for bed mobility, and demonstrated heavy posterior and L lateral lean. When cleaning pt following BM, pt with increased agitation as well. Current recommendations appropriate. Will continue to follow acutely to maximize functional mobility independence and safety.   Leighton Ruff, PT, DPT  Acute Rehabilitation Services  Pager: [phone] Office: [phone]
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
## 2018-10-11 NOTE — Progress Notes (Signed)
  Date: 10/11/2018  Patient name: Ronald [name]  Medical record number: [id number]  Date of birth: August 28, 1945   I have seen and evaluated this patient and I have discussed the plan of care with the house staff. Please see Dr. Redgie Grayer note for complete details. I concur with his findings and plan.    Sid Falcon, MD 10/11/2018, 6:04 PM

## 2018-10-11 NOTE — Progress Notes (Signed)
Occupational Therapy Treatment Patient Details Name: Ronald [name] MRN: [id number] DOB: [birth year] Today's Date: 10/11/2018    History of present illness Patient is a 73 y/o male presenting to the ED on 09/29/2018 with priamry complaints of Tonic clonic seizure secondary to stroke. CT showed R parietal lobe hypodensity consistent with a subacute stroke. Of note, recent hospital discharge on 09/14/2018 for L MCA stroke for which he received tPA and angioplasty of the R terminal ICA. Past medical history of prostate cancer, dementia, heart disease, hyperlipidemia, HTN, measles, mumps, schizophrenia, multiple TIA, stroke.   OT comments  Pt with intermittent confusion throughout session, agitation limiting session. Pt at some points calling therapists angels, and then other times threatening to call the national guard on therapists. Overall resistant to any therapy that involved movement despite communicating goals of session, movement before it happened, and attempts to build rapport. Pt was max A +2 to total A +2 for bed mobility. Pt does hold onto rails in attempts to assist. Once in sitting Pt with very strong posterior and left lateral lean requiring max A to maintain upright. LUE does not assist throughout session despite multimodal cues for engaging it. Noticed Pt had BM up his back - so returned supine. While attempting to provide peri care is when Pt became most agitated despite explanations and offers to let him do it himself "I didn't poop, just let me lay here" when therapy team cleaned and changed out bed pad Pt was most irritated. At end of session, Pt reported being comfortable in the bed , LUE propped up on pillow for edema management. Current POC remains appropriate - for Pt to get post-acute OT at SNF.    Follow Up Recommendations  SNF;Supervision/Assistance - 24 hour    Equipment Recommendations  Other (comment)(defer to next venue of care)    Recommendations for Other Services PT  consult;Speech consult    Precautions / Restrictions Precautions Precautions: Fall Restrictions Weight Bearing Restrictions: No       Mobility Bed Mobility Overal bed mobility: Needs Assistance Bed Mobility: Supine to Sit;Sit to Supine     Supine to sit: Max assist;+2 for physical assistance;Total assist Sit to supine: +2 for physical assistance;Total assist;Max assist   General bed mobility comments: Initially Pt agreeable to sit EOB, but pushed backwards the whole time,   Transfers                 General transfer comment: deferred, unsafe - even with Stedy    Balance Overall balance assessment: Needs assistance Sitting-balance support: Single extremity supported;Feet supported Sitting balance-Leahy Scale: Zero Sitting balance - Comments: max A throughout despite multimodal cues Postural control: Left lateral lean;Posterior lean                                 ADL either performed or assessed with clinical judgement   ADL Overall ADL's : Needs assistance/impaired                         Toilet Transfer: Maximal assistance;+2 for physical assistance;+2 for safety/equipment   Toileting- Clothing Manipulation and Hygiene: Total assistance;+2 for physical assistance;+2 for safety/equipment;Bed level         General ADL Comments: deferred mobility (even with lift equipment) due to agitation     Vision       Perception     Praxis      Cognition  Arousal/Alertness: Awake/alert Behavior During Therapy: Agitated Overall Cognitive Status: No family/caregiver present to determine baseline cognitive functioning Area of Impairment: Orientation;Following commands;Safety/judgement;Problem solving;Attention                 Orientation Level: Situation;Place;Disoriented to Current Attention Level: Selective Memory: Decreased short-term memory Following Commands: Follows one step commands inconsistently;Follows one step commands  with increased time Safety/Judgement: Decreased awareness of safety;Decreased awareness of deficits Awareness: Intellectual Problem Solving: Slow processing;Requires verbal cues;Requires tactile cues;Decreased initiation;Difficulty sequencing General Comments: Pt throughout session waxed and wained throughout session from threatening to call the national guard and put therapists "68ft under" to thanking them and calling us angels. Pt speech mumbling and hard to understand most of the time        Exercises     Shoulder Instructions       General Comments      Pertinent Vitals/ Pain       Pain Assessment: No/denies pain Faces Pain Scale: No hurt Pain Intervention(s): Monitored during session;Repositioned  Home Living                                          Prior Functioning/Environment              Frequency  Min 2X/week        Progress Toward Goals  OT Goals(current goals can now be found in the care plan section)  Progress towards OT goals: Not progressing toward goals - comment(limited by agitation)  Acute Rehab OT Goals Patient Stated Goal: none stated OT Goal Formulation: With patient Time For Goal Achievement: 10/17/18 Potential to Achieve Goals: Good  Plan Discharge plan remains appropriate;Frequency remains appropriate    Co-evaluation    PT/OT/SLP Co-Evaluation/Treatment: Yes Reason for Co-Treatment: Complexity of the patient's impairments (multi-system involvement);Necessary to address cognition/behavior during functional activity;For patient/therapist safety;To address functional/ADL transfers PT goals addressed during session: Mobility/safety with mobility;Balance OT goals addressed during session: ADL's and self-care      AM-PAC OT "6 Clicks" Daily Activity     Outcome Measure   Help from another person eating meals?: A Lot Help from another person taking care of personal grooming?: A Lot Help from another person toileting,  which includes using toliet, bedpan, or urinal?: Total Help from another person bathing (including washing, rinsing, drying)?: A Lot Help from another person to put on and taking off regular upper body clothing?: A Lot Help from another person to put on and taking off regular lower body clothing?: Total 6 Click Score: 10    End of Session    OT Visit Diagnosis: Unsteadiness on feet (R26.81);Other abnormalities of gait and mobility (R26.89);Muscle weakness (generalized) (M62.81);Other symptoms and signs involving cognitive function;Hemiplegia and hemiparesis;Feeding difficulties (R63.3) Hemiplegia - Right/Left: Left Hemiplegia - dominant/non-dominant: Non-Dominant Hemiplegia - caused by: Cerebral infarction   Activity Tolerance Treatment limited secondary to agitation   Patient Left with call bell/phone within reach;in bed;with bed alarm set   Nurse Communication Mobility status;Other (comment)(cleaned up BM, confusion)        Time: 8295-6213 OT Time Calculation (min): 23 min  Charges: OT General Charges $OT Visit: 1 Visit OT Treatments $Self Care/Home Management : 8-22 mins  Hulda Humphrey OTR/L Acute Rehabilitation Services Pager: [phone] Office: [place] 10/11/2018, 4:57 PM

## 2018-10-11 NOTE — Progress Notes (Signed)
Subjective: Reports doing well. Continues to go off on tangents.. Talks about going to [employer] for college and recently talked to his brother. Aware he is no longer having seizures and very appreciative.Marland Kitchen He remember falling in the past during his seizures. No complaints right now.  Objective:  Vital signs in last 24 hours: Vitals:   10/10/18 1235 10/10/18 2218 10/11/18 0117 10/11/18 0558  BP: (!) 177/69 (!) 181/74 (!) 162/67 (!) 182/71  Pulse: 71 81 66 74  Resp: 16   18  Temp: 98.5 F (36.9 C) 98.6 F (37 C)  98.9 F (37.2 C)  TempSrc:  Oral  Axillary  SpO2: 97% 99%    Weight:    95 kg  Height:       Physical Exam Constitutional:      General: He is not in acute distress.    Appearance: He is normal weight. He is not ill-appearing, toxic-appearing or diaphoretic.  HENT:     Head: Normocephalic and atraumatic.     Mouth/Throat:     Mouth: Mucous membranes are moist.  Eyes:     General: No scleral icterus.       Right eye: No discharge.        Left eye: No discharge.     Extraocular Movements: Extraocular movements intact.  Cardiovascular:     Rate and Rhythm: Normal rate and regular rhythm.     Pulses: Normal pulses.     Heart sounds: Normal heart sounds. No murmur. No friction rub. No gallop.   Pulmonary:     Effort: Pulmonary effort is normal. No respiratory distress.     Breath sounds: Normal breath sounds. No wheezing or rales.  Abdominal:     General: Abdomen is flat. Bowel sounds are normal. There is no distension.     Palpations: Abdomen is soft.     Tenderness: There is no abdominal tenderness. There is no guarding.  Musculoskeletal:        General: No swelling or deformity.  Skin:    General: Skin is warm.     Coloration: Skin is not jaundiced.  Neurological:     Mental Status: He is alert. Mental status is at baseline.     Comments: Dysarthria     Assessment/Plan:  Principal Problem:   Seizure ([place]) Active Problems:   Seizure-like activity  ([place])   Dysphagia  In summary, Mr. Kops is a 73 y.o male with a PMHx significant for prostate cancer, dementia, heart disease, hyperlipidemia, HTN, measles, mumps, schizophrenia, multiple TIA, and stroke who presented today with tonic clonic seizures, suspected to besecondary to a R sided subacute strokein mid Juneconfirmed byhead imaging.CT headshowed no acute bleedsand MRIshowed subacute stroke with no new findings. The patient's sister received clearance to be in the hospital.The sister says Mr. Laura appears to be at his baseline. Accepted for SNF placement pending negative COVID test.  Of note, with the assistance of his sister, Mr. Tullier shared that he wen to [employer] for college. He went to school with the late astronaut Celene Skeen, who was his best friend. He played saxophone and piano, and was a band Insurance account manager.  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 The first siezure at the nursing home was resolved with 5mg  of Versed once EMS arrived. The patient seized again in the ED where 5mg totalof Ativan and 2000 mgofIV Keppra were administered.  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 Neuro recsare as follows: - Keppra1500 mgBID -Vimpat150 mgBID - Depakote1gERQ12H -Neurology signedoff on 7/12  #FEN/GI #Hypokalemia #Hypomagnemesia - Repleted -Aspirtation risk,SLP recsdysphagia 1(puree); honey-thick liquids via teaspoon.   #R parietal subacute stroke #Dysarthria:Can understand what the patient is saying more today than previous. The findings on CT headand MRIconsistent with previousRMCA stroke w/ L sided  deficits that are unchanged.  - Atorvastatin 81 mg daily -Plavix 74mg  daily - Lovenox 40mg  injection daily - Aspirin 325 mg daily  #HTN -Metoprolol50mg  PO BID - Lisinopril 20mg  PO daily -PRN hydralazine for BP greater than 768 systolic (received x2 in last 24 hrs)  Dispo: Anticipated dischargedependent on SNF placement. Per SW, Pt has been accepted to facility in [facility], [place], pending negative COVID test. Anticipated discharge today or tomorrow.

## 2018-10-11 NOTE — Plan of Care (Signed)
  Problem: Medication: Goal: Risk for medication side effects will decrease Outcome: Progressing   Problem: Clinical Measurements: Goal: Complications related to the disease process, condition or treatment will be avoided or minimized Outcome: Progressing   Problem: Safety: Goal: Verbalization of understanding the information provided will improve Outcome: Progressing   Problem: Clinical Measurements: Goal: Will remain free from infection Outcome: Progressing Goal: Respiratory complications will improve Outcome: Progressing Goal: Cardiovascular complication will be avoided Outcome: Progressing   Problem: Coping: Goal: Level of anxiety will decrease Outcome: Progressing

## 2018-10-12 LAB — GLUCOSE, CAPILLARY
Glucose-Capillary: 100 mg/dL — ABNORMAL HIGH (ref 70–99)
Glucose-Capillary: 72 mg/dL (ref 70–99)
Glucose-Capillary: 73 mg/dL (ref 70–99)
Glucose-Capillary: 82 mg/dL (ref 70–99)
Glucose-Capillary: 92 mg/dL (ref 70–99)

## 2018-10-12 LAB — NOVEL CORONAVIRUS, NAA (HOSP ORDER, SEND-OUT TO REF LAB; TAT 18-24 HRS): SARS-CoV-2, NAA: NOT DETECTED

## 2018-10-12 NOTE — Care Management Important Message (Signed)
Important Message  Patient Details  Name: Ronald [name] MRN: [id number] Date of Birth: 10/07/1945   Medicare Important Message Given:  Yes     Memory Argue 10/12/2018, 4:35 PM

## 2018-10-12 NOTE — Progress Notes (Signed)
Attempted to call pt's sister Irean Hong. Updated her on plans for placement and on speech's plan for MBSS tomorrow. Sister grateful for call.

## 2018-10-12 NOTE — Progress Notes (Signed)
  Date: 10/12/2018  Patient name: Ronald [name]  Medical record number: [id number]  Date of birth: April 09, 1945   I have seen and evaluated this patient and I have discussed the plan of care with the house staff. Please see Dr. Redgie Grayer note for complete details. I concur with his findings and plan.   Ronald [name] is ready for discharge tomorrow.  His SARs-CoV-2 test is negative.  He should be discharged after swallowing study.  If this study is not able to be done tomorrow, it should not delay his discharge.   Sid Falcon, MD 10/12/2018, 6:47 PM

## 2018-10-12 NOTE — Progress Notes (Signed)
Nutrition Follow-up  DOCUMENTATION CODES:   Obesity unspecified  INTERVENTION:  -Continue MVI with minerals daily -Continue magic cup TID with meals, each supplement provides 290 kcal and 9 grams of protein -Continue greek yogurt with each breakfast tray -Continue pudding with lunch and dinner trays -Continue feeding assistance with meals   NUTRITION DIAGNOSIS:   Inadequate oral intake related to lethargy/confusion, dysphagia as evidenced by meal completion < 25%.  progressing  GOAL:   Patient will meet greater than or equal to 90% of their needs  progressing  MONITOR:   PO intake, Supplement acceptance, Diet advancement, Labs, Weight trends, Skin, I & O's  REASON FOR ASSESSMENT:   Consult Enteral/tube feeding initiation and management  ASSESSMENT:   Mr. Greenslade is a 73 y.o male with a PMHx significant of prostate cancer, dementia, heart disease, hyperlipidemia, HTN, measles, mumps, schizophrenia, multiple TIA, stroke (most recent R MCA infarct w/ L sided deficits on 6/4) who lives in a SNF who presents to the hospital for a tonic clonic seizure. He was initially found on the floor by nurses showing seizure like activity. Per nursing, the patient's sister says he seized for about 30 minutes prior till EMS arrival. He was given 5 mg of Versed IM once EMS arrived which ended the seizure like activity. When he arrived at the ED, he started having seizure like activity again, exhibiting left sided tonic-clonic jerks. He also had a leftward deviated gaze.  He was given 5mg  total of Ativan and 2000 mg IV Keppra load. Neurology also evaluated and subsequently performed an EEG which did not show any more seizure activity.  7/6- cortrak tube placed, pt pulled out, tube placement on hold 7/7- s/p BSE- advanced to dysphagia 1 diet with honey thick liquids  Per chart review; pt reports doing well; he is alert and conversational this morning.  Pt with anticipated discharge to SNF in  [facility], pending negative Covid test.  Patient PO decreased over the past few days with 45% of breakfast this morning. Overall, PO has been averages 67% of the last 5 recorded meals  Weights reviewed - stable Diet Order:   Diet Order            DIET - DYS 1 Room service appropriate? Yes; Fluid consistency: Honey Thick  Diet effective now              EDUCATION NEEDS:   No education needs have been identified at this time  Skin:  Skin Assessment: Reviewed RN Assessment Stage I: -  Last BM:  10/04/18  Height:   Ht Readings from Last 1 Encounters:  09/29/18 5\' 8"  (1.727 m)    Weight:   Wt Readings from Last 1 Encounters:  10/11/18 95 kg    Ideal Body Weight:  70 kg  BMI:  Body mass index is 31.84 kg/m.  Estimated Nutritional Needs:   Kcal:  6294-7654  Protein:  90-105 grams  Fluid:  > 1.7 L  Lajuan Lines, RD, LDN  After Hours/Weekend Pager: [phone]

## 2018-10-12 NOTE — Progress Notes (Addendum)
  Speech Language Pathology Treatment: Dysphagia  Patient Details Name: Ronald [name] MRN: [id number] DOB: Dec 09, 1945 Today's Date: 10/12/2018 Time: 2248-2500 SLP Time Calculation (min) (ACUTE ONLY): 17 min  Assessment / Plan / Recommendation Clinical Impression  Pt much more alert today and speech intelligibility has greatly improved; although speech content was frequently tangential.  Pt initially tolerated thin liquid by cup and straw with no clinical s/s of aspiration.  Pt declined puree at first but was agreeable to solid trials.  During solid trial, pt requested a small amount of puree to help moisten solid, which appeared to be a beneficial strategy to achieve oral clearance.  On subsequent trials, immediate strong cough was noted, and pt endorsed getting choked on graham cracker.  Coughing was later noted with sips of thin liquid as well. Pt was noted to talk almost continuously during PO trials.  Pt was only moderately receptive to cues to swallow before talking. Recommend instrumental swallow evaluation prior to advancing diet.  Pt appears able to participate in MBS at this time and was agreeable to instrumental study.    HPI HPI: [name] is a 73 year old man with PMH of treated prostate cancer, heart disease, HLD, HTN, schizophrenia, multiple TIA, stroke (6/4 most recent), dementia. He was found to be seizing in his SNF today and was sent to the ED, when we saw him he was somnolent, likely due to a mix of post ictal state and medications. GCS 6. Abnormal EEG 7/4. MRI 7/3: "Expected evolutionary changes of the late subacute infarction in the right MCA territory. Developing volume loss and gliosis. No evidence of any additional extension or new insult. Older infarction in the right parietal region as seen previously."      SLP Plan  Continue with current plan of care       Recommendations  Diet recommendations: Dysphagia 1 (puree);Honey-thick liquid Liquids provided via:  Teaspoon;Cup Medication Administration: Crushed with puree Supervision: Staff to assist with self feeding;Full supervision/cueing for compensatory strategies Compensations: Slow rate;Small sips/bites;Follow solids with liquid(Encourage pt not to talk while eating) Postural Changes and/or Swallow Maneuvers: Seated upright 90 degrees                Oral Care Recommendations: Oral care BID Follow up Recommendations: Skilled Nursing facility SLP Visit Diagnosis: Dysphagia, oropharyngeal phase (R13.12) Plan: Continue with current plan of care       [place], [place], [place] Office: [phone]; Pager (7/16): [phone] 10/12/2018, 4:56 PM

## 2018-10-12 NOTE — Progress Notes (Signed)
Subjective: Reports doing well this morning and happy to see me. Alert and conversational.   Objective:  Vital signs in last 24 hours: Vitals:   10/11/18 0558 10/11/18 1543 10/11/18 2056 10/12/18 0546  BP: (!) 182/71 (!) 170/76 (!) 164/79 (!) 162/77  Pulse: 74 70 72 63  Resp: 18 18 16 15   Temp: 98.9 F (37.2 C) 98.5 F (36.9 C) 97.9 F (36.6 C) 98.7 F (37.1 C)  TempSrc: Axillary Oral Oral   SpO2:  100% 100% 99%  Weight: 95 kg     Height:       Physical Exam Constitutional:      General: He is not in acute distress.    Appearance: He is normal weight. He is not ill-appearing, toxic-appearing or diaphoretic.  HENT:     Head: Normocephalic and atraumatic.     Mouth/Throat:     Mouth: Mucous membranes are moist.     Pharynx: No oropharyngeal exudate.  Eyes:     Extraocular Movements: Extraocular movements intact.  Cardiovascular:     Rate and Rhythm: Normal rate and regular rhythm.     Pulses: Normal pulses.     Heart sounds: Normal heart sounds. No murmur. No friction rub. No gallop.   Pulmonary:     Effort: Pulmonary effort is normal. No respiratory distress.     Breath sounds: Normal breath sounds. No wheezing or rales.  Abdominal:     General: Abdomen is flat. Bowel sounds are normal. There is no distension.     Palpations: Abdomen is soft.     Tenderness: There is no abdominal tenderness. There is no guarding.  Musculoskeletal:        General: No swelling or deformity.  Skin:    General: Skin is warm.  Neurological:     Mental Status: He is alert and oriented to person, place, and time. Mental status is at baseline.     Comments: Baseline L sided weakness from previous stroke  Psychiatric:        Mood and Affect: Mood normal.    Assessment/Plan:  Principal Problem:   Seizure ([place]) Active Problems:   Seizure-like activity ([place])   Dysphagia  In summary, Ronald [name] is a 73 y.o male with a PMHx significant for prostate cancer, dementia, heart disease,  hyperlipidemia, HTN, measles, mumps, schizophrenia, multiple TIA, and stroke who presented today with tonic clonic seizures, suspected to be secondary to a R sided subacute stroke in mid June confirmed by head imaging. CT head showed no acute bleeds and MRI showed subacute stroke with no new findings. The patient's sister received clearance to be in the hospital. The sister says Ronald [name] appears to be at his baseline. Accepted for SNF placement pending negative COVID test.   Of note, with the assistance of his sister, Ronald [name] shared that he wen to [employer] for college. He went to school with the late astronaut Ronald [name], who was his best friend. He played saxophone and piano, and was a band Mudlogger and special needs Pharmacist, hospital.  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 The first siezure at the nursing home was resolved with 5mg  of Versed once EMS arrived. The patient seized again in the ED where 5mg  total of Ativan and 2000 mg of IV Keppra were administered. Subsequent EEG monitoring did not reveal seizure like activity. Of note, the patient is having involuntary LUE low  amplitude jerks. Neurology evaluated and thinks the pt is having EPC. LUE no longer jerking. Continue neuro recs: - Keppra 1500 mg BID - Vimpat 150 mg BID - Depakote 1g ER Q12H - Neurology signed off on 7/12   #FEN/GI #Hypokalemia #Hypomagnemesia  - Repleted - Aspirtation risk, SLP recs dysphagia 1(puree); honey-thick liquids via teaspoon.     #R parietal subacute stroke #Dysarthria: Can understand what the patient is saying more today than previous. The findings on CT head and MRI consistent with previous R MCA stroke w/ L sided deficits that are unchanged.  - Atorvastatin 81 mg daily - Plavix 74mg  daily - Lovenox 40mg  injection daily - Aspirin 325 mg daily    #HTN -  Metoprolol 50mg  PO BID - Lisinopril 20mg  PO daily - PRN hydralazine for BP greater than 287 systolic (received x2 in last 24 hrs)   Dispo: Anticipated discharge dependent on SNF placement. Per SW, Pt has been accepted to facility in [place], [place], pending negative COVID test.   Ronald Plater, MD Internal Medicine, PGY1 Pager: [phone]  10/12/2018,12:42 PM

## 2018-10-13 ENCOUNTER — Inpatient Hospital Stay (HOSPITAL_COMMUNITY): Payer: Medicare HMO

## 2018-10-13 LAB — CREATININE, SERUM
Creatinine, Ser: 0.77 mg/dL (ref 0.61–1.24)
GFR calc Af Amer: >60 mL/min (ref >60)
GFR calc non Af Amer: >60 mL/min (ref >60)

## 2018-10-13 LAB — GLUCOSE, CAPILLARY
Glucose-Capillary: 106 mg/dL — ABNORMAL HIGH (ref 70–99)
Glucose-Capillary: 116 mg/dL — ABNORMAL HIGH (ref 70–99)
Glucose-Capillary: 69 mg/dL — ABNORMAL LOW (ref 70–99)
Glucose-Capillary: 69 mg/dL — ABNORMAL LOW (ref 70–99)
Glucose-Capillary: 70 mg/dL (ref 70–99)
Glucose-Capillary: 70 mg/dL (ref 70–99)
Glucose-Capillary: 75 mg/dL (ref 70–99)
Glucose-Capillary: 75 mg/dL (ref 70–99)
Glucose-Capillary: 75 mg/dL (ref 70–99)
Glucose-Capillary: 92 mg/dL (ref 70–99)

## 2018-10-13 MED ORDER — LEVETIRACETAM 100 MG/ML PO SOLN: 1500.0000 mg | Freq: Two times a day (BID) | ORAL | 12 refills | Status: AC

## 2018-10-13 MED ORDER — GLUCOSE 40 % PO GEL
ORAL | Status: AC
  Administered 2018-10-13: 37.5 g
  Filled 2018-10-13: qty 1

## 2018-10-13 MED ORDER — DIVALPROEX SODIUM 125 MG PO CSDR: 1000.0000 mg | DELAYED_RELEASE_CAPSULE | Freq: Two times a day (BID) | ORAL | Status: AC

## 2018-10-13 MED ORDER — ADULT MULTIVITAMIN W/MINERALS CH: 1.0000 | ORAL_TABLET | Freq: Every day | ORAL | Status: AC

## 2018-10-13 MED ORDER — GLUCOSE 40 % PO GEL
ORAL | Status: AC
  Filled 2018-10-13: qty 1

## 2018-10-13 MED ORDER — AMLODIPINE BESYLATE 5 MG PO TABS: 5.0000 mg | ORAL_TABLET | Freq: Every day | ORAL | Status: AC

## 2018-10-13 MED ORDER — LISINOPRIL 20 MG PO TABS: 20.0000 mg | ORAL_TABLET | Freq: Every day | ORAL | Status: AC

## 2018-10-13 MED ORDER — LACOSAMIDE 150 MG PO TABS: 150.0000 mg | ORAL_TABLET | Freq: Two times a day (BID) | ORAL | Status: DC

## 2018-10-13 NOTE — TOC Progression Note (Signed)
Transition of Care [facility]) - Progression Note    Patient Details  Name: Yovanni Frenette MRN: [id number] Date of Birth: 02/26/1946  Transition of Care [facility]) CM/SW [place], LCSW Phone Number: 10/13/2018, 10:07 AM  Clinical Narrative:    CSW received a call from patient's sister. She again stated concerns about [facility] and said that she had placed a call into their administrator to discuss details. We discussed home health services again. She will follow up with CSW. Still awaiting pasrr number for SNF.    Expected Discharge Plan: [place] Barriers to Discharge: Continued Medical Work up  Expected Discharge Plan and Services Expected Discharge Plan: [place]: [place] arrangements for the past 2 months: Single Family Home, [place]                                       Social Determinants of Health (SDOH) Interventions    Readmission Risk Interventions Readmission Risk Prevention Plan 10/10/2018  Transportation Screening Complete  PCP or Specialist Appt within 3-5 Days Complete  HRI or [place] Complete  Social Work Consult for [place] Planning/Counseling Complete  Palliative Care Screening Not Applicable  Medication Review Press photographer) Complete  Some recent data might be hidden

## 2018-10-13 NOTE — Progress Notes (Signed)
Pt cbg up to 106

## 2018-10-13 NOTE — Progress Notes (Signed)
  Date: 10/13/2018  Patient name: Ronald [name]  Medical record number: [id number]  Date of birth: 11-Jan-1946   I have seen and evaluated this patient and I have discussed the plan of care with the house staff. Please see Dr. Redgie Grayer note for complete details. I concur with his findings and plan.  [name] is optimized from a medical standpoint.  Discharge to chosen SNF when bed available.     Sid Falcon, MD 10/13/2018, 6:56 PM

## 2018-10-13 NOTE — TOC Progression Note (Signed)
Transition of Care [facility]) - Progression Note    Patient Details  Name: Ronald [name] MRN: [id number] Date of Birth: 31-Oct-1945  Transition of Care [facility]) CM/SW [place], LCSW Phone Number: 10/13/2018, 9:13 AM  Clinical Narrative:    CSW received call from patient's sister, Florian Buff. She stated that she had done more research on [facility] and she did not realize that her other brother had been there and passed away. She requested a different facility. CSW explained that [facility] had already received authorization and [facility] had been the only other option. CSW explained that they could wait on bed availability at other facilities after patient went to [facility], but that would be the only option. She expressed understanding and stated that would call CSW back after she spoke with her brothers. She reported that she might just let them take the patient home with them if they declined [facility]. CSW explained that home health services would not provide 24 hour supervision, but that was up to them. She stated she would call CSW back with final decision.     Expected Discharge Plan: [place] Barriers to Discharge: Continued Medical Work up  Expected Discharge Plan and Services Expected Discharge Plan: [place]: [place] arrangements for the past 2 months: Single Family Home, [place]                                       Social Determinants of Health (SDOH) Interventions    Readmission Risk Interventions Readmission Risk Prevention Plan 10/10/2018  Transportation Screening Complete  PCP or Specialist Appt within 3-5 Days Complete  HRI or [place] Complete  Social Work Consult for [place] Planning/Counseling Complete  Palliative Care Screening Not Applicable  Medication Review Press photographer) Complete  Some recent data might be hidden

## 2018-10-13 NOTE — Progress Notes (Signed)
Modified Barium Swallow Progress Note  Patient Details  Name: Ronald [name] MRN: [id number] Date of Birth: 08-05-1945  Today's Date: 10/13/2018  Modified Barium Swallow completed.  Full report located under Chart Review in the Imaging Section.  Brief recommendations include the following:  Clinical Impression  Pt continues with mild worsening of oral dysphagia compared to earlier this month with increased containment issues with liquids and oral residuals with all consistencies.  Pt also with tongue base residuals which he does not always sense.  Premature spillage of all boluses into pharynx noted.  Pharyngeal phase with mildly motility resulting in mild tongue base residuals.   His motor function adequately protected trachea to prevent penetration or aspiration when sitting FULLY upright. The barium pill given with pudding lodged at the valleculae without pt awareness.  Further boluses of pudding effectively transited it into esophagus.  Upon esophageal sweep, barium tablet appeared in distal esophagus and did not clear with 2nd bolus of pudding, liquids however appeared to aid clearance.  Recommend pt diet upgrade to dys3/thin assuring he will FULLY upright,  meds whole in applesauce - start and follow with thin, follow solid bites with thin swallows and check oral cavity for pocketed food. Will continue to follow for safety and efficiency and family education.   Swallow Evaluation Recommendations       SLP Diet Recommendations: Dysphagia 3 (Mech soft) solids;Thin liquid       Medication Administration: Whole meds with puree   Supervision: Full supervision/cueing for compensatory strategies   Compensations: Slow rate;Small sips/bites;Follow solids with liquid Start all intake with liquids, check for oral residuals on left   Postural Changes: Remain semi-upright after after feeds/meals (Comment);Seated upright at 90 degrees   Oral Care Recommendations: Oral care BID       [name], MS [place] Pager [phone] Office [phone]   [name] 10/13/2018,9:59 AM

## 2018-10-13 NOTE — Discharge Instructions (Signed)
Epilepsy Epilepsy is when a person keeps having seizures. A seizure is a burst of abnormal activity in the brain. A seizure can change how you think or behave, and it can make it hard to be aware of what is happening. This condition can cause problems such as:  Falls, accidents, and injury.  Sadness (depression).  Poor memory.  Sudden unexplained death in epilepsy (SUDEP). This is rare. Its cause is not known. Most people with epilepsy lead normal lives. What are the causes? This condition may be caused by:  A head injury.  An injury that happens at birth.  A high fever during childhood.  A stroke.  Bleeding that goes into or around the brain.  Certain medicines and drugs.  Having too little oxygen for a long period of time.  Abnormal brain development.  Certain infections.  Brain tumors.  Conditions that are passed from parent to child (are hereditary). What are the signs or symptoms? Symptoms of a seizure vary from person to person. They may include:  Jerky movements of muscles (convulsions).  Stiffening of the body.  Movements of the arms or legs that you are not able to control.  Passing out (loss of consciousness).  Breathing problems.  Sudden falls.  Confusion.  Head nodding.  Eye blinking or twitching.  Lip smacking.  Drooling.  Fast eye movements.  Grunting.  Not being able to control when you pee or poop.  Staring.  Being hard to wake up (unresponsiveness). Some people have symptoms right before a seizure happens (aura) and right after a seizure happens. These symptoms include:  Fear or anxiety.  Feeling sick to your stomach (nauseous).  Feeling like the room is spinning (vertigo).  A feeling of having seen or heard something before (dj vu).  Odd tastes or smells.  Changes in how you see (vision), such as seeing flashing lights or spots. Symptoms that follow a seizure include:  Being confused.  Being sleepy.  Having a  headache. How is this treated? Treatment can control seizures. Treatment for this condition may involve:  Taking medicines to control seizures.  Having a device (vagus nerve stimulator) put in the chest. The device sends signals to a nerve and to the brain to prevent seizures.  Brain surgery to stop seizures from happening or to reduce how often they happen.  Having blood tests often to make sure you are getting the right amount of medicine. Once this condition has been diagnosed, it is important to start treatment as soon as possible. For some people, epilepsy goes away in time. Others will need treatment for the rest of their life. Follow these instructions at home: Medicines  Take over-the-counter and prescription medicines only as told by your doctor.  Avoid anything that may keep your medicine from working, such as alcohol. Activity  Get enough rest. Lack of sleep can make seizures more likely to occur.  Follow your doctor's advice about driving, swimming, and doing anything else that would be dangerous if you had a seizure. ? If you live in the [place] check with your local DMV (department of motor vehicles) to find out about local driving laws. Each state has rules about when you can return to driving. Teaching others Teach friends and family what to do if you have a seizure. They should:  Lay you on the ground to prevent a fall.  Cushion your head and body.  Loosen any tight clothing around your neck.  Turn you on your side.  Stay with  you until you are better.  Not hold you down.  Not put anything in your mouth.  Know whether or not you need emergency care.  General instructions  Avoid anything that causes you to have seizures.  Keep a seizure diary. Write down what you remember about each seizure. Be sure to include what might have caused it.  Keep all follow-up visits as told by your doctor. This is important. Contact a doctor if:  You have a change in how  often or when you have seizures.  You get an infection or start to feel sick. You may have more seizures when you are sick. Get help right away if:  A seizure does not stop after 5 minutes.  You have more than one seizure in a row, and you do not have enough time between the seizures to feel better.  A seizure makes it harder to breathe.  A seizure is different from other seizures you have had.  A seizure makes you unable to speak or use a part of your body.  You did not wake up right after a seizure. These symptoms may be an emergency. Do not wait to see if the symptoms will go away. Get medical help right away. Call your local emergency services (911 in the [place] Do not drive yourself to the hospital. Summary  Epilepsy is when a person keeps having seizures. A seizure is a burst of abnormal activity in the brain.  Treatment can control seizures.  Teach friends and family what to do if you have a seizure. This information is not intended to replace advice given to you by your health care provider. Make sure you discuss any questions you have with your health care provider. Document Released: 01/10/2009 Document Revised: 11/07/2017 Document Reviewed: 11/07/2017 Elsevier Patient Education  2020 [employer].   Dysphagia Eating Plan, Minced and Moist Foods This eating plan is for people with moderate swallowing problems who are transitioning from pureed to solid foods. Moist and minced foods are soft and cut into very small chunks so that they can be swallowed safely. On this eating plan, you may be instructed to drink liquids that are thickened. Work with your health care provider and your diet and nutrition specialist (dietitian) to make sure that you are following the diet safely and getting all the nutrients you need. What are tips for following this plan? General guidelines for foods   You may eat foods that are soft and moist.  Always test food texture before taking a  bite. Poke food with a fork or spoon to make sure it is tender.  Take small bites. Each bite should be smaller than your little finger nail (about 4 mm by 4 mm).  If you were on a pureed food eating plan, you may still eat any of the foods included in that diet.  Avoid foods that are dry, hard, sticky, chewy, coarse, or crunchy.  Avoid foods that separate into thin liquids and solids, such as cereal with milk or chunky soups.  Avoid liquids that have seeds or chunks.  If instructed by your health care provider, thicken liquids. Follow your health care provider's instructions about what products to use, how to do this, and to what thickness. ? You may use a commercial thickener, rice cereal, or potato flakes. ? Thickened liquids are usually a pudding-like consistency, or they may be as thick as honey or thick enough to eat with a spoon. Cooking  You may need to use  a blender, whisk, or masher to soften some of your foods.  To moisten foods, you may add liquids while you are blending, mashing, or grinding your foods to the right consistency. These liquids include gravies, sauces, vegetable or fruit juice, milk, half and half, or water.  Reheat foods slowly to prevent a tough crust from forming. Meal planning  Eat a variety of foods in order to get all the nutrients you need.  Follow your meal plan as told by your health care provider or dietitian. What foods are allowed? Grains Soaked soft breads without nuts or seeds. Pancakes, sweet rolls, pastries, and [place] toast that have been moistened with syrup or sauce. Well-cooked pasta, noodles, rice, and bread dressing in very small pieces and thick sauce. Soft dumplings or spaetzle in very small pieces and butter or gravy. Soft-cooked cereals. Vegetables Very soft, well-cooked vegetables in very small pieces. Soft-cooked, mashed potatoes. Thickened vegetable juice. Fruits Canned or cooked fruits that are soft or moist and do not have  skin or seeds. Fresh, soft bananas. Thickened fruit juices. Meat and other protein foods Tender, moist, and finely minced or ground meats or poultry. Moist meatballs or meatloaf. Fish without bones. Scrambled, poached, or soft-cooked eggs. Tofu. Tempeh and meat alternatives in very small pieces. Well-cooked, moistened and mashed beans, baked beans, peas, and other legumes. Dairy Thickened milk. Cream cheese. Yogurt. Cottage cheese. Sour cream. Fats and oils Butter. Margarine. Cream for cereal, depending on liquid consistency allowed. Gravy. Cream sauces. Mayonnaise. Sweets and desserts Pudding. Custard. Ice cream and sherbet. Whipped toppings. Soft, moist cakes. Icing. Jelly. Jams and preserves without seeds. Seasoning and other foods Sauces and salsas that have soft chunks that are smaller than 31mm. Salad dressings. Casseroles with small pieces of tender meat. All seasonings and sweeteners. Beverages Anything prepared at the thickness recommended by your dietitian. What foods are not allowed? Grains Breads that are hard or have nuts or seeds. Dry biscuits, pancakes, waffles, and bread dressing. Coarse cereals. Cereals that have nuts, seeds, dried fruits, or coconut. Sticky rice. Large pieces of pasta. Vegetables All raw vegetables. Tough, fibrous, chewy, or stringy cooked vegetables, such as celery, peas, broccoli, cabbage, Brussels sprouts, and asparagus. Potato skins. Potato and other vegetable chips. Fried or French-fried potatoes. Cooked corn and peas. Fruits Hard, crunchy, stringy, high-pulp, and juicy raw fruits such as apples, pineapple, papaya, and watermelon. Fruits with skins and seeds, such as grapes. Dried fruit and fruit leather. Meats and other protein foods Large pieces of meat. Dry, tough meats, such as bacon, sausage, and hot dogs. Chicken, [place], or fish with skin and bones. Crunchy peanut butter. Nuts. Seeds. Dairy Yogurt with nuts, seeds, or large chunks. Large chunks of  cheese. Frozen desserts and milk consistency not allowed by your dietitian. Sweets and desserts Coarse, hard, chewy, or sticky desserts. Any dessert with nuts, seeds, coconut, pineapple, or dried fruit. Bread pudding. Seasoning and other foods Soups and casseroles with large chunks. Sandwiches. Pizza. Summary  Moist and minced foods can be helpful for people with moderate swallowing problems.  On the dysphagia eating plan, you may eat foods that are soft, moist, and cut into pieces smaller than 69mm by 73mm.  You may be instructed to thicken liquids. Follow your health care provider's instructions about how to do this and to what consistency. This information is not intended to replace advice given to you by your health care provider. Make sure you discuss any questions you have with your health care provider. Document  Released: 03/15/2005 Document Revised: 07/06/2018 Document Reviewed: 06/25/2016 Elsevier Patient Education  2020 [employer].

## 2018-10-13 NOTE — Consult Note (Signed)
Pharmacy student rounding with the IMTS/B2/Lane service was requested to consult on the appropriate anti-platelet therapy in the setting of secondary stroke prevention. Neurology notes indicate that the patient should be continued on dual-anti-platelet therapy (DAPT) for two more months (3 months total, starting 09/14/2018). The recommended DAPT regimen is to include 325 mg aspirin and 75 mg clopidogrel by mouth daily.   I have evaluated his baseline laboratory values as documented in his history and physical exam in addition to the consultations and therapeutic drug monitoring per the Neurology service.   After 18-Sept-2020 the patient can be transitioned to single anti-platelet therapy of clopidogrel (Plavix) 75 mg daily for continued secondary stroke prevention. This recommendation aligns with both the Neurology discharge summary as well as the AHA/ASA 2018 Guidelines for Early Management of Patients with Acute Ischemic Stroke.   [place]

## 2018-10-13 NOTE — Progress Notes (Signed)
Subjective: Mr. Yepes feels well this AM. No complaints.   Objective:  Vital signs in last 24 hours: Vitals:   10/12/18 1957 10/12/18 2147 10/13/18 0414 10/13/18 0834  BP: (!) 144/60 (!) 160/75 (!) 177/75 (!) 189/76  Pulse: 72 69 65   Resp: 20  20   Temp: 98.4 F (36.9 C)  98.4 F (36.9 C)   TempSrc: Oral  Oral   SpO2: 100%  100%   Weight:   94 kg   Height:       Physical Exam Constitutional:      General: He is not in acute distress.    Appearance: Normal appearance. He is not toxic-appearing.  HENT:     Head: Normocephalic and atraumatic.     Mouth/Throat:     Mouth: Mucous membranes are moist.  Eyes:     General: No scleral icterus.       Right eye: No discharge.        Left eye: No discharge.     Extraocular Movements: Extraocular movements intact.  Cardiovascular:     Rate and Rhythm: Normal rate and regular rhythm.     Pulses: Normal pulses.     Heart sounds: Normal heart sounds. No murmur. No friction rub. No gallop.   Pulmonary:     Effort: Pulmonary effort is normal. No respiratory distress.     Breath sounds: Normal breath sounds. No rales.  Abdominal:     General: Abdomen is flat. Bowel sounds are normal. There is no distension.     Palpations: Abdomen is soft.     Tenderness: There is no abdominal tenderness. There is no guarding.  Musculoskeletal:        General: No swelling or deformity.     Comments: Spontaneously moves all extremities. L sided weakness compared to R  Skin:    General: Skin is warm.  Neurological:     Mental Status: He is alert and oriented to person, place, and time. Mental status is at baseline.  Psychiatric:        Mood and Affect: Mood normal.    Assessment/Plan:  Principal Problem:   Seizure ([place]) Active Problems:   Seizure-like activity (HCC)   Dysphagia  In summary, Mr. Dault is a 73 y.o male with a PMHx significant for prostate cancer, dementia, heart disease, hyperlipidemia, HTN, measles, mumps,  schizophrenia, multiple TIA, and stroke who presented today with tonic clonic seizures, suspected to besecondary to a R sided subacute strokein mid Juneconfirmed byhead imaging.CT headshowed no acute bleedsand MRIshowed subacute stroke with no new findings. The patient's sister received clearance to be in the hospital.The sister says Mr. Ringer appears to be at his baseline.Accepted for SNF placement pending negative COVID test.  Of note, with the assistance of his sister, Mr. Cheek shared that he wen to [employer] for college. He went to school with the late astronaut Celene Skeen, who was his best friend. He played saxophone and piano, and was a band Insurance account manager.  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 The first siezure at the nursing home was resolved with 5mg  of Versed once EMS arrived. The patient seized again in the ED where 5mg totalof Ativan and 2000 mgofIV Keppra were administered. Subsequent EEG monitoring did not reveal seizure like activity.Of note, the patient is having involuntary LUE low amplitude jerks.Neurology evaluated andthinks the pt is having EPC.LUE no longer  jerking. Continue neuro recs: - Keppra1500 mgBID -Vimpat150 mgBID - Depakote1gERQ12H -Neurology signedoff on 7/12  #FEN/GI #Hypokalemia #Hypomagnemesia - Repleted -Aspirtation risk,SLP recsdysphagia 1(puree); honey-thick liquids via teaspoon.   #R parietal subacute stroke #Dysarthria:Can understand what the patient is sayingmore today than previous. The findings on CT headand MRIconsistent with previousRMCA stroke w/ L sided deficits that are unchanged.  - Atorvastatin 81 mg daily -Plavix 74mg  daily - Lovenox 40mg  injection daily - Aspirin 325 mg daily  #HTN -Metoprolol50mg  PO BID - Lisinopril  20mg  PO daily -PRN hydralazine for BP greater than 035 systolic(received x2 in last 24 hrs)  Dispo: Anticipated dischargedependent on SNF placement. Per SW,Pt has been accepted to facility in [facility], [place], COVID negative. However sister has concerns about SNF placement, and will call administrator to discuss details. SW f/u. D/c orders have been placed.  [name], MD Internal Medicine, PGY1 Pager: [phone]  10/13/2018,11:50 AM

## 2018-10-13 NOTE — Progress Notes (Addendum)
Pt with a low cbg of 69. Pt cbg rechecked showing a cbg of 69. Pt not eating his breakfast. Pt given glutose gel. Will have tech recheck cbg in 15 minutes. Will continue to monitor pt and encourage to eat/drink. Provider at bedside made aware of pt's low cbg and is ok with pt going to fluro with a cbg of 75.

## 2018-10-14 LAB — GLUCOSE, CAPILLARY
Glucose-Capillary: 80 mg/dL (ref 70–99)
Glucose-Capillary: 69 mg/dL — ABNORMAL LOW (ref 70–99)
Glucose-Capillary: 64 mg/dL — ABNORMAL LOW (ref 70–99)
Glucose-Capillary: 71 mg/dL (ref 70–99)
Glucose-Capillary: 61 mg/dL — ABNORMAL LOW (ref 70–99)
Glucose-Capillary: 106 mg/dL — ABNORMAL HIGH (ref 70–99)

## 2018-10-14 NOTE — Progress Notes (Signed)
Pt's sister Irean Hong contacted CSW concerning pt's placement.  Pt's sister suggested playing Christan music when pt becomes agitated even if he does not want to hear the music.  CSW will continue to follow for disposition at  [facility] when Rosalie Gums is approved.  Reed Breech LCSWA [phone]

## 2018-10-14 NOTE — Progress Notes (Signed)
  Date: 10/14/2018  Patient name: Ronald [name]  Medical record number: [id number]  Date of birth: 03-28-1946   I have seen and evaluated this patient and I have discussed  The patient case with the house staff. Please see their note for complete details. Ronald [name] is discharged awaiting placement in [place].  He is medically optimized.  Discharge order placed 10/13/18.   Sid Falcon, MD 10/14/2018, 7:58 PM

## 2018-10-14 NOTE — TOC Progression Note (Signed)
Transition of Care [facility]) - Progression Note    Patient Details  Name: Axcel Horsch MRN: [id number] Date of Birth: 25-May-1945  Transition of Care [facility]) CM/SW [place], LCSW Phone Number: 10/14/2018, 8:30 AM  Clinical Narrative:    CSW still awaiting pasrr number for SNF. Patient's sister requested a call from the facility admissions department. Levada Dy stated she would contact [place] to answer any additional questions.   Expected Discharge Plan: Skilled Nursing Facility Barriers to Discharge: Continued Medical Work up  Expected Discharge Plan and Services Expected Discharge Plan: [place] Choice: [place] arrangements for the past 2 months: [place], [place] Expected Discharge Date: 10/13/18                                     Social Determinants of Health (SDOH) Interventions    Readmission Risk Interventions Readmission Risk Prevention Plan 10/10/2018  Transportation Screening Complete  PCP or Specialist Appt within 3-5 Days Complete  HRI or [place] Complete  Social Work Consult for [place] Planning/Counseling Complete  Palliative Care Screening Not Applicable  Medication Review Press photographer) Complete  Some recent data might be hidden

## 2018-10-14 NOTE — Progress Notes (Signed)
Subjective: Ronald [name] continues at baseline this AM. No complaints.   Objective:  Vital signs in last 24 hours: Vitals:   10/13/18 0834 10/13/18 1206 10/14/18 0455 10/14/18 1259  BP: (!) 189/76 (!) 178/83 (!) 199/84 (!) 193/79  Pulse:  63 64 70  Resp:  18  19  Temp:  97.8 F (36.6 C) 98.5 F (36.9 C) 98 F (36.7 C)  TempSrc:  Oral Oral Oral  SpO2:  100% 98% 99%  Weight:      Height:       Physical Exam Constitutional:      General: He is not in acute distress.    Appearance: Normal appearance. He is not toxic-appearing.  HENT:     Head: Normocephalic and atraumatic.     Mouth/Throat:     Mouth: Mucous membranes are moist.  Eyes:     General: No scleral icterus.       Right eye: No discharge.        Left eye: No discharge.     Extraocular Movements: Extraocular movements intact.  Cardiovascular:     Rate and Rhythm: Normal rate and regular rhythm.     Pulses: Normal pulses.     Heart sounds: Normal heart sounds. No murmur. No friction rub. No gallop.   Pulmonary:     Effort: Pulmonary effort is normal. No respiratory distress.     Breath sounds: Normal breath sounds. No rales.  Abdominal:     General: Abdomen is flat. Bowel sounds are normal. There is no distension.     Palpations: Abdomen is soft.     Tenderness: There is no abdominal tenderness. There is no guarding.  Musculoskeletal:        General: No swelling or deformity.     Comments: Spontaneously moves all extremities. L sided weakness compared to R  Skin:    General: Skin is warm.  Neurological:     Mental Status: He is alert and oriented to person, place, and time. Mental status is at baseline.  Psychiatric:        Mood and Affect: Mood normal.    Assessment/Plan:  Principal Problem:   Seizure ([place]) Active Problems:   Seizure-like activity (HCC)   Dysphagia  In summary, Ronald [name] is a 73 y.o male with a PMHx significant for prostate cancer, dementia, heart disease, hyperlipidemia, HTN,  measles, mumps, schizophrenia, multiple TIA, and stroke who presented today with tonic clonic seizures, suspected to besecondary to a R sided subacute strokein mid Juneconfirmed byhead imaging.CT headshowed no acute bleedsand MRIshowed subacute stroke with no new findings. The patient's sister received clearance to be in the hospital.The sister says Ronald [name] appears to be at his baseline.Accepted for SNF placement pending negative COVID test.  Of note, with the assistance of his sister, Ronald [name] shared that he wen to [employer] for college. He went to school with the late astronaut Celene Skeen, who was his best friend. He played saxophone and piano, and was a band Insurance account manager.  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 The descriptions of full body convulsions that were improved by Versed is consistent with generalized tonic-clonic seizure. The first siezure at the nursing home was resolved with 5mg  of Versed once EMS arrived. The patient seized again in the ED where 5mg totalof Ativan and 2000 mgofIV Keppra were administered. Subsequent EEG monitoring did not reveal seizure like activity.Of note, the patient is having involuntary LUE low amplitude jerks.Neurology evaluated andthinks the pt is  having EPC.LUE no longer jerking. Continue neuro recs: - Keppra1500 mgBID -Vimpat150 mgBID - Depakote1gERQ12H -Neurology signedoff on 7/12  #FEN/GI #Hypokalemia #Hypomagnemesia - Repleted -Aspirtation risk,SLP recsdysphagia 1(puree); honey-thick liquids via teaspoon.   #R parietal subacute stroke #Dysarthria:Can understand what the patient is sayingmore today than previous. The findings on CT headand MRIconsistent with previousRMCA stroke w/ L sided deficits that are unchanged.  - Atorvastatin 81 mg daily -Plavix 74mg  daily - Lovenox 40mg  injection daily - Aspirin 325 mg  daily  #HTN -Metoprolol50mg  PO BID - Lisinopril 20mg  PO daily -PRN hydralazine for BP greater than 573 systolic(received x2 in last 24 hrs)  Dispo: Anticipated dischargedependent on SNF placement. Per SW,Pt has been accepted to facility in [facility], [place], COVID negative. However sister has concerns about SNF placement, and will call administrator to discuss details. SW f/u. D/c orders have been placed.  Pearson Grippe, DO IM PGY-3

## 2018-10-15 LAB — GLUCOSE, CAPILLARY
Glucose-Capillary: 68 mg/dL — ABNORMAL LOW (ref 70–99)
Glucose-Capillary: 71 mg/dL (ref 70–99)
Glucose-Capillary: 77 mg/dL (ref 70–99)
Glucose-Capillary: 80 mg/dL (ref 70–99)
Glucose-Capillary: 83 mg/dL (ref 70–99)
Glucose-Capillary: 88 mg/dL (ref 70–99)
Glucose-Capillary: 89 mg/dL (ref 70–99)

## 2018-10-15 NOTE — Progress Notes (Signed)
Subjective: Patient seen at the bedside on rounds this morning.  Resting comfortably in bed.  No complaints today.  Objective:  Vital signs in last 24 hours: Vitals:   10/13/18 1206 10/14/18 0455 10/14/18 1259 10/14/18 2026  BP: (!) 178/83 (!) 199/84 (!) 193/79 (!) 165/70  Pulse: 63 64 70 91  Resp: 18  19 16   Temp: 97.8 F (36.6 C) 98.5 F (36.9 C) 98 F (36.7 C) 98.2 F (36.8 C)  TempSrc: Oral Oral Oral Oral  SpO2: 100% 98% 99% 100%  Weight:      Height:       Physical Exam Constitutional:      General: He is not in acute distress.    Appearance: Normal appearance. He is not ill-appearing, toxic-appearing or diaphoretic.  HENT:     Head: Normocephalic.  Eyes:     Extraocular Movements: Extraocular movements intact.  Cardiovascular:     Rate and Rhythm: Normal rate and regular rhythm.     Pulses: Normal pulses.     Heart sounds: Normal heart sounds. No murmur. No friction rub. No gallop.   Pulmonary:     Effort: Pulmonary effort is normal. No respiratory distress.     Breath sounds: Normal breath sounds. No wheezing or rales.  Abdominal:     General: Abdomen is flat. Bowel sounds are normal. There is no distension.     Palpations: Abdomen is soft.     Tenderness: There is no abdominal tenderness. There is no guarding.  Musculoskeletal:        General: No swelling or deformity.  Skin:    General: Skin is warm.  Neurological:     Mental Status: He is alert and oriented to person, place, and time. Mental status is at baseline.  Psychiatric:        Mood and Affect: Mood normal.    Assessment/Plan:  Principal Problem:   Seizure (Ronald [name]) Active Problems:   Seizure-like activity (HCC)   Dysphagia  In summary, Ronald [name] is a 73 y.o male with a PMHx significant for prostate cancer, dementia, heart disease, hyperlipidemia, HTN, measles, mumps, schizophrenia, multiple TIA, and stroke who presented today with tonic clonic seizures, suspected to besecondary to a R  sided subacute strokein mid Juneconfirmed byhead imaging.CT headshowed no acute bleedsand MRIshowed subacute stroke with no new findings. The patient's sister received clearance to be in the hospital.The sister says Ronald [name] appears to be at his baseline.COVID negative. Accepted to SNF.  Of note, with the assistance of his sister, Ronald [name] shared that he wen to [employer] for college. He went to school with the late astronaut Ronald [name], who was his best friend. He played saxophone and piano, and was a band Insurance account manager.  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 The descriptions of full body convulsions that were improved by Versed is consistent with generalized tonic-clonic seizure. The first siezure at the nursing home was resolved with 5mg  of Versed once EMS arrived. The patient seized again in the ED where 5mg totalof Ativan and 2000 mgofIV Keppra were administered. Subsequent EEG monitoring did not reveal seizure like activity.Of note, the patient is having involuntary LUE low amplitude jerks.Neurology evaluated andthinks the pt is having EPC.LUE no longer jerking.Continue neuro recs: - Keppra1500 mgBID -Vimpat150 mgBID - Depakote1gERQ12H -Neurology signedoff on 7/12  #FEN/GI #Hypokalemia #Hypomagnemesia - Repleted -Aspirtation risk,SLP recsdysphagia 1(puree); honey-thick liquids via teaspoon.   #R parietal subacute stroke #Dysarthria:Can understand what the patient is sayingmore today than  previous days. The findings on CT headand MRIconsistent with previousRMCA stroke w/ L sided deficits that are unchanged.  - Atorvastatin 81 mg daily -Plavix 74mg  daily - Lovenox 40mg  injection daily - Aspirin 325 mg daily  #HTN -Metoprolol50mg  PO BID - Lisinopril 20mg  PO daily -PRN hydralazine for BP greater than 592 systolic  Dispo: Anticipated dischargedependent on SNF placement. Per  SW,Pt has been accepted to facility in [facility], [place], COVID negative. However sister has concerns about SNF placement, and will call administrator to discuss details. SW f/u. D/c orders have been placed.  [name], MD Internal Medicine, PGY1 Pager: [phone]  10/15/2018,11:33 AM

## 2018-10-15 NOTE — Progress Notes (Signed)
PASRR number still pending, current barrier to discharge to SNF.   [place], [place]

## 2018-10-15 NOTE — Progress Notes (Signed)
Updated pt's sister Rosana Fret) on how the pt was doing since she visited him today. She reported pt not eating well and had questions about a test that was done on Friday. Pt CBG was 68 pt was giving orange juice. Will continue to monitor.

## 2018-10-15 NOTE — Progress Notes (Signed)
  Date: 10/15/2018  Patient name: Ronald [name]  Medical record number: [id number]  Date of birth: [birth year]   This patient's plan of care was discussed with the house staff. Please see their note for complete details. I concur with their findings.  Patient discharged on 10/13/18 but still awaiting SNF bed placement.     Sid Falcon, MD 10/15/2018, 12:18 PM

## 2018-10-16 LAB — GLUCOSE, CAPILLARY
Glucose-Capillary: 124 mg/dL — ABNORMAL HIGH (ref 70–99)
Glucose-Capillary: 70 mg/dL (ref 70–99)
Glucose-Capillary: 84 mg/dL (ref 70–99)
Glucose-Capillary: 85 mg/dL (ref 70–99)
Glucose-Capillary: 85 mg/dL (ref 70–99)
Glucose-Capillary: 89 mg/dL (ref 70–99)

## 2018-10-16 NOTE — Progress Notes (Signed)
Occupational Therapy Treatment Patient Details Name: Ronald [name] MRN: [id number] DOB: Sep 27, 1945 Today's Date: 10/16/2018    History of present illness Patient is a 73 y/o male presenting to the ED on 09/29/2018 with priamry complaints of Tonic clonic seizure secondary to stroke. CT showed R parietal lobe hypodensity consistent with a subacute stroke. Of note, recent hospital discharge on 09/14/2018 for L MCA stroke for which he received tPA and angioplasty of the R terminal ICA. Past medical history of prostate cancer, dementia, heart disease, hyperlipidemia, HTN, measles, mumps, schizophrenia, multiple TIA, stroke.   OT comments  Pt very verbose and required max cuing for redirection throughout session. Pt becoming agitated throughout session as well if he felt you were not listening to him or doing what he asked therapist to do. Pt allowed OT to perform PROM to L  UE in all planes of movement x 5 reps. Pt wishing to eat breakfast but becoming very upset when therapist refused to feed him. OT holding container and pt utilized R UE to scoop and bring food to mouth. Pt began coughing and food stopped for safety. Pt remained in bed at end of session with call bell and all needed items within reach. Pt would continue to benefit from OT intervention.   Follow Up Recommendations  SNF;Supervision/Assistance - 24 hour    Equipment Recommendations  Other (comment)(defer to next venue of care)    Recommendations for Other Services PT consult;Speech consult    Precautions / Restrictions Precautions Precautions: Fall Restrictions Weight Bearing Restrictions: No       Mobility Bed Mobility    General bed mobility comments: remained at bed level and refused sitting EOB or OOB activities         ADL either performed or assessed with clinical judgement   ADL   Eating/Feeding: Set up;Sitting;Supervision/ safety      General ADL Comments: OT held food container and pt scooping food with  utensil with R UE with encouragement. Pt needing cuing to decreased talking for safety.     Cognition Arousal/Alertness: Awake/alert Behavior During Therapy: Agitated Overall Cognitive Status: No family/caregiver present to determine baseline cognitive functioning      Orientation Level: Situation;Place;Disoriented to;Time   Memory: Decreased short-term memory Following Commands: Follows one step commands inconsistently;Follows one step commands with increased time Safety/Judgement: Decreased awareness of safety;Decreased awareness of deficits Awareness: Intellectual Problem Solving: Slow processing;Requires verbal cues;Requires tactile cues;Decreased initiation;Difficulty sequencing General Comments: Pt very verbose throughout session with tangential speech and needing max cuing for redirection                   Pertinent Vitals/ Pain       Pain Assessment: Faces Faces Pain Scale: No hurt         Frequency  Min 2X/week        Progress Toward Goals  OT Goals(current goals can now be found in the care plan section)  Progress towards OT goals: Progressing toward goals  Acute Rehab OT Goals Patient Stated Goal: none stated OT Goal Formulation: Patient unable to participate in goal setting  Plan Discharge plan remains appropriate;Frequency remains appropriate       AM-PAC OT "6 Clicks" Daily Activity     Outcome Measure   Help from another person eating meals?: A Little Help from another person taking care of personal grooming?: A Little Help from another person toileting, which includes using toliet, bedpan, or urinal?: Total Help from another person bathing (including washing, rinsing, drying)?:  A Lot Help from another person to put on and taking off regular upper body clothing?: A Lot Help from another person to put on and taking off regular lower body clothing?: Total 6 Click Score: 12    End of Session    OT Visit Diagnosis: Unsteadiness on feet  (R26.81);Other abnormalities of gait and mobility (R26.89);Muscle weakness (generalized) (M62.81);Other symptoms and signs involving cognitive function;Hemiplegia and hemiparesis;Feeding difficulties (R63.3) Hemiplegia - Right/Left: Left Hemiplegia - dominant/non-dominant: Non-Dominant Hemiplegia - caused by: Cerebral infarction   Activity Tolerance Treatment limited secondary to agitation   Patient Left with call bell/phone within reach;in bed;with bed alarm set          Time: 6160-7371 OT Time Calculation (min): 18 min  Charges: OT General Charges $OT Visit: 1 Visit OT Treatments $Neuromuscular Re-education: 8-22 mins   Gypsy Decant 10/16/2018, 10:36 AM

## 2018-10-16 NOTE — Progress Notes (Signed)
  Date: 10/16/2018  Patient name: Ronald [name]  Medical record number: [id number]  Date of birth: 09-02-1945   This patient's plan of care was discussed with the house staff. Please see their note for complete details. I concur with their findings.  Patient to discharge today.    Sid Falcon, MD 10/16/2018, 6:10 PM

## 2018-10-16 NOTE — Progress Notes (Signed)
Patient IV sites remove.patient  is ready for PTAR to transport to facility. report given to [facility] LPN at  [facility].

## 2018-10-16 NOTE — TOC Transition Note (Signed)
Transition of Care [facility]) - CM/SW Discharge Note   Patient Details  Name: Calder Oblinger MRN: [id number] Date of Birth: [birth year]  [facility] of [facility]) CM/SW Contact:  Benard Halsted, LCSW Phone Number: 10/16/2018, 3:45 PM   Clinical Narrative:    Patient will DC to: [facility] Anticipated DC date: 10/16/18 Family notified: Beulah, sister Transport by: PTAR  Patient's sister reports that she does not feel patient can return home with her brothers at this time and is in agreement with discharge to [facility] today. She will take his belongings there.    Per MD patient ready for DC to [facility]. RN, patient, patient's family, and facility notified of DC. Discharge Summary and FL2 sent to facility. RN to call report prior to discharge [phone] Room 138). DC packet on chart. Ambulance transport requested for patient.   CSW will sign off for now as social work intervention is no longer needed. Please consult [place] again if new needs arise.  Cedric Fishman, LCSW Clinical Social Worker [phone]      Barriers to Discharge: No Barriers Identified   Patient Goals and CMS Choice   CMS Medicare.gov Compare Post Acute Care list provided to:: Patient Represenative (must comment)(Beulah (sister)) Choice offered to / list presented to : (sister Beulah)  Discharge Placement PASRR number recieved: 10/16/18            Patient chooses bed at: [facility] Patient to be transferred to facility by: [place] Name of family member notified: Florian Buff, sister Patient and family notified of of transfer: 10/16/18  Discharge Plan and Services     Post Acute Care Choice: [place]                               Social Determinants of Health (SDOH) Interventions     Readmission Risk Interventions Readmission Risk Prevention Plan 10/10/2018  Transportation Screening Complete  PCP or Specialist Appt within 3-5 Days Complete  HRI or [place] Complete  Social Work Consult for [place] Planning/Counseling Complete  Palliative Care Screening Not Applicable  Medication Review Press photographer) Complete  Some recent data might be hidden

## 2018-10-16 NOTE — Progress Notes (Signed)
Patient was seen on rounds this morning.  He has no new complaints today. The patient has been medically optimized and is stable for discharge.The discharge orders have been placed. Currently waiting SNF placement.   [name], MD Internal Medicine, PGY1 Pager: [phone]  10/16/2018,12:35 PM

## 2018-10-16 NOTE — Progress Notes (Signed)
Patient discharged to [facility] per orders. Discharge instructions in D/C packet with patient. Patient verbalizes understanding. IV discontinued per orders by previous shift RN. Belongings packed up by staff. Denies needs at this time. Patient off unit via stretcher by PTAR. This RN called [facility] to notify RN that HS meds were given.

## 2018-10-16 NOTE — TOC Progression Note (Signed)
Transition of Care [facility]) - Progression Note    Patient Details  Name: Ronald [name] MRN: [id number] Date of Birth: Aug 14, 1945  Transition of Care [facility]) CM/SW [place], LCSW Phone Number: 10/16/2018, 9:30 AM  Clinical Narrative:    CSW received notification of pasrr:   [id number] H Start date 10/16/18, no expiration date.   CSW provided info to [facility]. They need to check the status of the insurance approval and will contact CSW back. MD aware.    Expected Discharge Plan: Skilled Nursing Facility Barriers to Discharge: Continued Medical Work up  Expected Discharge Plan and Services Expected Discharge Plan: [place] Choice: [place] arrangements for the past 2 months: [place], [place] Expected Discharge Date: 10/13/18                                     Social Determinants of Health (SDOH) Interventions    Readmission Risk Interventions Readmission Risk Prevention Plan 10/10/2018  Transportation Screening Complete  PCP or Specialist Appt within 3-5 Days Complete  HRI or [place] Complete  Social Work Consult for [place] Planning/Counseling Complete  Palliative Care Screening Not Applicable  Medication Review Press photographer) Complete  Some recent data might be hidden

## 2018-11-02 ENCOUNTER — Telehealth: Payer: Self-pay

## 2018-11-02 ENCOUNTER — Inpatient Hospital Stay: Payer: Medicare HMO | Admitting: Adult Health

## 2018-11-02 NOTE — Progress Notes (Deleted)
[facility] [address] [place]. [place] [address] [phone]       HOSPITAL FOLLOW UP NOTE  [name] Date of Birth:  [birth year] Medical Record Number:  [id number]   Reason for Referral:  hospital stroke follow up    CHIEF COMPLAINT:  No chief complaint on file.   HPI: Ronald [name] being seen today for in office hospital follow-up regarding ***.  History obtained from *** and chart review. Reviewed all radiology images and labs personally.  Ronald [name] a 73 y.o.malewith history of stroke, schizophrenia, measles, hypertension, hyperlipidemia, depression, dementia. Patient was found at his home sitting in a chair when he suddenly slumped over to the left and was noted to be flaccid on the left with right gaze deviation along with dysarthric. EMS was called and patient was immediately brought as a code stroke to [facility]. Patient is unable to give history at this time. Patient with recent admission 04/2018 with TIA d/t R ICA occlusion in setting of hypotension, told to avoid low BP w/ goal 130-160.    ROS:   14 system review of systems performed and negative with exception of ***  PMH:  Past Medical History:  Diagnosis Date  . Cancer [facility])    Prostate  . Chicken pox   . Dementia ([place])   . Depression   . Heart disease   . Hyperlipemia   . Hypertension   . Measles   . Mumps   . 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 Stroke [facility])     PSH:  Past Surgical History:  Procedure Laterality Date  . IR ANGIO EXTERNAL CAROTID SEL EXT CAROTID UNI R MOD SED  07/20/2016  . IR ANGIO INTRA EXTRACRAN SEL COM CAROTID INNOMINATE UNI L MOD SED  08/31/2018  . IR ANGIO INTRA EXTRACRAN SEL INTERNAL CAROTID BILAT MOD SED  07/20/2016  . IR ANGIO VERTEBRAL SEL SUBCLAVIAN INNOMINATE UNI L MOD SED  07/20/2016  . IR ANGIOGRAM EXTREMITY RIGHT  07/20/2016  . IR CT HEAD LTD  08/31/2018  . IR PERCUTANEOUS ART THROMBECTOMY/INFUSION  INTRACRANIAL INC DIAG ANGIO  08/31/2018  . RADIOLOGY WITH ANESTHESIA N/A 08/31/2018   Procedure: IR WITH ANESTHESIA;  Surgeon: Radiologist, Medication, MD;  Location: [place];  Service: Radiology;  Laterality: N/A;    Social History:  Social History   Socioeconomic History  . Marital status: Single    Spouse name: Not on file  . Number of children: 0  . Years of education: Not on file  . Highest education level: Not on file  Occupational History  . Not on file  Social Needs  . Financial resource strain: Not on file  . Food insecurity    Worry: Not on file    Inability: Not on file  . Transportation needs    Medical: Not on file    Non-medical: Not on file  Tobacco Use  . Smoking status: Never Smoker  . Smokeless tobacco: Never Used  Substance and Sexual Activity  . Alcohol use: No  . Drug use: No  . Sexual activity: Never  Lifestyle  . Physical activity    Days per week: Not on file    Minutes per session: Not on file  . Stress: Not on file  Relationships  . Social Herbalist on phone: Not on file    Gets together: Not on file    Attends religious service: Not on file    Active member  of club or organization: Not on file    Attends meetings of clubs or organizations: Not on file    Relationship status: Not on file  . Intimate partner violence    Fear of current or ex partner: Not on file    Emotionally abused: Not on file    Physically abused: Not on file    Forced sexual activity: Not on file  Other Topics Concern  . Not on file  Social History Narrative   Lives at home w/ brother and his wife   Right-handed   Caffeine: occasional coffee or green tea    Family History:  Family History  Problem Relation Age of Onset  . Hypertension Father   . Hyperlipidemia Father   . Congestive Heart Failure Father 110       Deceased-1995  . Diabetes Mother   . Kidney failure Mother 62       Deceased-2005  . Hypertension Mother   . Congestive Heart Failure  Maternal Grandmother   . Heart failure Paternal Grandmother   . Heart attack Paternal Grandmother     Medications:   Current Outpatient Medications on File Prior to Visit  Medication Sig Dispense Refill  . amLODipine (NORVASC) 5 MG tablet Take 1 tablet (5 mg total) by mouth daily. 30 tablet   . aspirin EC 325 MG EC tablet Take 1 tablet (325 mg total) by mouth daily. 30 tablet 0  . atorvastatin (LIPITOR) 80 MG tablet Take 80 mg by mouth daily.    . Calcium-Vitamin D 600-200 MG-UNIT per tablet Take 1 tablet by mouth daily.    . clopidogrel (PLAVIX) 75 MG tablet Take 1 tablet (75 mg total) by mouth daily. 30 tablet 0  . divalproex (DEPAKOTE SPRINKLE) 125 MG capsule Take 8 capsules (1,000 mg total) by mouth every 12 (twelve) hours.    . feeding supplement, ENSURE ENLIVE, (ENSURE ENLIVE) LIQD Take 237 mLs by mouth 2 (two) times daily between meals. 237 mL 12  . fluticasone (FLONASE) 50 MCG/ACT nasal spray Place 1 spray into both nostrils daily.    Marland Kitchen lacosamide 150 MG TABS Take 1 tablet (150 mg total) by mouth 2 (two) times daily. 60 tablet   . levETIRAcetam (KEPPRA) 100 MG/ML solution Take 15 mLs (1,500 mg total) by mouth 2 (two) times daily. 473 mL 12  . lisinopril (ZESTRIL) 20 MG tablet Take 1 tablet (20 mg total) by mouth daily.    . metoprolol tartrate (LOPRESSOR) 50 MG tablet Take 1 tablet (50 mg total) by mouth 2 (two) times daily.    . Multiple Vitamin (MULTIVITAMIN WITH MINERALS) TABS tablet Take 1 tablet by mouth daily.    . Vitamin D, Cholecalciferol, 50 MCG (2000 UT) CAPS Take 2,000 Units by mouth daily.     No current facility-administered medications on file prior to visit.     Allergies:   Allergies  Allergen Reactions  . Dust Mite Extract Other (See Comments)    Allergy symptoms   . Pollen Extract Other (See Comments)    Allergy symptoms   . Rye Grass Flower Pollen Extract [Gramineae Pollens] Other (See Comments)    Allergy Symptoms      Physical Exam  There were  no vitals filed for this visit. There is no height or weight on file to calculate BMI. No exam data present  Depression screen [facility] 2/9 04/18/2015  Decreased Interest 0  Down, Depressed, Hopeless 0  PHQ - 2 Score 0     General: well  developed, well nourished, seated, in no evident distress Head: head normocephalic and atraumatic.   Neck: supple with no carotid or supraclavicular bruits Cardiovascular: regular rate and rhythm, no murmurs Musculoskeletal: no deformity Skin:  no rash/petichiae Vascular:  Normal pulses all extremities   Neurologic Exam Mental Status: Awake and fully alert. Oriented to place and time. Recent and remote memory intact. Attention span, concentration and fund of knowledge appropriate. Mood and affect appropriate.  Cranial Nerves: Fundoscopic exam reveals sharp disc margins. Pupils equal, briskly reactive to light. Extraocular movements full without nystagmus. Visual fields full to confrontation. Hearing intact. Facial sensation intact. Face, tongue, palate moves normally and symmetrically.  Motor: Normal bulk and tone. Normal strength in all tested extremity muscles. Sensory.: intact to touch , pinprick , position and vibratory sensation.  Coordination: Rapid alternating movements normal in all extremities. Finger-to-nose and heel-to-shin performed accurately bilaterally. Gait and Station: Arises from chair without difficulty. Stance is normal. Gait demonstrates normal stride length and balance Reflexes: 1+ and symmetric. Toes downgoing.     NIHSS  *** Modified Rankin  *** CHA2DS2-VASc *** HAS-BLED ***   Diagnostic Data (Labs, Imaging, Testing)  CT HEAD WO CONTRAST ***  CT ANGIO HEAD W OR WO CONTRAST CT ANGIO NECK W OR WO CONTRAST ***  MR BRAIN WO CONTRAST ***  MR MRA HEAD  MR MRA NECK ***  ECHOCARDIOGRAM ***    ASSESSMENT: Ronald [name] is a 73 y.o. year old male here with *** on *** secondary to ***. Vascular risk factors include ***.      PLAN:  1. *** : Continue {anticoagulants:31417}  and ***  for secondary stroke prevention. Maintain strict control of hypertension with blood pressure goal below 130/90, diabetes with hemoglobin A1c goal below 6.5% and cholesterol with LDL cholesterol (bad cholesterol) goal below 70 mg/dL.  I also advised the patient to eat a healthy diet with plenty of whole grains, cereals, fruits and vegetables, exercise regularly with at least 30 minutes of continuous activity daily and maintain ideal body weight. 2. HTN: Advised to continue current treatment regimen.  Today's BP ***.  Advised to continue to monitor at home along with continued follow-up with PCP for management 3. HLD: Advised to continue current treatment regimen along with continued follow-up with PCP for future prescribing and monitoring of lipid panel 4. DMII: Advised to continue to monitor glucose levels at home along with continued follow-up with PCP for management and monitoring    Follow up in *** or call earlier if needed   Greater than 50% of time during this 45 minute visit was spent on counseling, explanation of diagnosis of ***, reviewing risk factor management of ***, planning of further management along with potential future management, and discussion with patient and family answering all questions.    Venancio Poisson, AGNP-BC  [facility] [address] [place], [place] [address]  Phone [phone] Fax [phone] Note: This document was prepared with digital dictation and possible smart phrase technology. Any transcriptional errors that result from this process are unintentional.

## 2018-11-02 NOTE — Telephone Encounter (Signed)
Patient was a no call/no show for their appointment today.   

## 2018-11-06 ENCOUNTER — Encounter: Payer: Self-pay | Admitting: Adult Health

## 2018-12-14 ENCOUNTER — Ambulatory Visit (INDEPENDENT_AMBULATORY_CARE_PROVIDER_SITE_OTHER): Payer: Medicare HMO | Admitting: Adult Health

## 2018-12-14 ENCOUNTER — Encounter: Payer: Self-pay | Admitting: Adult Health

## 2018-12-14 ENCOUNTER — Other Ambulatory Visit: Payer: Self-pay

## 2018-12-14 ENCOUNTER — Encounter

## 2018-12-14 VITALS — BP 176/89 | HR 67 | Temp 97.7°F | Ht 66.0 in | Wt 220.0 lb

## 2018-12-14 DIAGNOSIS — Z79899 Other long term (current) drug therapy: Secondary | ICD-10-CM | POA: Diagnosis not present

## 2018-12-14 DIAGNOSIS — R4189 Other symptoms and signs involving cognitive functions and awareness: Secondary | ICD-10-CM

## 2018-12-14 DIAGNOSIS — I10 Essential (primary) hypertension: Secondary | ICD-10-CM

## 2018-12-14 DIAGNOSIS — E785 Hyperlipidemia, unspecified: Secondary | ICD-10-CM | POA: Diagnosis not present

## 2018-12-14 DIAGNOSIS — R569 Unspecified convulsions: Secondary | ICD-10-CM

## 2018-12-14 DIAGNOSIS — I6521 Occlusion and stenosis of right carotid artery: Secondary | ICD-10-CM

## 2018-12-14 DIAGNOSIS — I63231 Cerebral infarction due to unspecified occlusion or stenosis of right carotid arteries: Secondary | ICD-10-CM | POA: Diagnosis not present

## 2018-12-14 DIAGNOSIS — G8194 Hemiplegia, unspecified affecting left nondominant side: Secondary | ICD-10-CM

## 2018-12-14 DIAGNOSIS — I69398 Other sequelae of cerebral infarction: Secondary | ICD-10-CM

## 2018-12-14 DIAGNOSIS — R471 Dysarthria and anarthria: Secondary | ICD-10-CM

## 2018-12-14 NOTE — Patient Instructions (Addendum)
Needs follow up with vascular surgery - request facility to schedule appointment Dr. Estanislado Pandy - [phone]  Continue keppra, Depakote, and vimpat for seizure prevention   Highly recommend participation with PT/OT/SLP for ongoing deficits  Left knee pain post fall - further evaluation of potential injury of left knee during fall  Continue clopidogrel 75 mg daily  and lipitor  for secondary stroke prevention  Discontinue aspirin 81mg  at the time as 3 months dual antiplatelet therapy completed - continue on plavix alone  Continue to follow up with PCP regarding cholesterol and blood pressure management   Continue to monitor blood pressure at home  Maintain strict control of hypertension with blood pressure goal below 130/90, diabetes with hemoglobin A1c goal below 6.5% and cholesterol with LDL cholesterol (bad cholesterol) goal below 70 mg/dL. I also advised the patient to eat a healthy diet with plenty of whole grains, cereals, fruits and vegetables, exercise regularly and maintain ideal body weight.  Followup in the future with me in 3 months or call earlier if needed       Thank you for coming to see [place] at [facility]. I hope we have been able to provide you high quality care today.  You may receive a patient satisfaction survey over the next few weeks. We would appreciate your feedback and comments so that we may continue to improve ourselves and the health of our patients.

## 2018-12-14 NOTE — Progress Notes (Signed)
I agree with the above plan 

## 2018-12-14 NOTE — Progress Notes (Signed)
[facility] [address] [place]. [place] [address] [phone]       HOSPITAL FOLLOW UP NOTE  [name] Date of Birth:  [birth year] Medical Record Number:  [id number]   Reason for Referral:  hospital stroke follow up    CHIEF COMPLAINT:  Chief Complaint  Patient presents with   Follow-up    Room 9, with sister.Hospital f/u (Ischemic CVA). 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  History obtained from sister, patient and chart review. Reviewed all radiology images and labs personally.  Ronald Hairstonis a 73 y.o.malewith history of stroke, schizophrenia, measles, hypertension, hyperlipidemia, depression, dementia. Patient was found at his home sitting in a chair when he suddenly slumped over to the left and was noted to be flaccid on the left with right gaze deviation along with dysarthric. EMS was called and patient was immediately brought as a code stroke to [facility]. Patient is unable to give history at this time. Patient with recent admission 04/2018 with TIA d/t R ICA occlusion in setting of hypotension, told to avoid low BP w/ goal 130-160. He was LKW at 12:00 on 08/31/2018. Premorbid modified Rankin scale (mRS):2. NIH stroke scale 21.  CT head negative for hemorrhage.  IV tPA was administered. Taken to IR where he had angioplasty with TICI 3 revascularization without evidence of thrombus.  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  MRI showed widespread gyriform restricted diffusion in the right hemisphere compatible with cortical ischemia.  MRA showed putative but patent right ICA, although focal loss of signal in the  supraclinoid segment raising the possibility of residual stenosis, poor flow in the right PCA which demonstrated severe stenosis by CTA prior and faint flow signal in the right vertebral artery V4 segment despite chronic right vertebral occlusion.  2D echo normal EF without cardiac source of this identified.  LDL 53.  A1c 5.1.  Initiated DAPT for 3 months then Plavix alone.  Recommended follow-up with vascular surgery for elective right ICA stenting in 4 to 6 weeks.  HTN stable with BP goal 130-150 range.  Recommended continuation of atorvastatin 80 mg daily for HLD management.  Other stroke risk factors include advanced age, obesity and prior history of stroke.  Other active problems include dementia at baseline, bipolar disorder, prostate cancer, and malnutrition due to inadequate oral intake.  Residual deficits of moderate dysarthria, left hemiparesis and dysphagia and discharged to SNF for ongoing therapies as insurance declined CIR. He returned to ED on 09/29/2018 with altered mental status and seizure-like activity.  Initiated Keppra 1500 mg twice daily, Vimpat 150 mg twice daily and Depakote 1000 mg twice daily.  Imaging unremarkable for acute abnormality.  Prolonged hospital stay due to difficulties with SNF placement.  He was discharged on 10/16/2018 to [facility].   Ronald [name] is being seen today for hospital follow-up accompanied by his sister with transportation from facility to office provided via EMS.  Residual deficits of left hemiparesis, dysphasia, dysarthria and cognitive deficits.  Initially receiving therapies but per sister, currently on hold until this visit for recommendations for potential ongoing need of therapy.  He is currently nonambulatory with use of lift for transfers.  He is currently on a stretcher as he has difficulty sitting up in a chair due to leaning forward.  Sister unable to provide further information regarding current cognition with potential improvement as she is unable  to frequently visit him due to COVID-19 restrictions.  Sister reports increased left knee pain that has been present since his seizure.  He was found down by facility staff and she believes he could have potentially fell onto his left knee.  He has continued on Keppra 1500 mg twice daily, Vimpat 150 mg twice daily and Depakote 1000 mg twice daily without recurrent seizure activity.  He continues on aspirin and Plavix without bleeding or bruising.  Continues on atorvastatin without myalgias.  Blood pressure today elevated at 176/89.  Blood pressure monitored at facility with typical range 140/70s with occasional elevated readings.  HTN and HLD monitored by facility provider.  He has not had follow-up with vascular surgery at this time for unclear reasons.  Could potentially be due to prolonged recent hospital admission with seizure activity and difficulty with placement.  Denies new or worsening stroke/TIA symptoms.   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[place])    Symptoms w/depression   Stroke ([place])     PSH:  Past Surgical History:  Procedure Laterality Date   IR ANGIO EXTERNAL CAROTID SEL EXT CAROTID UNI R MOD SED  07/20/2016   IR ANGIO INTRA EXTRACRAN SEL COM CAROTID INNOMINATE UNI L MOD SED  08/31/2018   IR ANGIO INTRA EXTRACRAN SEL INTERNAL CAROTID BILAT MOD SED  07/20/2016   IR ANGIO VERTEBRAL SEL SUBCLAVIAN INNOMINATE UNI L MOD SED  07/20/2016   IR ANGIOGRAM EXTREMITY RIGHT  07/20/2016   IR CT HEAD LTD  08/31/2018   IR PERCUTANEOUS ART THROMBECTOMY/INFUSION INTRACRANIAL INC DIAG ANGIO  08/31/2018   RADIOLOGY WITH ANESTHESIA N/A 08/31/2018   Procedure: IR WITH  ANESTHESIA;  Surgeon: Radiologist, Medication, MD;  Location: [facility];  Service: Radiology;  Laterality: N/A;    Social History:  Social History   Socioeconomic History   Marital status: Single    Spouse name: Not on file   Number of children: 0   Years of education: Not on file   Highest education level: Not on file  Occupational History   Not on file  Social Needs   Financial resource strain: Not on file   Food insecurity    Worry: Not on file    Inability: Not on file   Transportation needs    Medical: Not on file    Non-medical: Not on file  Tobacco Use   Smoking status: Never Smoker   Smokeless tobacco: Never Used  Substance and Sexual Activity   Alcohol use: No   Drug use: No   Sexual activity: Never  Lifestyle   Physical activity    Days per week: Not on file    Minutes per session: Not on file   Stress: Not on file  Relationships   Social connections    Talks on phone: Not on file    Gets together: Not on file  Attends religious service: Not on file    Active member of club or organization: Not on file    Attends meetings of clubs or organizations: Not on file    Relationship status: Not on file   Intimate partner violence    Fear of current or ex partner: Not on file    Emotionally abused: Not on file    Physically abused: Not on file    Forced sexual activity: Not on file  Other Topics Concern   Not on file  Social History Narrative   Lives at home w/ brother and his wife   Right-handed   Caffeine: occasional coffee or green tea    Family History:  Family History  Problem Relation Age of Onset   Hypertension Father    Hyperlipidemia Father    Congestive Heart Failure Father 39       Deceased-23   Diabetes Mother    Kidney failure Mother 64       Deceased-2005   Hypertension Mother    Congestive Heart Failure Maternal Grandmother    Heart failure Paternal Grandmother    Heart attack Paternal Grandmother      Medications:   Current Outpatient Medications on File Prior to Visit  Medication Sig Dispense Refill   amLODipine (NORVASC) 5 MG tablet Take 1 tablet (5 mg total) by mouth daily. 30 tablet    atorvastatin (LIPITOR) 80 MG tablet Take 80 mg by mouth daily.     Calcium-Vitamin D 600-200 MG-UNIT per tablet Take 1 tablet by mouth daily.     clopidogrel (PLAVIX) 75 MG tablet Take 1 tablet (75 mg total) by mouth daily. 30 tablet 0   divalproex (DEPAKOTE SPRINKLE) 125 MG capsule Take 8 capsules (1,000 mg total) by mouth every 12 (twelve) hours.     feeding supplement, ENSURE ENLIVE, (ENSURE ENLIVE) LIQD Take 237 mLs by mouth 2 (two) times daily between meals. 237 mL 12   fluticasone (FLONASE) 50 MCG/ACT nasal spray Place 1 spray into both nostrils daily.     lacosamide 150 MG TABS Take 1 tablet (150 mg total) by mouth 2 (two) times daily. 60 tablet    levETIRAcetam (KEPPRA) 100 MG/ML solution Take 15 mLs (1,500 mg total) by mouth 2 (two) times daily. 473 mL 12   lisinopril (ZESTRIL) 20 MG tablet Take 1 tablet (20 mg total) by mouth daily.     metoprolol tartrate (LOPRESSOR) 50 MG tablet Take 1 tablet (50 mg total) by mouth 2 (two) times daily.     Multiple Vitamin (MULTIVITAMIN WITH MINERALS) TABS tablet Take 1 tablet by mouth daily.     Vitamin D, Cholecalciferol, 50 MCG (2000 UT) CAPS Take 2,000 Units by mouth daily.     diclofenac sodium (VOLTAREN) 1 % GEL      No current facility-administered medications on file prior to visit.     Allergies:   Allergies  Allergen Reactions   Dust Mite Extract Other (See Comments)    Allergy symptoms    Pollen Extract Other (See Comments)    Allergy symptoms    Rye Grass Flower Pollen Extract [Gramineae Pollens] Other (See Comments)    Allergy Symptoms      Physical Exam  Vitals:   12/14/18 1126  BP: (!) 176/89  Pulse: 67  Temp: 97.7 F (36.5 C)  Weight: 220 lb (99.8 kg)  Height: 5\' 6"  (1.676 m)   Body mass index is  35.51 kg/m. No exam data present  Unable to adequately assess PHQ  9 due to cognition  General: Frail pleasant elderly African-American male, laying on stretcher, in no evident distress Head: head normocephalic and atraumatic.   Neck: supple with no carotid or supraclavicular bruits Cardiovascular: regular rate and rhythm, no murmurs Musculoskeletal: no deformity Skin:  no rash/petichiae Vascular:  Normal pulses all extremities   Neurologic Exam Mental Status: Awake and fully alert. Oriented to time but disoriented to place. Recent and remote memory diminished. Attention span, concentration and fund of knowledge diminished.  Occasionally answer questions appropriately but majority of visit consisted of him speaking of random topics.  Moderate dysarthria.  Mood and affect appropriate.  Cranial Nerves: Fundoscopic exam reveals sharp disc margins. Pupils equal, briskly reactive to light. Extraocular movements full without nystagmus. Visual fields full to confrontation. Hearing intact. Facial sensation intact.  Left lower facial paralysis. Motor:  LUE: 1/5 with spasticity LLE: 2/5 with spasticity; assessment limited due to increased pain with movement Full strength right upper and lower extremity Sensory.: intact to touch , pinprick , position and vibratory sensation.  Coordination: Rapid alternating movements normal on right side. Finger-to-nose and heel-to-shin performed accurately on right side.  Unable to perform coordination tasks on left side Gait and Station: Deferred as patient nonambulatory Reflexes: 2+ LUE and LLE ; 1+ RUE and RLE. Toes downgoing.    NIHSS  8 Modified Rankin  4    Diagnostic Data (Labs, Imaging, Testing)  Ct Head Code Stroke Wo Contrast 08/31/2018 1. Stable non contrast CT appearance of the brain since February. Chronic right parietal lobe encephalomalacia. ASPECTS is 10.   Ct Angio Head W Or Wo Contrast Ct Angio Neck W Or Wo Contrast 08/31/2018 1. Improved  cervical right ICA and right siphon since the CT in February, however there is persistent occlusion of the right ICA at the ophthalmic artery origin. Stable right ICA terminus and right MCA branches 2. However, there is new stenosis of the Right ACA A2, at least moderate. 3. Otherwise stable intracranial CTA findings since February, including: - chronic right vertebral artery occlusion with faint reconstitution at the skull base. -severe right PCA P1 stenosis.   Ct Cerebral Perfusion W Contrast 08/31/2018 Largely unchanged CT perfusion abnormality in the right hemisphere since the February CTP, with actually slightly improved right hemisphere perfusion parameters in terms of CBF and the hypoperfusion index since that time. Electronically Signed By: Genevie Ann M.D. On: 08/31/2018 14:49   Cerebral Angio 08/31/2018 S/P bilateral common carotid arteriograms,followed by balloon angioplasty of occluded supraclinoid RT ICA with RT MCA TICI 3 revascularization  MR MRA Head Wo Contrast 09/01/2018 IMPRESSION: 1. Widespread gyriform restricted diffusion in the right hemisphere compatible with cortical ischemia.No associated hemorrhage or mass effect. No other areas of acute brain infarct.  2. Intracranial MRA reveals a diminutive but patent right ICA, although focal loss of flow signal in the supraclinoid segment raising the possibility of residual stenosis.  3. The visible right MCA and right ACA appear patent without stenosis - including the right A2 segment which was thought to be stenotic by CTA yesterday.  4.Poor flow in the right PCA, which demonstrated severe stenosis by CTA yesterday. There is some faint flow signal in the right vertebral artery V4 segment despite chronic right vertebral occlusion.  Transthoracic Echocardiogram  09/01/2018 IMPRESSIONS 1. The left ventricle has normal systolic function, with an ejection fraction of 55-60%. The cavity size was normal. There is mildly increased  left ventricular wall thickness. Left ventricular diastolic Doppler parameters are consistent with impaired  relaxation.  2. The right ventricle has normal systolic function. The cavity was normal. There is no increase in right ventricular wall thickness. 3. Left atrial size was mildly dilated. 4. Trivial pericardial effusion is present. 5. The mitral valve is abnormal. Mild thickening of the mitral valve leaflet. 6. The aortic valve is tricuspid. Mild thickening of the aortic valve. Aortic valve regurgitation is trivial by color flow Doppler. 7. The aortic root is normal in size and structure. 8. The inferior vena cava was normal in size with <50% respiratory variability. 9. The interatrial septum was not assessed.  Dg Chest Port 1 View 09/03/2018 Improved aeration in the right lung. Probable atelectasis in theRight hilum   09/01/2018 Endotracheal tube in grossly good position. Nasogastric tube has been partially withdrawn, with distal tip in expected position of proximal thoracic esophagus; advancement is recommended. Mild right basilar subsegmental atelectasis.  08/31/2018 1. The endotracheal tube is in good position, 2.5 cm above the carina. 2. The NG tube could be advanced several cm. 3. No acute cardiopulmonary findings.     ASSESSMENT: Ronald [name] is a 73 y.o. year old male presented with left-sided weakness, right gaze deviation and dysarthria on 08/31/2018 with stroke work-up revealing right MCA infarct status post TPA and angioplasty right terminal ICA secondary to large vessel disease.  On 09/29/2018, patient presented to ED with seizure activity likely secondary to right MCA stroke.  Vascular risk factors include right ICA occlusion, prior stroke and TIA, HTN, HLD, advanced age and obesity.  Residual stroke deficits of left hemiparesis, dysphagia, dysarthria, left facial droop and cognitive deficits.    PLAN:  1. Right MCA stroke: Continue clopidogrel 75 mg daily  and  atorvastatin for secondary stroke prevention.  Advised that aspirin could be discontinued as 71-month DAPT completed and no indication for prolonged therapy.  Maintain strict control of hypertension with blood pressure goal between 130-150, diabetes with hemoglobin A1c goal below 6.5% and cholesterol with LDL cholesterol (bad cholesterol) goal below 70 mg/dL.  I also advised the patient to eat a healthy diet with plenty of whole grains, cereals, fruits and vegetables, exercise regularly with at least 30 minutes of continuous activity daily and maintain ideal body weight. 2. Right ICA occlusion: Discussion with sister regarding need of vascular surgery follow-up visit for potential stent placement.  Provided office number to sister and facility to ensure scheduling of visit.  Ensure BP 130-150 to ensure adequate perfusion 3. Seizures, post stroke: Continue Keppra 1500 mg twice daily, Depakote 1000 mg twice daily and lacosamide 150 mg twice daily. 4. HTN: Advised to continue current treatment regimen.   Advised to continue to monitor at home along with continued follow-up with PCP for management 5. HLD: Advised to continue current treatment regimen along with continued follow-up with PCP for future prescribing and monitoring of lipid panel 6. Residual deficits: Highly encouraged restart of PT/OT/SLP for potential ongoing improvement 7. Left knee pain: Deferred to facility for further evaluation  *Lamotrigine level obtained in error after visit as Depakote level should have been obtained.  Disregard lamotrigine level result.  Will obtain Depakote level at next visit    Follow up in 3 months or call earlier if needed   Greater than 50% of time during this 45 minute visit was spent on counseling, explanation of diagnosis of right MCA stroke, reviewing risk factor management of right ICA occlusion, HTN, HLD, discussion regarding needed follow-up with vascular surgery, discussion regarding ongoing AEDs for  seizure prophylaxis, planning of further management along  with potential future management, and discussion with patient and family answering all questions.    Frann Rider, AGNP-BC  [facility] [address] [place], [place] [address]  Phone [phone] Fax [phone] Note: This document was prepared with digital dictation and possible smart phrase technology. Any transcriptional errors that result from this process are unintentional.

## 2018-12-15 LAB — LAMOTRIGINE LEVEL: Lamotrigine Lvl: <1 ug/mL — ABNORMAL LOW (ref 2.0–20.0)

## 2018-12-17 ENCOUNTER — Encounter: Payer: Self-pay | Admitting: Adult Health

## 2018-12-18 NOTE — Progress Notes (Signed)
I agree with the above plan 

## 2018-12-20 ENCOUNTER — Telehealth: Payer: Self-pay

## 2018-12-20 NOTE — Telephone Encounter (Signed)
Patients sister called and stated that we were supposed to make a referral for a vascular surgeon. She stated that we sent him home with a phone number to call but it was not to a vascular surgeon. Please call and advise, she wants a referral put in. DW

## 2018-12-20 NOTE — Telephone Encounter (Signed)
Called [phone] and that was [facility] (they do billing, [facility]), but they were able to give me Anderson Malta, scheduler's # for Dr. Estanislado Pandy.  [phone], and I did call and give this # to [facility], sister of pt.  She will call them to set up.

## 2018-12-20 NOTE — Telephone Encounter (Signed)
A referral does not need to be placed and here is the office number to schedule follow-up with Dr. Estanislado Pandy  [phone]

## 2018-12-21 ENCOUNTER — Other Ambulatory Visit: Payer: Self-pay

## 2018-12-21 NOTE — Patient Outreach (Signed)
First attempt to obtain mRs. No answer. Patient resides at [facility] and does not have a DPR on file. I was transferred to the nursing station where nobody answered the phone. They do not have a voicemail.

## 2018-12-22 ENCOUNTER — Telehealth (HOSPITAL_COMMUNITY): Payer: Self-pay | Admitting: Radiology

## 2018-12-22 NOTE — Telephone Encounter (Signed)
Spoke with pt's sister to try and schedule treatment with Dr. Estanislado Pandy. Pt's sister would like a phone call with Deveshwar prior to scheduling treatment. Sent message to PA, Radonna Ricker. JM

## 2018-12-26 ENCOUNTER — Other Ambulatory Visit: Payer: Self-pay

## 2018-12-26 ENCOUNTER — Telehealth: Payer: Self-pay | Admitting: Student

## 2018-12-26 NOTE — Progress Notes (Signed)
NIR.  Patient with history of right ICA supraclinoid segment acute symptomatic stenosis s/p emergent balloon angioplasty 08/31/2018 by Dr. Estanislado Pandy. Current treatment plan includes possible endovascular revascularization (angioplasty/stent placement) of right ICA supraclinoid segment stenosis.  Received message from patient's sister, Quintin Alto, requesting that Dr. Estanislado Pandy speak with her regarding her brother's care.  Dr. Estanislado Pandy and I called patient's sister [phone]) at [phone] to discuss patient. Quintin Alto states that her brother is doing ok. States that he currently resides at a SNF, as he requires "around the clock care". She states that he needs assistance walking/ambulating, bathing, eating, ect. Based on patient's current clinical status, Dr. Estanislado Pandy feels that patient is not fit for general anesthesia (which is required for this procedure) at this time. Recommend waiting for clinical improvement prior to scheduling procedure. Per Dr. Estanislado Pandy, our schedulers to call patient in 3 months to assess clinical picture/symptoms, and reassess for possible procedure (based on clinical picture) at that time.  All questions answered and concerns addressed. Patient's sister conveys understanding and agrees with plan.   Bea Graff Louk, PA-C 12/26/2018, 3:32 PM

## 2018-12-26 NOTE — Patient Outreach (Signed)
Second attempt to obtain mRs. No answer. Left message for return call.  

## 2018-12-28 ENCOUNTER — Other Ambulatory Visit: Payer: Self-pay

## 2018-12-28 NOTE — Patient Outreach (Signed)
3 outreach attempts were completed to obtain mRs. mRs could not be obtained because patient never returned my calls. MRs=7  Barbaraann Rondo listed on DPR but no number in account.

## 2019-03-13 ENCOUNTER — Telehealth: Payer: Self-pay | Admitting: Adult Health

## 2019-03-13 NOTE — Telephone Encounter (Signed)
Ronald [name] @[facility]  has called for a DOXY.Me vv for pt the email has been  Sent to her and she confirmed getting it at [email or web address]

## 2019-03-14 NOTE — Telephone Encounter (Signed)
Noted  

## 2019-03-15 ENCOUNTER — Other Ambulatory Visit: Payer: Self-pay

## 2019-03-15 ENCOUNTER — Telehealth (INDEPENDENT_AMBULATORY_CARE_PROVIDER_SITE_OTHER): Payer: Medicare Other | Admitting: Adult Health

## 2019-03-15 VITALS — BP 146/76 | HR 67 | Temp 98.4°F | Resp 18

## 2019-03-15 DIAGNOSIS — E785 Hyperlipidemia, unspecified: Secondary | ICD-10-CM

## 2019-03-15 DIAGNOSIS — I63231 Cerebral infarction due to unspecified occlusion or stenosis of right carotid arteries: Secondary | ICD-10-CM | POA: Diagnosis not present

## 2019-03-15 DIAGNOSIS — I6521 Occlusion and stenosis of right carotid artery: Secondary | ICD-10-CM

## 2019-03-15 DIAGNOSIS — R569 Unspecified convulsions: Secondary | ICD-10-CM

## 2019-03-15 DIAGNOSIS — I6930 Unspecified sequelae of cerebral infarction: Secondary | ICD-10-CM

## 2019-03-15 DIAGNOSIS — I10 Essential (primary) hypertension: Secondary | ICD-10-CM | POA: Diagnosis not present

## 2019-03-15 DIAGNOSIS — I69398 Other sequelae of cerebral infarction: Secondary | ICD-10-CM

## 2019-03-15 MED ORDER — LACOSAMIDE 200 MG PO TABS: 200.0000 mg | ORAL_TABLET | Freq: Two times a day (BID) | ORAL | 3 refills | Status: AC

## 2019-03-15 NOTE — Progress Notes (Signed)
I agree with the above plan 

## 2019-03-15 NOTE — Progress Notes (Signed)
[facility] [address] [place]. [place] [address] [phone]       VIRTUAL VISIT FOLLOW UP NOTE  [name] Date of Birth:  07/25/1945 Medical Record Number:  [id number]   Reason for Referral:  stroke follow up  Virtual Visit via Video Note  I connected with Ronald [name] on 03/15/19 at 10:15 AM EST by a video enabled telemedicine application located at [facility] and verified that I am speaking with the correct person using two identifiers who was located at the SNF accompanied by facility NP [name] and his sister via speaker phone   [name] (Web designer) schedule visit who discussed the limitations of evaluation and management by telemedicine and the availability of in person appointments. The patient expressed understanding and agreed to proceed.    HPI:  Stroke admission 08/2018: Ronald [name] a 73 y.o.malewith history of stroke, schizophrenia, measles, hypertension, hyperlipidemia, depression, dementia. Patient was found at his home sitting in a chair when he suddenly slumped over to the left and was noted to be flaccid on the left with right gaze deviation along with dysarthric. 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 Patient is unable to give history at this time. Patient with recent admission 04/2018 with TIA d/t R ICA occlusion in setting of hypotension, told to avoid low BP w/ goal 130-160. He was LKW at 12:00 on 08/31/2018. Premorbid modified Rankin scale (mRS):2. NIH stroke scale 21.  CT head negative for hemorrhage.  IV tPA was administered. Taken to IR where he had angioplasty with TICI 3 revascularization without evidence of thrombus.  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  MRI showed  widespread gyriform restricted diffusion in the right hemisphere compatible with cortical ischemia.  MRA showed putative but patent right ICA, although focal loss of signal in the supraclinoid segment raising the possibility of residual stenosis, poor flow in the right PCA which demonstrated severe stenosis by CTA prior and faint flow signal in the right vertebral artery V4 segment despite chronic right vertebral occlusion.  2D echo normal EF without cardiac source of this identified.  LDL 53.  A1c 5.1.  Initiated DAPT for 3 months then Plavix alone.  Recommended follow-up with vascular surgery for elective right ICA stenting in 4 to 6 weeks.  HTN stable with BP goal 130-150 range.  Recommended continuation of atorvastatin 80 mg daily for HLD management.  Other stroke risk factors include advanced age, obesity and prior history of stroke.  Other active problems include dementia at baseline, bipolar disorder, prostate cancer, and malnutrition due to inadequate oral intake.  Residual deficits of moderate dysarthria, left hemiparesis and dysphagia and discharged to SNF for ongoing therapies as insurance declined CIR. He returned to ED on 09/29/2018 with altered mental status and seizure-like activity.  Initiated Keppra 1500 mg twice daily, Vimpat 150 mg twice daily and Depakote 1000 mg twice daily.  Imaging unremarkable for acute abnormality.  Prolonged hospital stay due to difficulties with SNF placement.  He was discharged on 10/16/2018 to [facility].   Initial visit 12/14/2018: Ronald [name] is being seen today for hospital follow-up accompanied by his sister with transportation from facility to office provided via EMS.  Residual deficits of left hemiparesis, dysphasia, dysarthria and cognitive deficits.  Initially receiving therapies but per sister, currently on hold  until this visit for recommendations for potential ongoing need of therapy.  He is currently nonambulatory with use of lift for transfers.  He is  currently on a stretcher as he has difficulty sitting up in a chair due to leaning forward.  Sister unable to provide further information regarding current cognition with potential improvement as she is unable to frequently visit him due to COVID-19 restrictions.  Sister reports increased left knee pain that has been present since his seizure.  He was found down by facility staff and she believes he could have potentially fell onto his left knee.  He has continued on Keppra 1500 mg twice daily, Vimpat 150 mg twice daily and Depakote 1000 mg twice daily without recurrent seizure activity.  He continues on aspirin and Plavix without bleeding or bruising.  Continues on atorvastatin without myalgias.  Blood pressure today elevated at 176/89.  Blood pressure monitored at facility with typical range 140/70s with occasional elevated readings.  HTN and HLD monitored by facility provider.  He has not had follow-up with vascular surgery at this time for unclear reasons.  Could potentially be due to prolonged recent hospital admission with seizure activity and difficulty with placement.  Denies new or worsening stroke/TIA symptoms.  Update 03/15/2019: Ronald [name] is being seen today via virtual visit for stroke follow-up accompanied by [name], NP and his sister on speaker phone.  Initially scheduled for office visit but as he continues to reside at [facility] facility requested virtual visit due to COVID-19 precautions.  Residual deficits left hemiparesis, dysphagia, dysarthria and cognitive deficits.  He has continued on Keppra 1500 mg twice daily, Vimpat 150 mg twice daily and Depakote 1000 mg twice daily but per sister and nursing staff, he was noted to have full body twitching L>R on 03/09/2019 and patient reporting he felt he was going to have a seizure.  Nursing staff administered Ativan with seizure cessation.  He then had the same type of event on 03/12/2019 and again subsided with Ativan.  [name], NP did  state that he was recently treated with antibiotics for stage IV sacral ulcer but otherwise no acute infection or illness. Continues on aspirin and Plavix without bleeding or bruising.  Continues on atorvastatin without myalgias.  Blood pressure has been stable with today's blood pressure 146/76 and routinely monitored at facility.  Blood work routinely obtained at facility which has been unremarkable.  Sister did reach out to vascular surgery due to possible treatment plan of endovascular revascularization of right ICA supraclinoid segment stenosis but due to patient's current clinical status, Dr. Estanislado Pandy did not feel patient appropriate for general anesthesia and recommended waiting for clinical improvement prior to scheduling.  No further concerns at this time.       ROS:   14 system review of systems performed and negative with exception of seizures, speech difficulty, confusion, memory loss, weakness, and pain  PMH:  Past Medical History:  Diagnosis Date  . Cancer [facility])    Prostate  . Chicken pox   . Dementia ([place])   . Depression   . Heart disease   . Hyperlipemia   . Hypertension   . Measles   . Mumps   . 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 Stroke [facility])     PSH:  Past Surgical History:  Procedure Laterality Date  . IR ANGIO EXTERNAL CAROTID SEL EXT CAROTID UNI R MOD SED  07/20/2016  . IR ANGIO INTRA EXTRACRAN SEL COM CAROTID INNOMINATE  UNI L MOD SED  08/31/2018  . IR ANGIO INTRA EXTRACRAN SEL INTERNAL CAROTID BILAT MOD SED  07/20/2016  . IR ANGIO VERTEBRAL SEL SUBCLAVIAN INNOMINATE UNI L MOD SED  07/20/2016  . IR ANGIOGRAM EXTREMITY RIGHT  07/20/2016  . IR CT HEAD LTD  08/31/2018  . IR PERCUTANEOUS ART THROMBECTOMY/INFUSION INTRACRANIAL INC DIAG ANGIO  08/31/2018  . RADIOLOGY WITH ANESTHESIA N/A 08/31/2018   Procedure: IR WITH ANESTHESIA;  Surgeon: Radiologist, Medication, MD;  Location: [place];  Service: Radiology;  Laterality: N/A;    Social History:    Social History   Socioeconomic History  . Marital status: Single    Spouse name: Not on file  . Number of children: 0  . Years of education: Not on file  . Highest education level: Not on file  Occupational History  . Not on file  Tobacco Use  . Smoking status: Never Smoker  . Smokeless tobacco: Never Used  Substance and Sexual Activity  . Alcohol use: No  . Drug use: No  . Sexual activity: Never  Other Topics Concern  . Not on file  Social History Narrative   Lives at home w/ brother and his wife   Right-handed   Caffeine: occasional coffee or green tea   Social Determinants of Health   Financial Resource Strain:   . Difficulty of Paying Living Expenses: Not on file  Food Insecurity:   . Worried About Charity fundraiser in the Last Year: Not on file  . Ran Out of Food in the Last Year: Not on file  Transportation Needs:   . Lack of Transportation (Medical): Not on file  . Lack of Transportation (Non-Medical): Not on file  Physical Activity:   . Days of Exercise per Week: Not on file  . Minutes of Exercise per Session: Not on file  Stress:   . Feeling of Stress : Not on file  Social Connections:   . Frequency of Communication with Friends and Family: Not on file  . Frequency of Social Gatherings with Friends and Family: Not on file  . Attends Religious Services: Not on file  . Active Member of Clubs or Organizations: Not on file  . Attends Archivist Meetings: Not on file  . Marital Status: Not on file  Intimate Partner Violence:   . Fear of Current or Ex-Partner: Not on file  . Emotionally Abused: Not on file  . Physically Abused: Not on file  . Sexually Abused: Not on file    Family History:  Family History  Problem Relation Age of Onset  . Hypertension Father   . Hyperlipidemia Father   . Congestive Heart Failure Father 55       Deceased-1995  . Diabetes Mother   . Kidney failure Mother 49       Deceased-2005  . Hypertension Mother   .  Congestive Heart Failure Maternal Grandmother   . Heart failure Paternal Grandmother   . Heart attack Paternal Grandmother     Medications:   Current Outpatient Medications on File Prior to Visit  Medication Sig Dispense Refill  . amLODipine (NORVASC) 5 MG tablet Take 1 tablet (5 mg total) by mouth daily. 30 tablet   . atorvastatin (LIPITOR) 80 MG tablet Take 80 mg by mouth daily.    . Calcium-Vitamin D 600-200 MG-UNIT per tablet Take 1 tablet by mouth daily.    . clopidogrel (PLAVIX) 75 MG tablet Take 1 tablet (75 mg total) by mouth daily. 30 tablet  0  . diclofenac sodium (VOLTAREN) 1 % GEL     . divalproex (DEPAKOTE SPRINKLE) 125 MG capsule Take 8 capsules (1,000 mg total) by mouth every 12 (twelve) hours.    . feeding supplement, ENSURE ENLIVE, (ENSURE ENLIVE) LIQD Take 237 mLs by mouth 2 (two) times daily between meals. 237 mL 12  . fluticasone (FLONASE) 50 MCG/ACT nasal spray Place 1 spray into both nostrils daily.    Marland Kitchen lacosamide 150 MG TABS Take 1 tablet (150 mg total) by mouth 2 (two) times daily. 60 tablet   . levETIRAcetam (KEPPRA) 100 MG/ML solution Take 15 mLs (1,500 mg total) by mouth 2 (two) times daily. 473 mL 12  . lisinopril (ZESTRIL) 20 MG tablet Take 1 tablet (20 mg total) by mouth daily.    . metoprolol tartrate (LOPRESSOR) 50 MG tablet Take 1 tablet (50 mg total) by mouth 2 (two) times daily.    . Multiple Vitamin (MULTIVITAMIN WITH MINERALS) TABS tablet Take 1 tablet by mouth daily.    . Vitamin D, Cholecalciferol, 50 MCG (2000 UT) CAPS Take 2,000 Units by mouth daily.     No current facility-administered medications on file prior to visit.    Allergies:   Allergies  Allergen Reactions  . Dust Mite Extract Other (See Comments)    Allergy symptoms   . Pollen Extract Other (See Comments)    Allergy symptoms   . Rye Grass Flower Pollen Extract [Gramineae Pollens] Other (See Comments)    Allergy Symptoms      Physical Exam  Unable to adequately perform  exam/assessment due to visit type and patient's cognitive impairment.  He was perseverating on looking for his pocket radio with evidence of ongoing moderate dysarthria and cognitive impairment.  [name], NP did state that he continues to have left-sided weakness  Vitals:   03/15/19 1032  BP: (!) 146/76  Pulse: 67  Resp: 18  Temp: 98.4 F (36.9 C)  SpO2: 97%   There is no height or weight on file to calculate BMI. No exam data present    Diagnostic Data (Labs, Imaging, Testing)  Ct Head Code Stroke Wo Contrast 08/31/2018 1. Stable non contrast CT appearance of the brain since February. Chronic right parietal lobe encephalomalacia. ASPECTS is 10.   Ct Angio Head W Or Wo Contrast Ct Angio Neck W Or Wo Contrast 08/31/2018 1. 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 Stable right ICA terminus and right MCA branches 2. However, there is new stenosis of the Right ACA A2, at least moderate. 3. Otherwise stable intracranial CTA findings since February, including: - chronic right vertebral artery occlusion with faint reconstitution at the skull base. -severe right PCA P1 stenosis.   Ct Cerebral Perfusion W Contrast 08/31/2018 Largely unchanged CT perfusion abnormality in the right hemisphere since the February CTP, with actually slightly improved right hemisphere perfusion parameters in terms of CBF and the hypoperfusion index since that time. Electronically Signed By: Genevie Ann M.D. On: 08/31/2018 14:49   Cerebral Angio 08/31/2018 S/P bilateral common carotid arteriograms,followed by balloon angioplasty of occluded supraclinoid RT ICA with RT MCA TICI 3 revascularization  MR MRA Head Wo Contrast 09/01/2018 IMPRESSION: 1. Widespread gyriform restricted diffusion in the right hemisphere compatible with cortical ischemia.No associated hemorrhage or mass effect. No other areas of  acute brain infarct.  2. Intracranial MRA reveals a diminutive but patent right ICA, although focal  loss of flow signal in the supraclinoid segment raising the possibility of residual stenosis.  3. The visible right MCA and right ACA appear patent without stenosis - including the right A2 segment which was thought to be stenotic by CTA yesterday.  4.Poor flow in the right PCA, which demonstrated severe stenosis by CTA yesterday. There is some faint flow signal in the right vertebral artery V4 segment despite chronic right vertebral occlusion.  Transthoracic Echocardiogram  09/01/2018 IMPRESSIONS 1. The left ventricle has normal systolic function, with an ejection fraction of 55-60%. The cavity size was normal. There is mildly increased left ventricular wall thickness. Left ventricular diastolic Doppler parameters are consistent with impaired  relaxation. 2. The right ventricle has normal systolic function. The cavity was normal. There is no increase in right ventricular wall thickness. 3. Left atrial size was mildly dilated. 4. Trivial pericardial effusion is present. 5. The mitral valve is abnormal. Mild thickening of the mitral valve leaflet. 6. The aortic valve is tricuspid. Mild thickening of the aortic valve. Aortic valve regurgitation is trivial by color flow Doppler. 7. The aortic root is normal in size and structure. 8. The inferior vena cava was normal in size with <50% respiratory variability. 9. The interatrial septum was not assessed.  Dg Chest Port 1 View 09/03/2018 Improved aeration in the right lung. Probable atelectasis in theRight hilum   09/01/2018 Endotracheal tube in grossly good position. Nasogastric tube has been partially withdrawn, with distal tip in expected position of proximal thoracic esophagus; advancement is recommended. Mild right basilar subsegmental atelectasis.  08/31/2018 1. The endotracheal tube is in good position, 2.5 cm above the  carina. 2. The NG tube could be advanced several cm. 3. No acute cardiopulmonary findings.     ASSESSMENT: Ronald [name] is a 73 y.o. year old male presented with left-sided weakness, right gaze deviation and dysarthria on 08/31/2018 with stroke work-up revealing right MCA infarct status post TPA and angioplasty right terminal ICA secondary to large vessel disease.  On 09/29/2018, patient presented to ED with seizure activity likely secondary to right MCA stroke.  Vascular risk factors include right ICA occlusion, prior stroke and TIA, HTN, HLD, advanced age and obesity.  Residual stroke deficits of left hemiparesis, dysphagia, dysarthria, left facial droop and cognitive deficits as reported by [name], NP during virtual visit has unable to assess/examination due to visit type and patient's cognitive impairment.  It was also reported by nursing staff and sister that he had full body twitching L>R that subsided after Ativan dosage on 03/09/2019 and 03/12/2019.  Recently treated with antibiotics due to stage IV sacral ulcer but no other active infection, illness or recent medication changes    PLAN:  1. Right MCA stroke: Continue clopidogrel 75 mg daily  and atorvastatin for secondary stroke prevention.   Maintain strict control of hypertension with blood pressure goal between 130-150, diabetes with hemoglobin A1c goal below 6.5% and cholesterol with LDL cholesterol (bad cholesterol) goal below 70 mg/dL.  I also advised the patient to eat a healthy diet with plenty of whole grains, cereals, fruits and vegetables, exercise regularly with at least 30 minutes of continuous activity daily and maintain ideal body weight. 2. Right ICA occlusion: Due to current clinical condition, vascular surgery recommended holding off on any additional procedures until improvement as he would likely not tolerate anesthesia.  Sister questions at what point would he be stable to proceed and she was advised that as he appears to be  in the same clinical  condition without improvement and with possible recurrent seizure activity, vascular surgery will likely not recommend pursuing at this time.  She was advised that this note will be sent to vascular surgery for update 3. Seizures, post stroke: Continue Keppra 1500 mg twice daily, Depakote 1000 mg twice daily and increase lacosamide to 200 mg twice daily due to recent seizure activity.  Discussion regarding monitoring of increased lethargy or worsening confusion and ongoing routine monitoring of lab work.  She was advised to notify office with any questions or concerns or reoccurring seizure activity.  We will hold off on obtaining EEG at this time due to COVID-19 restrictions but may need to proceed in the future if indicated.  As he is currently in a facility, request checking Depakote level with next lab work 4. HTN: Advised to continue current treatment regimen.   Advised to continue to monitor at home along with continued follow-up with PCP for management 5. HLD: Advised to continue current treatment regimen along with continued follow-up with PCP for future prescribing and monitoring of lipid panel 6. Residual deficits: Will likely not see significant improvement of residual deficits as he has not made any progress since prior visit and he has multiple comorbidities.  Would benefit from maintenance therapy for ongoing range of motion if tolerated   Follow up in 3 months or call earlier if needed   Greater than 50% of time during this 25 minute nonface-to-face visit was spent on counseling, explanation of diagnosis of right MCA stroke, reviewing risk factor management of right ICA occlusion, HTN, HLD, discussion regarding vascular surgery recommendations and inability to proceed with additional interventions due to current clinical state, discussion regarding likely recent seizure activity and increasing AED, planning of further management along with potential future management,  and answered all questions to sister satisfaction    Frann Rider, AGNP-BC  [facility] [address] [place], [place] [address]  Phone [phone] Fax [phone] Note: This document was prepared with digital dictation and possible smart phrase technology. Any transcriptional errors that result from this process are unintentional.

## 2020-01-28 DEATH — deceased

## 2020-04-29 IMAGING — MR MRA HEAD WITHOUT CONTRAST
9 of 11 series · 31 of 48 positions shown · non-contrast
Comparison: 08/31/[address] P and CT head. Prior brain MRI
05/24/2018.

CLINICAL DATA: 73-year-old male with code stroke presentation
yesterday, history of chronic right ICA and vertebral artery clues
*INSERT*, status post IV tPA and endovascular distal right ICA
reperfusion yesterday.

EXAM:
MRI HEAD WITHOUT CONTRAST
MRA HEAD WITHOUT CONTRAST
TECHNIQUE: Multiplanar, multiecho pulse sequences of the brain and surrounding
structures were obtained without intravenous contrast. Angiographic
images of the head were obtained using MRA technique without
contrast.

[Series 3: DWI · axial · 3.0mm · 1.09mm/px · z∈[-58,+101]mm · 7 of 108 slices shown (1 of 4)]
[im 1/108]
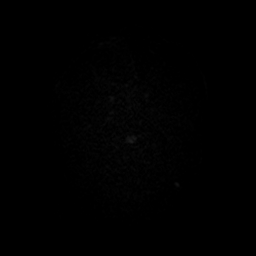
[im 18/108]
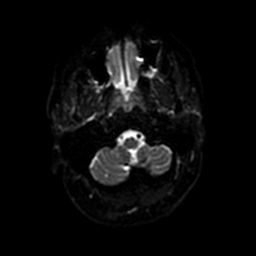
[im 36/108]
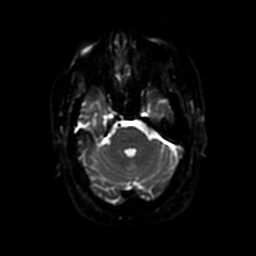
[im 54/108]
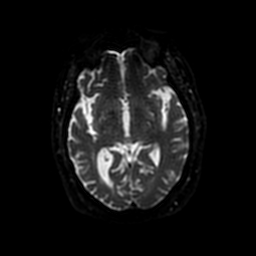
[im 72/108]
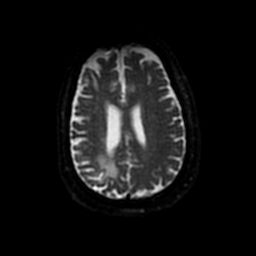
[im 90/108]
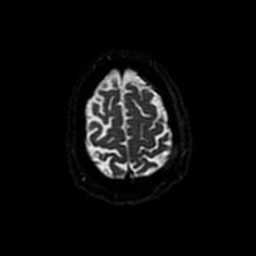
[im 108/108]
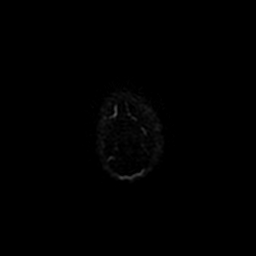

[Series 5: DWI · coronal · 5.0mm · 1.09mm/px · 6 of 78 slices shown (2 of 4)]
[im 1/78]
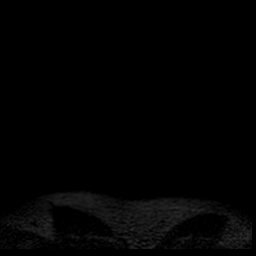
[im 16/78]
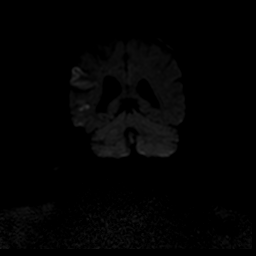
[im 31/78]
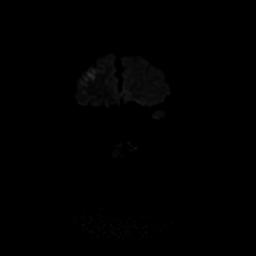
[im 47/78]
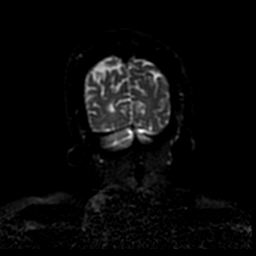
[im 62/78]
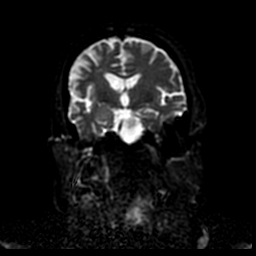
[im 78/78]
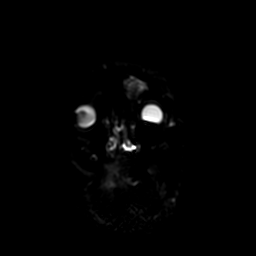

[Series 6: T1 · sagittal · 5.0mm · 0.47mm/px · 2 of 23 slices shown (1 of 2)]
[im 1/23]
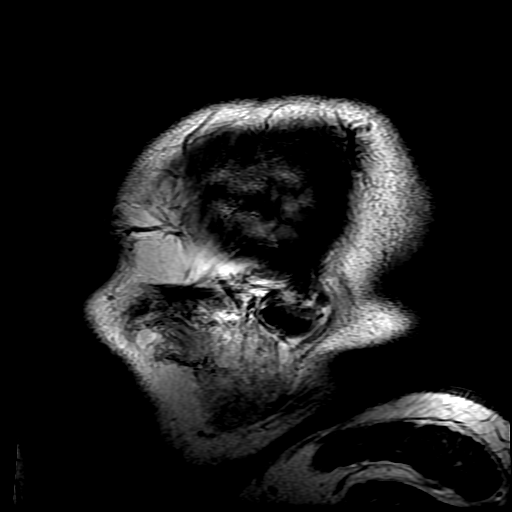
[im 23/23]
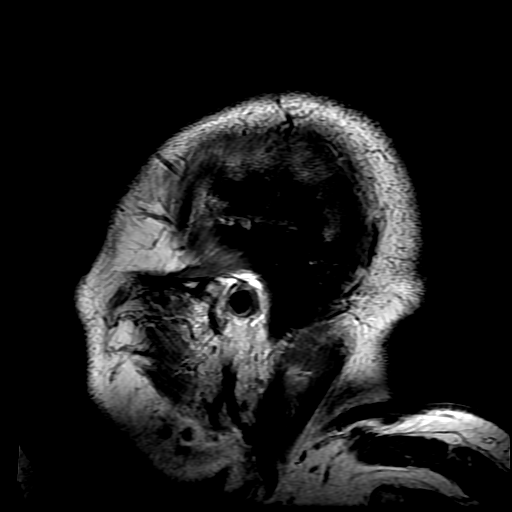

[Series 7: T2 · axial · 5.0mm · 0.43mm/px · z∈[-62,+88]mm · 2 of 26 slices shown (1 of 2)]
[im 1/26]
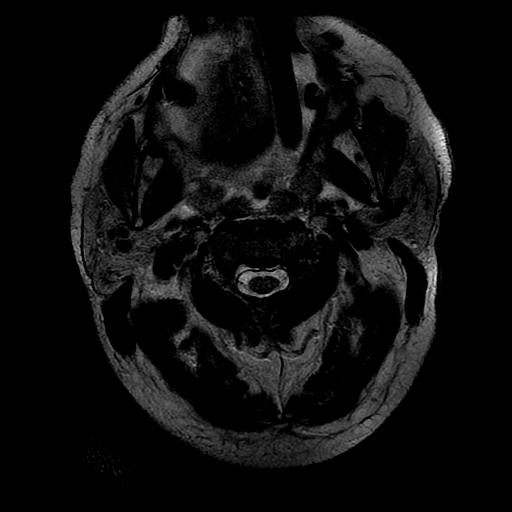
[im 26/26]
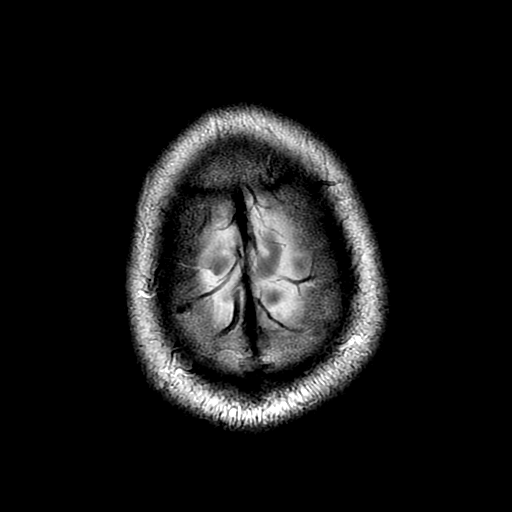

[Series 8: FLAIR · axial · 3.0mm · 0.43mm/px · z∈[-62,+88]mm · 2 of 26 slices shown]
[im 1/26]
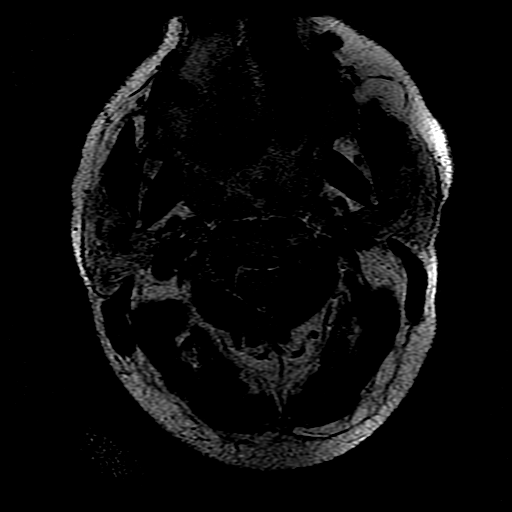
[im 26/26]
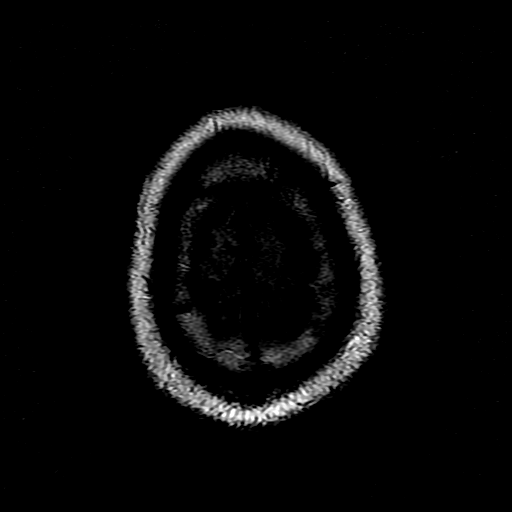

[Series 10: T1 · axial · 3.0mm · 0.45mm/px · z∈[-62,-19]mm · 3 of 104 slices shown (2 of 2)]
[im 1/104]
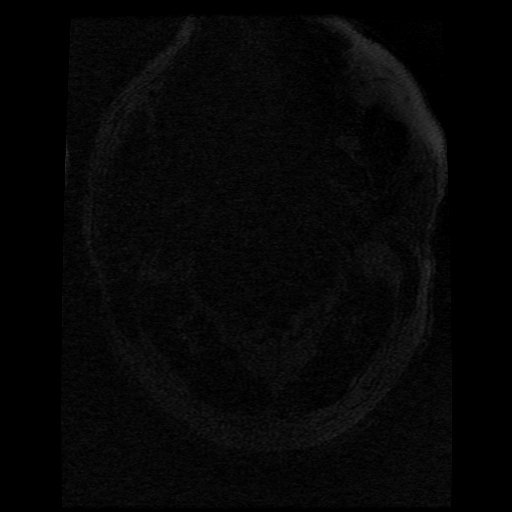
[im 15/104]
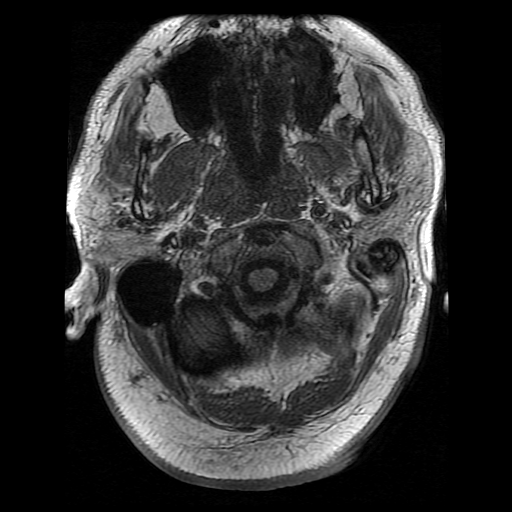
[im 30/104]
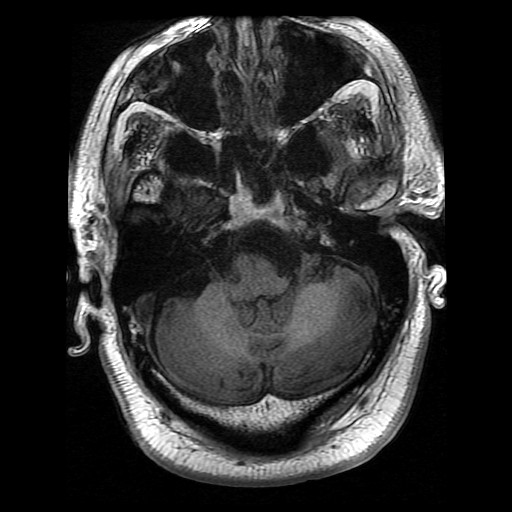

[Series 11: T2 · coronal · 5.0mm · 0.39mm/px · 2 of 25 slices shown (2 of 2)]
[im 1/25]
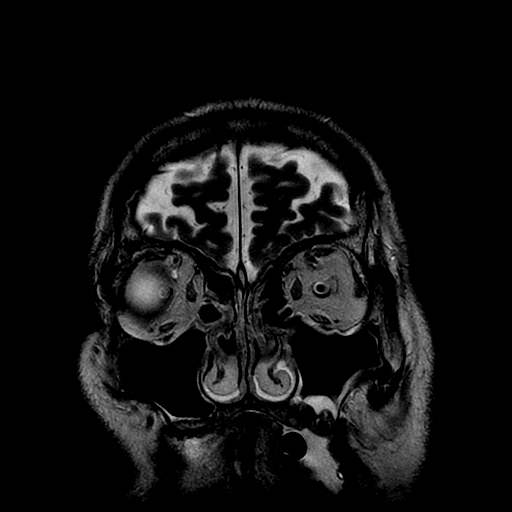
[im 25/25]
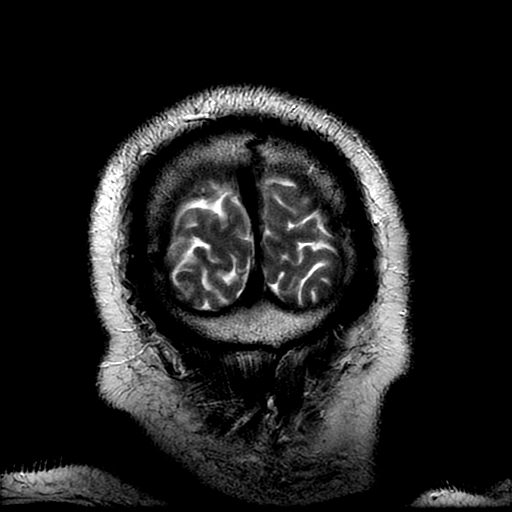

[Series 300: DWI · axial · 3.0mm · 1.09mm/px · z∈[-58,+101]mm · 4 of 54 slices shown (3 of 4)]
[im 1/54]
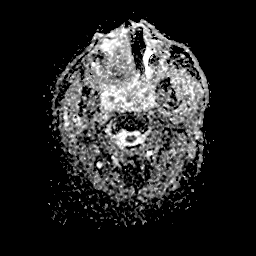
[im 18/54]
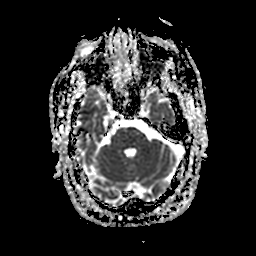
[im 36/54]
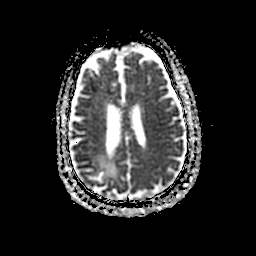
[im 54/54]
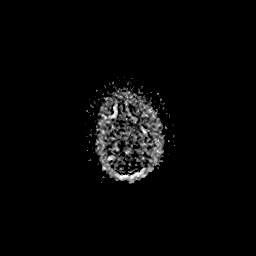

[Series 500: DWI · coronal · 5.0mm · 1.09mm/px · 3 of 37 slices shown (4 of 4)]
[im 1/37]
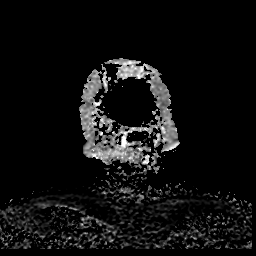
[im 19/37]
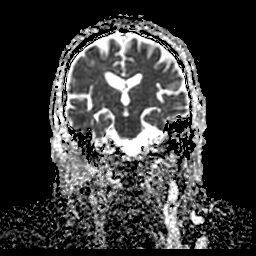
[im 37/37]
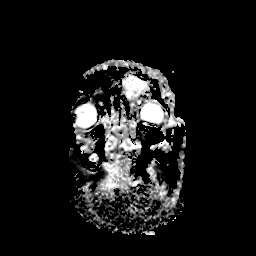

[31 of 48 positions shown; findings below may reference images not displayed]

FINDINGS: MRI HEAD FINDINGS

Brain: Gyriform restricted diffusion throughout the lateral aspect
of much of the right cerebral hemisphere from the temporal lobe
toward the vertex and including the insula. Associated T2 and FLAIR
hyperintensity. No evidence of hemorrhage on T2* imaging. No mass
effect.

Pre-existing right parietal and periatrial region encephalomalacia.
No deep gray matter nuclei, posterior fossa, or contralateral left
hemisphere restricted diffusion.

No midline shift, mass effect, evidence of mass lesion,
ventriculomegaly, extra-axial collection or acute intracranial
hemorrhage. Cervicomedullary junction and pituitary are within
normal limits.

Stable gray and white matter signal outside of the right hemisphere.
There are tiny chronic lacunar infarcts in the posterior right
cerebellum.

Vascular: The right ICA flow void is improved in the visible neck
and at the skull base compared to [place]. Stable other Major
intracranial vascular flow voids.

Skull and upper cervical spine: Negative visible cervical spine.
Normal bone marrow signal.

Sinuses/Orbits: Mild paranasal sinus fluid levels and mucosal
thickening. Negative orbits.

Other: Mastoids remain clear. Intubated. Small volume of fluid in
the visible pharynx.

MRA HEAD FINDINGS

Antegrade flow in the distal left vertebral artery with patent left
PICA origin. There is also faint flow signal in the right V4 segment
and right PICA origin. Antegrade flow in the basilar artery without
stenosis. Patent SCA and PCA origins. Left PCA branches are within
normal limits but there is loss of flow signal in the right PCA
beyond its origin. Normal left posterior communicating, the right
posterior communicating artery is diminutive or absent.

Antegrade flow in the left ICA siphon with no stenosis. Left
ophthalmic and posterior communicating artery origins are normal.
Left ICA terminus, MCA and ACA origins are normal. Diminutive or
absent anterior communicating artery. Bilateral ACA branches appear
within normal limits, including the right ACA A2 segment which
appeared stenotic by CTA yesterday (series 404, image 11). Left MCA
M1 and branches are within normal limits.

There is antegrade flow signal in the right ICA. There is segmental
loss of flow signal in the supraclinoid ICA is seen on series 404,
image 15, but the right ICA terminus appears patent. The right MCA
and ACA origins are patent. The right A1 is diminutive. The right
MCA M1, trifurcation, and visible right MCA branches are within
normal limits.
IMPRESSION: 1. Widespread gyriform restricted diffusion in the right hemisphere
compatible with cortical ischemia. No associated hemorrhage or mass
effect. No other areas of acute brain infarct.

2. Intracranial MRA reveals a diminutive but patent right ICA,
although focal loss of flow signal in the supraclinoid segment
raising the possibility of residual stenosis.

3. The visible right MCA and right ACA appear patent without
stenosis - including the right A2 segment which was thought to be
stenotic by CTA yesterday.

4. Poor flow in the right PCA, which demonstrated severe stenosis by
CTA yesterday. There is some faint flow signal in the right
vertebral artery V4 segment despite chronic right vertebral
occlusion.

## 2020-05-01 IMAGING — DX PORTABLE CHEST - 1 VIEW
1 series · 1 of 1 positions shown · non-contrast
Comparison: 09/01/2018

CLINICAL DATA: Respiratory failure.

EXAM:
PORTABLE CHEST 1 VIEW

[chest]
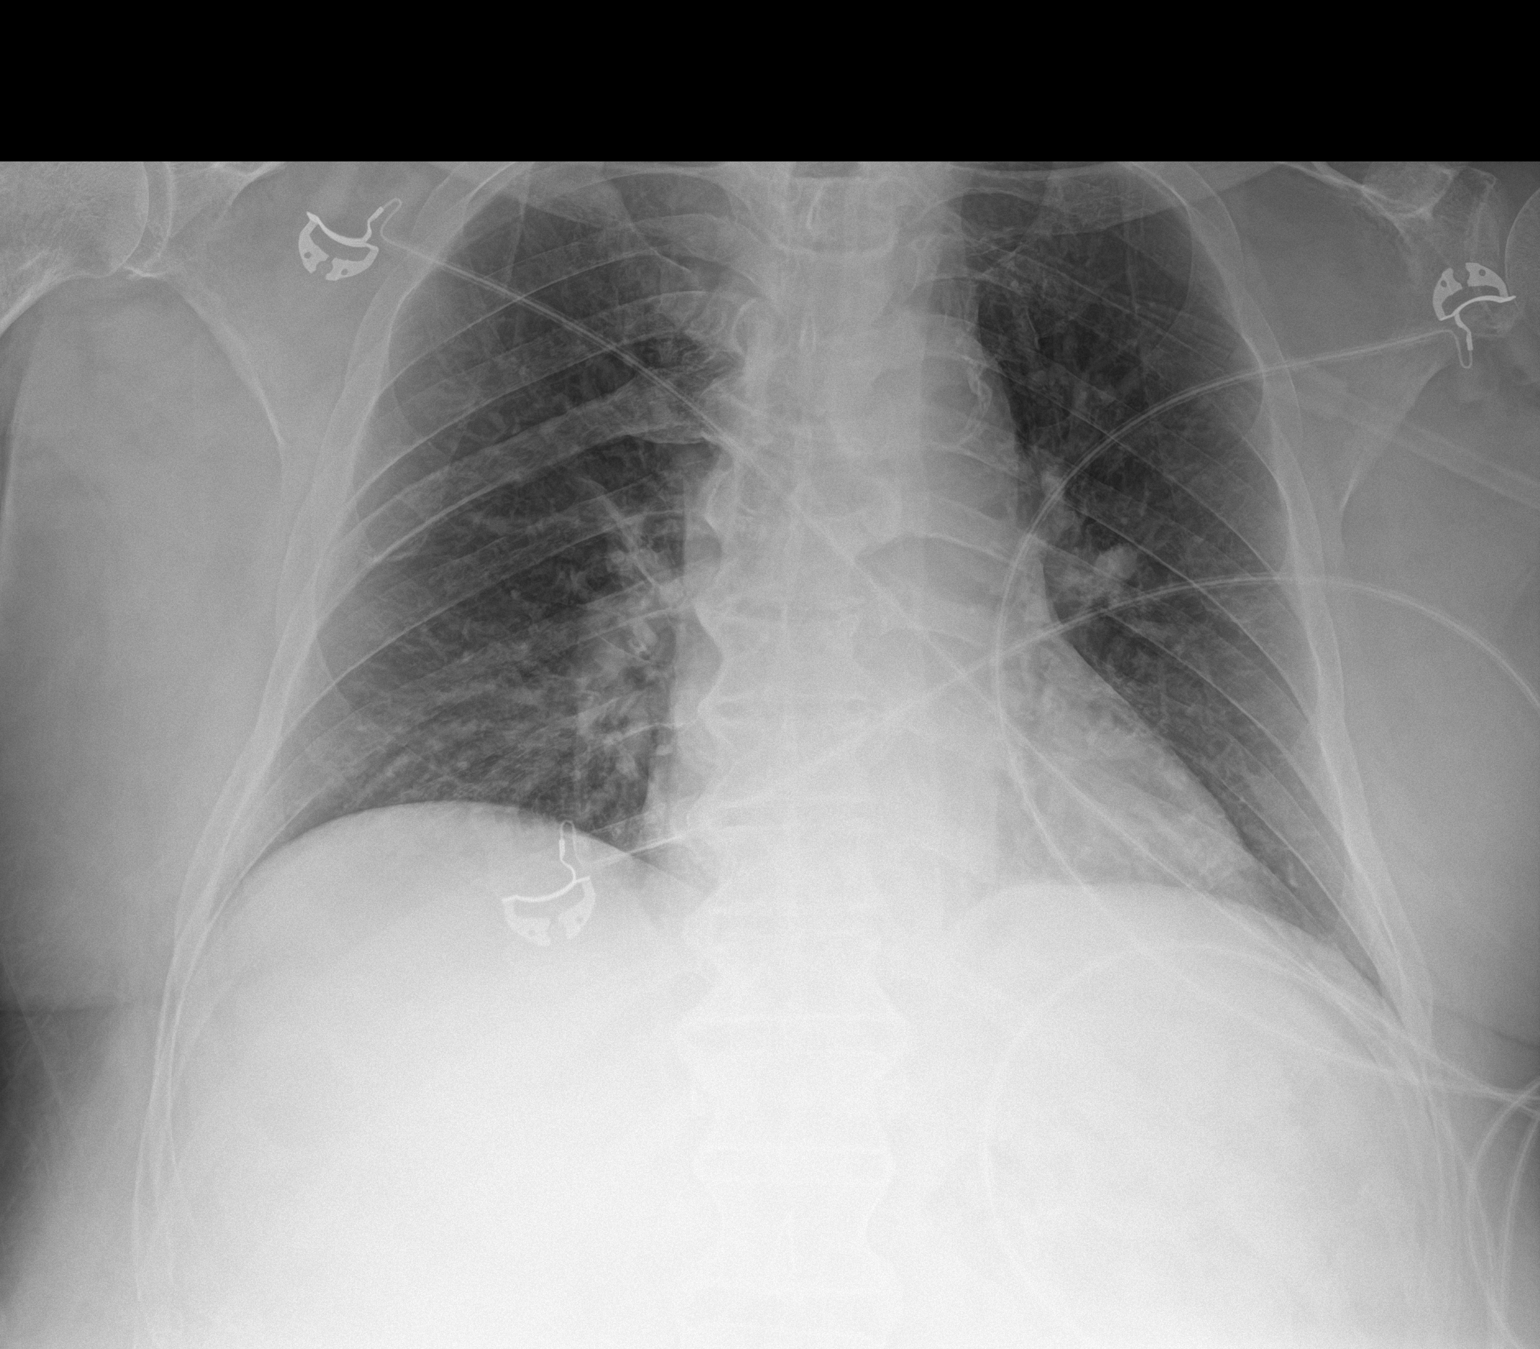

[1 of 1 positions shown; findings below may reference images not displayed]

FINDINGS: Endotracheal tube and nasogastric tube have been removed. Improved
aeration in the right lung. Lungs are clear except for linear
density in the right hilum that may represent atelectasis. Heart and
mediastinum are within normal limits. Atherosclerotic calcifications
at the aortic arch. Trachea is midline. Negative for a pneumothorax.
Bone structures are unremarkable.
IMPRESSION: Improved aeration in the right lung. Probable atelectasis in the
right hilum.
